# Patient Record
Sex: Male | Born: 1960
Health system: Southern US, Community
[De-identification: ages and names within clinical notes are randomized; demographics above are authoritative.]

## PROBLEM LIST (undated history)

## (undated) DIAGNOSIS — R911 Solitary pulmonary nodule: Secondary | ICD-10-CM

## (undated) DIAGNOSIS — I10 Essential (primary) hypertension: Secondary | ICD-10-CM

## (undated) DIAGNOSIS — I4891 Unspecified atrial fibrillation: Secondary | ICD-10-CM

## (undated) DIAGNOSIS — Z9989 Dependence on other enabling machines and devices: Secondary | ICD-10-CM

## (undated) DIAGNOSIS — I272 Pulmonary hypertension, unspecified: Secondary | ICD-10-CM

## (undated) DIAGNOSIS — R040 Epistaxis: Secondary | ICD-10-CM

## (undated) DIAGNOSIS — U071 COVID-19: Secondary | ICD-10-CM

## (undated) DIAGNOSIS — E119 Type 2 diabetes mellitus without complications: Secondary | ICD-10-CM

## (undated) DIAGNOSIS — J45909 Unspecified asthma, uncomplicated: Secondary | ICD-10-CM

## (undated) DIAGNOSIS — N183 Chronic kidney disease, stage 3 unspecified: Secondary | ICD-10-CM

## (undated) DIAGNOSIS — G4733 Obstructive sleep apnea (adult) (pediatric): Secondary | ICD-10-CM

## (undated) DIAGNOSIS — Z86718 Personal history of other venous thrombosis and embolism: Secondary | ICD-10-CM

## (undated) DIAGNOSIS — J9691 Respiratory failure, unspecified with hypoxia: Secondary | ICD-10-CM

## (undated) DIAGNOSIS — J449 Chronic obstructive pulmonary disease, unspecified: Secondary | ICD-10-CM

## (undated) DIAGNOSIS — I509 Heart failure, unspecified: Secondary | ICD-10-CM

## (undated) HISTORY — DX: Personal history of other venous thrombosis and embolism: Z86.718

## (undated) HISTORY — DX: Obstructive sleep apnea (adult) (pediatric): G47.33

## (undated) HISTORY — DX: Obstructive sleep apnea (adult) (pediatric): Z99.89

## (undated) HISTORY — DX: Respiratory failure, unspecified with hypoxia: J96.91

## (undated) HISTORY — PX: SHOULDER SURGERY: SHX246

## (undated) HISTORY — DX: Chronic kidney disease, stage 3 unspecified: N18.30

## (undated) HISTORY — DX: Type 2 diabetes mellitus without complications: E11.9

## (undated) HISTORY — DX: COVID-19: U07.1

## (undated) HISTORY — DX: Essential (primary) hypertension: I10

## (undated) HISTORY — DX: Epistaxis: R04.0

## (undated) HISTORY — DX: Solitary pulmonary nodule: R91.1

---

## 2016-05-21 LAB — POCT INR: INR: 3 — AB (ref 0.9–1.1)

## 2016-05-21 LAB — PROTIME-INR: PROTIME: 30.1 — AB (ref 10.0–13.8)

## 2017-04-13 ENCOUNTER — Emergency Department (HOSPITAL_COMMUNITY): Payer: Medicare Other

## 2017-04-13 ENCOUNTER — Encounter (HOSPITAL_COMMUNITY): Payer: Self-pay | Admitting: Emergency Medicine

## 2017-04-13 ENCOUNTER — Inpatient Hospital Stay (HOSPITAL_COMMUNITY)
Admission: EM | Admit: 2017-04-13 | Discharge: 2017-04-18 | DRG: 194 | Disposition: A | Discharge status: Home / Self Care | Payer: Medicare Other | Attending: Family Medicine | Admitting: Family Medicine

## 2017-04-13 DIAGNOSIS — E669 Obesity, unspecified: Secondary | ICD-10-CM | POA: Diagnosis present

## 2017-04-13 DIAGNOSIS — I509 Heart failure, unspecified: Secondary | ICD-10-CM | POA: Diagnosis present

## 2017-04-13 DIAGNOSIS — I5040 Unspecified combined systolic (congestive) and diastolic (congestive) heart failure: Secondary | ICD-10-CM

## 2017-04-13 DIAGNOSIS — Z6841 Body Mass Index (BMI) 40.0 and over, adult: Secondary | ICD-10-CM

## 2017-04-13 DIAGNOSIS — I82412 Acute embolism and thrombosis of left femoral vein: Secondary | ICD-10-CM | POA: Diagnosis present

## 2017-04-13 DIAGNOSIS — R0602 Shortness of breath: Secondary | ICD-10-CM | POA: Diagnosis present

## 2017-04-13 DIAGNOSIS — I959 Hypotension, unspecified: Secondary | ICD-10-CM | POA: Diagnosis present

## 2017-04-13 DIAGNOSIS — N179 Acute kidney failure, unspecified: Secondary | ICD-10-CM | POA: Diagnosis present

## 2017-04-13 DIAGNOSIS — I482 Chronic atrial fibrillation: Secondary | ICD-10-CM | POA: Diagnosis present

## 2017-04-13 DIAGNOSIS — J189 Pneumonia, unspecified organism: Principal | ICD-10-CM

## 2017-04-13 DIAGNOSIS — J440 Chronic obstructive pulmonary disease with acute lower respiratory infection: Secondary | ICD-10-CM | POA: Diagnosis present

## 2017-04-13 DIAGNOSIS — I82432 Acute embolism and thrombosis of left popliteal vein: Secondary | ICD-10-CM | POA: Diagnosis present

## 2017-04-13 DIAGNOSIS — I4891 Unspecified atrial fibrillation: Secondary | ICD-10-CM

## 2017-04-13 DIAGNOSIS — Z7901 Long term (current) use of anticoagulants: Secondary | ICD-10-CM

## 2017-04-13 DIAGNOSIS — J9611 Chronic respiratory failure with hypoxia: Secondary | ICD-10-CM

## 2017-04-13 DIAGNOSIS — I272 Pulmonary hypertension, unspecified: Secondary | ICD-10-CM | POA: Diagnosis present

## 2017-04-13 DIAGNOSIS — Z86711 Personal history of pulmonary embolism: Secondary | ICD-10-CM

## 2017-04-13 DIAGNOSIS — N189 Chronic kidney disease, unspecified: Secondary | ICD-10-CM | POA: Diagnosis present

## 2017-04-13 DIAGNOSIS — R911 Solitary pulmonary nodule: Secondary | ICD-10-CM | POA: Diagnosis present

## 2017-04-13 DIAGNOSIS — G4733 Obstructive sleep apnea (adult) (pediatric): Secondary | ICD-10-CM | POA: Diagnosis present

## 2017-04-13 DIAGNOSIS — E1122 Type 2 diabetes mellitus with diabetic chronic kidney disease: Secondary | ICD-10-CM | POA: Diagnosis present

## 2017-04-13 DIAGNOSIS — M79605 Pain in left leg: Secondary | ICD-10-CM

## 2017-04-13 DIAGNOSIS — I130 Hypertensive heart and chronic kidney disease with heart failure and stage 1 through stage 4 chronic kidney disease, or unspecified chronic kidney disease: Secondary | ICD-10-CM | POA: Diagnosis present

## 2017-04-13 DIAGNOSIS — I824Y2 Acute embolism and thrombosis of unspecified deep veins of left proximal lower extremity: Secondary | ICD-10-CM

## 2017-04-13 DIAGNOSIS — Z9981 Dependence on supplemental oxygen: Secondary | ICD-10-CM

## 2017-04-13 DIAGNOSIS — I5032 Chronic diastolic (congestive) heart failure: Secondary | ICD-10-CM | POA: Diagnosis present

## 2017-04-13 HISTORY — DX: Chronic obstructive pulmonary disease, unspecified: J44.9

## 2017-04-13 HISTORY — DX: Type 2 diabetes mellitus without complications: E11.9

## 2017-04-13 HISTORY — DX: Unspecified asthma, uncomplicated: J45.909

## 2017-04-13 HISTORY — DX: Essential (primary) hypertension: I10

## 2017-04-13 HISTORY — DX: Heart failure, unspecified: I50.9

## 2017-04-13 LAB — COMPREHENSIVE METABOLIC PANEL
ALT: 15 U/L — AB (ref 17–63)
AST: 23 U/L (ref 15–41)
Albumin: 4 g/dL (ref 3.5–5.0)
Alkaline Phosphatase: 75 U/L (ref 38–126)
Anion gap: 12 (ref 5–15)
BUN: 21 mg/dL — AB (ref 6–20)
CHLORIDE: 103 mmol/L (ref 101–111)
CO2: 27 mmol/L (ref 22–32)
CREATININE: 1.45 mg/dL — AB (ref 0.61–1.24)
Calcium: 9.1 mg/dL (ref 8.9–10.3)
GFR calc non Af Amer: 52 mL/min — ABNORMAL LOW (ref >60)
Glucose, Bld: 141 mg/dL — ABNORMAL HIGH (ref 65–99)
Potassium: 3.8 mmol/L (ref 3.5–5.1)
SODIUM: 142 mmol/L (ref 135–145)
Total Bilirubin: 1.2 mg/dL (ref 0.3–1.2)
Total Protein: 7.2 g/dL (ref 6.5–8.1)

## 2017-04-13 LAB — CBC WITH DIFFERENTIAL/PLATELET
BASOS ABS: 0.1 10*3/uL (ref 0.0–0.1)
BASOS PCT: 1 %
EOS ABS: 0.2 10*3/uL (ref 0.0–0.7)
EOS PCT: 3 %
HCT: 47.7 % (ref 39.0–52.0)
Hemoglobin: 14.6 g/dL (ref 13.0–17.0)
Lymphocytes Relative: 8 %
Lymphs Abs: 0.5 10*3/uL — ABNORMAL LOW (ref 0.7–4.0)
MCH: 28.2 pg (ref 26.0–34.0)
MCHC: 30.6 g/dL (ref 30.0–36.0)
MCV: 92.1 fL (ref 78.0–100.0)
Monocytes Absolute: 1 10*3/uL (ref 0.1–1.0)
Monocytes Relative: 17 %
Neutro Abs: 4.1 10*3/uL (ref 1.7–7.7)
Neutrophils Relative %: 71 %
PLATELETS: 193 10*3/uL (ref 150–400)
RBC: 5.18 MIL/uL (ref 4.22–5.81)
RDW: 16.2 % — ABNORMAL HIGH (ref 11.5–15.5)
WBC: 5.7 10*3/uL (ref 4.0–10.5)

## 2017-04-13 LAB — TROPONIN I

## 2017-04-13 LAB — BRAIN NATRIURETIC PEPTIDE: B Natriuretic Peptide: 274 pg/mL — ABNORMAL HIGH (ref 0.0–100.0)

## 2017-04-13 MED ORDER — DEXTROSE 5 % IV SOLN
500.0000 mg | Freq: Once | INTRAVENOUS | Status: AC
  Administered 2017-04-13: 500 mg via INTRAVENOUS
  Filled 2017-04-13: qty 500

## 2017-04-13 MED ORDER — ALBUTEROL SULFATE (2.5 MG/3ML) 0.083% IN NEBU: 5.0000 mg | INHALATION_SOLUTION | Freq: Once | RESPIRATORY_TRACT | Status: DC

## 2017-04-13 MED ORDER — DEXTROSE 5 % IV SOLN
1.0000 g | Freq: Once | INTRAVENOUS | Status: AC
  Administered 2017-04-13: 1 g via INTRAVENOUS
  Filled 2017-04-13: qty 10

## 2017-04-13 MED ORDER — IOPAMIDOL (ISOVUE-370) INJECTION 76%
INTRAVENOUS | Status: AC
  Administered 2017-04-13: 100 mL
  Filled 2017-04-13: qty 100

## 2017-04-13 MED ORDER — FUROSEMIDE 10 MG/ML IJ SOLN
40.0000 mg | INTRAMUSCULAR | Status: AC
  Administered 2017-04-13: 40 mg via INTRAVENOUS
  Filled 2017-04-13: qty 4

## 2017-04-13 NOTE — ED Provider Notes (Signed)
[facility] DEPT Provider Note   CSN: [id number] Arrival date & time: 04/13/17  1647     History   Chief Complaint Chief Complaint  Patient presents with  . Shortness of Breath  . Leg Pain    HPI Roberto [name] is a 56 y.o. male.  The history is provided by the patient and medical records.  Shortness of Breath  Associated symptoms include leg pain and leg swelling.  Leg Pain      56 year old male with history of CHF, COPD, diabetes, hypertension, history of DVT, presenting to the ED for shortness of breath. Patient uses home oxygen at baseline, 5 L at all times.  States over the past 3 days he has had increased shortness of breath and some pain in his left leg. States pain in posterior thigh and shooting down into the calf. States leg has been mildly red and warm to touch. He does report history of DVT in the leg. Unfortunately due to financial circumstances he has been off of his overall toe for about 9 months. States his prior insurance would not cover this medication and it was too expensive to pay for out of pocket. States he is now changed insurances, but his prescription has lapsed. He denies any chest pain. No fever but has had some chills/sweats at night.  No cough or other sick contacts. States all [place] were [place] in [place] but he has been living in [place] for about a month now since his daughter just had a baby.  Past Medical History:  Diagnosis Date  . CHF (congestive heart failure) ([place])   . COPD (chronic obstructive pulmonary disease) ([place])   . Diabetes mellitus without complication ([place])   . Hypertension     There are no active problems to display for this patient.   Past Surgical History:  Procedure Laterality Date  . SHOULDER SURGERY         Home Medications    Prior to Admission medications   Not on File    Family History No family history on file.  Social History Social History  Substance Use Topics  . Smoking  status: Never Smoker  . Smokeless tobacco: Never Used  . Alcohol use No     Allergies   Patient has no allergy information on record.   Review of Systems Review of Systems  Respiratory: Positive for shortness of breath.   Cardiovascular: Positive for leg swelling.  All other systems reviewed and are negative.    Physical Exam Updated Vital Signs BP 134/80 (BP Location: Left Arm)   Pulse 83   Temp 98.2 F (36.8 C) (Oral)   Resp 18   Ht 5' 9" (1.753 m)   Wt (!) 176.9 kg (390 lb)   SpO2 (!) 88%   BMI 57.59 kg/m   Physical Exam  Constitutional: He is oriented to person, place, and time. He appears well-developed and well-nourished.  obese  HENT:  Head: Normocephalic and atraumatic.  Mouth/Throat: Oropharynx is clear and moist.  Eyes: Pupils are equal, round, and reactive to light. Conjunctivae and EOM are normal.  Neck: Normal range of motion.  Cardiovascular: Normal rate, regular rhythm and normal heart sounds.   Pulmonary/Chest: Effort normal and breath sounds normal. No respiratory distress. He has no wheezes.  5L via [place] in place, O2 sats 93% during exam, speaking in sentences, no significant distress noted  Abdominal: Soft. Bowel sounds are normal. There is no tenderness. There is no rebound.  Musculoskeletal: Normal  range of motion.  Compression hose in place; edema noted of the lower legs; tenderness along posterior thigh  Neurological: He is alert and oriented to person, place, and time.  Skin: Skin is warm and dry.  Psychiatric: He has a normal mood and affect.  Nursing note and vitals reviewed.    ED Treatments / Results  Labs (all labs ordered are listed, but only abnormal results are displayed) Labs Reviewed  CBC WITH DIFFERENTIAL/PLATELET - Abnormal; Notable for the following:       Result Value   RDW 16.2 (*)    Lymphs Abs 0.5 (*)    All other components within normal limits  COMPREHENSIVE METABOLIC PANEL - Abnormal; Notable for the following:     Glucose, Bld 141 (*)    BUN 21 (*)    Creatinine, Ser 1.45 (*)    ALT 15 (*)    GFR calc non Af Amer 52 (*)    All other components within normal limits  BRAIN NATRIURETIC PEPTIDE - Abnormal; Notable for the following:    B Natriuretic Peptide 274.0 (*)    All other components within normal limits  TROPONIN I    EKG  EKG Interpretation None       Radiology Dg Chest 2 View  Result Date: 04/13/2017 CLINICAL DATA:  Shortness of breath. EXAM: CHEST  2 VIEW COMPARISON:  None. FINDINGS: The cardio pericardial silhouette is enlarged. There is pulmonary vascular congestion without overt pulmonary edema although a component of dependent interstitial edema towards the bases is suspected. Lateral film limited by underpenetration. No pleural effusion. The visualized bony structures of the thorax are intact. IMPRESSION: Enlargement of the cardiopericardial silhouette with vascular congestion and potential interstitial pulmonary edema. Electronically Signed   By: Misty Stanley M.D.   On: 04/13/2017 18:49   Ct Angio Chest Pe W And/or Wo Contrast  Result Date: 04/13/2017 CLINICAL DATA:  Shortness of breath x2 days, history of DVT, off Xarelto EXAM: CT ANGIOGRAPHY CHEST WITH CONTRAST TECHNIQUE: Multidetector CT imaging of the chest was performed using the standard protocol during bolus administration of intravenous contrast. Multiplanar CT image reconstructions and MIPs were obtained to evaluate the vascular anatomy. CONTRAST:  120 mL Isovue 370 IV COMPARISON:  Chest radiographs dated 04/13/2017 FINDINGS: Cardiovascular: Suboptimal contrast opacification of the bilateral pulmonary arteries. Initial scan is not diagnostic. Repeat evaluation is notable for adequate contrast opacification to the level of the lobar pulmonary arteries. No evidence of central or lobar pulmonary embolism. Segmental or subsegmental pulmonary embolism cannot be excluded. Cardiomegaly.  Small pericardial effusion. Mild enlargement of  the main pulmonary artery, suggesting pulmonary arterial hypertension. No evidence of thoracic aortic aneurysm or dissection. Mediastinum/Nodes: Small mediastinal lymph nodes measuring up to 18 mm short axis (series 4/ image 20), likely reactive. Visualized thyroid is unremarkable. Lungs/Pleura: Patchy opacity in the posterior right upper lobe (series 16/ image 41), possibly reflecting mild pneumonia, less likely mosaic attenuation. Mild dependent atelectasis in the posterior left lower lobe. 7 mm right lower lobe pulmonary nodule (series 16/ image 67). No pleural effusion or pneumothorax. Upper Abdomen: Visualized upper abdomen is notable for mild hepatic steatosis. Musculoskeletal: Degenerative changes of the lower thoracic spine. Review of the MIP images confirms the above findings. IMPRESSION: Limited evaluation with suboptimal contrast opacification of the bilateral pulmonary arteries. No evidence of central or lobar pulmonary embolism. Segmental or subsegmental pulmonary embolism cannot be excluded. Mild patchy opacity in the posterior right upper lobe, possibly reflecting mild pneumonia. 7 mm right lower  lobe pulmonary nodule. Non-contrast chest CT at 6-12 months is recommended. If the nodule is stable at time of repeat CT, then future CT at 18-24 months (from today's scan) is considered optional for low-risk patients, but is recommended for high-risk patients. This recommendation follows the consensus statement: Guidelines for Management of Incidental Pulmonary Nodules Detected on CT Images: From the Fleischner Society 2017; Radiology 2017; 284:228-243. Cardiomegaly. Small pericardial effusion. Suspected pulmonary arterial hypertension. Electronically Signed   By: Julian Hy M.D.   On: 04/13/2017 21:38    Procedures Procedures (including critical care time)  Medications Ordered in ED Medications  albuterol (PROVENTIL) (2.5 MG/3ML) 0.083% nebulizer solution 5 mg (5 mg Nebulization Not Given  04/13/17 2314)  furosemide (LASIX) injection 40 mg (not administered)  cefTRIAXone (ROCEPHIN) 1 g in dextrose 5 % 50 mL IVPB (not administered)  azithromycin (ZITHROMAX) 500 mg in dextrose 5 % 250 mL IVPB (not administered)  iopamidol (ISOVUE-370) 76 % injection (100 mLs  Contrast Given 04/13/17 2027)     Initial Impression / Assessment and Plan / ED Course  I have reviewed the triage vital signs and the nursing notes.  Pertinent labs & imaging results that were available during my care of the patient were reviewed by me and considered in my medical decision making (see chart for details).  56 y.o. M here with increased SOB.  Uses 5L O2 at baseline.  Hx of DVT, off xarelto for about 9 months due to finances.  Also reports some left leg pain.  No recent illness, cough, fever, chills.  On exam he is obese and does appear to have some significant peripheral edema.  He does wear compression stockings daily.  No significant respiratory distress.  O2 around 93% on his baseline 5L.  Screening labs overall reassuring.  CXR with vascular congestion and enlargement of cardiac silhouette.  Given he has been off his anticoagulation for several months, obvious concern for PE causing CHF vs primary CHF.  Will add on BNP, troponin.  Plan for CTA chest.  CTA negative for l;arge PE but study somewhat limited.  There is infiltrate seen concerning for pneumonia-- patient has had some chills and sweats at home. On reassessment patient's oxygen saturation still only 93% on his baseline 5 L just sitting in bed. Given his CHF and questionable pneumonia, feel he would benefit from diuresis and hospitalization. He was ordered additional Lasix and antibiotics for possible CAP He continues to have some left leg pain, unable to obtain venous duplex so will arrange for this in the morning.  Discussed with family practice service-- they will admit.  Final Clinical Impressions(s) / ED Diagnoses   Final diagnoses:  Shortness of  breath  Acute on chronic congestive heart failure, unspecified heart failure type ([place])  Left leg pain    New Prescriptions New Prescriptions   No medications on file     Kathryne Hitch 04/13/17 2323    Lajean Saver, MD 04/13/17 [phone]

## 2017-04-13 NOTE — ED Triage Notes (Signed)
Pt reports LLE pain x 3 days, reports area warm to touch and tender.  Pt reports new SOB this morning, denies cough or CP.  Pt reports hx DVT, reports being off Xarelto x 9 months, states, "I just couldn't afford it." Pt reports living on 5L O2.

## 2017-04-13 NOTE — H&P (Signed)
[facility] Admission History and Physical Service Pager: [phone]  Patient name: Roberto [name] Medical record number: [id number] Date of birth: 01-02-1961 Age: 56 y.o. Gender: male  Primary Care Provider: Dhivianathan, Candida Peeling, MD Consultants: none Code Status: Full (obtained in admission)  Chief Complaint: shortness of breath and leg pain  Assessment and Plan: Roberto [name] is a 56 y.o. male presenting with one day history of shortness of breath. Laboratory findings and imaging consistent shortness of breath being caused by chf exacerbation. Given patients leg pain and history of dvts, a small PE undetectably by CTA is also on the differential. Other potential causes include pneumonia, copd exacerbation, and viral uri. These are all unlikely due to low wbc, afebrile, lack of physical exam findings, or time course inconsistent with each diagnosis.  PMH is significant for asthma, chf, copd, t2dm, osa, dvt in left leg, afib, htn  Shortness of breath likely secondary to CHF exacerbation given CXR, elevated BNP (even with obesity) and improvement of symptoms with IV lasix. Admits dietary indiscretion.  Patient is back to baseline on 5L [place]. Patient with diffuse cardiopulmonary congestion on ct and cxr. Fluid in fissures noted bilaterally along with characteristic kerly b lines. Patient with some improvement after receiving one dose 73m IV lasix. Will watch urine output with strict I/os and daily weights while in hospital. Patient formerly took spironolactone but was taken off of this medication in recent past for unknown reasons. Other differential items to consider are pneumonia, PE, copd exacerbation/asthma exacerbation, viral uri, afib. Given patient's wbc of 5 and normal temps while in the ed it is unlikely that pneumonia or URI is causing his symptoms. Given leg pain, history of PE and patient stopping anticoagulant PE is certainly a consideration. Given ct scan  with no evidence of PE this diagnosis is less likely but still a consideration. Patient with afib on ekg, it is possible this could have been causing his sob earlier. Given resolution of symptoms with patient still in afib, this is less likely. Patient with no wheezing on exam which makes asthma and copd unlikely. - admit to telemetry for observation to dr. EGwendlyn Deutscher- vitals per  floor - Monitor respiratory status - continue home o2 - strict I/O, monitor daily weights - continue home torsemide in am - BLE duplex ultrasound in am - echocardiogram in am - pt/ot eval and treat - continue home amlodipine in the morning - heparin gtt for afib, possible blood clot, pharmacy to dose - received one dose rocephin/azithromax.  - cbc, bmp in am - Repeat EKG in am  Abnormal EKG: Afib, RAD, TWI in precordial and inferior leads. No prior EKG to compare to. Patient without chest pain but dyspnea. Initial trop negative. -Cycle trop to rule out ACS -Morning EKG -A. Fib as below  Atrial fibrillation w/o RVR: chad-vasc score 6. Not on rate or rhythm control. Previously on Xarelto but not sure if it was for this or DVT or both. Denies history of GI bleed. Patient with afib on ekg, and possible explanation for sob. On heparin gtt while in hospital. Will plan to start xarelto on discharge,  possibly in the morning. Will continue to monitor vitals. If indicated will start medication to rate control. - Cardiac monitor - holding home meds - heparin gtt - monitor vitals  CHF: Patient's shortness of breath thought to be due to chf exacerbation. Received 1 dose iv lasix. Some crackles over RLL. Will likely restart home diuretic in am. Echo  in am to determine EF and fluid status. Not sure about dry weight - strict I/O - continue home o2 - daily weights - Heart healthy/diabetic diet diet - Echo in am  Left Leg pain: no calf tenderness on exam. CTA without massive PE.  - prn tylenol for pain - BLE venous duplex  in am - echo in am - pt/ot to ambulate - Heparin gtt, pharmacy to dose  T2DM: Unknown A1c. Glucose of 141 on admission. Will monitor q achs blood glucose. Will hold home metformin and glyburide. Start on moderate ssi - moderate ssi - blood glucose q ac/hs - obtain hgb a1c  ?Pnemonia: had chills and diaphoresis but no fever. Exam with some crackles over RLL posteriorly. CTA with mild pneumonia in RLL. Received CTX and Azithromycin in ED. -Switch to Levaquin  Asthma/copd: Patient takes no medication at home. Never smoker. Will continue to monitor respiratory status. Possibly start albuterol of symptomatic.  -PRN dounebs  Hypertension Patient with diagnosis of htn. Normotensive since admission. Last bp 139/88. Will hold home blood pressure meds at current time. Received one dose of iv lasix.  OSA/?OHS Uses cpap at night. Will give cpap while in hospital  H/O DVT Will receive echo and BLE duplex. Will plan to restart xarelto on discharge, possibly in the morning, as patient's insurance can apparently now pay for this medication  Lung nodule: about 7 mm solid appearing nodule noted in right lung on CTA. No further characterization. Follow up CT at 18-24 mos recommended.   FEN/GI: heart healthy/diabetic diet  Prophylaxis: on heparin gtt  Disposition: home  History of Present Illness:  Roberto [name] is a 56 y.o. male presenting with shortness of breath and left leg pain. Per the patient he started having progressive shortness of breath beginning on the morning of 7/20. He said that he would have increased work of breathing doing routine tasks such as walking from the parking lot into a building. He is usually on 5L [place] at home, and states that this amount wasn't allowing him to breath normally. He is only short of breath while performing activity and returns to baseline when resting. He states that he usually requires a couple of pillows to sleep at night, and if he doesn't use these he  sometimes wakes up short of breath. He has chronic non-productive cough and states that he has been coughing out clear phlegm over the last few days.  Patient states that he has been having progressive left leg pain for the last 3-4 days. He describes the pain as "just an annoying pain". The pain was initially relieved with aleve but became progressively resistant to NSAIDS. He states that the pain is present when he is at rest, but that it moves in location when he moves around. It is normally located in his mid-thigh of his left leg. It moves to a radiating type pain from his inguinal area to his left knee when standing. Of note the patient has a history of DVTs and he feels like this pain is similar to that. He was supposed to be on xarelto for life, but stopped taking it around 9 months ago due to his insurance not covering it. He took aspirin 653m 2 days ago because he was concerned about dvt pain.  Other notable history is some profound night sweats. He states over the last 3-4 nights he has been soaking through his sheets, but notes that he is always very hot at night. He had one episode 2  nights ago when he started shaking, and had very heaving sweating at night. He took an aleve which alleviated his symptoms. Workup thus far includes ekg in er which showed afib, ct scan which revealed no large PE in lungs, chest xray consistent with fluid overnight, bnp of 274, trop <.03. He had a bmp which was notable for cr 1.45, glucose 141. A cbc was obtained which was normal with wbc 5.7, hgb 14.6. He believes that his breathing has returned to baseline. Received prevnar vaccine in September. He uses cpap at night for osa. He has had some subjectively decreased urination recently. He does not take any medications for his copd. Patient received 1 dose of rocephin and azithromycin in ed. He received 1 dose 34m IV lasix in ed.  Review Of Systems: Per HPI with the following additions:  Review of Systems   Constitutional: Positive for chills and diaphoresis. Negative for fever and weight loss.  HENT: Negative for sore throat.   Eyes: Negative for blurred vision, photophobia and pain.  Respiratory: Positive for cough and shortness of breath.   Cardiovascular: Negative for chest pain, palpitations and leg swelling.  Gastrointestinal: Negative for abdominal pain, blood in stool and vomiting.  Genitourinary: Negative for dysuria.  Musculoskeletal: Negative for myalgias.  Skin: Negative for rash.  Neurological: Negative for speech change, focal weakness, weakness and headaches.  Endo/Heme/Allergies: Does not bruise/bleed easily.  Psychiatric/Behavioral: Negative for depression and substance abuse. The patient is not nervous/anxious.     There are no active problems to display for this patient.   Past Medical History: Past Medical History:  Diagnosis Date  . Asthma   . CHF (congestive heart failure) ([place]   . COPD (chronic obstructive pulmonary disease) ([facility] [facility]   . Diabetes mellitus without complication ([facility]   . Hypertension   Atrial fibrillation. Had cardioversion few years ago.  OSA DVT  Past Surgical History: Past Surgical History:  Procedure Laterality Date  . SHOULDER SURGERY      Social History: Social History  Substance Use Topics  . Smoking status: Never Smoker  . Smokeless tobacco: Never Used  . Alcohol use No  Denies drug use.  Additional social history[place] with [place] daughter.  Please also refer to relevant sections of EMR.  Family History: Younger brother died of breathing issue Mother from cancer.   Allergies and Medications: No Known Allergies No current facility-administered medications on file prior to encounter.    No current outpatient prescriptions on file prior to encounter.    Objective: BP 114/87   Pulse 78   Temp 98.2 F (36.8 C) (Oral)   Resp (!) 21   Ht _0  (1.753 m)   Wt (!) 390 lb (176.9 kg)    SpO2 92%   BMI 57.59 kg/m  Exam: General: no acute distress, at edge of bed, able to converse with no difficulty Eyes: eomi, perrl, no jaundice ENTM: good dentition, no signs of trauma Neck: supple, no adenopathy Cardiovascular:  low to normal rate, irregular rythm, regular rate, regular rhythm by auscultation. Palpable bilateral radial pulses. Unable to palpate BLE pulses due to body habitus/edema/lipidema Respiratory: on 5L by [place], speaks in full sentences,  no IWOB,  lungs clear to auscultation bilaterally. No wheezes. No chest pain. Crackles over RLL posteriorly Gastrointestinal: soft, non-distended, non-tender. Morbid obesity MSK: able to move all four extremities. 5/5 muscle strength all groups BUE/BLE. No Calf tenderness.  Derm: skin changes consistent with chronic venous  stasis BLE & lipidemia. No increased warmth to touch.  Neuro: aox4, no neuro deficits noted Psych: appropriate  Labs and Imaging: CBC BMET   Recent Labs Lab 04/13/17 1701  WBC 5.7  HGB 14.6  HCT 47.7  PLT 193    Recent Labs Lab 04/13/17 1701  NA 142  K 3.8  CL 103  CO2 27  BUN 21*  CREATININE 1.45*  GLUCOSE 141*  CALCIUM 9.1     Dg Chest 2 View  Result Date: 04/13/2017 CLINICAL DATA:  Shortness of breath. EXAM: CHEST  2 VIEW COMPARISON:  None. FINDINGS: The cardio pericardial silhouette is enlarged. There is pulmonary vascular congestion without overt pulmonary edema although a component of dependent interstitial edema towards the bases is suspected. Lateral film limited by underpenetration. No pleural effusion. The visualized bony structures of the thorax are intact. IMPRESSION: Enlargement of the cardiopericardial silhouette with vascular congestion and potential interstitial pulmonary edema. Electronically Signed   By: Misty Stanley M.D.   On: 04/13/2017 18:49   Ct Angio Chest Pe W And/or Wo Contrast  Result Date: 04/13/2017 CLINICAL DATA:  Shortness of breath x2 days, history of DVT, off  Xarelto EXAM: CT ANGIOGRAPHY CHEST WITH CONTRAST TECHNIQUE: Multidetector CT imaging of the chest was performed using the standard protocol during bolus administration of intravenous contrast. Multiplanar CT image reconstructions and MIPs were obtained to evaluate the vascular anatomy. CONTRAST:  120 mL Isovue 370 IV COMPARISON:  Chest radiographs dated 04/13/2017 FINDINGS: Cardiovascular: Suboptimal contrast opacification of the bilateral pulmonary arteries. Initial scan is not diagnostic. Repeat evaluation is notable for adequate contrast opacification to the level of the lobar pulmonary arteries. No evidence of central or lobar pulmonary embolism. Segmental or subsegmental pulmonary embolism cannot be excluded. Cardiomegaly.  Small pericardial effusion. Mild enlargement of the main pulmonary artery, suggesting pulmonary arterial hypertension. No evidence of thoracic aortic aneurysm or dissection. Mediastinum/Nodes: Small mediastinal lymph nodes measuring up to 18 mm short axis (series 4/ image 20), likely reactive. Visualized thyroid is unremarkable. Lungs/Pleura: Patchy opacity in the posterior right upper lobe (series 16/ image 41), possibly reflecting mild pneumonia, less likely mosaic attenuation. Mild dependent atelectasis in the posterior left lower lobe. 7 mm right lower lobe pulmonary nodule (series 16/ image 67). No pleural effusion or pneumothorax. Upper Abdomen: Visualized upper abdomen is notable for mild hepatic steatosis. Musculoskeletal: Degenerative changes of the lower thoracic spine. Review of the MIP images confirms the above findings. IMPRESSION: Limited evaluation with suboptimal contrast opacification of the bilateral pulmonary arteries. No evidence of central or lobar pulmonary embolism. Segmental or subsegmental pulmonary embolism cannot be excluded. Mild patchy opacity in the posterior right upper lobe, possibly reflecting mild pneumonia. 7 mm right lower lobe pulmonary nodule.  Non-contrast chest CT at 6-12 months is recommended. If the nodule is stable at time of repeat CT, then future CT at 18-24 months (from today's scan) is considered optional for low-risk patients, but is recommended for high-risk patients. This recommendation follows the consensus statement: Guidelines for Management of Incidental Pulmonary Nodules Detected on CT Images: From the Fleischner Society 2017; Radiology 2017; 284:228-243. Cardiomegaly. Small pericardial effusion. Suspected pulmonary arterial hypertension. Electronically Signed   By: Julian Hy M.D.   On: 04/13/2017 21:38    Guadalupe Dawn MD 04/13/2017, 10:37 PM PGY-1, [place] Intern pager: [phone], text pages welcome  I have seen and evaluated the patient with Dr. Kris Mouton. I am in agreement with the note above in its revised form. My  additions are in red.  Wendee Beavers, MD, PGY-2 04/14/2017 1:41 AM

## 2017-04-13 NOTE — ED Notes (Signed)
Nurse currently starting IV and will draw labs.

## 2017-04-14 ENCOUNTER — Other Ambulatory Visit: Payer: Self-pay

## 2017-04-14 ENCOUNTER — Observation Stay (HOSPITAL_COMMUNITY): Payer: Medicare Other

## 2017-04-14 DIAGNOSIS — I509 Heart failure, unspecified: Secondary | ICD-10-CM | POA: Diagnosis present

## 2017-04-14 DIAGNOSIS — M79605 Pain in left leg: Secondary | ICD-10-CM

## 2017-04-14 DIAGNOSIS — I482 Chronic atrial fibrillation: Secondary | ICD-10-CM | POA: Diagnosis present

## 2017-04-14 DIAGNOSIS — Z6841 Body Mass Index (BMI) 40.0 and over, adult: Secondary | ICD-10-CM | POA: Diagnosis not present

## 2017-04-14 DIAGNOSIS — R0602 Shortness of breath: Secondary | ICD-10-CM | POA: Diagnosis present

## 2017-04-14 DIAGNOSIS — I272 Pulmonary hypertension, unspecified: Secondary | ICD-10-CM | POA: Diagnosis present

## 2017-04-14 DIAGNOSIS — J9611 Chronic respiratory failure with hypoxia: Secondary | ICD-10-CM | POA: Diagnosis present

## 2017-04-14 DIAGNOSIS — Z7901 Long term (current) use of anticoagulants: Secondary | ICD-10-CM | POA: Diagnosis not present

## 2017-04-14 DIAGNOSIS — R911 Solitary pulmonary nodule: Secondary | ICD-10-CM | POA: Diagnosis present

## 2017-04-14 DIAGNOSIS — Z86711 Personal history of pulmonary embolism: Secondary | ICD-10-CM | POA: Diagnosis not present

## 2017-04-14 DIAGNOSIS — N179 Acute kidney failure, unspecified: Secondary | ICD-10-CM | POA: Diagnosis present

## 2017-04-14 DIAGNOSIS — I4891 Unspecified atrial fibrillation: Secondary | ICD-10-CM

## 2017-04-14 DIAGNOSIS — N189 Chronic kidney disease, unspecified: Secondary | ICD-10-CM | POA: Diagnosis present

## 2017-04-14 DIAGNOSIS — I5040 Unspecified combined systolic (congestive) and diastolic (congestive) heart failure: Secondary | ICD-10-CM

## 2017-04-14 DIAGNOSIS — Z86718 Personal history of other venous thrombosis and embolism: Secondary | ICD-10-CM | POA: Diagnosis not present

## 2017-04-14 DIAGNOSIS — M79609 Pain in unspecified limb: Secondary | ICD-10-CM

## 2017-04-14 DIAGNOSIS — I824Y2 Acute embolism and thrombosis of unspecified deep veins of left proximal lower extremity: Secondary | ICD-10-CM | POA: Diagnosis not present

## 2017-04-14 DIAGNOSIS — I959 Hypotension, unspecified: Secondary | ICD-10-CM | POA: Diagnosis present

## 2017-04-14 DIAGNOSIS — Z9981 Dependence on supplemental oxygen: Secondary | ICD-10-CM | POA: Diagnosis not present

## 2017-04-14 DIAGNOSIS — G4733 Obstructive sleep apnea (adult) (pediatric): Secondary | ICD-10-CM | POA: Diagnosis present

## 2017-04-14 DIAGNOSIS — I130 Hypertensive heart and chronic kidney disease with heart failure and stage 1 through stage 4 chronic kidney disease, or unspecified chronic kidney disease: Secondary | ICD-10-CM | POA: Diagnosis present

## 2017-04-14 DIAGNOSIS — J440 Chronic obstructive pulmonary disease with acute lower respiratory infection: Secondary | ICD-10-CM | POA: Diagnosis present

## 2017-04-14 DIAGNOSIS — J189 Pneumonia, unspecified organism: Principal | ICD-10-CM

## 2017-04-14 DIAGNOSIS — E669 Obesity, unspecified: Secondary | ICD-10-CM | POA: Diagnosis present

## 2017-04-14 DIAGNOSIS — E1122 Type 2 diabetes mellitus with diabetic chronic kidney disease: Secondary | ICD-10-CM | POA: Diagnosis present

## 2017-04-14 DIAGNOSIS — I5032 Chronic diastolic (congestive) heart failure: Secondary | ICD-10-CM | POA: Diagnosis present

## 2017-04-14 DIAGNOSIS — I361 Nonrheumatic tricuspid (valve) insufficiency: Secondary | ICD-10-CM | POA: Diagnosis not present

## 2017-04-14 DIAGNOSIS — I82412 Acute embolism and thrombosis of left femoral vein: Secondary | ICD-10-CM | POA: Diagnosis present

## 2017-04-14 DIAGNOSIS — I82432 Acute embolism and thrombosis of left popliteal vein: Secondary | ICD-10-CM | POA: Diagnosis present

## 2017-04-14 LAB — GLUCOSE, CAPILLARY
GLUCOSE-CAPILLARY: 100 mg/dL — AB (ref 65–99)
GLUCOSE-CAPILLARY: 125 mg/dL — AB (ref 65–99)
Glucose-Capillary: 122 mg/dL — ABNORMAL HIGH (ref 65–99)
Glucose-Capillary: 115 mg/dL — ABNORMAL HIGH (ref 65–99)
Glucose-Capillary: 106 mg/dL — ABNORMAL HIGH (ref 65–99)

## 2017-04-14 LAB — CBC
HCT: 43.9 % (ref 39.0–52.0)
Hemoglobin: 13.5 g/dL (ref 13.0–17.0)
MCH: 28.4 pg (ref 26.0–34.0)
MCHC: 30.8 g/dL (ref 30.0–36.0)
MCV: 92.2 fL (ref 78.0–100.0)
PLATELETS: 191 10*3/uL (ref 150–400)
RBC: 4.76 MIL/uL (ref 4.22–5.81)
RDW: 16.3 % — ABNORMAL HIGH (ref 11.5–15.5)
WBC: 5.4 10*3/uL (ref 4.0–10.5)

## 2017-04-14 LAB — TROPONIN I
Troponin I: <0.03 ng/mL (ref <0.03)
Troponin I: <0.03 ng/mL (ref <0.03)
Troponin I: <0.03 ng/mL (ref <0.03)

## 2017-04-14 LAB — BASIC METABOLIC PANEL
ANION GAP: 11 (ref 5–15)
BUN: 20 mg/dL (ref 6–20)
CHLORIDE: 99 mmol/L — AB (ref 101–111)
CO2: 30 mmol/L (ref 22–32)
Calcium: 8.7 mg/dL — ABNORMAL LOW (ref 8.9–10.3)
Creatinine, Ser: 1.39 mg/dL — ABNORMAL HIGH (ref 0.61–1.24)
GFR calc Af Amer: >60 mL/min (ref >60)
GFR, EST NON AFRICAN AMERICAN: 55 mL/min — AB (ref >60)
GLUCOSE: 114 mg/dL — AB (ref 65–99)
POTASSIUM: 3.8 mmol/L (ref 3.5–5.1)
Sodium: 140 mmol/L (ref 135–145)

## 2017-04-14 LAB — HEPARIN LEVEL (UNFRACTIONATED): Heparin Unfractionated: 0.25 IU/mL — ABNORMAL LOW (ref 0.30–0.70)

## 2017-04-14 LAB — TSH: TSH: 1.523 u[IU]/mL (ref 0.350–4.500)

## 2017-04-14 MED ORDER — HEPARIN BOLUS VIA INFUSION
6000.0000 [IU] | Freq: Once | INTRAVENOUS | Status: AC
  Administered 2017-04-14: 6000 [IU] via INTRAVENOUS
  Filled 2017-04-14: qty 6000

## 2017-04-14 MED ORDER — TRAMADOL HCL 50 MG PO TABS
50.0000 mg | ORAL_TABLET | Freq: Four times a day (QID) | ORAL | Status: DC | PRN
  Administered 2017-04-14 – 2017-04-18 (×7): 50 mg via ORAL
  Filled 2017-04-14 (×7): qty 1

## 2017-04-14 MED ORDER — INSULIN ASPART 100 UNIT/ML ~~LOC~~ SOLN
0.0000 [IU] | Freq: Three times a day (TID) | SUBCUTANEOUS | Status: DC
  Administered 2017-04-16 – 2017-04-18 (×3): 2 [IU] via SUBCUTANEOUS

## 2017-04-14 MED ORDER — ACETAMINOPHEN 325 MG PO TABS
650.0000 mg | ORAL_TABLET | Freq: Four times a day (QID) | ORAL | Status: DC | PRN
  Administered 2017-04-14 – 2017-04-18 (×4): 650 mg via ORAL
  Filled 2017-04-14 (×4): qty 2

## 2017-04-14 MED ORDER — RIVAROXABAN 20 MG PO TABS
20.0000 mg | ORAL_TABLET | Freq: Every day | ORAL | Status: DC
  Administered 2017-04-14: 20 mg via ORAL
  Filled 2017-04-14: qty 1

## 2017-04-14 MED ORDER — AZITHROMYCIN 250 MG PO TABS
250.0000 mg | ORAL_TABLET | Freq: Every day | ORAL | Status: DC
  Administered 2017-04-14 – 2017-04-17 (×4): 250 mg via ORAL
  Filled 2017-04-14 (×4): qty 1

## 2017-04-14 MED ORDER — TORSEMIDE 20 MG PO TABS
40.0000 mg | ORAL_TABLET | Freq: Every day | ORAL | Status: DC
  Administered 2017-04-14 – 2017-04-17 (×4): 40 mg via ORAL
  Filled 2017-04-14 (×5): qty 2

## 2017-04-14 MED ORDER — IPRATROPIUM-ALBUTEROL 0.5-2.5 (3) MG/3ML IN SOLN
3.0000 mL | RESPIRATORY_TRACT | Status: DC | PRN
  Administered 2017-04-16: 3 mL via RESPIRATORY_TRACT
  Filled 2017-04-14: qty 3

## 2017-04-14 MED ORDER — ONDANSETRON HCL 4 MG/2ML IJ SOLN: 4.0000 mg | Freq: Four times a day (QID) | INTRAMUSCULAR | Status: DC | PRN

## 2017-04-14 MED ORDER — HEPARIN (PORCINE) IN NACL 100-0.45 UNIT/ML-% IJ SOLN
1600.0000 [IU]/h | INTRAMUSCULAR | Status: DC
  Administered 2017-04-14: 1600 [IU]/h via INTRAVENOUS
  Filled 2017-04-14: qty 250

## 2017-04-14 NOTE — Procedures (Signed)
Placed patient on CPAP for the night.  Patient is tolerating well at this time. 

## 2017-04-14 NOTE — Progress Notes (Signed)
Family Medicine Teaching Service Daily Progress Note Intern Pager: [phone]  Patient name: Roberto [name] Medical record number: [id number] Date of birth: [birth year] Age: 56 y.o. Gender: male  Primary Care Provider: Dhivianathan, Candida Peeling, MD Consultants: none Code Status: full  Pt Overview and Major Events to Date:  07/20: admitted with SOB and leg pain  Assessment and Plan: Roberto [name] is a 56 y.o. male  admitted with dyspnea and left leg pain. PMH is significant for asthma/copd, chf, copd, t2dm, osa, dvt in left leg, afib, htn  Dyspnea: Improved. Dyspnea is mainly exertional likely due to CHF exacerbation given CXR, elevated BNP (even with obesity) and improvement of symptoms with IV lasix. Admits dietary indiscretion. About 2 L urine output overnight after a dose of IV Lasix.  - Resume home torsemide - strict I/O, monitor daily weights - echocardiogram  - hold home lisinopril - Follow-up cbc, bmp - Follow-up repeat EKG - Continue cardiac monitor - Continue home oxygen (5 L)  CHF: As above  Abnormal EKG: Afib, RAD, TWI in precordial and inferior leads. No prior EKG to compare to. Patient without chest pain but dyspnea. Troponin negative x2 -Cycle trop to rule out ACS -Follow-up repeat EKG -A. Fib as below  Atrial fibrillation w/o RVR: chad-vasc score 6. Not on rate or rhythm control. Previously on Xarelto  Denies history of GI bleed.  - Switch to Xarelto today - Cardiac monitor - monitor vitals - Not sure if he tolerates beta blocker for rate control  Pneumonia: had chills and diaphoresis but no fever. Exam with some crackles over RLL posteriorly. CTA with mild pneumonia in RLL. Received CTX and azithromycin ED. -Azithromycin for 4 more days.  AKI/?CKD: Serum creatinine 1.45 on admission. Unsure of his baseline. -Daily BMP -Hold home lisinopril  Left Leg pain/Hx of DVT's: he says he has DVT more than 2 times in his life. No calf tenderness on exam. CTA without  massive PE.  - BLE venous duplex in am - PT eval - Switch to Xarelto today  T2DM: CBG 122 this morning.  - SSI-moderate - CBG before meals and at bedtime - A1c  Asthma/COPD: Doubt if he had formal diagnosis for this.  Never smoker. -PRN dounebs  Hypertension: Normotensive  -Hold home lisinopril and amlodipine  OSA/?OHS -Nightly CPAP  Lung nodule: 7 mm solid-appearing nodule noted in the right lung on CTA. Follow up CT at 18-24 months recommended. Discussed this with the patient.  FEN/GI: heart healthy/diabetic diet  Prophylaxis: on full anticoagulation  Disposition: Continue inpatient management pending further workup and clinical improvement. CM consulted for PCP/hospital follow-up.   Subjective:  No acute events overnight. Shortness of breath improved. Continues to endorse leg pain.  Objective: Temp:  [95 F (35 C)-99 F (37.2 C)] 95 F (35 C) (07/21 0207) Pulse Rate:  [49-111] 64 (07/21 0207) Resp:  [14-29] 18 (07/21 0207) BP: (104-139)/(65-91) 104/65 (07/21 0120) SpO2:  [86 %-95 %] 95 % (07/21 0207) Weight:  [390 lb (176.9 kg)-392 lb 3.2 oz (177.9 kg)] 392 lb 3.2 oz (177.9 kg) (07/21 0120) Physical Exam: General: Appears well. Sitting on the edge of the bed eating breakfast. Cardiovascular: Normal rate. Irregularly irregular.  Respiratory: On 5 L by [place], no IWB, mild crackles over RLL posteriorly. Abdomen: Limited exam due to morbid obesity. No tenderness to palpation. Bowel sounds present. Extremities: Significant for venous stasis bilaterally. Lipidemia.   Laboratory:  Recent Labs Lab 04/13/17 1701 04/14/17 0715  WBC 5.7 5.4  HGB 14.6 13.5  HCT 47.7 43.9  PLT 193 191    Recent Labs Lab 04/13/17 1701 04/14/17 0715  NA 142 140  K 3.8 3.8  CL 103 99*  CO2 27 30  BUN 21* 20  CREATININE 1.45* 1.39*  CALCIUM 9.1 8.7*  PROT 7.2  --   BILITOT 1.2  --   ALKPHOS 75  --   ALT 15*  --   AST 23  --   GLUCOSE 141* 114*    Imaging/Diagnostic  Tests: CT negative for massive PE but with mild pneumonia and small lung node  Mercy Riding, MD 04/14/2017, 8:45 AM PGY-3, [place] Intern pager: [phone], text pages welcome

## 2017-04-14 NOTE — Progress Notes (Signed)
Patient states that he will place CPAP on when ready. RT will continue to monitor as needed.

## 2017-04-14 NOTE — Evaluation (Signed)
Physical Therapy Evaluation Patient Details Name: Roberto [name] MRN: [id number] DOB: 07/16/1961 Today's Date: 04/14/2017   History of Present Illness  Roberto [name] is a 56 y.o. male  admitted with dyspnea and left leg pain. Positive for DVT left LE confirmed this pm.  PMH is significant for asthma/copd, chf, copd, t2dm, osa, dvt in left leg, afib, htn  Clinical Impression  Pt admitted with above diagnosis. Pt currently with functional limitations due to the deficits listed below (see PT Problem List). Pt was able to ambulate in room but did not go out of room due to pt's doppler wasn't back and still work up for DVT as well as pt desat to 82% on RA with activity. Pt steady on feet overall.   Will follow acutely for increasing pt endurance.  Pt does state that he wants to join [employer] when he leaves hospital and this PT told himthat would be good just get restrictions from MD.  Pt agrees.   Pt will benefit from skilled PT to increase their independence and safety with mobility to allow discharge to the venue listed below.      Follow Up Recommendations No PT follow up;Supervision - Intermittent    Equipment Recommendations  None recommended by PT    Recommendations for Other Services       Precautions / Restrictions Precautions Precautions: Fall Restrictions Weight Bearing Restrictions: No      Mobility  Bed Mobility Overal bed mobility: Independent             General bed mobility comments: Pt was up in chair on arrival.   Transfers Overall transfer level: Independent                  Ambulation/Gait Ambulation/Gait assistance: Supervision Ambulation Distance (Feet): 65 Feet Assistive device: None Gait Pattern/deviations: Step-through pattern;Decreased stride length;Wide base of support   Gait velocity interpretation: Below normal speed for age/gender General Gait Details: No issues wtih gait stability.  endurance poor with desat to 82% on 5LO2 with  activity.  At rest on 5LO2 88-89%.  Stairs            Wheelchair Mobility    Modified Rankin (Stroke Patients Only)       Balance Overall balance assessment: Needs assistance Sitting-balance support: No upper extremity supported;Feet supported Sitting balance-Leahy Scale: Good     Standing balance support: No upper extremity supported;During functional activity Standing balance-Leahy Scale: Fair                               Pertinent Vitals/Pain Pain Assessment: 0-10 Pain Score: 4  Pain Location: left LE Pain Descriptors / Indicators: Aching;Grimacing Pain Intervention(s): Limited activity within patient's tolerance;Monitored during session;Repositioned    Home Living Family/patient expects to be discharged to:: Private residence Living Arrangements: Children (staying with daughter until he is better) Available Help at Discharge: Family;Available PRN/intermittently Type of Home: House Home Access: Stairs to enter Entrance Stairs-Rails: Left Entrance Stairs-Number of Steps: 7 Home Layout: One level Home Equipment: None      Prior Function Level of Independence: Independent               Hand Dominance        Extremity/Trunk Assessment   Upper Extremity Assessment Upper Extremity Assessment: Defer to OT evaluation    Lower Extremity Assessment Lower Extremity Assessment: Generalized weakness    Cervical / Trunk Assessment Cervical / Trunk Assessment: Normal  Communication   Communication: No difficulties  Cognition Arousal/Alertness: Awake/alert Behavior During Therapy: WFL for tasks assessed/performed Overall Cognitive Status: Within Functional Limits for tasks assessed                                        General Comments      Exercises     Assessment/Plan    PT Assessment Patient needs continued PT services  PT Problem List Decreased activity tolerance;Decreased balance;Decreased mobility;Decreased  knowledge of use of DME;Decreased safety awareness;Decreased knowledge of precautions;Cardiopulmonary status limiting activity;Pain       PT Treatment Interventions DME instruction;Gait training;Functional mobility training;Therapeutic activities;Therapeutic exercise;Balance training;Patient/family education;Stair training    PT Goals (Current goals can be found in the Care Plan section)  Acute Rehab PT Goals Patient Stated Goal: to go home PT Goal Formulation: With patient Time For Goal Achievement: 04/28/17 Potential to Achieve Goals: Good Additional Goals Additional Goal #1: Pt to maintain sats>90% on RA with activity.     Frequency Min 3X/week   Barriers to discharge        Co-evaluation               AM-PAC PT "6 Clicks" Daily Activity  Outcome Measure Difficulty turning over in bed (including adjusting bedclothes, sheets and blankets)?: None Difficulty moving from lying on back to sitting on the side of the bed? : None Difficulty sitting down on and standing up from a chair with arms (e.g., wheelchair, bedside commode, etc,.)?: None Help needed moving to and from a bed to chair (including a wheelchair)?: None Help needed walking in hospital room?: A Little Help needed climbing 3-5 steps with a railing? : A Little 6 Click Score: 22    End of Session Equipment Utilized During Treatment: Gait belt;Oxygen Activity Tolerance: Patient limited by fatigue Patient left: in chair;with call bell/phone within reach Nurse Communication: Mobility status PT Visit Diagnosis: Muscle weakness (generalized) (M62.81);Pain Pain - Right/Left: Left Pain - part of body: Leg    Time: 8719-9412 PT Time Calculation (min) (ACUTE ONLY): 16 min   Charges:   PT Evaluation $PT Eval Low Complexity: 1 Procedure     PT G Codes:   PT G-Codes **NOT FOR INPATIENT CLASS** Functional Assessment Tool Used: AM-PAC 6 Clicks Basic Mobility Functional Limitation: Mobility: Walking and moving  around Mobility: Walking and Moving Around Current Status (R0475): At least 1 percent but less than 20 percent impaired, limited or restricted Mobility: Walking and Moving Around Goal Status [id number]): At least 1 percent but less than 20 percent impaired, limited or restricted    [place] [phone] (pager)   Denice Paradise 04/14/2017, 4:43 PM

## 2017-04-14 NOTE — Progress Notes (Signed)
ANTICOAGULATION CONSULT NOTE - Initial Consult  Pharmacy Consult for Heparin  Indication: atrial fibrillation and history of DVT  No Known Allergies  Patient Measurements: Height: _0  (175.3 cm) Weight: (!) 392 lb 3.2 oz (177.9 kg) IBW/kg (Calculated) : 70.7 Vital Signs: Temp: 99 F (37.2 C) (07/21 0120) Temp Source: Oral (07/21 0120) BP: 104/65 (07/21 0120) Pulse Rate: 69 (07/21 0120)  Labs:  Recent Labs  04/13/17 1701 04/13/17 1941  HGB 14.6  --   HCT 47.7  --   PLT 193  --   CREATININE 1.45*  --   TROPONINI  --  <0.03    Estimated Creatinine Clearance: 91.4 mL/min (A) (by C-G formula based on SCr of 1.45 mg/dL (H)).   Medical History: Past Medical History:  Diagnosis Date  . Asthma   . CHF (congestive heart failure) ([place])   . COPD (chronic obstructive pulmonary disease) ([place])   . Diabetes mellitus without complication ([place])   . Hypertension     Assessment: 56 y/o M here with shortness of breath and leg pain. Pt also noted to be in afib. Pt also has hx DVT but stopped taking Xarelto several months ago due to insurance reasons. CBC good, noted bump in Scr.   Goal of Therapy:  Heparin level 0.3-0.7 units/ml Monitor platelets by anticoagulation protocol: Yes   Plan:  Heparin 6000 units BOLUS Start heparin drip at 1600 units/hr 1000 HL Daily CBC/HL Monitor for bleeding   Narda Bonds 04/14/2017,1:31 AM

## 2017-04-14 NOTE — Progress Notes (Signed)
Pt. Arrived to the unit in alert and stable condition via stretcher. Pt. Able to ambulate from stretcher to bed. CCMD notified of pts. Arrival to the unit. Pt. Placed on telemetry and oriented to the room. Call light placed within reach. RN will continue to monitor pt. For changes in condition. Roberto [name], Roberto [name]

## 2017-04-14 NOTE — Progress Notes (Addendum)
ANTICOAGULATION CONSULT NOTE  Pharmacy Consult for Heparin >>xarelto Indication: atrial fibrillation and history of DVT  No Known Allergies  Patient Measurements: Height: 5' 9" (175.3 cm) Weight: (!) 392 lb 3.2 oz (177.9 kg) IBW/kg (Calculated) : 70.7 Vital Signs: Temp: 95 F (35 C) (07/21 0207) Temp Source: Oral (07/21 0120) BP: 104/65 (07/21 0120) Pulse Rate: 64 (07/21 0207)  Labs:  Recent Labs  04/13/17 1701 04/13/17 1941 04/14/17 0315 04/14/17 0715  HGB 14.6  --   --  13.5  HCT 47.7  --   --  43.9  PLT 193  --   --  191  HEPARINUNFRC  --   --   --  0.25*  CREATININE 1.45*  --   --  1.39*  TROPONINI  --  <0.03 <0.03 <0.03    Estimated Creatinine Clearance: 95.3 mL/min (A) (by C-G formula based on SCr of 1.39 mg/dL (H)).   Medical History: Past Medical History:  Diagnosis Date  . Asthma   . CHF (congestive heart failure) ([place])   . COPD (chronic obstructive pulmonary disease) ([place])   . Diabetes mellitus without complication ([place])   . Hypertension     Assessment: 56 y/o M here with shortness of breath and leg pain. Pt also noted to be in afib. Pt also has hx DVT but stopped taking Xarelto several months ago due to insurance reasons. CBC good, noted bump in Scr.   New orders this morning to transition patient back to xarelto. He is above recommended weight at 177kg, renal function is ok for full dose xarelto. Will consult case management to investigate whether he can afford xarelto now given history of noncompliance.  Goal of Therapy:  Heparin level 0.3-0.7 units/ml Monitor platelets by anticoagulation protocol: Yes   Plan:  Stop heparin and start xarelto 65m this morning, futher doses with dinner  FErin HearingPharmD., BCPS Clinical Pharmacist Pager [phone]/21/2018 8:55 AM

## 2017-04-14 NOTE — Progress Notes (Signed)
Patient stable during 7 a to 7 pshift, sat up in chair for much of shift.  Patient having pain in left leg off and on, Tylenol administered first then due to continued pain notified MD who ordered Tramadol, patient expressed more relief with Tramadol.  Son and grandchild at bedside for part of shift.

## 2017-04-14 NOTE — Progress Notes (Signed)
*  PRELIMINARY RESULTS* Vascular Ultrasound Lower extremity venous duplex has been completed.  Preliminary findings: technically limited due to body habitus. Appears to be DVT in the left mid-distal femoral vein and popliteal vein. No obvious DVT RLE. Right baker's cyst noted.   Called results to [facility], [place], RDMS, RVT  04/14/2017, 11:25 AM

## 2017-04-14 NOTE — Progress Notes (Signed)
Notified by vascular that patient is positive for DVT on the left, already on Xarelto.  Notified family medicine provider via text page.

## 2017-04-15 ENCOUNTER — Inpatient Hospital Stay (HOSPITAL_COMMUNITY): Payer: Medicare Other

## 2017-04-15 DIAGNOSIS — I361 Nonrheumatic tricuspid (valve) insufficiency: Secondary | ICD-10-CM

## 2017-04-15 DIAGNOSIS — I824Y2 Acute embolism and thrombosis of unspecified deep veins of left proximal lower extremity: Secondary | ICD-10-CM

## 2017-04-15 LAB — GLUCOSE, CAPILLARY
GLUCOSE-CAPILLARY: 144 mg/dL — AB (ref 65–99)
GLUCOSE-CAPILLARY: 123 mg/dL — AB (ref 65–99)
Glucose-Capillary: 113 mg/dL — ABNORMAL HIGH (ref 65–99)
Glucose-Capillary: 110 mg/dL — ABNORMAL HIGH (ref 65–99)

## 2017-04-15 LAB — BASIC METABOLIC PANEL
Anion gap: 8 (ref 5–15)
BUN: 25 mg/dL — AB (ref 6–20)
CHLORIDE: 103 mmol/L (ref 101–111)
CO2: 30 mmol/L (ref 22–32)
Calcium: 8.6 mg/dL — ABNORMAL LOW (ref 8.9–10.3)
Creatinine, Ser: 1.37 mg/dL — ABNORMAL HIGH (ref 0.61–1.24)
GFR calc Af Amer: >60 mL/min (ref >60)
GFR, EST NON AFRICAN AMERICAN: 56 mL/min — AB (ref >60)
GLUCOSE: 130 mg/dL — AB (ref 65–99)
POTASSIUM: 4.4 mmol/L (ref 3.5–5.1)
Sodium: 141 mmol/L (ref 135–145)

## 2017-04-15 LAB — ECHOCARDIOGRAM COMPLETE
HEIGHTINCHES: 69 in
WEIGHTICAEL: 6259.2 [oz_av]

## 2017-04-15 MED ORDER — RIVAROXABAN 15 MG PO TABS
15.0000 mg | ORAL_TABLET | Freq: Two times a day (BID) | ORAL | Status: DC
  Administered 2017-04-15 – 2017-04-18 (×7): 15 mg via ORAL
  Filled 2017-04-15 (×7): qty 1

## 2017-04-15 MED ORDER — RIVAROXABAN 20 MG PO TABS
20.0000 mg | ORAL_TABLET | Freq: Every day | ORAL | Status: DC
  Administered 2017-04-18: 20 mg via ORAL

## 2017-04-15 NOTE — Progress Notes (Signed)
ANTICOAGULATION CONSULT NOTE  Pharmacy Consult for Heparin >>xarelto Indication: DVT  No Known Allergies  Patient Measurements: Height: _0  (175.3 cm) Weight: (!) 391 lb 3.2 oz (177.4 kg) IBW/kg (Calculated) : 70.7 Vital Signs: Temp: 97.8 F (36.6 C) (07/22 0622) Temp Source: Oral (07/22 0622) BP: 96/55 (07/22 0848) Pulse Rate: 60 (07/22 0848)  Labs:  Recent Labs  04/13/17 1701  04/14/17 0315 04/14/17 0715 04/14/17 1313 04/15/17 0209  HGB 14.6  --   --  13.5  --   --   HCT 47.7  --   --  43.9  --   --   PLT 193  --   --  191  --   --   HEPARINUNFRC  --   --   --  0.25*  --   --   CREATININE 1.45*  --   --  1.39*  --  1.37*  TROPONINI  --   < > <0.03 <0.03 <0.03  --   < > = values in this interval not displayed.  Estimated Creatinine Clearance: 96.6 mL/min (A) (by C-G formula based on SCr of 1.37 mg/dL (H)).   Medical History: Past Medical History:  Diagnosis Date  . Asthma   . CHF (congestive heart failure) ([place])   . COPD (chronic obstructive pulmonary disease) ([place])   . Diabetes mellitus without complication ([place])   . Hypertension     Assessment: 56 y/o M here with SOB and leg pain. Pt also noted to be in afib. Pt also has hx DVT but stopped taking Xarelto several months ago due to insurance reasons. CBC good, noted bump in Scr.   New orders this morning to transition patient back to xarelto. He is above recommended weight at 177kg, renal function is ok for full dose xarelto. Will consult case management to investigate whether he can afford xarelto now given history of noncompliance.  DVT found on LE doppler.  Goal of Therapy:  Monitor platelets by anticoagulation protocol: Yes   Plan:  DVT tx - Xarelto 15 mg BID with meals x 21 days, then Xarelto 20 mg daily with dinner  Angus Seller, PharmD Pharmacy Resident Pager: [phone] 04/15/2017 11:46 AM

## 2017-04-15 NOTE — Progress Notes (Signed)
CCMD called stating patient was bradycardiac in the 20's and 30's not sustained.  RN notified family medicine.  Patient is asymptomatic, says he has had this happen before on prior admissions.

## 2017-04-15 NOTE — Discharge Summary (Signed)
[facility] Discharge Summary  Patient name: Roberto [name] Medical record number: [id number] Date of birth: 1961-07-30 Age: 56 y.o. Gender: male Date of Admission: 04/13/2017  Date of Discharge: 04/18/2017 Admitting Physician: Kinnie Feil, MD  Primary Care Provider: Mikey Bussing Candida Peeling, MD Consultants: None  Indication for Hospitalization: SOB, LLE Pain  Discharge Diagnoses/Problem List:  Patient Active Problem List   Diagnosis Date Noted  . Chronic respiratory failure with hypoxia ([place])   . Acute deep vein thrombosis (DVT) of proximal vein of left lower extremity ([place])   . SOB (shortness of breath) 04/14/2017  . Acute on chronic congestive heart failure ([place])   . Left leg pain   . Shortness of breath   . Atrial fibrillation ([place])   . Community acquired pneumonia     Disposition: Home  Discharge Condition: Improved  Discharge Exam: General: Resting comfortably in bed, tolerated taking cpap off. no acute distress, aox3 Cardiovascular: normal rate, regular rate and rhythm, no murmurs, rubs, gallops appreciated Respiratory: CTAB though limited due to patient's body habitus. Normal WOB on 5L [place], able to speak in full sentences Abdomen: Obese, non-tender, +BS Extremities: Chronic venous stasis changes to LE bilaterally,decreased pitting edema BLE, TTP of LLE diffusely, no palpable cords Neuro: A&Ox3, no gross deficits Psych: appropriate mood and affect  Brief Hospital Course:  Patient presented with SOB and LLE pain. He reported progressively worsening SOB throughout day of ED presentation unresponsive to his home 5L supplemental O2. Also reported worsening LLE pain for 3-4 days prior to presentation. Finally, patient endorsed night sweats for 3-4 nights with associated chills. In ED, EKG showed a.fib, CT scan revealed no obvious PE but possible infiltrate, CXR was consistent with fluid overload, and BNP was elevated to 274. Also noted to have AKI  with Cr 1.45 (baseline unknown). He was started on CTX and azithro in the ED and given IV Lasix. He was subsequently admitted for further work-up.  Upon further work-up, patient's dyspnea was thought to be secondary to CHF exacerbation. He had good urine output with IV Lasix, and was quickly transitioned to PO torsemide. PNA noted on imaging also possibly contributing to symptoms, so he was continued on azithromycin. For a.fib on EKG, he was started on heparin drip. After first night of admission, he was taken off heparin drip and resumed home Xarelto.  Patient continued to report leg pain, so LE Dopplers were performed, which showed L mid-distal femoral vein and popliteal vein DVT. As patient was already on Xarelto, no further treatment was initiated.    Issues for Follow Up:  1. Follow up with cardiology for xarelto/ac management 2. Follow up with pulmonary for probably sleep study 3. Follow up with pcp in 1-2 weeks for hospital follow up, fluid status  Significant Procedures: echo  Significant Labs and Imaging:   Recent Labs Lab 04/13/17 1701 04/14/17 0715 04/16/17 0409  WBC 5.7 5.4 6.0  HGB 14.6 13.5 13.4  HCT 47.7 43.9 44.4  PLT 193 191 192    Recent Labs Lab 04/13/17 1701 04/14/17 0715 04/15/17 0209 04/16/17 0409 04/17/17 0338 04/18/17 0533  NA 142 140 141 138 142 139  K 3.8 3.8 4.4 4.3 3.7 4.4  CL 103 99* 103 103 103 96*  CO2 _0 33* 34*  GLUCOSE 141* 114* 130* 102* 102* 102*  BUN 21* 20 25* 23* 21* 23*  CREATININE 1.45* 1.39* 1.37* 1.22 1.19 1.32*  CALCIUM 9.1 8.7* 8.6* 8.6* 8.8* 9.0  ALKPHOS  75  --   --   --   --   --   AST 23  --   --   --   --   --   ALT 15*  --   --   --   --   --   ALBUMIN 4.0  --   --   --   --   --    LE Doppler - L mid-distal femoral vein and popliteal vein  No results found. Results/Tests Pending at Time of Discharge: none  Discharge Medications:  Allergies as of 04/18/2017   No Known Allergies     Medication List     TAKE these medications   ALEVE 220 MG tablet Generic drug:  naproxen sodium Take 220 mg by mouth every 12 (twelve) hours as needed (pain).   amLODipine 5 MG tablet Commonly known as:  NORVASC Take 5 mg by mouth daily.   cholecalciferol 1000 units tablet Commonly known as:  VITAMIN D Take 1,000 Units by mouth daily.   ferrous sulfate 325 (65 FE) MG tablet Take 325 mg by mouth daily.   glyBURIDE 5 MG tablet Commonly known as:  DIABETA Take 5 mg by mouth at bedtime.   lisinopril 10 MG tablet Commonly known as:  PRINIVIL,ZESTRIL Take 1 tablet (10 mg total) by mouth daily. What changed:  medication strength  how much to take  when to take this   metFORMIN 1000 MG tablet Commonly known as:  GLUCOPHAGE Take 1,000 mg by mouth 2 (two) times daily.   multivitamin with minerals Tabs tablet Take 1 tablet by mouth daily.   OXYGEN Inhale 5 L into the lungs continuous.   PRESCRIPTION MEDICATION Inhale into the lungs as needed (whenever sleeping - naps or at night). CPAP   Rivaroxaban 15 MG Tabs tablet Commonly known as:  XARELTO Take 1 tablet (15 mg total) by mouth 2 (two) times daily with a meal.   torsemide 20 MG tablet Commonly known as:  DEMADEX Take 40 mg by mouth at bedtime.       Discharge Instructions: Please refer to Patient Instructions section of EMR for full details.  Patient was counseled important signs and symptoms that should prompt return to medical care, changes in medications, dietary instructions, activity restrictions, and follow up appointments.   Follow-Up Appointments: F/U with pcp in 1-2 weeks F/U with cardiologist at appt on 04/20/2017 F/U with pulmonologist when able in 2-3 weeks   Guadalupe Dawn 04/18/2017 PGY-1, [place]

## 2017-04-15 NOTE — Progress Notes (Signed)
Echocardiogram complete

## 2017-04-15 NOTE — Progress Notes (Signed)
VSS during 7 a to  p shift other than bradycardia at times as mentioned in previous note.  Patient states his leg is feeling better, continues on Tramadol as needed for pain.

## 2017-04-15 NOTE — Progress Notes (Signed)
Family Medicine Teaching Service Daily Progress Note Intern Pager: [phone]  Patient name: Roberto [name] Medical record number: [id number] Date of birth: 1961/06/03 Age: 56 y.o. Gender: male  Primary Care Provider: Dhivianathan, Candida Peeling, MD Consultants: None Code Status: FULL  Pt Overview and Major Events to Date:  07/20 admit to FPTS; CTX x1, started azithro  07/21 DVT noted on LE Doppler  Assessment and Plan: Roberto [name] a 56 y.o.male admitted with dyspnea and left leg pain. PMH is significant for asthma/COPD, CHF, Type II DM, OSA,  LLE DVT, a.fib, HTN.  Dyspnea: Improved. Likely due to CHF exacerbation given CXR, elevated BNP (even with obesity) and improvement of symptoms with IV Lasix. PNA noted on CXR and CTA also possibly contributing. Admits dietary indiscretion. -3.125L urinary output since admission after receiving IV Lasix and home torsemide. On home 5L and maintaining appropriate O2 sats.  - Continue home torsemide - Strict I/O, monitor daily weights - Echo pending - Hold home lisinopril - Continue cardiac monitor - Continue home oxygen (5L) - Continue azithro for PNA treatment  Pneumonia: Exam with some crackles over RLL posteriorly. CTA with mild pneumonia in RLL. Received CTX and azithromycin ED. Remains afebrile with O2 sat in 90s on home 5L.  - On azithro day 3/5  DVT: Apparent DVT on LE Doppler in L mid-distal femoral vein and popliteal vein. Likely cause of patient's reported leg pain. CTA without PE.  - Continue home Xarelto  CHF: As above  Abnormal EKG: Afib, RAD, TWI in precordial and inferior leads. No prior EKG to compare to. Patient without chest pain but dyspnea. Troponin negative x3. Repeat EKG also with a.fib, R axis deviation, and ST wave abnormalities.  -A. fib management as below  Atrial fibrillation w/o RVR: chad-vasc score 6. Not on rate or rhythm control. Previously on Xarelto.  Denies history of GI bleed. HR 48-70 overnight.  -  Continue home Xarelto - Cardiac monitor - Monitor vitals  AKI/?CKD: Serum creatinine 1.45 on admission. Unsure of his baseline. Cr improved to 1.37 this AM.  -Daily BMP -Hold home lisinopril  T2DM: CBG 113 this AM.  - Mod SSI - CBG qACHS - A1c pending  Asthma/COPD: No obvious prior formal diagnosis. Never smoker. On home 5L [place].  -PRN dounebs  Hypertension: Normotensive to hypotensive overnight (BP 104-118/58-61).  -Hold home lisinopril and amlodipine  OSA/?OHS -Nightly CPAP  Lung nodule: 7 mm solid-appearing nodule noted in the right lung on CTA. Follow up CT at 18-24 months recommended. Discussed this with the patient.  FEN/GI: heart healthy/diabetic diet  Prophylaxis: on full anticoagulation  Disposition: home pending medical improvement  Subjective:  Patient reporting leg pain and dyspnea with exertion today. Is on home 5L and denies difficulty breathing at rest.   Objective: Temp:  [97.8 F (36.6 C)-98.8 F (37.1 C)] 97.8 F (36.6 C) (07/22 0622) Pulse Rate:  [53-65] 60 (07/22 0848) Resp:  [20] 20 (07/22 0622) BP: (96-118)/(55-73) 96/55 (07/22 0848) SpO2:  [92 %-94 %] 93 % (07/22 0848) Weight:  [391 lb 3.2 oz (177.4 kg)] 391 lb 3.2 oz (177.4 kg) (07/22 5885) Physical Exam: General: Lying in bed in NAD Cardiovascular: Irregularly irregular rhythm, normal rate Respiratory: CTAB though limited due to patient's body habitus. Normal WOB on 5L [place], able to speak in full sentences Abdomen: Obese, non-tender, +BS Extremities: Chronic venous stasis changes to LE bilaterally, no pitting edema, TTP of LLE diffusely, no palpable cords Neuro: A&Ox3, no gross deficits Psych: appropriate mood and affect  Laboratory:  Recent Labs Lab 04/13/17 1701 04/14/17 0715  WBC 5.7 5.4  HGB 14.6 13.5  HCT 47.7 43.9  PLT 193 191    Recent Labs Lab 04/13/17 1701 04/14/17 0715 04/15/17 0209  NA 142 140 141  K 3.8 3.8 4.4  CL 103 99* 103  CO2 _0 BUN 21* 20  25*  CREATININE 1.45* 1.39* 1.37*  CALCIUM 9.1 8.7* 8.6*  PROT 7.2  --   --   BILITOT 1.2  --   --   ALKPHOS 75  --   --   ALT 15*  --   --   AST 23  --   --   GLUCOSE 141* 114* 130*    Imaging/Diagnostic Tests: Dg Chest 2 View  Result Date: 04/13/2017 CLINICAL DATA:  Shortness of breath. EXAM: CHEST  2 VIEW COMPARISON:  None. FINDINGS: The cardio pericardial silhouette is enlarged. There is pulmonary vascular congestion without overt pulmonary edema although a component of dependent interstitial edema towards the bases is suspected. Lateral film limited by underpenetration. No pleural effusion. The visualized bony structures of the thorax are intact. IMPRESSION: Enlargement of the cardiopericardial silhouette with vascular congestion and potential interstitial pulmonary edema. Electronically Signed   By: Misty Stanley M.D.   On: 04/13/2017 18:49   Ct Angio Chest Pe W And/or Wo Contrast  Result Date: 04/13/2017 CLINICAL DATA:  Shortness of breath x2 days, history of DVT, off Xarelto EXAM: CT ANGIOGRAPHY CHEST WITH CONTRAST TECHNIQUE: Multidetector CT imaging of the chest was performed using the standard protocol during bolus administration of intravenous contrast. Multiplanar CT image reconstructions and MIPs were obtained to evaluate the vascular anatomy. CONTRAST:  120 mL Isovue 370 IV COMPARISON:  Chest radiographs dated 04/13/2017 FINDINGS: Cardiovascular: Suboptimal contrast opacification of the bilateral pulmonary arteries. Initial scan is not diagnostic. Repeat evaluation is notable for adequate contrast opacification to the level of the lobar pulmonary arteries. No evidence of central or lobar pulmonary embolism. Segmental or subsegmental pulmonary embolism cannot be excluded. Cardiomegaly.  Small pericardial effusion. Mild enlargement of the main pulmonary artery, suggesting pulmonary arterial hypertension. No evidence of thoracic aortic aneurysm or dissection. Mediastinum/Nodes: Small  mediastinal lymph nodes measuring up to 18 mm short axis (series 4/ image 20), likely reactive. Visualized thyroid is unremarkable. Lungs/Pleura: Patchy opacity in the posterior right upper lobe (series 16/ image 41), possibly reflecting mild pneumonia, less likely mosaic attenuation. Mild dependent atelectasis in the posterior left lower lobe. 7 mm right lower lobe pulmonary nodule (series 16/ image 67). No pleural effusion or pneumothorax. Upper Abdomen: Visualized upper abdomen is notable for mild hepatic steatosis. Musculoskeletal: Degenerative changes of the lower thoracic spine. Review of the MIP images confirms the above findings. IMPRESSION: Limited evaluation with suboptimal contrast opacification of the bilateral pulmonary arteries. No evidence of central or lobar pulmonary embolism. Segmental or subsegmental pulmonary embolism cannot be excluded. Mild patchy opacity in the posterior right upper lobe, possibly reflecting mild pneumonia. 7 mm right lower lobe pulmonary nodule. Non-contrast chest CT at 6-12 months is recommended. If the nodule is stable at time of repeat CT, then future CT at 18-24 months (from today's scan) is considered optional for low-risk patients, but is recommended for high-risk patients. This recommendation follows the consensus statement: Guidelines for Management of Incidental Pulmonary Nodules Detected on CT Images: From the Fleischner Society 2017; Radiology 2017; 284:228-243. Cardiomegaly. Small pericardial effusion. Suspected pulmonary arterial hypertension. Electronically Signed   By: Henderson Newcomer.D.  On: 04/13/2017 21:38     Verner Mould, MD 04/15/2017, 9:36 AM PGY-3, [place] Intern pager: [phone], text pages welcome

## 2017-04-15 NOTE — Plan of Care (Signed)
Problem: Pain Managment: Goal: General experience of comfort will improve Outcome: Progressing Leg pain controlled with Tramadol   Problem: Tissue Perfusion: Goal: Risk factors for ineffective tissue perfusion will decrease Outcome: Progressing Patient on oxygen at 5 liters at baseline   Problem: Fluid Volume: Goal: Ability to maintain a balanced intake and output will improve Outcome: Progressing Continues to diurese with po Torsemide

## 2017-04-15 NOTE — Progress Notes (Signed)
PT Cancellation Note  Patient Details Name: Roberto [name] MRN: [id number] DOB: 1961/05/08   Cancelled Treatment:     Therapy attempted to see the patient in the morning. Patient declined stating he was too tired. Therapy attempted to try again in the afternoon but time did not permit. PT will follow up on 7/23.    Carney Living 04/15/2017, 3:22 PM

## 2017-04-16 DIAGNOSIS — J9611 Chronic respiratory failure with hypoxia: Secondary | ICD-10-CM

## 2017-04-16 LAB — CBC
HCT: 44.4 % (ref 39.0–52.0)
Hemoglobin: 13.4 g/dL (ref 13.0–17.0)
MCH: 27.5 pg (ref 26.0–34.0)
MCHC: 30.2 g/dL (ref 30.0–36.0)
MCV: 91.2 fL (ref 78.0–100.0)
PLATELETS: 192 10*3/uL (ref 150–400)
RBC: 4.87 MIL/uL (ref 4.22–5.81)
RDW: 15.9 % — AB (ref 11.5–15.5)
WBC: 6 10*3/uL (ref 4.0–10.5)

## 2017-04-16 LAB — GLUCOSE, CAPILLARY
GLUCOSE-CAPILLARY: 110 mg/dL — AB (ref 65–99)
Glucose-Capillary: 102 mg/dL — ABNORMAL HIGH (ref 65–99)
Glucose-Capillary: 121 mg/dL — ABNORMAL HIGH (ref 65–99)
Glucose-Capillary: 114 mg/dL — ABNORMAL HIGH (ref 65–99)

## 2017-04-16 LAB — BASIC METABOLIC PANEL
Anion gap: 10 (ref 5–15)
BUN: 23 mg/dL — AB (ref 6–20)
CALCIUM: 8.6 mg/dL — AB (ref 8.9–10.3)
CO2: 25 mmol/L (ref 22–32)
Chloride: 103 mmol/L (ref 101–111)
Creatinine, Ser: 1.22 mg/dL (ref 0.61–1.24)
GFR calc Af Amer: >60 mL/min (ref >60)
GLUCOSE: 102 mg/dL — AB (ref 65–99)
POTASSIUM: 4.3 mmol/L (ref 3.5–5.1)
Sodium: 138 mmol/L (ref 135–145)

## 2017-04-16 MED ORDER — FUROSEMIDE 10 MG/ML IJ SOLN
80.0000 mg | Freq: Two times a day (BID) | INTRAMUSCULAR | Status: AC
  Administered 2017-04-16 – 2017-04-17 (×2): 80 mg via INTRAVENOUS
  Filled 2017-04-16 (×2): qty 8

## 2017-04-16 NOTE — Progress Notes (Addendum)
Family Medicine Teaching Service Daily Progress Note Intern Pager: [phone]  Patient name: Roberto [name] Medical record number: [id number] Date of birth: 1961/04/30 Age: 56 y.o. Gender: male  Primary Care Provider: Dhivianathan, Candida Peeling, MD Consultants: None Code Status: FULL  Pt Overview and Major Events to Date:  07/20 admit to FPTS; CTX x1, started azithro  07/21 DVT noted on LE Doppler  Assessment and Plan: Roberto [name] a 56 y.o.male admitted with dyspnea and left leg pain. PMH is significant for asthma/COPD, CHF, Type II DM, OSA,  LLE DVT, a.fib, HTN.  Dyspnea: Improved. Likely due to CHF exacerbation given CXR, elevated BNP (even with obesity) and improvement of symptoms with IV Lasix. PNA noted on CXR and CTA also possibly contributing. Admits dietary indiscretion. ~-4L net output output since admission after receiving IV Lasix and home torsemide. On home 5L and maintaining appropriate O2 sats. Echo performed 7/22 which revealed 60-65% EF with pulm hypertension. - Continue home torsemide - Strict I/O, monitor daily weights - Hold home lisinopril - Continue cardiac monitor - Continue home oxygen (5L) - Continue azithro for PNA treatment  Pneumonia: Exam with some crackles over RLL posteriorly. CTA with mild pneumonia in RLL. Received CTX and azithromycin ED. Remains afebrile with O2 sat in 90s on home 5L.  - On azithro day 4/5  DVT: Apparent DVT on LE Doppler in L mid-distal femoral vein and popliteal vein. Likely cause of patient's reported leg pain. CTA without PE.  - Continue home Xarelto  CHF: Likely due to CHF exacerbation given CXR, elevated BNP (even with obesity) and improvement of symptoms with IV Lasix. PNA noted on CXR and CTA also possibly contributing. Admits dietary indiscretion. ~-4L net output output since admission after receiving IV Lasix and home torsemide. On home 5L and maintaining appropriate O2 sats. Echo performed 7/22 which revealed 60-65% EF  with pulm hypertension. - Continue home torsemide - Strict I/O, monitor daily weights - Hold home lisinopril - Continue cardiac monitor - Continue home oxygen (5L) - Continue azithro for PNA treatment  Abnormal EKG: Afib, RAD, TWI in precordial and inferior leads. No prior EKG to compare to. Patient without chest pain but dyspnea. Troponin negative x3. Repeat EKG also with a.fib, R axis deviation, and ST wave abnormalities.  -A. fib management as below  Atrial fibrillation w/o RVR: chad-vasc score 6. Not on rate or rhythm control. Previously on Xarelto.  Denies history of GI bleed. HR 48-70 overnight.  - Continue home Xarelto - Cardiac monitor - Monitor vitals  AKI/?CKD: Serum creatinine 1.45 on admission. Unsure of his baseline. Cr improved to 1.22 this AM.  -Daily BMP -Hold home lisinopril  T2DM: CBG 102 this AM.  - Mod SSI - CBG qACHS - A1c pending  Asthma/COPD: No obvious prior formal diagnosis. Never smoker. On home 5L [place].  -PRN dounebs  Hypertension: Normotensive to hypotensive overnight (BP 104-118/58-61).  -Hold home lisinopril and amlodipine  OSA/?OHS -Nightly CPAP  Lung nodule: 7 mm solid-appearing nodule noted in the right lung on CTA. Follow up CT at 18-24 months recommended. Discussed this with the patient.  FEN/GI: heart healthy/diabetic diet  Prophylaxis: on full anticoagulation  Disposition: possibly home today  Subjective:  Patient reporting some leg pain and still endorsing some dyspnea on exertion although he feels like this is improving. Was able to tolerate breakfast with no problems. Having BM, urinating appropriately.  Objective: Temp:  [98.4 F (36.9 C)-98.7 F (37.1 C)] 98.4 F (36.9 C) (07/22 2105) Pulse Rate:  [57-67]  67 (07/23 0881) Resp:  [20] 20 (07/23 0648) BP: (96-115)/(55-71) 115/71 (07/23 0648) SpO2:  [93 %-94 %] 94 % (07/23 0648) Weight:  [391 lb 11.2 oz (177.7 kg)] 391 lb 11.2 oz (177.7 kg) (07/23 0321) Physical  Exam: General: Sitting at edge of bed Cardiovascular: normal rate, regular rate and rhythm, no murmurs, rubs, gallops appreciated Respiratory: CTAB though limited due to patient's body habitus. Normal WOB on 5L [place], able to speak in full sentences Abdomen: Obese, non-tender, +BS Extremities: Chronic venous stasis changes to LE bilaterally, no pitting edema, TTP of LLE diffusely, no palpable cords Neuro: A&Ox3, no gross deficits Psych: appropriate mood and affect  Laboratory:  Recent Labs Lab 04/13/17 1701 04/14/17 0715 04/16/17 0409  WBC 5.7 5.4 6.0  HGB 14.6 13.5 13.4  HCT 47.7 43.9 44.4  PLT 193 191 192    Recent Labs Lab 04/13/17 1701 04/14/17 0715 04/15/17 0209 04/16/17 0409  NA 142 140 141 138  K 3.8 3.8 4.4 4.3  CL 103 99* 103 103  CO2 _0 BUN 21* 20 25* 23*  CREATININE 1.45* 1.39* 1.37* 1.22  CALCIUM 9.1 8.7* 8.6* 8.6*  PROT 7.2  --   --   --   BILITOT 1.2  --   --   --   ALKPHOS 75  --   --   --   ALT 15*  --   --   --   AST 23  --   --   --   GLUCOSE 141* 114* 130* 102*    Imaging/Diagnostic Tests: No results found.   Guadalupe Dawn, MD 04/16/2017, 7:23 AM  PGY-1, [place] Intern pager: [phone], text pages welcome

## 2017-04-16 NOTE — Progress Notes (Addendum)
Physical Therapy Treatment Patient Details Name: Roberto [name] MRN: [id number] DOB: [birth year] Today's Date: 04/16/2017    History of Present Illness Roberto [name] is a 56 y.o. male  admitted with dyspnea and left leg pain. Positive for DVT left LE confirmed this pm.  PMH is significant for asthma/copd, chf, copd, t2dm, osa, dvt in left leg, afib, htn    PT Comments    Patient is making progress toward mobility goals however does desat with mobility on 6L O2 via [place] and with 2/4 DOE. Pt tolerated stair negotiation and increased gait distance with 2 seated rest breaks and 2 standing rest breaks. Pt will continue to benefit from skilled PT in acute setting prior to d/c.   Follow Up Recommendations  No PT follow up;Supervision - Intermittent     Equipment Recommendations  None recommended by PT    Recommendations for Other Services       Precautions / Restrictions Precautions Precautions: Fall Precaution Comments: check O2 Restrictions Weight Bearing Restrictions: No    Mobility  Bed Mobility               General bed mobility comments: OOB in chair upon arrival  Transfers Overall transfer level: Independent                  Ambulation/Gait Ambulation/Gait assistance: Supervision Ambulation Distance (Feet): 140 Feet Assistive device: None (pushed O2 tank) Gait Pattern/deviations: Step-through pattern;Decreased stride length;Wide base of support     General Gait Details: 2 seated rest breaks required; pt on 6L O2 via [place] with SpO2 desat into upper 70s with ambulation and up to 91% with seated rest break; cues for pursed lip breathing technique    Stairs Stairs: Yes   Stair Management: One rail Left;Two rails;Step to pattern;Forwards Number of Stairs:  (flight) General stair comments: cues for sequencing, safety, and breathing technique; standing rest break on landing of stairs   Wheelchair Mobility    Modified Rankin (Stroke Patients Only)        Balance Overall balance assessment: Needs assistance Sitting-balance support: No upper extremity supported;Feet supported Sitting balance-Leahy Scale: Good     Standing balance support: No upper extremity supported;During functional activity Standing balance-Leahy Scale: Fair                              Cognition Arousal/Alertness: Awake/alert Behavior During Therapy: WFL for tasks assessed/performed Overall Cognitive Status: Within Functional Limits for tasks assessed                                        Exercises      General Comments        Pertinent Vitals/Pain Pain Assessment: Faces Faces Pain Scale: Hurts little more Pain Location: left LE Pain Descriptors / Indicators: Guarding;Discomfort Pain Intervention(s): Monitored during session    Home Living                      Prior Function            PT Goals (current goals can now be found in the care plan section) Acute Rehab PT Goals PT Goal Formulation: With patient Time For Goal Achievement: 04/28/17 Potential to Achieve Goals: Good Progress towards PT goals: Progressing toward goals    Frequency    Min 3X/week  PT Plan Current plan remains appropriate    Co-evaluation              AM-PAC PT "6 Clicks" Daily Activity  Outcome Measure  Difficulty turning over in bed (including adjusting bedclothes, sheets and blankets)?: None Difficulty moving from lying on back to sitting on the side of the bed? : None Difficulty sitting down on and standing up from a chair with arms (e.g., wheelchair, bedside commode, etc,.)?: None Help needed moving to and from a bed to chair (including a wheelchair)?: None Help needed walking in hospital room?: A Little Help needed climbing 3-5 steps with a railing? : A Little 6 Click Score: 22    End of Session Equipment Utilized During Treatment: Gait belt;Oxygen Activity Tolerance: Patient limited by fatigue Patient  left: in chair;with call bell/phone within reach Nurse Communication: Mobility status PT Visit Diagnosis: Muscle weakness (generalized) (M62.81);Pain Pain - Right/Left: Left Pain - part of body: Leg     Time: 3710-6269 PT Time Calculation (min) (ACUTE ONLY): 30 min  Charges:  $Gait Training: 8-22 mins $Therapeutic Activity: 8-22 mins                    G Codes:       Earney Navy, PTA Pager: [phone]     Darliss Cheney 04/16/2017, 10:36 AM

## 2017-04-16 NOTE — Progress Notes (Signed)
Notified on call resident with note of 6 beat run of widen  QRS report by [employer], pt is asymptomatic, no new orders at this time Neta Mends RN 10:54 AM 04-16-2017

## 2017-04-16 NOTE — Progress Notes (Addendum)
Noted that patient has Medicare A&B but does not have prescription drug coverage - part D plan  CM attempted to talk to patient but he was on the telephone and was unable to talk. Noted patient was on Eliquis, he can apply for their medication assistance program for ongoing assistance; since patient has Medical Insurance, CM is unable to assist him with medication.  Primary Care Provider: Dhivianathan, Candida Peeling, MD Aneta Mins [phone]

## 2017-04-16 NOTE — Discharge Instructions (Addendum)
You are being discharged with the diagnosis of pulmonary edema secondary to pulmonary hypertension We are going to be halving your dose of lisinopril, secondary to low blood pressures You are ok to resume activity as tolerated prior to admission You are ok to resume your diet prior to admission Please follow up with your cardiologist as soon as possible Please follow up with your pcp in 1-2 weeks Please follow up with your pulmonologist regarding possible sleep apnea  Information on my medicine - XARELTO (rivaroxaban)  This medication education was reviewed with me or my healthcare representative as part of my discharge preparation.  The pharmacist that spoke with me during my hospital stay was:  Tad Moore, [place]? Xarelto was prescribed to treat blood clots that may have been found in the veins of your legs (deep vein thrombosis) or in your lungs (pulmonary embolism) and to reduce the risk of them occurring again.  What do you need to know about Xarelto? The starting dose is one 15 mg tablet taken TWICE daily with food for the FIRST 21 DAYS then on the dose is changed to one 20 mg tablet taken ONCE A DAY with your evening meal.  DO NOT stop taking Xarelto without talking to the health care provider who prescribed the medication.  Refill your prescription for 20 mg tablets before you run out.  After discharge, you should have regular check-up appointments with your healthcare provider that is prescribing your Xarelto.  In the future your dose may need to be changed if your kidney function changes by a significant amount.  What do you do if you miss a dose? If you are taking Xarelto TWICE DAILY and you miss a dose, take it as soon as you remember. You may take two 15 mg tablets (total 30 mg) at the same time then resume your regularly scheduled 15 mg twice daily the next day.  If you are taking Xarelto ONCE DAILY and you miss a dose, take it as  soon as you remember on the same day then continue your regularly scheduled once daily regimen the next day. Do not take two doses of Xarelto at the same time.   Important Safety Information Xarelto is a blood thinner medicine that can cause bleeding. You should call your healthcare provider right away if you experience any of the following: ? Bleeding from an injury or your nose that does not stop. ? Unusual colored urine (red or dark brown) or unusual colored stools (red or black). ? Unusual bruising for unknown reasons. ? A serious fall or if you hit your head (even if there is no bleeding).  Some medicines may interact with Xarelto and might increase your risk of bleeding while on Xarelto. To help avoid this, consult your healthcare provider or pharmacist prior to using any new prescription or non-prescription medications, including herbals, vitamins, non-steroidal anti-inflammatory drugs (NSAIDs) and supplements.  This website has more information on Xarelto: [email or web address]  Pulmonary Edema  Pulmonary edema (PE) is a condition in which fluid collects in the lungs. This makes it hard to breathe. PE may be a result of the heart not pumping very well or a result of injury. What are the causes?  Coronary artery disease causes blockages in the arteries of the heart. This deprives the heart muscle of oxygen and weakens the muscle. A heart attack is a form of coronary artery disease.  High blood pressure causes the heart muscle to work  harder than usual. Over time, the heart muscle may get stiff, and it starts to work less efficiently. It may also fatigue and weaken.  Viral infection of the heart (myocarditis) may weaken the heart muscle.  Metabolic conditions such as thyroid disease, excessive alcohol use, certain vitamin deficiencies, or diabetes may also weaken the heart muscle.  Leaky or stiff heart valves may impair normal heart function.  Lung disease may strain the heart  muscle.  Excessive demands on the heart such as too much salt or fluid intake.  Failure to take prescribed medicines.  Lung injury from heat or toxins, such as poisonous gas.  Infection in the lungs or other parts of the body.  Fluid overload caused by kidney failure or medicines. What are the signs or symptoms?  Shortness of breath at rest or with exertion.  Grunting, wheezing, or gurgling while breathing.  Feeling like you cannot get enough air.  Breaths are shallow and fast.  A lot of coughing with frothy or bloody mucus.  Skin may become cool, damp, and turn a pale or bluish color. How is this diagnosed? Initial diagnosis may be based on your history, symptoms, and a physical examination. Additional tests for PE may include:  Electrocardiography.  Chest X-ray.  Blood tests.  Stress test.  Ultrasound evaluation of the heart (echocardiography).  Evaluation by a heart doctor (cardiologist).  Test of the heart arteries to look for blockages (angiography).  Check of blood oxygen.  How is this treated? Treatment of PE will depend on the underlying cause and will focus first on relieving the symptoms.  Extra oxygen to make breathing easier and assist with removing mucus. This may include breathing treatments or a tube into the lungs and a breathing machine.  Medicine to help the body get rid of extra water, usually through an IV tube.  Medicine to help the heart pump better.  If poor heart function is the cause, treatment may include: ? Procedures to open blocked arteries, repair damaged heart valves, or remove some of the damaged heart muscle. ? A pacemaker to help the heart pump with less effort.  Follow these instructions at home:  Your health care provider will help you determine what type of exercise program may be helpful. It is important to maintain strength and increase it if possible. Pace your activities to avoid shortness of breath or chest pain.  Rest for at least 1 hour before and after meals. Cardiac rehabilitation programs are available in some locations.  Eat a heart-healthy diet low in salt, saturated fat, and cholesterol. Ask for help with choices.  Make a list of every medicine, vitamin, or herbal supplement you are taking. Keep the list with you at all times. Show it to your health care provider at every visit and before starting a new medicine. Keep the list up to date.  Ask your health care provider or pharmacist to help you write a plan or schedule so that you know things about each medicine such as: ? Why you are taking it. ? The possible side effects. ? The best time of day to take it. ? Foods to take with it or avoid. ? When to stop taking it.  Record your hospital or clinic weight. When you get home, compare it to your scale and record your weight. Then, weigh yourself first thing in the morning daily, and record the weights. You should weigh yourself every morning after you urinate and before you eat breakfast. Wear the same amount  of clothing each time you weigh yourself. Provide your health care provider with your weight record. Daily weights are important in the early recognition of excess fluid. Tell your health care provider right away if you have gained 3 lb (1.4 kg) in 1 day, 5 lb (2.3 kg) in a week, or as directed by your health care provider. Your medicines may need to be adjusted.  Blood pressure monitoring should be done as often as directed. You can get a home blood pressure cuff at your drugstore. Record these values and bring them with you for your clinic visits. Notify your health care provider if you become dizzy or light-headed when standing up.  If you are currently a smoker, it is time to quit. Nicotine makes your heart work harder and is one of the leading causes of cardiac deaths. Do not use nicotine gum or patches before talking to your doctor.  Make a follow-up appointment with your health care  provider as directed.  Ask your health care provider for a copy of your latest heart tracing (ECG) and keep a copy with you at all times. Get help right away if:  You have severe chest pain, especially if the pain is crushing or pressure-like and spreads to the arms, back, neck, or jaw. THIS IS AN EMERGENCY. Do not wait to see if the pain will go away. Call for local emergency medical help. Do not drive yourself to the hospital.  You have sweating, feel sick to your stomach (nauseous), or are experiencing shortness of breath.  Your weight increases by 3 lb (1.4 kg) in 1 day or 5 lb (2.3 kg) in a week.  You notice increasing shortness of breath that is unusual for you. This may happen during rest, sleep, or with activity.  You develop chest pain (angina) or pain that is unusual for you.  You notice more swelling in your hands, feet, ankles, or abdomen.  You notice lasting (persistent) dizziness, blurred vision, headache, or unsteadiness.  You begin to cough up bloody mucus (sputum).  You are unable to sleep because it is hard to breathe.  You begin to feel a jumping or fluttering sensation (palpitations) in the chest that is unusual for you. This information is not intended to replace advice given to you by your health care provider. Make sure you discuss any questions you have with your health care provider.

## 2017-04-17 LAB — BASIC METABOLIC PANEL
Anion gap: 6 (ref 5–15)
BUN: 21 mg/dL — AB (ref 6–20)
CHLORIDE: 103 mmol/L (ref 101–111)
CO2: 33 mmol/L — ABNORMAL HIGH (ref 22–32)
Calcium: 8.8 mg/dL — ABNORMAL LOW (ref 8.9–10.3)
Creatinine, Ser: 1.19 mg/dL (ref 0.61–1.24)
GFR calc Af Amer: >60 mL/min (ref >60)
GFR calc non Af Amer: >60 mL/min (ref >60)
GLUCOSE: 102 mg/dL — AB (ref 65–99)
POTASSIUM: 3.7 mmol/L (ref 3.5–5.1)
Sodium: 142 mmol/L (ref 135–145)

## 2017-04-17 LAB — GLUCOSE, CAPILLARY
GLUCOSE-CAPILLARY: 140 mg/dL — AB (ref 65–99)
Glucose-Capillary: 103 mg/dL — ABNORMAL HIGH (ref 65–99)
Glucose-Capillary: 125 mg/dL — ABNORMAL HIGH (ref 65–99)
Glucose-Capillary: 109 mg/dL — ABNORMAL HIGH (ref 65–99)

## 2017-04-17 LAB — HIV ANTIBODY (ROUTINE TESTING W REFLEX): HIV SCREEN 4TH GENERATION: NONREACTIVE

## 2017-04-17 MED ORDER — FUROSEMIDE 10 MG/ML IJ SOLN
80.0000 mg | Freq: Two times a day (BID) | INTRAMUSCULAR | Status: AC
  Administered 2017-04-17 – 2017-04-18 (×2): 80 mg via INTRAVENOUS
  Filled 2017-04-17 (×2): qty 8

## 2017-04-17 NOTE — Progress Notes (Addendum)
Family Medicine Teaching Service Daily Progress Note Intern Pager: [phone]  Patient name: Roberto [name] Medical record number: [id number] Date of birth: 1961/05/09 Age: 56 y.o. Gender: male  Primary Care Provider: Dhivianathan, Candida Peeling, MD Consultants: None Code Status: FULL  Pt Overview and Major Events to Date:  07/20 admit to FPTS; CTX x1, started azithro  07/21 DVT noted on LE Doppler  Assessment and Plan: Roberto [name] a 56 y.o.male admitted with dyspnea and left leg pain. PMH is significant for asthma/COPD, CHF, Type II DM, OSA,  LLE DVT, a.fib, HTN.  Dyspnea: Improved and back to baseline. Likely due to CHF exacerbation given CXR, elevated BNP (even with obesity) and improvement of symptoms with IV Lasix. PNA noted on CXR and CTA also possibly contributing. Admits dietary indiscretion. On home 5L and maintaining appropriate O2 sats. Echo performed 7/22 which revealed 60-65% EF with pulm hypertension. Received 2 doses of lasix 18m IV 7/24. Has diuresed over 3.5L/24hrs per charting. - Will continue iv lasix 868mBID today - Continue home torsemide - Strict I/O, monitor daily weights - Hold home lisinopril - Continue cardiac monitor - Continue home oxygen (5L) - Continue azithro for PNA treatment  Pneumonia: Exam with some crackles over RLL posteriorly. CTA with mild pneumonia in RLL. Received CTX and azithromycin ED. Remains afebrile with O2 sat in 90s on home 5L.  - On azithro day 5/5  DVT: Apparent DVT on LE Doppler in L mid-distal femoral vein and popliteal vein. Likely cause of patient's reported leg pain. CTA without PE.  - Continue home Xarelto, 1531mid for 21 days  - Xarelto for 75m59mce a day afterwards  CHF: Likely due to CHF exacerbation given CXR, elevated BNP (even with obesity) and improvement of symptoms with IV Lasix. PNA noted on CXR and CTA also possibly contributing. Admits dietary indiscretion. ~-4L net output output since admission after receiving  IV Lasix and home torsemide. On home 5L and maintaining appropriate O2 sats. Echo performed 7/22 which revealed 60-65% EF with pulm hypertension. - continue IV lasix BID 80mg38mContinue home torsemide - Strict I/O, monitor daily weights - Hold home lisinopril - Continue cardiac monitor - Continue home oxygen (5L) - Continue azithro for PNA treatment  Abnormal EKG: Afib, RAD, TWI in precordial and inferior leads. No prior EKG to compare to. Patient without chest pain but dyspnea. Troponin negative x3. Repeat EKG also with a.fib, R axis deviation, and ST wave abnormalities.  -A. fib management as below  Atrial fibrillation w/o RVR: chad-vasc score 6. Not on rate or rhythm control. Previously on Xarelto.  Denies history of GI bleed. HR 63-67 overnight.  - Continue home Xarelto - Cardiac monitor - Monitor vitals  AKI/?CKD: Serum creatinine 1.45 on admission. Unsure of his baseline. Cr improved to 1.19 this AM.  -Daily BMP -Hold home lisinopril  T2DM: CBG 103 this AM.  - Mod SSI - CBG qACHS - A1c pending  Asthma/COPD: No obvious prior formal diagnosis. Never smoker. On home 5L [place].  -PRN dounebs  Hypertension: Normotensive to hypotensive overnight (BP 104-118/58-61).  -Hold home lisinopril and amlodipine  OSA/?OHS -Nightly CPAP  Lung nodule: 7 mm solid-appearing nodule noted in the right lung on CTA. Follow up CT at 18-24 months recommended. Discussed this with the patient.  FEN/GI: heart healthy/diabetic diet  Prophylaxis: on full anticoagulation  Disposition: possibly home tomorrow  Subjective:  Doing well this afternoon. Educated patient on incidental finding of lung nodule. Patient feels like swelling has decreased  significantly in BLE. Feels like he is breathing better.  Objective: Temp:  [98.2 F (36.8 C)-98.4 F (36.9 C)] 98.2 F (36.8 C) (07/24 0423) Pulse Rate:  [63-67] 64 (07/24 0423) Resp:  [18] 18 (07/24 0423) BP: (108-148)/(60-77) 110/60 (07/24  0423) SpO2:  [92 %-97 %] 97 % (07/24 0423) Weight:  [380 lb 4.8 oz (172.5 kg)] 380 lb 4.8 oz (172.5 kg) (07/24 0423) Physical Exam: General: Resting comfortably in chair, no acute distress, aox3 Cardiovascular: normal rate, regular rate and rhythm, no murmurs, rubs, gallops appreciated Respiratory: CTAB though limited due to patient's body habitus. Normal WOB on 5L [place], able to speak in full sentences Abdomen: Obese, non-tender, +BS Extremities: Chronic venous stasis changes to LE bilaterally,decreased pitting edema BLE, TTP of LLE diffusely, no palpable cords Neuro: A&Ox3, no gross deficits Psych: appropriate mood and affect  Laboratory:  Recent Labs Lab 04/13/17 1701 04/14/17 0715 04/16/17 0409  WBC 5.7 5.4 6.0  HGB 14.6 13.5 13.4  HCT 47.7 43.9 44.4  PLT 193 191 192    Recent Labs Lab 04/13/17 1701  04/15/17 0209 04/16/17 0409 04/17/17 0338  NA 142  < > 141 138 142  K 3.8  < > 4.4 4.3 3.7  CL 103  < > 103 103 103  CO2 27  < > 30 25 33*  BUN 21*  < > 25* 23* 21*  CREATININE 1.45*  < > 1.37* 1.22 1.19  CALCIUM 9.1  < > 8.6* 8.6* 8.8*  PROT 7.2  --   --   --   --   BILITOT 1.2  --   --   --   --   ALKPHOS 75  --   --   --   --   ALT 15*  --   --   --   --   AST 23  --   --   --   --   GLUCOSE 141*  < > 130* 102* 102*  < > = values in this interval not displayed.  Imaging/Diagnostic Tests: No results found.   Guadalupe Dawn, MD 04/17/2017, 9:52 AM  PGY-1, [place] Intern pager: [phone], text pages welcome

## 2017-04-17 NOTE — Progress Notes (Signed)
Pt is alert and oriented with complaints of lower back pain gave tramadol and tylenol, currently sleep with on cap in Bed no distress.

## 2017-04-17 NOTE — Progress Notes (Signed)
Per CCMD, patient brady's in the 49s for brief periods. Happened twice.

## 2017-04-18 LAB — BASIC METABOLIC PANEL
Anion gap: 9 (ref 5–15)
BUN: 23 mg/dL — AB (ref 6–20)
CO2: 34 mmol/L — ABNORMAL HIGH (ref 22–32)
CREATININE: 1.32 mg/dL — AB (ref 0.61–1.24)
Calcium: 9 mg/dL (ref 8.9–10.3)
Chloride: 96 mmol/L — ABNORMAL LOW (ref 101–111)
GFR calc Af Amer: >60 mL/min (ref >60)
GFR, EST NON AFRICAN AMERICAN: 59 mL/min — AB (ref >60)
GLUCOSE: 102 mg/dL — AB (ref 65–99)
POTASSIUM: 4.4 mmol/L (ref 3.5–5.1)
SODIUM: 139 mmol/L (ref 135–145)

## 2017-04-18 LAB — HEMOGLOBIN A1C
HEMOGLOBIN A1C: 7.3 % — AB (ref 4.8–5.6)
Mean Plasma Glucose: 163 mg/dL

## 2017-04-18 LAB — GLUCOSE, CAPILLARY
GLUCOSE-CAPILLARY: 97 mg/dL (ref 65–99)
GLUCOSE-CAPILLARY: 133 mg/dL — AB (ref 65–99)
Glucose-Capillary: 127 mg/dL — ABNORMAL HIGH (ref 65–99)
Glucose-Capillary: 141 mg/dL — ABNORMAL HIGH (ref 65–99)

## 2017-04-18 MED ORDER — LISINOPRIL 10 MG PO TABS: 10.0000 mg | ORAL_TABLET | Freq: Every day | ORAL | 11 refills | Status: DC

## 2017-04-18 MED ORDER — RIVAROXABAN 15 MG PO TABS: 15.0000 mg | ORAL_TABLET | Freq: Two times a day (BID) | ORAL | 0 refills | Status: DC

## 2017-04-18 NOTE — Progress Notes (Signed)
Family Medicine Teaching Service Daily Progress Note Intern Pager: [phone]  Patient name: Exander Shaul Medical record number: [id number] Date of birth: [birth year] Age: 56 y.o. Gender: male  Primary Care Provider: Dhivianathan, Candida Peeling, MD Consultants: None Code Status: FULL  Pt Overview and Major Events to Date:  07/20 admit to FPTS; CTX x1, started azithro  07/21 DVT noted on LE Doppler  Assessment and Plan: Ranjit Ashurst a 56 y.o.male admitted with dyspnea and left leg pain. PMH is significant for asthma/COPD, CHF, Type II DM, OSA,  LLE DVT, a.fib, HTN.  Dyspnea: Improved and back to baseline. Likely due to CHF exacerbation given CXR, elevated BNP (even with obesity) and improvement of symptoms with IV Lasix. PNA noted on CXR and CTA also possibly contributing. Admits dietary indiscretion. On home 5L and maintaining appropriate O2 sats. Echo performed 7/22 which revealed 60-65% EF with pulm hypertension. Received 2 doses of lasix 31m IV 7/25. Has diuresed over 5.3L/24hrs per charting. Patient weight 371 today which is approaching lowest known weight of 363. Creatinine 1.19->1.32 today. Given slight bump in cr the patient is likely approaching dry weight. Will stop iv lasix. Likely ok for dc today. - Continue home torsemide - Strict I/O, monitor daily weights - Hold home lisinopril - Continue cardiac monitor - Continue home oxygen (5L) - Continue azithro for PNA treatment  Pneumonia: Exam with some crackles over RLL posteriorly. CTA with mild pneumonia in RLL. Received CTX and azithromycin ED. Remains afebrile with O2 sat in 90s on home 5L.  - On azithro day 5/5  DVT: Apparent DVT on LE Doppler in L mid-distal femoral vein and popliteal vein. Likely cause of patient's reported leg pain. CTA without PE.  - Continue home Xarelto, 138mbid for 21 days (7/22) - Xarelto for 2062mnce a day afterwards  CHF: Likely due to CHF exacerbation given CXR, elevated BNP (even with  obesity) and improvement of symptoms with IV Lasix. PNA noted on CXR and CTA also possibly contributing. Admits dietary indiscretion. ~-4L net output output since admission after receiving IV Lasix and home torsemide. On home 5L and maintaining appropriate O2 sats. Echo performed 7/22 which revealed 60-65% EF with pulm hypertension. - continue IV lasix BID 42m38m Continue home torsemide - Strict I/O, monitor daily weights - Hold home lisinopril - Continue cardiac monitor - Continue home oxygen (5L) - Continue azithro for PNA treatment  Abnormal EKG: Afib, RAD, TWI in precordial and inferior leads. No prior EKG to compare to. Patient without chest pain but dyspnea. Troponin negative x3. Repeat EKG also with a.fib, R axis deviation, and ST wave abnormalities.  -A. fib management as below  Atrial fibrillation w/o RVR: chad-vasc score 6. Not on rate or rhythm control. Previously on Xarelto.  Denies history of GI bleed. HR 63-67 overnight.  - Continue home Xarelto - Cardiac monitor - Monitor vitals  AKI/?CKD: Serum creatinine 1.45 on admission. Unsure of his baseline. Cr slightly increased to 1.32 -Daily BMP -Hold home lisinopril  T2DM: CBG 97 this AM.  - Mod SSI - CBG qACHS - A1c pending  Asthma/COPD: No obvious prior formal diagnosis. Never smoker. On home 5L [place].  -PRN dounebs  Hypertension: Normotensive to hypotensive overnight (BP 108/60).  -Hold home lisinopril and amlodipine  OSA/?OHS -Nightly CPAP  Lung nodule: 7 mm solid-appearing nodule noted in the right lung on CTA. Follow up CT at 18-24 months recommended. Discussed this with the patient.  FEN/GI: heart healthy/diabetic diet  Prophylaxis: on full anticoagulation  Disposition: possibly home tomorrow  Subjective:  Doing well this morning. Asleep on CPAP when entered the room. Breathing is much improved. Feels like his BLE edema has improved.  Objective: Temp:  [97.2 F (36.2 C)-98.7 F (37.1 C)] 98.2  F (36.8 C) (07/25 0445) Pulse Rate:  [64-80] 80 (07/25 0445) Resp:  [17-18] 17 (07/25 0445) BP: (108-128)/(60-83) 108/60 (07/25 0445) SpO2:  [93 %-94 %] 93 % (07/25 0445) Weight:  [371 lb 9.6 oz (168.6 kg)] 371 lb 9.6 oz (168.6 kg) (07/25 0445) Physical Exam: General: Resting comfortably in bed, on cpap, tolerated taking cpap off. no acute distress, aox3 Cardiovascular: normal rate, regular rate and rhythm, no murmurs, rubs, gallops appreciated Respiratory: CTAB though limited due to patient's body habitus. Normal WOB on 5L [place], able to speak in full sentences Abdomen: Obese, non-tender, +BS Extremities: Chronic venous stasis changes to LE bilaterally,decreased pitting edema BLE, TTP of LLE diffusely, no palpable cords Neuro: A&Ox3, no gross deficits Psych: appropriate mood and affect  Laboratory:  Recent Labs Lab 04/13/17 1701 04/14/17 0715 04/16/17 0409  WBC 5.7 5.4 6.0  HGB 14.6 13.5 13.4  HCT 47.7 43.9 44.4  PLT 193 191 192    Recent Labs Lab 04/13/17 1701  04/16/17 0409 04/17/17 0338 04/18/17 0533  NA 142  < > 138 142 139  K 3.8  < > 4.3 3.7 4.4  CL 103  < > 103 103 96*  CO2 27  < > 25 33* 34*  BUN 21*  < > 23* 21* 23*  CREATININE 1.45*  < > 1.22 1.19 1.32*  CALCIUM 9.1  < > 8.6* 8.8* 9.0  PROT 7.2  --   --   --   --   BILITOT 1.2  --   --   --   --   ALKPHOS 75  --   --   --   --   ALT 15*  --   --   --   --   AST 23  --   --   --   --   GLUCOSE 141*  < > 102* 102* 102*  < > = values in this interval not displayed.  Imaging/Diagnostic Tests: No results found.   Guadalupe Dawn, MD 04/18/2017, 8:57 AM  PGY-1, [place] Intern pager: [phone], text pages welcome

## 2017-04-18 NOTE — Progress Notes (Signed)
CM talked to patient concerning inability to afford Xarelto; patient stated that he has private insurance with [employer] with prescription drug coverage; his co pay for Xarelto is $47; CM talked to patient about the medication assistance program with Xarelto and for him to talk to his Cardiologist about his inability to afford the medication; Since patient has private insurance, CM is unable to assist the patient with his medication; patient did ask CM for a few doses of the medication - CM explained to the patient that the hospital is not allowed to do that since he has private insurance and the programs that the hospital have is for patient that do not have any insurance; patient agreed to talk to his Cardiologist to see if he has samples at his office; Aneta Mins [phone]

## 2017-04-18 NOTE — Progress Notes (Signed)
Telephone order from [facility] Residency Team to hold afternoon dose of Torsemide since patient has received morning dose of Lasix 80 mg Neta Mends RN 11:43 AM 04-18-2017

## 2017-04-18 NOTE — Progress Notes (Signed)
Pt is alert and oriented chronic A-fib brady throughout the night PRN given for back pain. Was effective last night. Vital otherwise stable.

## 2017-04-18 NOTE — Progress Notes (Signed)
Physical Therapy Treatment Patient Details Name: Roberto [name] MRN: [id number] DOB: 1961/07/19 Today's Date: 04/18/2017    History of Present Illness Roberto [name] is a 56 y.o. male  admitted with dyspnea and left leg pain. Positive for DVT left LE confirmed this pm.  PMH is significant for asthma/copd, chf, copd, t2dm, osa, dvt in left leg, afib, htn    PT Comments    Pt presented sitting OOB in chair when therapist arrived. Pt limited this session secondary to back pain, requesting pain meds prior to doing anymore than ambulating in room. Pt's RN was notified. Pt on 5L of supplemental O2 with SPO2 decreasing to as low as 74% with standing and moving about in his room, with very slow recovery to 90% with standing rest break. PT will continue to follow pt acutely to ensure a safe d/c home.    Follow Up Recommendations  No PT follow up;Supervision - Intermittent     Equipment Recommendations  None recommended by PT    Recommendations for Other Services       Precautions / Restrictions Precautions Precautions: Fall Precaution Comments: watch SPO2 Restrictions Weight Bearing Restrictions: No    Mobility  Bed Mobility               General bed mobility comments: OOB in chair upon arrival  Transfers Overall transfer level: Independent Equipment used: None                Ambulation/Gait Ambulation/Gait assistance: Supervision Ambulation Distance (Feet): 10 Feet Assistive device: None Gait Pattern/deviations: Step-through pattern;Decreased stride length;Wide base of support Gait velocity: decreased Gait velocity interpretation: Below normal speed for age/gender General Gait Details: pt limited and refusing to ambulated outside of his room at this time secondary to back pain.   Stairs            Wheelchair Mobility    Modified Rankin (Stroke Patients Only)       Balance Overall balance assessment: Needs assistance Sitting-balance support: No  upper extremity supported;Feet supported Sitting balance-Leahy Scale: Good     Standing balance support: No upper extremity supported;During functional activity Standing balance-Leahy Scale: Fair                              Cognition Arousal/Alertness: Awake/alert Behavior During Therapy: WFL for tasks assessed/performed Overall Cognitive Status: Within Functional Limits for tasks assessed                                        Exercises      General Comments        Pertinent Vitals/Pain Pain Assessment: Faces Faces Pain Scale: Hurts even more Pain Location: back Pain Descriptors / Indicators: Sore;Guarding;Grimacing Pain Intervention(s): Monitored during session;Repositioned;Patient requesting pain meds-RN notified    Home Living                      Prior Function            PT Goals (current goals can now be found in the care plan section) Acute Rehab PT Goals PT Goal Formulation: With patient Time For Goal Achievement: 04/28/17 Potential to Achieve Goals: Good Progress towards PT goals: Progressing toward goals    Frequency    Min 3X/week      PT Plan Current plan remains appropriate  Co-evaluation              AM-PAC PT "6 Clicks" Daily Activity  Outcome Measure  Difficulty turning over in bed (including adjusting bedclothes, sheets and blankets)?: None Difficulty moving from lying on back to sitting on the side of the bed? : None Difficulty sitting down on and standing up from a chair with arms (e.g., wheelchair, bedside commode, etc,.)?: None Help needed moving to and from a bed to chair (including a wheelchair)?: None Help needed walking in hospital room?: None Help needed climbing 3-5 steps with a railing? : A Little 6 Click Score: 23    End of Session Equipment Utilized During Treatment: Oxygen (5L) Activity Tolerance: Patient limited by fatigue;Patient limited by pain Patient left: in  chair;with call bell/phone within reach Nurse Communication: Mobility status;Patient requests pain meds PT Visit Diagnosis: Muscle weakness (generalized) (M62.81);Pain Pain - part of body:  (back)     Time: 1440-1455 PT Time Calculation (min) (ACUTE ONLY): 15 min  Charges:  $Therapeutic Activity: 8-22 mins                    G Codes:       [place], [place], [place] 04/18/2017, 4:07 PM

## 2017-04-18 NOTE — Progress Notes (Signed)
Day shift nurse had patient ready for discharge, patient is waiting for his ride to arrive. Patient stable while waiting, has nasal cannula on with oxygen going at 5l.

## 2017-04-18 NOTE — Progress Notes (Signed)
Heart Failure Navigator Consult Note  Presentation: per Dr Cyndia Skeeters; Roberto [name] is a 56 y.o. male presenting with one day history of shortness of breath. Laboratory findings and imaging consistent shortness of breath being caused by chf exacerbation. Given patients leg pain and history of dvts, a small PE undetectably by CTA is also on the differential. Other potential causes include pneumonia, copd exacerbation, and viral uri. These are all unlikely due to low wbc, afebrile, lack of physical exam findings, or time course inconsistent with each diagnosis.  Past Medical History:  Diagnosis Date  . Asthma   . CHF (congestive heart failure) ([place])   . COPD (chronic obstructive pulmonary disease) ([place])   . Diabetes mellitus without complication ([place])   . Hypertension     Social History   Social History  . Marital status: Single    Spouse name: N/A  . Number of children: N/A  . Years of education: N/A   Social History Main Topics  . Smoking status: Never Smoker  . Smokeless tobacco: Never Used  . Alcohol use No  . Drug use: No  . Sexual activity: Not Asked   Other Topics Concern  . None   Social History Narrative  . None   Study Conclusions--04/15/17  - Left ventricle: The cavity size was normal. There was severe   concentric hypertrophy. Systolic function was normal. The   estimated ejection fraction was in the range of 60% to 65%. Wall   motion was normal; there were no regional wall motion   abnormalities. - Aortic valve: There was trivial regurgitation. - Aorta: Ascending aorta diameter: 41 mm (ED). - Ascending aorta: The ascending aorta was mildly dilated. - Mitral valve: There was mild regurgitation. - Right ventricle: The cavity size was severely dilated. Wall   thickness was normal. Systolic function was severely reduced. - Atrial septum: There was increased thickness of the septum,   consistent with lipomatous hypertrophy. - Pulmonic valve: There was mild  regurgitation. - Pulmonary arteries: PA peak pressure: 86 mm Hg (S).  Impressions:  - The right ventricular systolic pressure was increased consistent   with severe pulmonary hypertension.  ------------------------------------------------------------------- Study data:  No prior study was available for comparison.  Study status:  Routine.  Procedure:  Transthoracic echocardiography. Image quality was fair. The study was technically difficult, as a result of poor acoustic windows and poor sound wave transmission. Study completion:  There were no complications. Echocardiography.  M-mode, complete 2D, spectral Doppler, and color Doppler.  Birthdate:  Patient birthdate: 02-28-1961.  Age:  Patient is 56 yr old.  Sex:  Gender: male.    BMI: 57.7 kg/m^2.  Blood pressure:     96/55  Patient status:  Inpatient.  Study date: Study date: 04/15/2017. Study time: 01:06 PM.  Location:  Bedside. ECHO:  BNP    Component Value Date/Time   BNP 274.0 (H) 04/13/2017 1941    ProBNP No results found for: PROBNP   Education Assessment and Provision:  Detailed education and instructions provided on heart failure disease management including the following:  Signs and symptoms of Heart Failure When to call the physician Importance of daily weights Low sodium diet Fluid restriction Medication management Anticipated future follow-up appointments  Patient education given on each of the above topics.  Patient acknowledges understanding and acceptance of all instructions.  I spoke with Roberto [name] regarding his HF and current hospitalization.  He has been "staying with his daughter in order to assist her with her 2 young children  and "help her get back on her feet".  He tells me that he has not been weighing due to the fact that he does not have a scale at home.  I have provided him a scale for home use.   I reviewed a low sodium diet and high sodium foods to avoid.  He denies any issues with getting or  taking prescribed medications except Xaralto--which he says was not covered by his insurance and therefore he quit taking.  He has that issue resolved and will begin taking this medication again.  He will follow with his cardiologist in [place].   Education Materials:  "Living Better With Heart Failure" Booklet, Daily Weight Tracker Tool    High Risk Criteria for Readmission and/or Poor Patient Outcomes:   EF <30%- 60-65%  2 or more admissions in 6 months- No  Difficult social situation- Denies  Demonstrates medication noncompliance-Denies   Barriers of Care:  Knowledge and compliance  Discharge Planning:   Plans to return to home with daughter in [place] temporarily.  His home is in [place].  He hopes to return there soon.

## 2017-05-03 ENCOUNTER — Emergency Department (HOSPITAL_COMMUNITY)
Admit: 2017-05-03 | Discharge: 2017-05-03 | Disposition: A | Discharge status: Home / Self Care | Payer: Medicare Other | Attending: Emergency Medicine | Admitting: Emergency Medicine

## 2017-05-03 ENCOUNTER — Telehealth (HOSPITAL_COMMUNITY): Payer: Self-pay | Admitting: Surgery

## 2017-05-03 ENCOUNTER — Emergency Department (HOSPITAL_COMMUNITY): Payer: Medicare Other

## 2017-05-03 ENCOUNTER — Inpatient Hospital Stay (HOSPITAL_COMMUNITY)
Admission: EM | Admit: 2017-05-03 | Discharge: 2017-05-09 | DRG: 300 | Disposition: A | Discharge status: Skilled Nursing Facility | Payer: Medicare Other | Attending: Family Medicine | Admitting: Family Medicine

## 2017-05-03 DIAGNOSIS — R001 Bradycardia, unspecified: Secondary | ICD-10-CM | POA: Diagnosis not present

## 2017-05-03 DIAGNOSIS — I5032 Chronic diastolic (congestive) heart failure: Secondary | ICD-10-CM | POA: Insufficient documentation

## 2017-05-03 DIAGNOSIS — R0600 Dyspnea, unspecified: Secondary | ICD-10-CM | POA: Diagnosis not present

## 2017-05-03 DIAGNOSIS — I82812 Embolism and thrombosis of superficial veins of left lower extremities: Secondary | ICD-10-CM | POA: Diagnosis present

## 2017-05-03 DIAGNOSIS — E119 Type 2 diabetes mellitus without complications: Secondary | ICD-10-CM | POA: Diagnosis present

## 2017-05-03 DIAGNOSIS — E1142 Type 2 diabetes mellitus with diabetic polyneuropathy: Secondary | ICD-10-CM | POA: Diagnosis not present

## 2017-05-03 DIAGNOSIS — I2729 Other secondary pulmonary hypertension: Secondary | ICD-10-CM | POA: Diagnosis present

## 2017-05-03 DIAGNOSIS — J9611 Chronic respiratory failure with hypoxia: Secondary | ICD-10-CM | POA: Diagnosis present

## 2017-05-03 DIAGNOSIS — I10 Essential (primary) hypertension: Secondary | ICD-10-CM

## 2017-05-03 DIAGNOSIS — M79605 Pain in left leg: Secondary | ICD-10-CM | POA: Diagnosis present

## 2017-05-03 DIAGNOSIS — E1122 Type 2 diabetes mellitus with diabetic chronic kidney disease: Secondary | ICD-10-CM | POA: Diagnosis present

## 2017-05-03 DIAGNOSIS — R911 Solitary pulmonary nodule: Secondary | ICD-10-CM | POA: Diagnosis present

## 2017-05-03 DIAGNOSIS — I2781 Cor pulmonale (chronic): Secondary | ICD-10-CM | POA: Diagnosis present

## 2017-05-03 DIAGNOSIS — I82432 Acute embolism and thrombosis of left popliteal vein: Secondary | ICD-10-CM | POA: Diagnosis present

## 2017-05-03 DIAGNOSIS — M25561 Pain in right knee: Secondary | ICD-10-CM | POA: Diagnosis present

## 2017-05-03 DIAGNOSIS — I4891 Unspecified atrial fibrillation: Secondary | ICD-10-CM | POA: Diagnosis present

## 2017-05-03 DIAGNOSIS — Z7901 Long term (current) use of anticoagulants: Secondary | ICD-10-CM

## 2017-05-03 DIAGNOSIS — I130 Hypertensive heart and chronic kidney disease with heart failure and stage 1 through stage 4 chronic kidney disease, or unspecified chronic kidney disease: Secondary | ICD-10-CM | POA: Diagnosis present

## 2017-05-03 DIAGNOSIS — I5042 Chronic combined systolic (congestive) and diastolic (congestive) heart failure: Secondary | ICD-10-CM | POA: Diagnosis present

## 2017-05-03 DIAGNOSIS — J441 Chronic obstructive pulmonary disease with (acute) exacerbation: Secondary | ICD-10-CM

## 2017-05-03 DIAGNOSIS — I878 Other specified disorders of veins: Secondary | ICD-10-CM | POA: Diagnosis present

## 2017-05-03 DIAGNOSIS — Z7984 Long term (current) use of oral hypoglycemic drugs: Secondary | ICD-10-CM

## 2017-05-03 DIAGNOSIS — I480 Paroxysmal atrial fibrillation: Secondary | ICD-10-CM

## 2017-05-03 DIAGNOSIS — N183 Chronic kidney disease, stage 3 (moderate): Secondary | ICD-10-CM | POA: Diagnosis present

## 2017-05-03 DIAGNOSIS — Z9981 Dependence on supplemental oxygen: Secondary | ICD-10-CM

## 2017-05-03 DIAGNOSIS — E662 Morbid (severe) obesity with alveolar hypoventilation: Secondary | ICD-10-CM | POA: Diagnosis present

## 2017-05-03 DIAGNOSIS — I82412 Acute embolism and thrombosis of left femoral vein: Secondary | ICD-10-CM | POA: Diagnosis present

## 2017-05-03 DIAGNOSIS — Z6841 Body Mass Index (BMI) 40.0 and over, adult: Secondary | ICD-10-CM | POA: Diagnosis not present

## 2017-05-03 DIAGNOSIS — I824Y2 Acute embolism and thrombosis of unspecified deep veins of left proximal lower extremity: Secondary | ICD-10-CM | POA: Diagnosis present

## 2017-05-03 DIAGNOSIS — I5022 Chronic systolic (congestive) heart failure: Secondary | ICD-10-CM | POA: Diagnosis not present

## 2017-05-03 DIAGNOSIS — I82819 Embolism and thrombosis of superficial veins of unspecified lower extremities: Secondary | ICD-10-CM | POA: Insufficient documentation

## 2017-05-03 LAB — COMPREHENSIVE METABOLIC PANEL
ALT: 16 U/L — ABNORMAL LOW (ref 17–63)
ANION GAP: 10 (ref 5–15)
AST: 27 U/L (ref 15–41)
Albumin: 3.9 g/dL (ref 3.5–5.0)
Alkaline Phosphatase: 80 U/L (ref 38–126)
BILIRUBIN TOTAL: 1.4 mg/dL — AB (ref 0.3–1.2)
BUN: 25 mg/dL — AB (ref 6–20)
CALCIUM: 9.4 mg/dL (ref 8.9–10.3)
CO2: 30 mmol/L (ref 22–32)
Chloride: 101 mmol/L (ref 101–111)
Creatinine, Ser: 1.33 mg/dL — ABNORMAL HIGH (ref 0.61–1.24)
GFR calc Af Amer: >60 mL/min (ref >60)
GFR, EST NON AFRICAN AMERICAN: 58 mL/min — AB (ref >60)
Glucose, Bld: 111 mg/dL — ABNORMAL HIGH (ref 65–99)
POTASSIUM: 4.1 mmol/L (ref 3.5–5.1)
Sodium: 141 mmol/L (ref 135–145)
TOTAL PROTEIN: 7.7 g/dL (ref 6.5–8.1)

## 2017-05-03 LAB — CBC WITH DIFFERENTIAL/PLATELET
Basophils Absolute: 0.1 10*3/uL (ref 0.0–0.1)
Basophils Relative: 0 %
Eosinophils Absolute: 0.1 10*3/uL (ref 0.0–0.7)
Eosinophils Relative: 1 %
HEMATOCRIT: 48.1 % (ref 39.0–52.0)
Hemoglobin: 15.5 g/dL (ref 13.0–17.0)
LYMPHS ABS: 0.6 10*3/uL — AB (ref 0.7–4.0)
LYMPHS PCT: 5 %
MCH: 28.7 pg (ref 26.0–34.0)
MCHC: 32.2 g/dL (ref 30.0–36.0)
MCV: 89.1 fL (ref 78.0–100.0)
MONO ABS: 1.4 10*3/uL — AB (ref 0.1–1.0)
MONOS PCT: 11 %
NEUTROS ABS: 10.5 10*3/uL — AB (ref 1.7–7.7)
Neutrophils Relative %: 83 %
Platelets: 351 10*3/uL (ref 150–400)
RBC: 5.4 MIL/uL (ref 4.22–5.81)
RDW: 15.3 % (ref 11.5–15.5)
WBC: 12.7 10*3/uL — ABNORMAL HIGH (ref 4.0–10.5)

## 2017-05-03 LAB — APTT: APTT: 44 s — AB (ref 24–36)

## 2017-05-03 LAB — HEPARIN LEVEL (UNFRACTIONATED): Heparin Unfractionated: >2.2 IU/mL — ABNORMAL HIGH (ref 0.30–0.70)

## 2017-05-03 LAB — BRAIN NATRIURETIC PEPTIDE: B NATRIURETIC PEPTIDE 5: 113.2 pg/mL — AB (ref 0.0–100.0)

## 2017-05-03 LAB — PROTIME-INR
INR: 1.76
Prothrombin Time: 20.8 seconds — ABNORMAL HIGH (ref 11.4–15.2)

## 2017-05-03 MED ORDER — HEPARIN (PORCINE) IN NACL 100-0.45 UNIT/ML-% IJ SOLN
2150.0000 [IU]/h | INTRAMUSCULAR | Status: DC
  Administered 2017-05-03: 1800 [IU]/h via INTRAVENOUS
  Filled 2017-05-03 (×2): qty 250

## 2017-05-03 MED ORDER — OXYCODONE-ACETAMINOPHEN 5-325 MG PO TABS
2.0000 | ORAL_TABLET | Freq: Once | ORAL | Status: AC
  Administered 2017-05-03: 2 via ORAL
  Filled 2017-05-03: qty 2

## 2017-05-03 NOTE — Telephone Encounter (Signed)
Patient called to let me know that he was having left leg numbness--recurring since he had been discharged from hospital.  He states that he is unable to stand on that leg and is having trouble moving around. He does say that he has been taking all medications prescribed at the time of discharge.   I encouraged him to come back in to the ED as this increasing leg numbness is not normal and needs to be evaluated.  He has plans to call EMS as he is unsure that he can navigate 2 sets of stairs necessary to get down to get into car for transport to the hospital.   He does have plans to come to be evaluated in the ED today.

## 2017-05-03 NOTE — Progress Notes (Signed)
VASCULAR LAB PRELIMINARY  PRELIMINARY  PRELIMINARY  PRELIMINARY  Left lower extremity venous duplex completed.    Preliminary report:  Positive for subacute DVT of the left femoral and popliteal veins. No evidence of propagation of the previous DVT. There is new evidence of a superficial thrombosis of the left greater saphenous vein coursing from the ankle through the mid thigh. No evidence of a Baker's cyst.  Nyemah Watton, [place], RVS 05/03/2017, 8:28 PM

## 2017-05-03 NOTE — H&P (Signed)
[facility] Admission History and Physical Service Pager: [phone]  Patient name: Roberto [name] Medical record number: [id number] Date of birth: [birth year] Age: 56 y.o. Gender: male  Primary Care Provider: Dhivianathan, Candida Peeling, MD Consultants: Vascular Code Status: full  Chief Complaint: leg pain  Assessment and Plan: Roberto [name] is a 56 y.o. male presenting with 5 days left leg pain. PMH is significant for recurrent DVT, Afib, CHF (EF 60-65% with severe concentric hypertrophy), OSA on CPAP, HTN.  L leg pain with thrombosis of L greater saphenous vein: Darkening of LLE and asymmetric swelling compared to R. Had acute DVT of left femoral vein and left popliteal vein during last hospitalization and repeat LE doppler today showed no propagation but superficial thrombosis of L greater saphenous vein from ankle to mid thigh. Palpable pulses so not suspecting compartment syndrome. Mild leukocytosis and having fevers/chills but more likely attributable to clot burden. ED physician spoke with  Vascular Surgeon Dr. Bridgett Larsson who reported that due to short half life of xarelto they occasionally see clots in those who are not strict on their medication timing. Not considering clot-busting procedures due to risk. Started on heparin in [place] with plan to transition to warfarin, as he is not a candidate for lovenox bridging due to mild CKD. INR 1.76 at [facility].  -admit to telemetry, Attending Dr. McDiarmid -vascular consulted, recommended transition to coumadin -coumadin per pharmacy -heparin per pharmacy -routine pulse/sensation checks  -tramadol 50 mg q6h prn and tylenol 1000 mg q6h prn for pain control -PT/OT eval -Follow INR  Chronic Dyspnea: Improved since last hospitalization per patient. Weight down since discharge (260 lbs vs 271 lbs).  - Continue hometorsemide 40 mg QHS - Strict I/O, monitor daily weights - Continue cardiac monitor - Continue home oxygen (5L)    CHF:  Home torsemide. On home 5L for chronic respiratory failure and maintaining appropriate O2 sats. Echo performed 7/22 which revealed 60-65% EF with pulm hypertension. - Continue hometorsemide - Strict I/O, monitor daily weights - Continue home lisinopril at 10 mg (decreased during last admission) - Continue cardiac monitor - Continue home oxygen (5L)  Atrial fibrillation w/o RVR: chad-vasc score 6. Not on rate or rhythm control. Previously on Xarelto. Denies history of GI bleed. HR mid 60s during intake examination - stop home Xarelto - coumadin per pharmacy - cardiac monitor - Monitor vitals  AKI/?CKD: Unsure of his baseline (~1.2?). Cr slightly increased to 1.33 -Daily BMP -avoid NSAIDs -monitor weights and would not try to further diurese as below last discharge weight -Check CK as has been less mobile due to pain  T2DM: CBG 111 in [facility] last PM. Last A1c 7.3 on 04/14/17. Controlled on metformin and glyburide at home.  - sensitive SSI insulin and qachs CBGs ordered  Asthma/COPD: No prior formal diagnosis. Never smoker. No wheezing on exam.  On home 5L [place].  -PRN dounebs -would refer to Pulmonology outpatient, as also had pulmonary nodule noted during last hospitalization that will require monitoring and patient suspects CPAP machine will need replacing soon  Hypertension: 109-158/62-93 -Continue home lisinopril and amlodipine  OSA/?OHS -Nightly CPAP  Lung nodule:7 mm solid-appearing nodule noted in the right lung on CTA. Follow up CT at 18-24 months recommended.   FEN/GI:heart healthy/carb modified diet  Prophylaxis: on full anticoagulation  Disposition: pending therapeutic INR and PT/OT evals  History of Present Illness:  Roberto [name] is a 56 y.o. male presenting with 5 days of leg pain and swelling since his last hospitalization,  which was for CHF exacerbation, CAP, and DVT.   Per ED, he largely laid in bed with discomfort since then and says he  cannot walk on the leg.  He has been consistent with home O2 at 5L nasal canula and xarelto since last discharge.   He largely laid in bed for the 3 days between admissions due the pain which he says is worse if he plantarflexes his foot or bends his knee.   He says he can still feel and move his toes/foot but that he does not have equal sensation his feet and says L foot feels "like it's wrapped in plastic."  Friday and Saturday he walked around more than he had since being home then didn't do much Sunday. Leg has been numb for about 5 days. At first was the whole lower leg and now is only the front of his leg. Says pain was so bad he couldn't stand on the leg, which led him to get evaluated. Says weight has not increased since last discharge from hospital.   Review Of Systems: Per HPI with the following additions:   Review of Systems  Constitutional: Positive for chills and fever.  HENT: Negative for congestion and ear pain.   Eyes: Negative for blurred vision and pain.  Respiratory: Negative for cough, sputum production and shortness of breath.   Cardiovascular: Positive for leg swelling. Negative for chest pain.  Gastrointestinal: Positive for nausea. Negative for abdominal pain and vomiting.  Genitourinary: Negative for dysuria and frequency.  Musculoskeletal: Negative for back pain, falls and neck pain.  Neurological: Positive for sensory change (numb L leg). Negative for headaches.    Patient Active Problem List   Diagnosis Date Noted  . Saphenous vein clot 05/03/2017  . Chronic respiratory failure with hypoxia ([place])   . Acute deep vein thrombosis (DVT) of proximal vein of left lower extremity ([place])   . SOB (shortness of breath) 04/14/2017  . Acute on chronic congestive heart failure ([place])   . Left leg pain   . Shortness of breath   . Atrial fibrillation ([place])   . Community acquired pneumonia     Past Medical History: Past Medical History:  Diagnosis Date  . Asthma   . CHF  (congestive heart failure) ([place])   . COPD (chronic obstructive pulmonary disease) ([place])   . Diabetes mellitus without complication ([place])   . Hypertension     Past Surgical History: Past Surgical History:  Procedure Laterality Date  . SHOULDER SURGERY      Social History: Social History  Substance Use Topics  . Smoking status: Never Smoker  . Smokeless tobacco: Never Used  . Alcohol use No   Additional social history: Has been living alone in [place] but was with daughter since leaving hospital. Please also refer to relevant sections of EMR.  Family History: No family history on file. Mother, father, and brothers with diabetes.   Allergies and Medications: No Known Allergies No current facility-administered medications on file prior to encounter.    Current Outpatient Prescriptions on File Prior to Encounter  Medication Sig Dispense Refill  . amLODipine (NORVASC) 5 MG tablet Take 5 mg by mouth daily.  1  . cholecalciferol (VITAMIN D) 1000 units tablet Take 1,000 Units by mouth daily.  1  . ferrous sulfate 325 (65 FE) MG tablet Take 325 mg by mouth daily.    Marland Kitchen glyBURIDE (DIABETA) 5 MG tablet Take 5 mg by mouth at bedtime as needed (high blood sugar).  0  . lisinopril (PRINIVIL,ZESTRIL) 10 MG tablet Take 1 tablet (10 mg total) by mouth daily. 30 tablet 11  . metFORMIN (GLUCOPHAGE) 1000 MG tablet Take 1,000 mg by mouth 2 (two) times daily.  1  . Multiple Vitamin (MULTIVITAMIN WITH MINERALS) TABS tablet Take 1 tablet by mouth daily.    . OXYGEN Inhale 5 L into the lungs continuous.    . Rivaroxaban (XARELTO) 15 MG TABS tablet Take 1 tablet (15 mg total) by mouth 2 (two) times daily with a meal. 36 tablet 0  . torsemide (DEMADEX) 20 MG tablet Take 40 mg by mouth at bedtime.  5  . naproxen sodium (ALEVE) 220 MG tablet Take 220 mg by mouth every 12 (twelve) hours as needed (pain).    Marland Kitchen PRESCRIPTION MEDICATION Inhale into the lungs as needed (whenever sleeping - naps or at night).  CPAP      Objective: BP 107/65   Pulse 65   Temp 100.1 F (37.8 C) (Oral)   Resp (!) 21   Wt (!) 371 lb 11.1 oz (168.6 kg)   SpO2 93%   BMI 54.89 kg/m  Exam: General: alert and conversational, appears tired and uncomfortable Eyes: PERRL, EOMI ENTM: no nasal discharge, conjunctivitis, MMM Cardiovascular: irregular heart rate (afib showing on monitor), but ventricular rate was ~60-70, no murmur appreciated though difficult exam due to habitus; palpable DP pulse of LLE and faint PT pulse.  Respiratory: satting mid 90s on O2 nasal canula, no increased WOB, CTAB Gastrointestinal: soft belly with no tendernesss to deep palpation, bowel sounds in 4 quadrants  MSK: significant asymmetrical swelling/discoloration in the left lower leg. No pitting edema of LLE. Chronic venous stasis changes. Derm: skin changes including color to deep purple/black/red, texture to thick on left lower leg.  Patient complained of pain in knee while plantarflexing L foot and extending knee. Neuro: slightly decreased sensation on L lower leg, negative Babinski  Psych: appropriate and pleasant  Labs and Imaging: CBC BMET   Recent Labs Lab 05/03/17 1730  WBC 12.7*  HGB 15.5  HCT 48.1  PLT 351    Recent Labs Lab 05/03/17 1730  NA 141  K 4.1  CL 101  CO2 30  BUN 25*  CREATININE 1.33*  GLUCOSE 111*  CALCIUM 9.4     Dg Chest 2 View  Result Date: 05/03/2017 CLINICAL DATA:  Low-grade fever.  Recent pneumonia. EXAM: CHEST  2 VIEW COMPARISON:  04/13/2017 FINDINGS: Moderate cardiomegaly and chronic pulmonary vascular congestion are stable in appearance. No evidence of acute infiltrate or pleural effusion. IMPRESSION: Stable cardiomegaly and chronic pulmonary vascular congestion. No acute findings . Electronically Signed   By: Earle Gell M.D.   On: 05/03/2017 18:45    Sherene Sires, DO 05/03/2017, 10:01 PM PGY-1, [place] Intern pager: [phone], text pages welcome  Upper Level  Addendum:  I have seen and evaluated this patient along with Dr. Criss Rosales and reviewed the above note, making necessary revisions in green.  Rogue Bussing, MD PGY-3,  [facility] 05/04/2017 7:04 AM

## 2017-05-03 NOTE — Progress Notes (Signed)
ANTICOAGULATION CONSULT NOTE - Initial Consult  Pharmacy Consult for heparin (rivaroxaban prior to admission) Indication: DVT  No Known Allergies  Patient Measurements: Weight: (!) 371 lb 11.1 oz (168.6 kg) Heparin Dosing Weight: 112kg  Vital Signs: Temp: 100.1 F (37.8 C) (08/09 1651) Temp Source: Oral (08/09 1651) BP: 111/63 (08/09 1651) Pulse Rate: 74 (08/09 1651)  Labs:  Recent Labs  05/03/17 1730  HGB 15.5  HCT 48.1  PLT 351  CREATININE 1.33*    Estimated Creatinine Clearance: 96.4 mL/min (A) (by C-G formula based on SCr of 1.33 mg/dL (H)).   Medical History: Past Medical History:  Diagnosis Date  . Asthma   . CHF (congestive heart failure) ([place])   . COPD (chronic obstructive pulmonary disease) ([place])   . Diabetes mellitus without complication ([place])   . Hypertension    Assessment: 75 YOM recently hospitalized at [facility] for leg pain and swelling with SOB.  He was found to have DVT in LLE without evidence of PE. He had been on rivaroxaban for previous DVT and afib but appears had stopped several months prior to his July admission.  Mr. Wainer [facility] to [facility] 8/9 with LLE pain and numbness.  LE dopplers reveal new superficial thrombosis in LLE.    Last dose of rivaroxaban was 8/9 at 11am - he was to be on rivaroxaban 3m BID until 8/12 then 221mdaily  Baseline aPTT, anti-Xa level (ie. Heparin level) and INR pending  Today, 05/03/2017  Renal: mildy inc SCr but consistent with previous results  CBC: Hgb and pltc WNL  Goal of Therapy:  Heparin level 0.3-0.7 units/ml  APTT 66-102 sec Monitor platelets by anticoagulation protocol: Yes   Plan:   Once baseline labs collected, start Heparin (no bolus for recent rivaroxaban) 1800 units/hr  Check 6h aPTT  Dose per aPTT until correlates with anti-Xa level (rivaroxaban increases anti-Xa via mechanism of action)  Daily heparin level, aPTT, and CBC  Monitor for bleeding  Await plan for long-term  anticoagualtion  DuDoreene ElandPharmD, BCPS.   Pager: [phone]/05/2017 9:33 PM

## 2017-05-03 NOTE — ED Provider Notes (Signed)
[facility] DEPT Provider Note   CSN: [id number] Arrival date & time: 05/03/17  1413     History   Chief Complaint Chief Complaint  Patient presents with  . Leg Pain    HPI Roberto [name] is a 56 y.o. male.  HPI   50 old male with past medical history of hypertension, diabetes, CHF, COPD who presents with left leg pain. The patient was recently admitted for DVT of the left lower extremity with CHF exacerbation. He was noted to have a large femoral DVT at that time and is placed on thrill today. He states that over the last week to 2 weeks, he has had progressively worsening swelling and pain of the leg. He has had difficulty walking due to this pain. He has also had associated occasional numbness along the anterior aspect of his leg. He states that upon returning from the hospital, he was unable to get out of bed for the next 3 days. He has had persistent difficulty ambulating since then. He states he has been monitoring his weights and believes that he is actually down from his normal weight and does not feel like he has to much fluid on them, other than in his left leg. He has been taking his Xarelto as prescribed.  Past Medical History:  Diagnosis Date  . Asthma   . CHF (congestive heart failure) ([place])   . COPD (chronic obstructive pulmonary disease) ([place])   . Diabetes mellitus without complication ([place])   . Hypertension     Patient Active Problem List   Diagnosis Date Noted  . Saphenous vein clot 05/03/2017  . Chronic respiratory failure with hypoxia ([place])   . Acute deep vein thrombosis (DVT) of proximal vein of left lower extremity ([place])   . SOB (shortness of breath) 04/14/2017  . Acute on chronic congestive heart failure ([place])   . Left leg pain   . Shortness of breath   . Atrial fibrillation ([place])   . Community acquired pneumonia     Past Surgical History:  Procedure Laterality Date  . SHOULDER SURGERY         Home Medications    Prior to Admission  medications   Medication Sig Start Date End Date Taking? Authorizing Provider  acetaminophen (TYLENOL) 500 MG tablet Take 1,000 mg by mouth every 6 (six) hours as needed for moderate pain.   Yes [provider]  amLODipine (NORVASC) 5 MG tablet Take 5 mg by mouth daily. 03/19/17  Yes [provider]  cholecalciferol (VITAMIN D) 1000 units tablet Take 1,000 Units by mouth daily. 02/15/17  Yes [provider]  ferrous sulfate 325 (65 FE) MG tablet Take 325 mg by mouth daily.   Yes [provider]  glyBURIDE (DIABETA) 5 MG tablet Take 5 mg by mouth at bedtime as needed (high blood sugar).  03/28/17  Yes [provider]  lisinopril (PRINIVIL,ZESTRIL) 10 MG tablet Take 1 tablet (10 mg total) by mouth daily. 04/18/17 04/18/18 Yes Guadalupe Dawn, MD  metFORMIN (GLUCOPHAGE) 1000 MG tablet Take 1,000 mg by mouth 2 (two) times daily. 02/26/17  Yes [provider]  Multiple Vitamin (MULTIVITAMIN WITH MINERALS) TABS tablet Take 1 tablet by mouth daily.   Yes [provider]  OXYGEN Inhale 5 L into the lungs continuous.   Yes [provider]  Rivaroxaban (XARELTO) 15 MG TABS tablet Take 1 tablet (15 mg total) by mouth 2 (two) times daily with a meal. 04/18/17  Yes Guadalupe Dawn, MD  torsemide (  DEMADEX) 20 MG tablet Take 40 mg by mouth at bedtime. 03/25/17  Yes [provider]  naproxen sodium (ALEVE) 220 MG tablet Take 220 mg by mouth every 12 (twelve) hours as needed (pain).    [provider]  PRESCRIPTION MEDICATION Inhale into the lungs as needed (whenever sleeping - naps or at night). CPAP    [provider]    Family History No family history on file.  Social History Social History  Substance Use Topics  . Smoking status: Never Smoker  . Smokeless tobacco: Never Used  . Alcohol use No     Allergies   Patient has no known allergies.   Review of Systems Review of Systems  Constitutional: Positive for  fatigue. Negative for chills and fever.  HENT: Negative for congestion and rhinorrhea.   Eyes: Negative for visual disturbance.  Respiratory: Negative for cough, shortness of breath and wheezing.   Cardiovascular: Positive for leg swelling. Negative for chest pain.  Gastrointestinal: Negative for abdominal pain, diarrhea, nausea and vomiting.  Genitourinary: Negative for dysuria and flank pain.  Musculoskeletal: Positive for gait problem. Negative for neck pain and neck stiffness.  Skin: Negative for rash and wound.  Allergic/Immunologic: Negative for immunocompromised state.  Neurological: Positive for weakness. Negative for syncope and headaches.  All other systems reviewed and are negative.    Physical Exam Updated Vital Signs BP 110/70   Pulse 79   Temp 100.1 F (37.8 C) (Oral)   Resp (!) 26   Wt (!) 168.6 kg (371 lb 11.1 oz)   SpO2 91%   BMI 54.89 kg/m   Physical Exam  Constitutional: He is oriented to person, place, and time. He appears well-developed and well-nourished. No distress.  Obese male  HENT:  Head: Normocephalic and atraumatic.  Eyes: Conjunctivae are normal.  Neck: Neck supple.  Cardiovascular: Normal rate, regular rhythm and normal heart sounds.  Exam reveals no friction rub.   No murmur heard. Pulmonary/Chest: Effort normal and breath sounds normal. No respiratory distress. He has no wheezes. He has no rales.  On oxygen via nasal canula  Abdominal: Soft. He exhibits no distension.  Musculoskeletal: He exhibits no edema.  Neurological: He is alert and oriented to person, place, and time. He exhibits normal muscle tone.  Skin: Skin is warm. Capillary refill takes less than 2 seconds.  Psychiatric: He has a normal mood and affect.  Nursing note and vitals reviewed.   LOWER EXTREMITY EXAM: LEFT  INSPECTION & PALPATION: Marked edema throughout LLE. Palpable cord along saphenous vein from thigh to lower leg. No redness/erythema/open wounds.  SENSORY:  sensation is intact to light touch in:  Superficial peroneal nerve distribution (over dorsum of foot) Deep peroneal nerve distribution (over first dorsal web space) Sural nerve distribution (over lateral aspect 5th metatarsal) Saphenous nerve distribution (over medial instep)  MOTOR:  + Motor EHL (great toe dorsiflexion) + FHL (great toe plantar flexion)  + TA (ankle dorsiflexion)  + GSC (ankle plantar flexion)  VASCULAR: 2+ dorsalis pedis and posterior tibialis pulses Capillary refill < 2 sec, toes warm and well-perfused  COMPARTMENTS: Soft, warm, well-perfused No pain with passive extension No parethesias     ED Treatments / Results  Labs (all labs ordered are listed, but only abnormal results are displayed) Labs Reviewed  CBC WITH DIFFERENTIAL/PLATELET - Abnormal; Notable for the following:       Result Value   WBC 12.7 (*)    Neutro Abs 10.5 (*)    Lymphs Abs  0.6 (*)    Monocytes Absolute 1.4 (*)    All other components within normal limits  COMPREHENSIVE METABOLIC PANEL - Abnormal; Notable for the following:    Glucose, Bld 111 (*)    BUN 25 (*)    Creatinine, Ser 1.33 (*)    ALT 16 (*)    Total Bilirubin 1.4 (*)    GFR calc non Af Amer 58 (*)    All other components within normal limits  BRAIN NATRIURETIC PEPTIDE - Abnormal; Notable for the following:    B Natriuretic Peptide 113.2 (*)    All other components within normal limits  PROTIME-INR - Abnormal; Notable for the following:    Prothrombin Time 20.8 (*)    All other components within normal limits  APTT - Abnormal; Notable for the following:    aPTT 44 (*)    All other components within normal limits  HEPARIN LEVEL (UNFRACTIONATED) - Abnormal; Notable for the following:    Heparin Unfractionated >2.20 (*)    All other components within normal limits  APTT  CBC    EKG  EKG Interpretation None       Radiology Dg Chest 2 View  Result Date: 05/03/2017 CLINICAL DATA:  Low-grade fever.  Recent  pneumonia. EXAM: CHEST  2 VIEW COMPARISON:  04/13/2017 FINDINGS: Moderate cardiomegaly and chronic pulmonary vascular congestion are stable in appearance. No evidence of acute infiltrate or pleural effusion. IMPRESSION: Stable cardiomegaly and chronic pulmonary vascular congestion. No acute findings . Electronically Signed   By: Earle Gell M.D.   On: 05/03/2017 18:45    Procedures Procedures (including critical care time)  Medications Ordered in ED Medications  heparin ADULT infusion 100 units/mL (25000 units/248m sodium chloride 0.45%) (1,800 Units/hr Intravenous New Bag/Given 05/03/17 2301)  oxyCODONE-acetaminophen (PERCOCET/ROXICET) 5-325 MG per tablet 2 tablet (2 tablets Oral Given 05/03/17 1744)     Initial Impression / Assessment and Plan / ED Course  I have reviewed the triage vital signs and the nursing notes.  Pertinent labs & imaging results that were available during my care of the patient were reviewed by me and considered in my medical decision making (see chart for details).     56year old male here with worsening left leg pain and swelling. Patient was just diagnosed with a DVT and placed on several toe. On my exam, he has significant asymmetric edema of the extremity. Distal capillary refill and pulses intact without signs of significant phlegmasia. On repeat ultrasound, the patient has new, large saphenous vein thrombosis extending from the ankle to the proximal thigh. I discussed the case with Dr. CBridgett Larssonof vascular surgery. He recommends transition the patient to warfarin. Given patient's baseline CK D, he is not a candidate for Lovenox bridge. Will start on heparin drip and admit for close monitoring and transition to warfarin.  This note was prepared with assistance of [employer] analyst Occasional wrong-word or sound-a-like substitutions may have occurred due to the inherent limitations of voice recognition software.   Final Clinical Impressions(s) / ED  Diagnoses   Final diagnoses:  Acute deep vein thrombosis (DVT) of femoral vein of left lower extremity (HCC)    New Prescriptions New Prescriptions   No medications on file     IDuffy Bruce MD 05/03/17 2335

## 2017-05-03 NOTE — ED Triage Notes (Signed)
Pt c/o L leg pain and numbness. Hx of same and recent DVT dx. Alert and oriented. Denies SOB/cp.

## 2017-05-03 NOTE — ED Notes (Signed)
[place] in with pt.

## 2017-05-03 NOTE — ED Notes (Signed)
Pt placed on telemetry, continuous pulse ox.  Pt remains on humidified oxygen @ 4 L/[place].

## 2017-05-03 NOTE — ED Notes (Signed)
Bed: MH68 Expected date:  Expected time:  Means of arrival:  Comments: Room 26

## 2017-05-03 NOTE — ED Notes (Signed)
Bed: HO88 Expected date:  Expected time:  Means of arrival:  Comments:

## 2017-05-03 NOTE — ED Notes (Signed)
Pt lying in bed watching TV.

## 2017-05-04 DIAGNOSIS — E1142 Type 2 diabetes mellitus with diabetic polyneuropathy: Secondary | ICD-10-CM

## 2017-05-04 DIAGNOSIS — I824Y2 Acute embolism and thrombosis of unspecified deep veins of left proximal lower extremity: Secondary | ICD-10-CM

## 2017-05-04 DIAGNOSIS — I5032 Chronic diastolic (congestive) heart failure: Secondary | ICD-10-CM | POA: Insufficient documentation

## 2017-05-04 DIAGNOSIS — I2781 Cor pulmonale (chronic): Secondary | ICD-10-CM

## 2017-05-04 DIAGNOSIS — J441 Chronic obstructive pulmonary disease with (acute) exacerbation: Secondary | ICD-10-CM

## 2017-05-04 DIAGNOSIS — I82812 Embolism and thrombosis of superficial veins of left lower extremities: Principal | ICD-10-CM

## 2017-05-04 LAB — CBC
HEMATOCRIT: 46.8 % (ref 39.0–52.0)
Hemoglobin: 14.3 g/dL (ref 13.0–17.0)
MCH: 27.8 pg (ref 26.0–34.0)
MCHC: 30.6 g/dL (ref 30.0–36.0)
MCV: 90.9 fL (ref 78.0–100.0)
Platelets: 298 10*3/uL (ref 150–400)
RBC: 5.15 MIL/uL (ref 4.22–5.81)
RDW: 15.6 % — ABNORMAL HIGH (ref 11.5–15.5)
WBC: 9.2 10*3/uL (ref 4.0–10.5)

## 2017-05-04 LAB — GLUCOSE, CAPILLARY
GLUCOSE-CAPILLARY: 150 mg/dL — AB (ref 65–99)
GLUCOSE-CAPILLARY: 130 mg/dL — AB (ref 65–99)
Glucose-Capillary: 155 mg/dL — ABNORMAL HIGH (ref 65–99)
Glucose-Capillary: 116 mg/dL — ABNORMAL HIGH (ref 65–99)
Glucose-Capillary: 139 mg/dL — ABNORMAL HIGH (ref 65–99)

## 2017-05-04 LAB — CK: Total CK: 35 U/L — ABNORMAL LOW (ref 49–397)

## 2017-05-04 LAB — APTT
APTT: 70 s — AB (ref 24–36)
aPTT: 50 seconds — ABNORMAL HIGH (ref 24–36)
aPTT: 58 seconds — ABNORMAL HIGH (ref 24–36)

## 2017-05-04 MED ORDER — INSULIN ASPART 100 UNIT/ML ~~LOC~~ SOLN
0.0000 [IU] | Freq: Three times a day (TID) | SUBCUTANEOUS | Status: DC
  Administered 2017-05-04: 1 [IU] via SUBCUTANEOUS
  Administered 2017-05-04: 2 [IU] via SUBCUTANEOUS
  Administered 2017-05-05 – 2017-05-06 (×5): 1 [IU] via SUBCUTANEOUS
  Administered 2017-05-07: 2 [IU] via SUBCUTANEOUS
  Administered 2017-05-07 (×2): 1 [IU] via SUBCUTANEOUS
  Administered 2017-05-08 (×2): 2 [IU] via SUBCUTANEOUS
  Administered 2017-05-08 – 2017-05-09 (×2): 1 [IU] via SUBCUTANEOUS
  Administered 2017-05-09: 2 [IU] via SUBCUTANEOUS

## 2017-05-04 MED ORDER — WARFARIN SODIUM 10 MG PO TABS
10.0000 mg | ORAL_TABLET | Freq: Once | ORAL | Status: AC
  Administered 2017-05-04: 10 mg via ORAL
  Filled 2017-05-04: qty 1

## 2017-05-04 MED ORDER — OXYCODONE-ACETAMINOPHEN 5-325 MG PO TABS
2.0000 | ORAL_TABLET | Freq: Once | ORAL | Status: AC
  Administered 2017-05-04: 2 via ORAL
  Filled 2017-05-04: qty 2

## 2017-05-04 MED ORDER — TORSEMIDE 20 MG PO TABS
40.0000 mg | ORAL_TABLET | Freq: Every day | ORAL | Status: DC
  Administered 2017-05-04 – 2017-05-08 (×5): 40 mg via ORAL
  Filled 2017-05-04 (×5): qty 2

## 2017-05-04 MED ORDER — ENOXAPARIN SODIUM 150 MG/ML ~~LOC~~ SOLN
165.0000 mg | Freq: Two times a day (BID) | SUBCUTANEOUS | Status: DC
  Administered 2017-05-05 – 2017-05-09 (×10): 165 mg via SUBCUTANEOUS
  Filled 2017-05-04: qty 2
  Filled 2017-05-04 (×7): qty 1.1
  Filled 2017-05-04 (×2): qty 2
  Filled 2017-05-04: qty 1.1
  Filled 2017-05-04: qty 2
  Filled 2017-05-04 (×3): qty 1.1
  Filled 2017-05-04: qty 2

## 2017-05-04 MED ORDER — TRAMADOL HCL 50 MG PO TABS
50.0000 mg | ORAL_TABLET | Freq: Four times a day (QID) | ORAL | Status: DC | PRN
  Administered 2017-05-04 – 2017-05-07 (×2): 50 mg via ORAL
  Filled 2017-05-04 (×2): qty 1

## 2017-05-04 MED ORDER — ONDANSETRON HCL 4 MG PO TABS: 4.0000 mg | ORAL_TABLET | Freq: Four times a day (QID) | ORAL | Status: DC | PRN

## 2017-05-04 MED ORDER — ACETAMINOPHEN 500 MG PO TABS: 1000.0000 mg | ORAL_TABLET | Freq: Four times a day (QID) | ORAL | Status: DC | PRN

## 2017-05-04 MED ORDER — AMLODIPINE BESYLATE 5 MG PO TABS
5.0000 mg | ORAL_TABLET | Freq: Every day | ORAL | Status: DC
  Administered 2017-05-04 – 2017-05-09 (×6): 5 mg via ORAL
  Filled 2017-05-04 (×6): qty 1

## 2017-05-04 MED ORDER — FERROUS SULFATE 325 (65 FE) MG PO TABS
325.0000 mg | ORAL_TABLET | Freq: Every day | ORAL | Status: DC
  Administered 2017-05-04 – 2017-05-09 (×6): 325 mg via ORAL
  Filled 2017-05-04 (×6): qty 1

## 2017-05-04 MED ORDER — WARFARIN - PHARMACIST DOSING INPATIENT
Freq: Every day | Status: DC
  Administered 2017-05-05 – 2017-05-08 (×2)

## 2017-05-04 MED ORDER — OXYCODONE-ACETAMINOPHEN 5-325 MG PO TABS
1.0000 | ORAL_TABLET | ORAL | Status: AC
  Administered 2017-05-04: 1 via ORAL
  Filled 2017-05-04: qty 1

## 2017-05-04 MED ORDER — PANTOPRAZOLE SODIUM 40 MG PO TBEC
40.0000 mg | DELAYED_RELEASE_TABLET | Freq: Every day | ORAL | Status: DC
  Administered 2017-05-04 – 2017-05-09 (×6): 40 mg via ORAL
  Filled 2017-05-04 (×6): qty 1

## 2017-05-04 MED ORDER — ACETAMINOPHEN 500 MG PO TABS
500.0000 mg | ORAL_TABLET | Freq: Four times a day (QID) | ORAL | Status: DC | PRN
  Administered 2017-05-07: 500 mg via ORAL
  Filled 2017-05-04: qty 1

## 2017-05-04 MED ORDER — POLYETHYLENE GLYCOL 3350 17 G PO PACK: 17.0000 g | PACK | Freq: Every day | ORAL | Status: DC | PRN

## 2017-05-04 MED ORDER — ONDANSETRON HCL 4 MG/2ML IJ SOLN: 4.0000 mg | Freq: Four times a day (QID) | INTRAMUSCULAR | Status: DC | PRN

## 2017-05-04 MED ORDER — WARFARIN VIDEO: Freq: Once | Status: DC

## 2017-05-04 MED ORDER — LISINOPRIL 10 MG PO TABS
10.0000 mg | ORAL_TABLET | Freq: Every day | ORAL | Status: DC
  Administered 2017-05-04 – 2017-05-09 (×6): 10 mg via ORAL
  Filled 2017-05-04 (×6): qty 1

## 2017-05-04 MED ORDER — OXYCODONE-ACETAMINOPHEN 5-325 MG PO TABS
1.0000 | ORAL_TABLET | Freq: Four times a day (QID) | ORAL | Status: DC | PRN
  Administered 2017-05-04 – 2017-05-08 (×9): 1 via ORAL
  Filled 2017-05-04 (×9): qty 1

## 2017-05-04 MED ORDER — COUMADIN BOOK
Freq: Once | Status: DC
  Filled 2017-05-04: qty 1

## 2017-05-04 NOTE — Progress Notes (Signed)
ANTICOAGULATION CONSULT NOTE - Initial Consult  Pharmacy Consult for Lovenox Indication: DVT  No Known Allergies  Patient Measurements: Height: 6' (182.9 cm) Weight: (!) 360 lb (163.3 kg) IBW/kg (Calculated) : 77.6  Vital Signs: Temp: 98.4 F (36.9 C) (08/10 1949) Temp Source: Oral (08/10 1949) BP: 134/64 (08/10 2302) Pulse Rate: 74 (08/10 2302)  Labs:  Recent Labs  05/03/17 1730  05/03/17 2145 05/04/17 0524 05/04/17 0656 05/04/17 1256 05/04/17 2153  HGB 15.5  --   --  14.3  --   --   --   HCT 48.1  --   --  46.8  --   --   --   PLT 351  --   --  298  --   --   --   APTT  --   < > 44* 50*  --  70* 58*  LABPROT  --   --  20.8*  --   --   --   --   INR  --   --  1.76  --   --   --   --   HEPARINUNFRC  --   --  >2.20*  --   --   --   --   CREATININE 1.33*  --   --   --   --   --   --   CKTOTAL  --   --   --   --  35*  --   --   < > = values in this interval not displayed.  Estimated Creatinine Clearance: 98.2 mL/min (A) (by C-G formula based on SCr of 1.33 mg/dL (H)).   Medical History: Past Medical History:  Diagnosis Date  . Asthma   . CHF (congestive heart failure) ([place])   . COPD (chronic obstructive pulmonary disease) ([place])   . Diabetes mellitus without complication ([place])   . Hypertension     Medications:  Prescriptions Prior to Admission  Medication Sig Dispense Refill Last Dose  . acetaminophen (TYLENOL) 500 MG tablet Take 1,000 mg by mouth every 6 (six) hours as needed for moderate pain.   05/03/2017 at Unknown time  . amLODipine (NORVASC) 5 MG tablet Take 5 mg by mouth daily.  1 05/03/2017 at Unknown time  . cholecalciferol (VITAMIN D) 1000 units tablet Take 1,000 Units by mouth daily.  1 05/03/2017 at Unknown time  . ferrous sulfate 325 (65 FE) MG tablet Take 325 mg by mouth daily.   05/03/2017 at Unknown time  . glyBURIDE (DIABETA) 5 MG tablet Take 5 mg by mouth at bedtime as needed (high blood sugar).   0 Past Month at Unknown time  . lisinopril  (PRINIVIL,ZESTRIL) 10 MG tablet Take 1 tablet (10 mg total) by mouth daily. 30 tablet 11 05/03/2017 at Unknown time  . metFORMIN (GLUCOPHAGE) 1000 MG tablet Take 1,000 mg by mouth 2 (two) times daily.  1 05/03/2017 at Unknown time  . Multiple Vitamin (MULTIVITAMIN WITH MINERALS) TABS tablet Take 1 tablet by mouth daily.   05/03/2017 at Unknown time  . OXYGEN Inhale 5 L into the lungs continuous.   05/03/2017 at ongoing  . Rivaroxaban (XARELTO) 15 MG TABS tablet Take 1 tablet (15 mg total) by mouth 2 (two) times daily with a meal. 36 tablet 0 05/03/2017 at 1100  . torsemide (DEMADEX) 20 MG tablet Take 40 mg by mouth at bedtime.  5 05/02/2017 at Unknown time  . naproxen sodium (ALEVE) 220 MG tablet Take 220 mg by mouth every 12 (  twelve) hours as needed (pain).   unknown  . PRESCRIPTION MEDICATION Inhale into the lungs as needed (whenever sleeping - naps or at night). CPAP   unknown   Scheduled:  . amLODipine  5 mg Oral Daily  . coumadin book   Does not apply Once  . [START ON 05/05/2017] enoxaparin (LOVENOX) injection  165 mg Subcutaneous Q12H  . ferrous sulfate  325 mg Oral Daily  . insulin aspart  0-9 Units Subcutaneous TID WC  . lisinopril  10 mg Oral Daily  . pantoprazole  40 mg Oral Daily  . torsemide  40 mg Oral QHS  . warfarin   Does not apply Once  . Warfarin - Pharmacist Dosing Inpatient   Does not apply q1800    Assessment: 56yo male to transition from UFH to LMWH for DVT sustained while on treatment-dose Xarelto, awaiting therapeutic INR on transition to Coumadin.  Goal of Therapy:  Anti-Xa level 0.6-1 units/ml 4hrs after LMWH dose given Monitor platelets by anticoagulation protocol: Yes   Plan:  Asked RN to d/c heparin gtt at 2300; will start Lovenox 123m SQ Q12H beginning 1h after heparin stoppeed; monitor CBC and anti-Xa levels when appropriate.  VWynona Neat PharmD, BCPS  05/04/2017,11:40 PM

## 2017-05-04 NOTE — Progress Notes (Signed)
CCMD advised pt had a 3.09 pause and HR dropped to the 20's. Pt was asymptomatic. MD notified.

## 2017-05-04 NOTE — Progress Notes (Signed)
ANTICOAGULATION CONSULT NOTE - Initial Consult  Pharmacy Consult for Coumadin Indication: DVT  No Known Allergies  Patient Measurements: Height: 6' (182.9 cm) Weight: (!) 360 lb (163.3 kg) IBW/kg (Calculated) : 77.6  Vital Signs: Temp: 98.4 F (36.9 C) (08/10 0427) Temp Source: Oral (08/10 0427) BP: 115/73 (08/10 0427) Pulse Rate: 67 (08/10 0427)  Labs:  Recent Labs  05/03/17 1730 05/03/17 2145  HGB 15.5  --   HCT 48.1  --   PLT 351  --   APTT  --  44*  LABPROT  --  20.8*  INR  --  1.76  HEPARINUNFRC  --  >2.20*  CREATININE 1.33*  --     Estimated Creatinine Clearance: 98.2 mL/min (A) (by C-G formula based on SCr of 1.33 mg/dL (H)).   Medical History: Past Medical History:  Diagnosis Date  . Asthma   . CHF (congestive heart failure) ([place])   . COPD (chronic obstructive pulmonary disease) ([place])   . Diabetes mellitus without complication ([place])   . Hypertension     Medications:  Prescriptions Prior to Admission  Medication Sig Dispense Refill Last Dose  . acetaminophen (TYLENOL) 500 MG tablet Take 1,000 mg by mouth every 6 (six) hours as needed for moderate pain.   05/03/2017 at Unknown time  . amLODipine (NORVASC) 5 MG tablet Take 5 mg by mouth daily.  1 05/03/2017 at Unknown time  . cholecalciferol (VITAMIN D) 1000 units tablet Take 1,000 Units by mouth daily.  1 05/03/2017 at Unknown time  . ferrous sulfate 325 (65 FE) MG tablet Take 325 mg by mouth daily.   05/03/2017 at Unknown time  . glyBURIDE (DIABETA) 5 MG tablet Take 5 mg by mouth at bedtime as needed (high blood sugar).   0 Past Month at Unknown time  . lisinopril (PRINIVIL,ZESTRIL) 10 MG tablet Take 1 tablet (10 mg total) by mouth daily. 30 tablet 11 05/03/2017 at Unknown time  . metFORMIN (GLUCOPHAGE) 1000 MG tablet Take 1,000 mg by mouth 2 (two) times daily.  1 05/03/2017 at Unknown time  . Multiple Vitamin (MULTIVITAMIN WITH MINERALS) TABS tablet Take 1 tablet by mouth daily.   05/03/2017 at Unknown time  .  OXYGEN Inhale 5 L into the lungs continuous.   05/03/2017 at ongoing  . Rivaroxaban (XARELTO) 15 MG TABS tablet Take 1 tablet (15 mg total) by mouth 2 (two) times daily with a meal. 36 tablet 0 05/03/2017 at 1100  . torsemide (DEMADEX) 20 MG tablet Take 40 mg by mouth at bedtime.  5 05/02/2017 at Unknown time  . naproxen sodium (ALEVE) 220 MG tablet Take 220 mg by mouth every 12 (twelve) hours as needed (pain).   unknown  . PRESCRIPTION MEDICATION Inhale into the lungs as needed (whenever sleeping - naps or at night). CPAP   unknown   Scheduled:  . amLODipine  5 mg Oral Daily  . ferrous sulfate  325 mg Oral Daily  . insulin aspart  0-9 Units Subcutaneous TID WC  . lisinopril  10 mg Oral Daily  . torsemide  40 mg Oral QHS   Infusions:  . heparin 1,800 Units/hr (05/03/17 2301)    Assessment: 56yo male w/ h/o DVT was started on Xarelto 32m BID for new DVT w/ transition to 237mdaily to have been 8/12 but now presents w/ new thrombosis despite Xarelto at treatment dose, started on heparin and now to transition to Coumadin.  Of note, INR is currently 1.76 but is likely 2/2 Xarelto.  Goal  of Therapy:  INR 2-3 (after Xarelto effect clears)   Plan:  Will give Coumadin 54m po x1 today and monitor INR for dose adjustments; start Coumadin education.  VWynona Neat PharmD, BCPS  05/04/2017,5:23 AM

## 2017-05-04 NOTE — Progress Notes (Signed)
Family Medicine Teaching Service Daily Progress Note Intern Pager: [phone]  Patient name: Roberto [name] Medical record number: [id number] Date of birth: 1961-06-14 Age: 56 y.o. Gender: male  Primary Care Provider: Dhivianathan, Candida Peeling, MD Consultants:  Code Status: Full  Pt Overview and Major Events to Date:  Roberto [name] is a 56 y.o. male presenting with 5 days left leg pain shown by [place] to be saphenous vein clot. PMH is significant for recurrent DVT, Afib, CHF (EF 60-65% with severe concentric hypertrophy), OSA on CPAP, HTN.   Overnight on 8/9 he had bradycardia intermittently into the 20s-30s when trying to sleep without his CPAP machine.  Assessment and Plan: Roberto [name] is a 56 y.o. male presenting with 5 days left leg pain. PMH is significant for recurrent DVT, Afib, CHF (EF 60-65% with severe concentric hypertrophy), OSA on CPAP, HTN.  L leg pain with thrombosis of L greater saphenous vein: Darkening of LLE and asymmetric swelling compared to R. Had acute DVT of left femoral vein and left popliteal vein during last hospitalization and repeat LE doppler today showed no propagation but superficial thrombosis of L greater saphenous vein from ankle to mid thigh. Palpable pulses so not suspecting compartment syndrome. Mild leukocytosis and having fevers/chills but more likely attributable to clot burden. ED physician spoke with  Vascular Surgeon Dr. Bridgett Larsson who reported that due to short half life of xarelto they occasionally see clots in those who are not strict on their medication timing. Not considering clot-busting procedures due to risk. Started on heparin in [place] with plan to transition to warfarin, as he is not a candidate for lovenox bridging due to mild CKD. INR 1.76 at [facility].  -admit to telemetry, Attending Dr. McDiarmid -vascular consulted, recommended transition to coumadin -coumadin per pharmacy -heparin per pharmacy -routine pulse/sensation checks  -tramadol 50 mg q6h prn and  tylenol 1000 mg q6h prn for pain control -PT/OT eval -Follow INR  Chronic Dyspnea: Improved since last hospitalization per patient. Weight down since discharge (260 lbs vs 271 lbs).  - Continue hometorsemide 40 mg QHS - Strict I/O, monitor daily weights - Continue cardiac monitor - Continue home oxygen (5L)   CHF:  Home torsemide. On home 5L for chronic respiratory failure and maintaining appropriate O2 sats. Echo performed 7/22 which revealed 60-65% EF with pulm hypertension. - Continue hometorsemide - Strict I/O, monitor daily weights - Continue home lisinopril at 10 mg (decreased during last admission) - Continue cardiac monitor - Continue home oxygen (5L)  Atrial fibrillation w/o RVR: chad-vasc score 6. Not on rate or rhythm control. Previously on Xarelto.Denies history of GI bleed. HR mid 60s during intake examination - stop home Xarelto - coumadin per pharmacy - cardiac monitor - Monitor vitals  AKI/?CKD: Unsure of his baseline (~1.2?). Cr slightly increased to 1.33 -Daily BMP -avoid NSAIDs -monitor weights and would not try to further diurese as below last discharge weight -Check CK as has been less mobile due to pain  T2DM: CBG 111 in [facility] PM. Last A1c 7.3 on 04/14/17. Controlled on metformin and glyburide at home.  - sensitive SSI insulin and qachs CBGs ordered  Asthma/COPD: No prior formal diagnosis. Never smoker. No wheezing on exam.  On home 5L [place].  -PRN dounebs -would refer to Pulmonology outpatient, as also had pulmonary nodule noted during last hospitalization that will require monitoring and patient suspects CPAP machine will need replacing soon  Hypertension: 109-158/62-93 -Continue home lisinopril and amlodipine  OSA/?OHS -Nightly CPAP  Lung nodule:7 mm solid-appearing  nodule noted in the right lung on CTA. Follow up CT at 18-24 months recommended.   FEN/GI:heart healthy/carb modified diet  Prophylaxis: on full  anticoagulation  Disposition: pending therapeutic INR and PT/OT evals  Subjective:  Pain in his leg is still not managed and he says he needs more meds.   He claims the bradycardic episodes happen when he doesn't have his cpap.   No pain anywhere but his leg.    Objective: Temp:  [98.4 F (36.9 C)-100.1 F (37.8 C)] 98.4 F (36.9 C) (08/10 0427) Pulse Rate:  [62-81] 75 (08/10 0600) Resp:  [14-26] 24 (08/10 0600) BP: (107-158)/(62-103) 158/93 (08/10 0600) SpO2:  [83 %-97 %] 83 % (08/10 0600) Weight:  [360 lb (163.3 kg)-371 lb 11.1 oz (168.6 kg)] 360 lb (163.3 kg) (08/10 0427) Physical Exam: General: alert and conversational, appears tired and uncomfortable Eyes: PERRL, EOMI ENTM: no nasal discharge, conjunctivitis, MMM Cardiovascular: irregular heart rate (afib showing on monitor), but ventricular rate was ~60-70, no murmur appreciated though difficult exam due to habitus; palpable DP pulse of LLE and faint PT pulse.  Respiratory: satting mid 90s on 5L (home dose) O2 nasal canula, no increased WOB, CTAB Gastrointestinal: soft belly with no tendernesss to deep palpation, bowel sounds in 4 quadrants        MSK: significant asymmetrical swelling/discoloration in the left lower leg. No pitting edema of LLE. Chronic venous stasis changes. Derm: skin changes including color to deep purple/black/red, texture to thick on left lower leg.  Patient complained of pain in knee while plantarflexing L foot and extending knee. Neuro: slightly decreased sensation on L lower leg, negative Babinski  Psych: appropriate and pleasant  Laboratory:  Recent Labs Lab 05/03/17 1730 05/04/17 0524  WBC 12.7* 9.2  HGB 15.5 14.3  HCT 48.1 46.8  PLT 351 298    Recent Labs Lab 05/03/17 1730  NA 141  K 4.1  CL 101  CO2 30  BUN 25*  CREATININE 1.33*  CALCIUM 9.4  PROT 7.7  BILITOT 1.4*  ALKPHOS 80  ALT 16*  AST 27  GLUCOSE 111*      Imaging/Diagnostic Tests: Dg Chest 2 View  Result Date:  05/03/2017 CLINICAL DATA:  Low-grade fever.  Recent pneumonia. EXAM: CHEST  2 VIEW COMPARISON:  04/13/2017 FINDINGS: Moderate cardiomegaly and chronic pulmonary vascular congestion are stable in appearance. No evidence of acute infiltrate or pleural effusion. IMPRESSION: Stable cardiomegaly and chronic pulmonary vascular congestion. No acute findings . Electronically Signed   By: Earle Gell M.D.   On: 05/03/2017 18:45     Sherene Sires, DO 05/04/2017, 7:29 AM PGY-1, [place] Intern pager: [phone], text pages welcome

## 2017-05-04 NOTE — Progress Notes (Signed)
Placed patient on CPAP for the night with pressure set at 7cm

## 2017-05-04 NOTE — Progress Notes (Signed)
ANTICOAGULATION CONSULT NOTE - Follow Up Consult  Pharmacy Consult for heparin Indication: DVT  Labs:  Recent Labs  05/03/17 1730 05/03/17 2145 05/04/17 0524  HGB 15.5  --  14.3  HCT 48.1  --  46.8  PLT 351  --  298  APTT  --  44* 50*  LABPROT  --  20.8*  --   INR  --  1.76  --   HEPARINUNFRC  --  >2.20*  --   CREATININE 1.33*  --   --     Assessment: 56yo male subtherapeutic on heparin with initial dosing for new DVT while on Xarelto.  Goal of Therapy:  aPTT 66-102 seconds   Plan:  Will increase heparin gtt by ~3 units/kgABW/hr to 2200 units/hr and check PTT in 6hr.  Wynona Neat, PharmD, BCPS  05/04/2017,6:51 AM

## 2017-05-04 NOTE — Progress Notes (Signed)
ANTICOAGULATION CONSULT NOTE - Follow Up Consult  Pharmacy Consult for Heparin and Coumadin Indication: DVT  No Known Allergies  Patient Measurements: Height: 6' (182.9 cm) Weight: (!) 360 lb (163.3 kg) IBW/kg (Calculated) : 77.6   Vital Signs: Temp: 97.8 F (36.6 C) (08/10 1224) Temp Source: Oral (08/10 1224) BP: 136/73 (08/10 1224) Pulse Rate: 64 (08/10 1224)  Labs:  Recent Labs  05/03/17 1730 05/03/17 2145 05/04/17 0524 05/04/17 0656 05/04/17 1256  HGB 15.5  --  14.3  --   --   HCT 48.1  --  46.8  --   --   PLT 351  --  298  --   --   APTT  --  44* 50*  --  70*  LABPROT  --  20.8*  --   --   --   INR  --  1.76  --   --   --   HEPARINUNFRC  --  >2.20*  --   --   --   CREATININE 1.33*  --   --   --   --   CKTOTAL  --   --   --  35*  --     Estimated Creatinine Clearance: 98.2 mL/min (A) (by C-G formula based on SCr of 1.33 mg/dL (H)).   Medications:  Scheduled:  . amLODipine  5 mg Oral Daily  . coumadin book   Does not apply Once  . ferrous sulfate  325 mg Oral Daily  . insulin aspart  0-9 Units Subcutaneous TID WC  . lisinopril  10 mg Oral Daily  . pantoprazole  40 mg Oral Daily  . torsemide  40 mg Oral QHS  . warfarin  10 mg Oral ONCE-1800  . warfarin   Does not apply Once  . Warfarin - Pharmacist Dosing Inpatient   Does not apply q1800    Assessment: 56yo male with new DVT while on Xarelto for AFib, now on Heparin bridge to Coumadin.  Using PTT to monitor heparin level until Xarelto effect wears off; PTT is within goal but significantly increased with previous rate change so will back off rate.  Hg and pltc wnl.  No bleeding noted.  Goal of Therapy:  aPTT 66-102 seconds Monitor platelets by anticoagulation protocol: Yes   Plan:  Decrease heparin to 2150 units/hr Coumadin 80m po today (already ordered) Daily Heparin level, CBC, aPTT Watch for s/s of bleeding  KGracy Bruins B.S., PharmD [facility]

## 2017-05-04 NOTE — Progress Notes (Signed)
PT Cancellation Note  Patient Details Name: Roberto [name] MRN: [id number] DOB: [birth year]   Cancelled Treatment:    Reason Eval/Treat Not Completed: Medical issues which prohibited therapy Pt with new DVT. Will await til pt is therapeutic prior to PT evaluation per protocol. Will follow.   Marguarite Arbour A Oumar Marcott 05/04/2017, 9:06 AM  Wray Kearns, PT, DPT [phone]

## 2017-05-05 ENCOUNTER — Encounter (HOSPITAL_COMMUNITY): Payer: Self-pay | Admitting: *Deleted

## 2017-05-05 LAB — GLUCOSE, CAPILLARY
GLUCOSE-CAPILLARY: 109 mg/dL — AB (ref 65–99)
GLUCOSE-CAPILLARY: 131 mg/dL — AB (ref 65–99)
GLUCOSE-CAPILLARY: 125 mg/dL — AB (ref 65–99)
Glucose-Capillary: 144 mg/dL — ABNORMAL HIGH (ref 65–99)

## 2017-05-05 LAB — CBC
HCT: 44.2 % (ref 39.0–52.0)
Hemoglobin: 14.2 g/dL (ref 13.0–17.0)
MCH: 28.4 pg (ref 26.0–34.0)
MCHC: 32.1 g/dL (ref 30.0–36.0)
MCV: 88.4 fL (ref 78.0–100.0)
PLATELETS: 295 10*3/uL (ref 150–400)
RBC: 5 MIL/uL (ref 4.22–5.81)
RDW: 14.7 % (ref 11.5–15.5)
WBC: 9.9 10*3/uL (ref 4.0–10.5)

## 2017-05-05 LAB — PROTIME-INR
INR: 1.19
Prothrombin Time: 15.2 seconds (ref 11.4–15.2)

## 2017-05-05 LAB — BASIC METABOLIC PANEL
ANION GAP: 9 (ref 5–15)
BUN: 21 mg/dL — ABNORMAL HIGH (ref 6–20)
CALCIUM: 9.3 mg/dL (ref 8.9–10.3)
CO2: 30 mmol/L (ref 22–32)
Chloride: 99 mmol/L — ABNORMAL LOW (ref 101–111)
Creatinine, Ser: 1.17 mg/dL (ref 0.61–1.24)
Glucose, Bld: 126 mg/dL — ABNORMAL HIGH (ref 65–99)
Potassium: 4.2 mmol/L (ref 3.5–5.1)
Sodium: 138 mmol/L (ref 135–145)

## 2017-05-05 MED ORDER — IPRATROPIUM-ALBUTEROL 0.5-2.5 (3) MG/3ML IN SOLN
3.0000 mL | Freq: Two times a day (BID) | RESPIRATORY_TRACT | Status: DC
  Administered 2017-05-05 – 2017-05-07 (×4): 3 mL via RESPIRATORY_TRACT
  Filled 2017-05-05 (×4): qty 3

## 2017-05-05 MED ORDER — ALBUTEROL SULFATE (2.5 MG/3ML) 0.083% IN NEBU
2.5000 mg | INHALATION_SOLUTION | RESPIRATORY_TRACT | Status: DC | PRN
  Administered 2017-05-05 – 2017-05-08 (×2): 2.5 mg via RESPIRATORY_TRACT
  Filled 2017-05-05: qty 3

## 2017-05-05 MED ORDER — IPRATROPIUM-ALBUTEROL 0.5-2.5 (3) MG/3ML IN SOLN
3.0000 mL | Freq: Four times a day (QID) | RESPIRATORY_TRACT | Status: DC
  Filled 2017-05-05: qty 3

## 2017-05-05 MED ORDER — WARFARIN SODIUM 10 MG PO TABS
10.0000 mg | ORAL_TABLET | Freq: Once | ORAL | Status: AC
  Administered 2017-05-05: 10 mg via ORAL
  Filled 2017-05-05: qty 1

## 2017-05-05 MED ORDER — FLUTICASONE PROPIONATE 50 MCG/ACT NA SUSP
1.0000 | Freq: Every day | NASAL | Status: DC
  Administered 2017-05-05 – 2017-05-09 (×4): 1 via NASAL
  Filled 2017-05-05: qty 16

## 2017-05-05 NOTE — Discharge Instructions (Signed)
. Information on my medicine - Coumadin   (Warfarin)  This medication education was reviewed with me or my healthcare representative as part of my discharge preparation.  The pharmacist that spoke with me during my hospital stay was:  Leroy Libman, [facility]  Why was Coumadin prescribed for you? Coumadin was prescribed for you because you have a blood clot or a medical condition that can cause an increased risk of forming blood clots. Blood clots can cause serious health problems by blocking the flow of blood to the heart, lung, or brain. Coumadin can prevent harmful blood clots from forming. As a reminder your indication for Coumadin is:   Deep Vein Thrombosis Treatment  What test will check on my response to Coumadin? While on Coumadin (warfarin) you will need to have an INR test regularly to ensure that your dose is keeping you in the desired range. The INR (international normalized ratio) number is calculated from the result of the laboratory test called prothrombin time (PT).  If an INR APPOINTMENT HAS NOT ALREADY BEEN MADE FOR YOU please schedule an appointment to have this lab work done by your health care provider within 7 days. Your INR goal is usually a number between:  2 to 3 or your provider may give you a more narrow range like 2-2.5.  Ask your health care provider during an office visit what your goal INR is.  What  do you need to  know  About  COUMADIN? Take Coumadin (warfarin) exactly as prescribed by your healthcare provider about the same time each day.  DO NOT stop taking without talking to the doctor who prescribed the medication.  Stopping without other blood clot prevention medication to take the place of Coumadin may increase your risk of developing a new clot or stroke.  Get refills before you run out.  What do you do if you miss a dose? If you miss a dose, take it as soon as you remember on the same day then continue your regularly scheduled regimen the next day.  Do not take  two doses of Coumadin at the same time.  Important Safety Information A possible side effect of Coumadin (Warfarin) is an increased risk of bleeding. You should call your healthcare provider right away if you experience any of the following: ? Bleeding from an injury or your nose that does not stop. ? Unusual colored urine (red or dark brown) or unusual colored stools (red or black). ? Unusual bruising for unknown reasons. ? A serious fall or if you hit your head (even if there is no bleeding).  Some foods or medicines interact with Coumadin (warfarin) and might alter your response to warfarin. To help avoid this: ? Eat a balanced diet, maintaining a consistent amount of Vitamin K. ? Notify your provider about major diet changes you plan to make. ? Avoid alcohol or limit your intake to 1 drink for women and 2 drinks for men per day. (1 drink is 5 oz. wine, 12 oz. beer, or 1.5 oz. liquor.)  Make sure that ANY health care provider who prescribes medication for you knows that you are taking Coumadin (warfarin).  Also make sure the healthcare provider who is monitoring your Coumadin knows when you have started a new medication including herbals and non-prescription products.  Coumadin (Warfarin)  Major Drug Interactions  Increased Warfarin Effect Decreased Warfarin Effect  Alcohol (large quantities) Antibiotics (esp. Septra/Bactrim, Flagyl, Cipro) Amiodarone (Cordarone) Aspirin (ASA) Cimetidine (Tagamet) Megestrol (Megace) NSAIDs (ibuprofen, naproxen, etc.)  Piroxicam (Feldene) Propafenone (Rythmol SR) Propranolol (Inderal) Isoniazid (INH) Posaconazole (Noxafil) Barbiturates (Phenobarbital) Carbamazepine (Tegretol) Chlordiazepoxide (Librium) Cholestyramine (Questran) Griseofulvin Oral Contraceptives Rifampin Sucralfate (Carafate) Vitamin K   Coumadin (Warfarin) Major Herbal Interactions  Increased Warfarin Effect Decreased Warfarin Effect  Garlic Ginseng Ginkgo biloba  Coenzyme Q10 Green tea St. Johns wort    Coumadin (Warfarin) FOOD Interactions  Eat a consistent number of servings per week of foods HIGH in Vitamin K (1 serving =  cup)  Collards (cooked, or boiled & drained) Kale (cooked, or boiled & drained) Mustard greens (cooked, or boiled & drained) Parsley *serving size only =  cup Spinach (cooked, or boiled & drained) Swiss chard (cooked, or boiled & drained) Turnip greens (cooked, or boiled & drained)  Eat a consistent number of servings per week of foods MEDIUM-HIGH in Vitamin K (1 serving = 1 cup)  Asparagus (cooked, or boiled & drained) Broccoli (cooked, boiled & drained, or raw & chopped) Brussel sprouts (cooked, or boiled & drained) *serving size only =  cup Lettuce, raw (green leaf, endive, romaine) Spinach, raw Turnip greens, raw & chopped   These websites have more information on Coumadin (warfarin):  FailFactory.se; VeganReport.com.au;

## 2017-05-05 NOTE — Progress Notes (Signed)
[place] for Lovenox/Warfarin Indication: DVT/Afib  No Known Allergies  Patient Measurements: Height: 6' (182.9 cm) Weight: (!) 358 lb (162.4 kg) IBW/kg (Calculated) : 77.6  Vital Signs: Temp: 98.6 F (37 C) (08/11 0700) Temp Source: Oral (08/11 0700) BP: 103/59 (08/11 0627) Pulse Rate: 73 (08/11 0627)  Labs:  Recent Labs  05/03/17 1730  05/03/17 2145 05/04/17 0524 05/04/17 0656 05/04/17 1256 05/04/17 2153 05/05/17 0533  HGB 15.5  --   --  14.3  --   --   --  14.2  HCT 48.1  --   --  46.8  --   --   --  44.2  PLT 351  --   --  298  --   --   --  295  APTT  --   < > 44* 50*  --  70* 58*  --   LABPROT  --   --  20.8*  --   --   --   --  15.2  INR  --   --  1.76  --   --   --   --  1.19  HEPARINUNFRC  --   --  >2.20*  --   --   --   --   --   CREATININE 1.33*  --   --   --   --   --   --  1.17  CKTOTAL  --   --   --   --  35*  --   --   --   < > = values in this interval not displayed.  Estimated Creatinine Clearance: 111.2 mL/min (by C-G formula based on SCr of 1.17 mg/dL).   Medical History: Past Medical History:  Diagnosis Date  . Asthma   . CHF (congestive heart failure) ([place])   . COPD (chronic obstructive pulmonary disease) ([place])   . Diabetes mellitus without complication ([place])   . Hypertension     Medications:  Prescriptions Prior to Admission  Medication Sig Dispense Refill Last Dose  . acetaminophen (TYLENOL) 500 MG tablet Take 1,000 mg by mouth every 6 (six) hours as needed for moderate pain.   05/03/2017 at Unknown time  . amLODipine (NORVASC) 5 MG tablet Take 5 mg by mouth daily.  1 05/03/2017 at Unknown time  . cholecalciferol (VITAMIN D) 1000 units tablet Take 1,000 Units by mouth daily.  1 05/03/2017 at Unknown time  . ferrous sulfate 325 (65 FE) MG tablet Take 325 mg by mouth daily.   05/03/2017 at Unknown time  . glyBURIDE (DIABETA) 5 MG tablet Take 5 mg by mouth at bedtime as needed (high blood sugar).   0 Past  Month at Unknown time  . lisinopril (PRINIVIL,ZESTRIL) 10 MG tablet Take 1 tablet (10 mg total) by mouth daily. 30 tablet 11 05/03/2017 at Unknown time  . metFORMIN (GLUCOPHAGE) 1000 MG tablet Take 1,000 mg by mouth 2 (two) times daily.  1 05/03/2017 at Unknown time  . Multiple Vitamin (MULTIVITAMIN WITH MINERALS) TABS tablet Take 1 tablet by mouth daily.   05/03/2017 at Unknown time  . OXYGEN Inhale 5 L into the lungs continuous.   05/03/2017 at ongoing  . Rivaroxaban (XARELTO) 15 MG TABS tablet Take 1 tablet (15 mg total) by mouth 2 (two) times daily with a meal. 36 tablet 0 05/03/2017 at 1100  . torsemide (DEMADEX) 20 MG tablet Take 40 mg by mouth at bedtime.  5 05/02/2017 at Unknown time  . naproxen  sodium (ALEVE) 220 MG tablet Take 220 mg by mouth every 12 (twelve) hours as needed (pain).   unknown  . PRESCRIPTION MEDICATION Inhale into the lungs as needed (whenever sleeping - naps or at night). CPAP   unknown   Scheduled:  . amLODipine  5 mg Oral Daily  . coumadin book   Does not apply Once  . enoxaparin (LOVENOX) injection  165 mg Subcutaneous Q12H  . ferrous sulfate  325 mg Oral Daily  . fluticasone  1 spray Each Nare Daily  . insulin aspart  0-9 Units Subcutaneous TID WC  . ipratropium-albuterol  3 mL Nebulization BID  . lisinopril  10 mg Oral Daily  . pantoprazole  40 mg Oral Daily  . torsemide  40 mg Oral QHS  . warfarin   Does not apply Once  . Warfarin - Pharmacist Dosing Inpatient   Does not apply q1800    Assessment: 56yo male with new DVT while on Xarelto for Afib, was on heparin bridge now switched to Lovenox bridge to Coumadin. INR 1.19 post warfarin 10 mg x 1. Hgb and plts wnl. No bleeding reported.   Goal of Therapy:  INR 2-3  Plan:  - Warfarin 10 mg po x 1  - Continue Lovenox 165 mg SQ q12 - Daily INR - CBC q72h  Leroy Libman, PharmD Pharmacy Resident Pager: [phone]

## 2017-05-05 NOTE — Progress Notes (Signed)
FPTS Interim Progress Note  S:Paged by nursing, said patient was intermittently bradycardic to 30s-40s  O: BP 112/66 (BP Location: Right Arm)   Pulse 68   Temp 98.2 F (36.8 C) (Oral)   Resp 17   Ht 6' (1.829 m)   Wt (!) 360 lb (163.3 kg)   SpO2 93%   BMI 48.82 kg/m     A/P: On entering room, patient was sleeping.   When I woke him up he was alert and conversational at baseline with entire visit.   pulseox waveform was irregular and was not a reliable reading in the 60s and patient was alert and conversational.   O2 sats in the 60s was not an accurate reading.   Ordering duoneb at patient's request after we had discussed the that I came in because the machine was saying he wasn't getting oxygen (he said he was breathing fine prior but then said he felt "tight").  No wheezes noted on auscultation  Bradycardia was likely due to irregular heart rate causing errant readings, patient was assymptomatic.  We are ordering an ecg to evaluate for heart block   Sherene Sires, DO 05/05/2017, 5:26 AM PGY-1, [facility] pager [phone]

## 2017-05-05 NOTE — Progress Notes (Signed)
Pt was desating in the 70's and 80's with BIPAP/5L02 but was asymptomatic. His HR dropped in the 30's with 2.63 secs pulse without any symptoms. MD on call has come to the pt's room twice this night and ordered neb tx, and EKG. Pt is alert and oriented X4. Skin warm and dry but C/O pain to LLE d/t DVT. Currently, he is feeling good as 02 sat is 94%/ and pain controlled with pain med and repositioning. We will continue to monitor

## 2017-05-05 NOTE — Evaluation (Signed)
Occupational Therapy Evaluation Patient Details Name: Roberto [name] MRN: [id number] DOB: September 03, 1961 Today's Date: 05/05/2017    History of Present Illness  Pattrick Bady is a 56 y.o. male presenting with 5 days left leg pain found to have superficial thrombosis of the left greater saphenous vein coursing from the ankle through the mid thigh.  Positive for DVT left LE confirmed last admission as well.  PMH is significant for asthma/copd, chf, copd, t2dm, osa, dvt in left leg, afib, htn, obesity   Clinical Impression   This 56 yo male admitted with above presents to acute OT with deficits below ( see OT problem list) thus affecting his PLOF of up to until recently admitted for DVT (7/20-7/25) of being totally independent. He will benefit from acute OT with follow up OT on CIR to get back to PLOF.    Follow Up Recommendations  CIR;Supervision/Assistance - 24 hour    Equipment Recommendations  Other (comment) (TBD at next venue of care)       Precautions / Restrictions Precautions Precautions: Fall Precaution Comments: monitor vitals as well as pt (they are not always conciding)--RN not sure what is going on with readings from leads Restrictions Weight Bearing Restrictions: No      Mobility Bed Mobility Overal bed mobility: Needs Assistance Bed Mobility: Supine to Sit;Sit to Supine     Supine to sit: Supervision;HOB elevated Sit to supine: Supervision;HOB elevated   General bed mobility comments: increased time and effort  Transfers                 General transfer comment: unable with +1 A and bed raised    Balance Overall balance assessment: Needs assistance Sitting-balance support: No upper extremity supported;Feet supported Sitting balance-Leahy Scale: Good         Standing balance comment: unable to stand with bed raised                           ADL either performed or assessed with clinical judgement   ADL Overall ADL's : Needs  assistance/impaired Eating/Feeding: Independent;Sitting   Grooming: Set up;Sitting   Upper Body Bathing: Set up;Sitting   Lower Body Bathing: Maximal assistance;Bed level   Upper Body Dressing : Set up;Sitting   Lower Body Dressing: Total assistance;Bed level     Toilet Transfer Details (indicate cue type and reason): unable at this time with +1 A and raised bed                 Vision Patient Visual Report: No change from baseline              Pertinent Vitals/Pain Pain Assessment: 0-10 Pain Score: 7  Pain Location: LLE with movement and WB'ing Pain Descriptors / Indicators: Grimacing;Guarding;Sore Pain Intervention(s): Limited activity within patient's tolerance;Monitored during session;Repositioned     Hand Dominance Right   Extremity/Trunk Assessment Upper Extremity Assessment Upper Extremity Assessment: Overall WFL for tasks assessed   Lower Extremity Assessment Lower Extremity Assessment: Defer to PT evaluation       ommunication Communication Communication: No difficulties   Cognition Arousal/Alertness: Awake/alert Behavior During Therapy: WFL for tasks assessed/performed Overall Cognitive Status: Within Functional Limits for tasks assessed  Home Living Family/patient expects to be discharged to:: Skilled nursing facility Living Arrangements: Children Available Help at Discharge: Family;Available PRN/intermittently Type of Home: House Home Access: Stairs to enter [employer] of Steps: 7--landing-7 Entrance Stairs-Rails: Left Home Layout: One level               Home Equipment: None          Prior Functioning/Environment Level of Independence: Independent                 OT Problem List: Decreased strength;Decreased range of motion;Impaired balance (sitting and/or standing);Obesity;Pain      OT Treatment/Interventions: Self-care/ADL  training;Patient/family education;DME and/or AE instruction;Balance training    OT Goals(Current goals can be found in the care plan section) Acute Rehab OT Goals Patient Stated Goal: rehab then home OT Goal Formulation: With patient Time For Goal Achievement: 05/19/17 Potential to Achieve Goals: Good  OT Frequency: Min 2X/week   Barriers to D/C: Decreased caregiver support             AM-PAC PT "6 Clicks" Daily Activity     Outcome Measure Help from another person eating meals?: None Help from another person taking care of personal grooming?: A Little Help from another person toileting, which includes using toliet, bedpan, or urinal?: Total Help from another person bathing (including washing, rinsing, drying)?: A Lot Help from another person to put on and taking off regular upper body clothing?: A Little Help from another person to put on and taking off regular lower body clothing?: Total 6 Click Score: 14   End of Session Equipment Utilized During Treatment: Rolling walker Nurse Communication: Mobility status (nurse tech as well. Pt also safe to sit EOB by himself, just need to let air out of matterss)  Activity Tolerance: Patient tolerated treatment well Patient left: in bed;with call bell/phone within reach  OT Visit Diagnosis: Other abnormalities of gait and mobility (R26.89);Muscle weakness (generalized) (M62.81);Pain Pain - Right/Left: Left Pain - part of body: Leg                Time: 5093-2671 OT Time Calculation (min): 44 min Charges:  OT General Charges $OT Visit: 1 Procedure OT Evaluation $OT Eval Moderate Complexity: 1 Procedure OT Treatments $Self Care/Home Management : 23-37 mins [place], OTR/L [phone] 05/05/2017

## 2017-05-05 NOTE — Discharge Summary (Signed)
[facility] Discharge Summary  Patient name: Roberto [name] Medical record number: [id number] Date of birth: [birth year] Age: 56 y.o. Gender: male Date of Admission: 05/03/2017  Date of Discharge: 05/09/17 Admitting Physician: Blane Ohara McDiarmid, MD  Primary Care Provider: Sherene Sires, DO Consultants: Vascular  Indication for Hospitalization: Saphenous Vein clot  Discharge Diagnoses/Problem List:  Saphenous vein clot (L), CHF, DMT2, Afib, Chronic respiratory failure with hypoxia.  Disposition: To [facility] as his morbid obesity, chronic respiratory failure and mobility issues make d/c to home not a safe condition until he is able to rehab and improve his condition  Discharge Condition: stable  Discharge Exam: General: uncomfortable but cheerful, alert and conversational, obese, on 5L O2 nasal canula at morning exam.  Looking forward to discharge to SNF Eyes: PERRL, EOMI ENTM: no nasal discharge, conjunctivitis, MMM Cardiovascular: irregular heart rate (afib showing on monitor), but ventricular rate was ~60-70,no murmur appreciated though difficult exam due to habitus; distal pulses of LE are symmetrical Respiratory: satting mid 90s on 5L (home dose) by end of exam,no increased WOB, CTAB Gastrointestinal: soft belly with no tendernesss to deep palpation, bowel sounds in 4 quadrants MSK: improved asymmetrical discoloration in the left lower leg. No pitting edema of LLE. Chronic venous stasis changes. Derm: improving skin changes including color to deep purple/black/red, texture to thick on left lower leg.  Neuro: slightly decreasedsensation on L lower leg Psych: appropriate and pleasant  Brief Hospital Course:  He presented to [facility] ED with complaints of leg pain and swelling that he says had kept him in bed for 3 days.   Venous ultrasound showed saphenous vein clot and vascular surgery suggested transitioning to warfarin instead of xarelto  so he was placed on heparin in the ED for transfer to [facility] for continuity of care.  After renal evaluation he was determined to be appropriate for lovenox bridge.   Details for ongoing management of warfarin bridge are below, please ensure timely adjustments/management of the warfarin bridge.   Overnight on 8/9-8/10 he had bradycardia intermittently into the 20s-30s when trying to sleep without his CPAP machine but that appears to be machine error due to afib and patient was consistently asymptomatic and ECG 8/11 was consistent with afib.  Issues for Follow Up:  1. Patient only prescribed 5 days of lovenox (122m BID via (2) 89msyringes) to get them through until their next INR check at the SNF.   It it is important that his anticoagulation bridge be managed and supervised. 2. Initial outpatient warfarin dose was 17.9m24mn 8/15 per pharmacy and he is being discharged on 19m16mily,  Only 10 days were prescribed in order to accomodate schedule delays, he will need to be prescribed again when his ongoing warfarin dose is set 3. Patient has nodule on CT requiring followup and expressed need for newer cpap, please refer to pulmonology  Significant Procedures: N/A  Significant Labs and Imaging:   Recent Labs Lab 05/06/17 0301 05/07/17 0211 05/08/17 0235  WBC 8.9 10.2 9.6  HGB 13.7 13.7 13.8  HCT 42.8 41.7 43.6  PLT 307 301 302    Recent Labs Lab 05/03/17 1730 05/05/17 0533 05/06/17 0301 05/07/17 0211 05/08/17 0235  NA 141 138 139 136 140  K 4.1 4.2 3.9 3.6 3.8  CL 101 99* 100* 100* 100*  CO2 _0 GLUCOSE 111* 126* 137* 145* 145*  BUN 25* 21* 24* 27* 27*  CREATININE 1.33* 1.17 1.27*  1.19 1.25*  CALCIUM 9.4 9.3 8.9 8.8* 9.1  ALKPHOS 80  --   --   --   --   AST 27  --   --   --   --   ALT 16*  --   --   --   --   ALBUMIN 3.9  --   --   --   --     Dg Chest 2 View  Result Date: 05/03/2017 CLINICAL DATA:  Low-grade fever.  Recent pneumonia. EXAM: CHEST  2 VIEW  COMPARISON:  04/13/2017 FINDINGS: Moderate cardiomegaly and chronic pulmonary vascular congestion are stable in appearance. No evidence of acute infiltrate or pleural effusion. IMPRESSION: Stable cardiomegaly and chronic pulmonary vascular congestion. No acute findings . Electronically Signed   By: Earle Gell M.D.   On: 05/03/2017 18:45    Results/Tests Pending at Time of Discharge: N/A  Discharge Medications:  Allergies as of 05/09/2017   No Known Allergies     Medication List    STOP taking these medications   Rivaroxaban 15 MG Tabs tablet Commonly known as:  XARELTO     TAKE these medications   acetaminophen 500 MG tablet Commonly known as:  TYLENOL Take 1 tablet (500 mg total) by mouth every 6 (six) hours as needed for moderate pain. What changed:  how much to take   ALEVE 220 MG tablet Generic drug:  naproxen sodium Take 220 mg by mouth every 12 (twelve) hours as needed (pain).   amLODipine 5 MG tablet Commonly known as:  NORVASC Take 5 mg by mouth daily.   cholecalciferol 1000 units tablet Commonly known as:  VITAMIN D Take 1,000 Units by mouth daily.   enoxaparin 150 MG/ML injection Commonly known as:  LOVENOX Inject 1.07 mLs (160 mg total) into the skin every 12 (twelve) hours. (dispense 16m syringes) Inject two (2) 871msyringes into the skin every 12 hours.   ferrous sulfate 325 (65 FE) MG tablet Take 325 mg by mouth daily.   glyBURIDE 5 MG tablet Commonly known as:  DIABETA Take 5 mg by mouth at bedtime as needed (high blood sugar).   ipratropium-albuterol 0.5-2.5 (3) MG/3ML Soln Commonly known as:  DUONEB Take 3 mLs by nebulization every 4 (four) hours as needed.   lisinopril 10 MG tablet Commonly known as:  PRINIVIL,ZESTRIL Take 1 tablet (10 mg total) by mouth daily.   metFORMIN 1000 MG tablet Commonly known as:  GLUCOPHAGE Take 1,000 mg by mouth 2 (two) times daily.   multivitamin with minerals Tabs tablet Take 1 tablet by mouth daily.    OXYGEN Inhale 5 L into the lungs continuous.   pantoprazole 40 MG tablet Commonly known as:  PROTONIX Take 1 tablet (40 mg total) by mouth daily.   PRESCRIPTION MEDICATION Inhale into the lungs as needed (whenever sleeping - naps or at night). CPAP   torsemide 20 MG tablet Commonly known as:  DEMADEX Take 40 mg by mouth at bedtime.   warfarin 5 MG tablet Commonly known as:  COUMADIN Take 2 tablets (10 mg total) by mouth daily. MUST attend followup appts for monitoring and dose adjustments   warfarin 2.5 MG tablet Commonly known as:  COUMADIN Take 6 tablets (15 mg total) by mouth daily. MUST have INR check 8/17 and 8/20 to manage lovenox/warfarin bridge with pharmacy management       Discharge Instructions: Please refer to Patient Instructions section of EMR for full details.  Patient was counseled important signs and symptoms  that should prompt return to medical care, changes in medications, dietary instructions, activity restrictions, and follow up appointments.   Follow-Up Appointments: Contact information for after-discharge care    [facility] .   Specialty:  [place] information: [address] [place], [place], DO 05/09/2017, 12:41 PM PGY-1, [place]

## 2017-05-05 NOTE — Progress Notes (Signed)
Called by nurse a look at patient due to decrease in Sp02 levels while on CPAP. Patient was wearing CPAP set at 7cm and oxygen set at 5lpm as per home. Patient has no c/o being short of breath at this time. Albuterol treatment was given and CPAP settings were changed to Bipap in an attempt to help with SP02 levels.

## 2017-05-05 NOTE — Progress Notes (Signed)
PT Cancellation Note  Patient Details Name: Roberto [name] MRN: [id number] DOB: [birth year]   Cancelled Treatment:    Reason Eval/Treat Not Completed: Fatigue/lethargy limiting ability to participate.  Patient declined PT x2 today due to fatigue.  Will return in am for PT evaluation.   Despina Pole 05/05/2017, 7:28 PM Carita Pian. Sanjuana Kava, [place] Pager [phone]

## 2017-05-05 NOTE — Progress Notes (Signed)
Inpatient Rehabilitation  Per OT request patient was screened by Gunnar Fusi for appropriateness for an Inpatient Acute Rehab consult.  At this time we await PT evaluation and recommendations.  Plan to follow up after PT.  Carmelia Roller., CCC/SLP Admission Coordinator  [place]  Cell [phone]

## 2017-05-05 NOTE — Progress Notes (Signed)
Family Medicine Teaching Service Daily Progress Note Intern Pager: [phone]  Patient name: Roberto [name] Medical record number: [id number] Date of birth: 10/03/1960 Age: 56 y.o. Gender: male  Primary Care Provider: Sherene Sires, DO Consultants:  Code Status: Full  Pt Overview and Major Events to Date:  Roberto [name] a 56 y.o.malepresenting with 5 days left leg pain shown by [place] to be saphenous vein clot. PMH is significant for recurrent DVT, Afib, CHF (EF 60-65% with severe concentric hypertrophy), OSA on CPAP, HTN.   Overnight on 8/9-8/10 he had bradycardia intermittently into the 20s-30s when trying to sleep without his CPAP machine but that appears to be machine error due to afib and patient was consistently asymptomatic and ECG 8/11 was consistent with afib.  Assessment and Plan: Roberto [name] a 56 y.o.malepresenting with 5 days left leg pain. PMH is significant for recurrent DVT, Afib, CHF (EF 60-65% with severe concentric hypertrophy), OSA on CPAP, HTN.  L leg pain with thrombosis of L greater saphenous vein: Darkening of LLE and asymmetric swelling compared to R. Had acute DVT of left femoral vein and left popliteal vein during last hospitalization and repeat LE doppler in ED showed no propagation but superficial thrombosis of L greater saphenous vein from ankle to mid thigh.Palpable pulses so not suspecting compartment syndrome. Mild leukocytosis and had fevers/chills but more likely attributable to clot burden.  Not considering clot-busting procedures due to risk per vascular.Currenlty bridging lovenox to warfarin -on telemetry, Attending Dr. Ardelia Mems -vascular consulted, recommended transition tocoumadin -coumadin per pharmacy -lovenox bridge (5days (2 of which must be therapuetic range) starting evening 8/10) -routine pulse/sensation checks  -tramadol 50 mg q6h prn and tylenol 597m q6h prn for pain control -PT/OT eval -Follow INR -outpatient followup arranged  with family medicine clinic 4pm Monday, 9am wed, [facility] Chronic Dyspnea: Improved since last hospitalizationper patient. Weight down since discharge (260 lbs vs 271 lbs).  - Continue hometorsemide 40 mg QHS - Strict I/O, monitor daily weights - Continue cardiac monitor - Continue home oxygen (5L)  -flonase for nasal decongestion -ordered 1x duoneb  CHF: Hometorsemide. On home 5L for chronic respiratory failure and maintaining appropriate O2 sats. Echo performed 7/22 which revealed 60-65% EF with pulm hypertension. - Continue hometorsemide - Strict I/O, monitor daily weights - Continue home lisinopril at 10 mg (decreased during last admission) - Continue cardiac monitor - Continue home oxygen (5L)  Atrial fibrillation w/o RVR: chad-vasc score 6. Not on rate or rhythm control. Previously on Xarelto.Denies history of GI bleed. HR mid 60s during intake examination - stophome Xarelto - coumadin per pharmacy/lovenox bridge started 8/10 - cardiac monitor - Monitor vitals  AKI/?CKD: Unsure of his baseline (~1.2?). Cr slightly increased to 1.33 -Daily BMP -avoid NSAIDs -monitor weights and would not try to further diurese as below last discharge weight -Check CK as has been less mobile due to pain  [facility] Last A1c 7.3 on 04/14/17. Controlled on metformin and glyburide at home.  - sensitive SSI insulin and qachs CBGs ordered  Asthma/COPD: No prior formal diagnosis. Never smoker. No wheezing on exam. On home 5L [place].  -PRN dounebs -would refer to Pulmonology outpatient, as also had pulmonary nodule noted during last hospitalization that will require monitoring and patient suspects CPAP machine will need replacing soon  Hypertension: 109-158/62-93 -Continuehome lisinopril and amlodipine  OSA/?OHS -Nightly CPAP  Lung nodule:7 mm solid-appearing nodule noted in the right lung on CTA. Follow up CT at  18-24 months  recommended.   FEN/GI:heart healthy/carb modified diet  Prophylaxis: on full anticoagulation  Disposition: pending therapeutic INR and PT/OT evals, can follow with [facility], currently trying to coordinate meds with pharmacy  Subjective:  Felt well, even when monitor was reading his afib as a rate of 30s and O2 sat was falsely indicating mid 60s.   Would like to go home as he says he can't sleep in the hospital.  He is confident he can handle lovenox outpatient and will follow with [facility] clinc  Objective: Temp:  [97.8 F (36.6 C)-98.4 F (36.9 C)] 98 F (36.7 C) (08/11 0627) Pulse Rate:  [63-74] 73 (08/11 0627) Resp:  [13-21] 14 (08/11 0627) BP: (103-136)/(59-76) 103/59 (08/11 0627) SpO2:  [93 %-97 %] 94 % (08/11 0627) Weight:  [358 lb (162.4 kg)] 358 lb (162.4 kg) (08/11 4859) Physical Exam: General: uncomfortable but cheerful, alert and conversational, obese, on cpap at morning exam.   Needed assistance to lift his leg from floor to bed. Eyes: PERRL, EOMI ENTM: no nasal discharge, conjunctivitis, MMM Cardiovascular: irregular heart rate (afib showing on monitor), but ventricular rate was ~60-70,no murmur appreciated though difficult exam due to habitus; palpable DP pulse of LLE and faint PT pulse.  Respiratory: satting mid 90s on 5L (home dose)/CPAP by end of exam,no increased WOB, CTAB Gastrointestinal: soft belly with no tendernesss to deep palpation, bowel sounds in 4 quadrants MSK: improved asymmetrical swelling/discoloration in the left lower leg. No pitting edema of LLE. Chronic venous stasis changes. Derm: skin changes including color to deep purple/black/red, texture to thick on left lower leg. Patient complained of pain in knee while plantarflexing L foot and extending knee. Neuro: slightly decreasedsensation on L lower leg, negative Babinski  Psych: appropriate and pleasant  Laboratory:  Recent Labs Lab 05/03/17 1730 05/04/17 0524 05/05/17 0533  WBC 12.7*  9.2 9.9  HGB 15.5 14.3 14.2  HCT 48.1 46.8 44.2  PLT 351 298 295    Recent Labs Lab 05/03/17 1730 05/05/17 0533  NA 141 138  K 4.1 4.2  CL 101 99*  CO2 30 30  BUN 25* 21*  CREATININE 1.33* 1.17  CALCIUM 9.4 9.3  PROT 7.7  --   BILITOT 1.4*  --   ALKPHOS 80  --   ALT 16*  --   AST 27  --   GLUCOSE 111* 126*     Imaging/Diagnostic Tests: Dg Chest 2 View  Result Date: 05/03/2017 CLINICAL DATA:  Low-grade fever.  Recent pneumonia. EXAM: CHEST  2 VIEW COMPARISON:  04/13/2017 FINDINGS: Moderate cardiomegaly and chronic pulmonary vascular congestion are stable in appearance. No evidence of acute infiltrate or pleural effusion. IMPRESSION: Stable cardiomegaly and chronic pulmonary vascular congestion. No acute findings . Electronically Signed   By: Earle Gell M.D.   On: 05/03/2017 18:45     Sherene Sires, DO 05/05/2017, 10:47 AM PGY-1, [place] Intern pager: [phone], text pages welcome

## 2017-05-05 NOTE — Progress Notes (Signed)
RT set up patient CPAP HS. Patient is able to place himself on when he is ready.

## 2017-05-06 LAB — CBC
HCT: 42.8 % (ref 39.0–52.0)
HEMOGLOBIN: 13.7 g/dL (ref 13.0–17.0)
MCH: 28.1 pg (ref 26.0–34.0)
MCHC: 32 g/dL (ref 30.0–36.0)
MCV: 87.9 fL (ref 78.0–100.0)
Platelets: 307 10*3/uL (ref 150–400)
RBC: 4.87 MIL/uL (ref 4.22–5.81)
RDW: 14.8 % (ref 11.5–15.5)
WBC: 8.9 10*3/uL (ref 4.0–10.5)

## 2017-05-06 LAB — BASIC METABOLIC PANEL
Anion gap: 8 (ref 5–15)
BUN: 24 mg/dL — AB (ref 6–20)
CHLORIDE: 100 mmol/L — AB (ref 101–111)
CO2: 31 mmol/L (ref 22–32)
Calcium: 8.9 mg/dL (ref 8.9–10.3)
Creatinine, Ser: 1.27 mg/dL — ABNORMAL HIGH (ref 0.61–1.24)
GFR calc Af Amer: >60 mL/min (ref >60)
GFR calc non Af Amer: >60 mL/min (ref >60)
GLUCOSE: 137 mg/dL — AB (ref 65–99)
POTASSIUM: 3.9 mmol/L (ref 3.5–5.1)
SODIUM: 139 mmol/L (ref 135–145)

## 2017-05-06 LAB — GLUCOSE, CAPILLARY
GLUCOSE-CAPILLARY: 126 mg/dL — AB (ref 65–99)
GLUCOSE-CAPILLARY: 145 mg/dL — AB (ref 65–99)
GLUCOSE-CAPILLARY: 157 mg/dL — AB (ref 65–99)
Glucose-Capillary: 143 mg/dL — ABNORMAL HIGH (ref 65–99)

## 2017-05-06 LAB — PROTIME-INR
INR: 1.23
Prothrombin Time: 15.6 seconds — ABNORMAL HIGH (ref 11.4–15.2)

## 2017-05-06 MED ORDER — WARFARIN SODIUM 7.5 MG PO TABS
15.0000 mg | ORAL_TABLET | Freq: Once | ORAL | Status: AC
  Administered 2017-05-06: 15 mg via ORAL
  Filled 2017-05-06: qty 2

## 2017-05-06 NOTE — Plan of Care (Signed)
Problem: Pain Managment: Goal: General experience of comfort will improve Outcome: Progressing Patient has not complained of pain thus far into the shift.

## 2017-05-06 NOTE — Evaluation (Signed)
Physical Therapy Evaluation Patient Details Name: Roberto [name] MRN: [id number] DOB: [birth year] Today's Date: 05/06/2017   History of Present Illness   Roberto [name] is a 55 y.o. male presenting with 5 days left leg pain found to have superficial thrombosis of the left greater saphenous vein coursing from the ankle through the mid thigh.  Positive for DVT left LE confirmed last admission as well.  PMH is significant for asthma/copd, chf, copd, t2dm, osa, dvt in left leg, afib, htn, obesity  Clinical Impression  Patient presents with problems listed below.  Will benefit from acute PT to maximize functional mobility prior to discharge.  Patient was independent with mobility, gait, ADL.  Today, patient unable to transfer or ambulate.  Able to stand with max effort and assist.  Recommend Inpatient Rehab consult with goal to reach optimal functional mobility prior to return home.    Follow Up Recommendations CIR;Supervision/Assistance - 24 hour    Equipment Recommendations  Rolling walker with 5" wheels;Wheelchair (measurements PT);Wheelchair cushion (measurements PT) (Bariatric equipment)    Recommendations for Other Services Rehab consult     Precautions / Restrictions Precautions Precautions: Fall Precaution Comments: Trouble with monitor in room Restrictions Weight Bearing Restrictions: No      Mobility  Bed Mobility Overal bed mobility: Needs Assistance Bed Mobility: Supine to Sit;Sit to Supine     Supine to sit: Supervision;HOB elevated     General bed mobility comments: Supervision for safety only  Transfers Overall transfer level: Needs assistance Equipment used: Rolling walker (2 wheeled) Transfers: Sit to/from Stand Sit to Stand: Max assist;From elevated surface         General transfer comment: Verbal cues for hand placement.  Patient able to reach upright standing position with great effort, and majority of weight on RLE.  Stood approx 10 seconds and had to  return to sitting.  Patient unable to take any steps in stance.  Ambulation/Gait             General Gait Details: Unable  Financial trader Rankin (Stroke Patients Only)       Balance   Sitting-balance support: No upper extremity supported;Feet supported Sitting balance-Leahy Scale: Good     Standing balance support: Bilateral upper extremity supported Standing balance-Leahy Scale: Poor                               Pertinent Vitals/Pain Pain Assessment: 0-10 Pain Score: 4  Pain Location: LLE with movement and WB'ing, esp knee Pain Descriptors / Indicators: Grimacing;Guarding;Sore Pain Intervention(s): Monitored during session;Repositioned    Home Living Family/patient expects to be discharged to:: Skilled nursing facility Living Arrangements: Children (Staying with daughter until he is better) Available Help at Discharge: Family;Available PRN/intermittently Type of Home: House Home Access: Stairs to enter Entrance Stairs-Rails: Left Entrance Stairs-Number of Steps: 7--landing-7 Home Layout: One level Home Equipment: None      Prior Function Level of Independence: Independent               Hand Dominance   Dominant Hand: Right    Extremity/Trunk Assessment   Upper Extremity Assessment Upper Extremity Assessment: Defer to OT evaluation    Lower Extremity Assessment Lower Extremity Assessment: RLE deficits/detail;LLE deficits/detail RLE Deficits / Details: Strength grossly 3-/5 RLE Coordination: decreased gross motor LLE Deficits / Details: Strength grossly 2/5 - 2+/5; Edema; Painful knee  with movement/weightbearing LLE: Unable to fully assess due to pain LLE Coordination: decreased gross motor    Cervical / Trunk Assessment Cervical / Trunk Assessment: Normal  Communication   Communication: No difficulties  Cognition Arousal/Alertness: Awake/alert Behavior During Therapy: WFL for tasks  assessed/performed Overall Cognitive Status: Within Functional Limits for tasks assessed                                        General Comments      Exercises General Exercises - Lower Extremity Quad Sets: AROM;Both;5 reps;Supine Gluteal Sets: AROM;Both;5 reps;Supine   Assessment/Plan    PT Assessment Patient needs continued PT services  PT Problem List Decreased strength;Decreased activity tolerance;Decreased balance;Decreased mobility;Decreased coordination;Decreased knowledge of use of DME;Cardiopulmonary status limiting activity;Obesity;Pain       PT Treatment Interventions DME instruction;Gait training;Functional mobility training;Therapeutic activities;Therapeutic exercise;Patient/family education    PT Goals (Current goals can be found in the Care Plan section)  Acute Rehab PT Goals Patient Stated Goal: To get stronger PT Goal Formulation: With patient Time For Goal Achievement: 05/13/17 Potential to Achieve Goals: Good    Frequency Min 3X/week   Barriers to discharge Inaccessible home environment 7 stairs to enter    Co-evaluation               AM-PAC PT "6 Clicks" Daily Activity  Outcome Measure Difficulty turning over in bed (including adjusting bedclothes, sheets and blankets)?: None Difficulty moving from lying on back to sitting on the side of the bed? : None Difficulty sitting down on and standing up from a chair with arms (e.g., wheelchair, bedside commode, etc,.)?: Total Help needed moving to and from a bed to chair (including a wheelchair)?: Total Help needed walking in hospital room?: Total Help needed climbing 3-5 steps with a railing? : Total 6 Click Score: 12    End of Session Equipment Utilized During Treatment: Oxygen Activity Tolerance: Patient limited by pain;Patient limited by fatigue Patient left: in bed;with call bell/phone within reach Nurse Communication: Mobility status;Need for lift equipment PT Visit  Diagnosis: Unsteadiness on feet (R26.81);Muscle weakness (generalized) (M62.81);Difficulty in walking, not elsewhere classified (R26.2);Pain Pain - Right/Left: Left Pain - part of body: Leg    Time: 9509-3267 PT Time Calculation (min) (ACUTE ONLY): 27 min   Charges:   PT Evaluation $PT Eval Moderate Complexity: 1 Mod PT Treatments $Therapeutic Activity: 8-22 mins   PT G Codes:        Carita Pian. Sanjuana Kava, [facility] Acute Rehab Services Pager [place] 05/06/2017, 4:30 PM

## 2017-05-06 NOTE — Progress Notes (Signed)
Family Medicine Teaching Service Daily Progress Note Intern Pager: [phone]  Patient name: Roberto [name] Medical record number: [id number] Date of birth: 05-06-1961 Age: 56 y.o. Gender: male  Primary Care Provider: Sherene Sires, DO Consultants:  Code Status: Full  Pt Overview and Major Events to Date:  Roberto [name] a 56 y.o.malepresenting with 5 days left leg pain shown by [place] to be saphenous vein clot. PMH is significant for recurrent DVT, Afib, CHF (EF 60-65% with severe concentric hypertrophy), OSA on CPAP, HTN.   Overnight on 8/9-8/10 he had bradycardia intermittently into the 20s-30s when trying to sleep without his CPAP machine but that appears to be machine error due to afib and patient was consistently asymptomatic and ECG 8/11 was consistent with afib.  Assessment and Plan: Roberto [name] a 56 y.o.malepresenting with 5 days left leg pain. PMH is significant for recurrent DVT, Afib, CHF (EF 60-65% with severe concentric hypertrophy), OSA on CPAP, HTN.  L leg pain with thrombosis of L greater saphenous vein: Darkening of LLE and asymmetric swelling compared to R. Had acute DVT of left femoral vein and left popliteal vein during last hospitalization and repeat LE doppler in ED showed no propagation but superficial thrombosis of L greater saphenous vein from ankle to mid thigh.Currenlty bridging lovenox to warfarin.  -on telemetry -vascular consulted, recommended transition tocoumadin -coumadin per pharmacy -lovenox bridge (5days (2 of which must be therapuetic range) starting evening 8/10) -routine pulse/sensation checks  -tramadol 50 mg q6h prn and tylenol 556m q6h prn for pain control -PT/OT eval -Follow INR   Chronic Dyspnea: Improved since last hospitalizationper patient. Weight down since discharge (260 lbs vs 271 lbs).  - Continue hometorsemide 40 mg QHS - Strict I/O, monitor daily weights - Continue cardiac monitor - Continue home oxygen (5L)  -flonase  for nasal decongestion   CHF: Hometorsemide. On home 5L for chronic respiratory failure and maintaining appropriate O2 sats. Echo performed 7/22 which revealed 60-65% EF with pulm hypertension. - Continue hometorsemide - Strict I/O, monitor daily weights - Continue home lisinopril at 10 mg (decreased during last admission) - Continue cardiac monitor - Continue home oxygen (5L)  Atrial fibrillation w/o RVR: chad-vasc score 6. Not on rate or rhythm control. Previously on Xarelto.Denies history of GI bleed. - coumadin per pharmacy/lovenox bridge started 8/10 - cardiac monitor - Monitor vitals  AKI/?CKD: Unsure of his baseline (~1.2?). Cr 1.27 today. CK normal at 35.  -Daily BMP -avoid NSAIDs -monitor weights   T2DM: CBGs stable.  Last A1c 7.3 on 04/14/17. Controlled on metformin and glyburide at home.  - sensitive SSI insulin and qachs CBGs   Asthma/COPD: No prior formal diagnosis. Never smoker.On home 5L [place].  -PRN dounebs -would refer to Pulmonology outpatient, as also had pulmonary nodule noted during last hospitalization that will require monitoring and patient suspects CPAP machine will need replacing soon  Hypertension: Stable.  -Continuehome lisinopril and amlodipine  OSA/?OHS -Nightly CPAP  Lung nodule:7 mm solid-appearing nodule noted in the right lung on CTA. Follow up CT at 18-24 months recommended.   FEN/GI:heart healthy/carb modified diet  Prophylaxis: on full anticoagulation  Disposition: pending therapeutic INR and PT/OT evals, PT/OT recommends CIR   Subjective:  Feeling well this morning. He is strong enough to sit on side of bed. Willing to work with PT today.   Objective: Temp:  [98.9 F (37.2 C)-99.9 F (37.7 C)] 99.2 F (37.3 C) (08/12 0804) Pulse Rate:  [66-81] 72 (08/12 0804) Resp:  [16-21] 18 (08/12 0804) BP: (  119-144)/(65-86) 129/86 (08/12 0804) SpO2:  [91 %-94 %] 91 % (08/12 0804) Physical Exam: General: lying in bed,  alert and conversational, obese, NAD Cardiovascular: irregularly irregular,no murmur appreciated; palpable DP pulse of LLE and faint PT pulse Respiratory: satting mid 90s on 5L (home dose),no increased WOB, CTAB Gastrointestinal: soft belly with no tendernesss to deep palpation, bowel sounds in 4 quadrants MSK: improved asymmetrical swelling/discoloration in the left lower leg. No pitting edema of LLE. Chronic venous stasis changes. Derm: skin changes including color to deep purple/black/red, texture to thick on left lower leg. Patient complained of pain in knee while plantarflexing L foot and extending knee. Neuro: slightly decreasedsensation on L lower leg Psych: appropriate and pleasant  Laboratory:  Recent Labs Lab 05/04/17 0524 05/05/17 0533 05/06/17 0301  WBC 9.2 9.9 8.9  HGB 14.3 14.2 13.7  HCT 46.8 44.2 42.8  PLT 298 295 307    Recent Labs Lab 05/03/17 1730 05/05/17 0533 05/06/17 0301  NA 141 138 139  K 4.1 4.2 3.9  CL 101 99* 100*  CO2 _0 BUN 25* 21* 24*  CREATININE 1.33* 1.17 1.27*  CALCIUM 9.4 9.3 8.9  PROT 7.7  --   --   BILITOT 1.4*  --   --   ALKPHOS 80  --   --   ALT 16*  --   --   AST 27  --   --   GLUCOSE 111* 126* 137*     Imaging/Diagnostic Tests: Dg Chest 2 View  Result Date: 05/03/2017 CLINICAL DATA:  Low-grade fever.  Recent pneumonia. EXAM: CHEST  2 VIEW COMPARISON:  04/13/2017 FINDINGS: Moderate cardiomegaly and chronic pulmonary vascular congestion are stable in appearance. No evidence of acute infiltrate or pleural effusion. IMPRESSION: Stable cardiomegaly and chronic pulmonary vascular congestion. No acute findings . Electronically Signed   By: Earle Gell M.D.   On: 05/03/2017 18:45     Nicolette Bang, DO 05/06/2017, 8:28 AM PGY-3, [place] Intern pager: [phone], text pages welcome

## 2017-05-06 NOTE — Progress Notes (Signed)
RT set up patient CPAP HS on auto. 6L bleed in needed. Patient is able to place himself on CPAP when ready. No assistance is needed.

## 2017-05-06 NOTE — Progress Notes (Signed)
Inpatient Rehabilitation  Note that PT is also recommending CIR for post acute therapies. Text paged MD to request IP Rehab consult order.    Carmelia Roller., CCC/SLP Admission Coordinator  [facility]  Cell [phone]

## 2017-05-06 NOTE — Progress Notes (Signed)
[place] for Lovenox/Warfarin Indication: DVT/Afib  No Known Allergies  Patient Measurements: Height: 6' (182.9 cm) Weight: (!) 358 lb (162.4 kg) IBW/kg (Calculated) : 77.6  Vital Signs: Temp: 99.2 F (37.3 C) (08/12 0804) Temp Source: Oral (08/12 0804) BP: 129/86 (08/12 0804) Pulse Rate: 72 (08/12 0804)  Labs:  Recent Labs  05/03/17 1730  05/03/17 2145 05/04/17 0524 05/04/17 0656 05/04/17 1256 05/04/17 2153 05/05/17 0533 05/06/17 0301  HGB 15.5  --   --  14.3  --   --   --  14.2 13.7  HCT 48.1  --   --  46.8  --   --   --  44.2 42.8  PLT 351  --   --  298  --   --   --  295 307  APTT  --   < > 44* 50*  --  70* 58*  --   --   LABPROT  --   --  20.8*  --   --   --   --  15.2 15.6*  INR  --   --  1.76  --   --   --   --  1.19 1.23  HEPARINUNFRC  --   --  >2.20*  --   --   --   --   --   --   CREATININE 1.33*  --   --   --   --   --   --  1.17 1.27*  CKTOTAL  --   --   --   --  35*  --   --   --   --   < > = values in this interval not displayed.  Estimated Creatinine Clearance: 102.4 mL/min (A) (by C-G formula based on SCr of 1.27 mg/dL (H)).   Medical History: Past Medical History:  Diagnosis Date  . Asthma   . CHF (congestive heart failure) ([place])   . COPD (chronic obstructive pulmonary disease) ([place])   . Diabetes mellitus without complication ([place])   . Hypertension     Medications:  Prescriptions Prior to Admission  Medication Sig Dispense Refill Last Dose  . acetaminophen (TYLENOL) 500 MG tablet Take 1,000 mg by mouth every 6 (six) hours as needed for moderate pain.   05/03/2017 at Unknown time  . amLODipine (NORVASC) 5 MG tablet Take 5 mg by mouth daily.  1 05/03/2017 at Unknown time  . cholecalciferol (VITAMIN D) 1000 units tablet Take 1,000 Units by mouth daily.  1 05/03/2017 at Unknown time  . ferrous sulfate 325 (65 FE) MG tablet Take 325 mg by mouth daily.   05/03/2017 at Unknown time  . glyBURIDE (DIABETA) 5 MG tablet  Take 5 mg by mouth at bedtime as needed (high blood sugar).   0 Past Month at Unknown time  . lisinopril (PRINIVIL,ZESTRIL) 10 MG tablet Take 1 tablet (10 mg total) by mouth daily. 30 tablet 11 05/03/2017 at Unknown time  . metFORMIN (GLUCOPHAGE) 1000 MG tablet Take 1,000 mg by mouth 2 (two) times daily.  1 05/03/2017 at Unknown time  . Multiple Vitamin (MULTIVITAMIN WITH MINERALS) TABS tablet Take 1 tablet by mouth daily.   05/03/2017 at Unknown time  . OXYGEN Inhale 5 L into the lungs continuous.   05/03/2017 at ongoing  . Rivaroxaban (XARELTO) 15 MG TABS tablet Take 1 tablet (15 mg total) by mouth 2 (two) times daily with a meal. 36 tablet 0 05/03/2017 at 1100  . torsemide (DEMADEX)  20 MG tablet Take 40 mg by mouth at bedtime.  5 05/02/2017 at Unknown time  . naproxen sodium (ALEVE) 220 MG tablet Take 220 mg by mouth every 12 (twelve) hours as needed (pain).   unknown  . PRESCRIPTION MEDICATION Inhale into the lungs as needed (whenever sleeping - naps or at night). CPAP   unknown   Scheduled:  . amLODipine  5 mg Oral Daily  . coumadin book   Does not apply Once  . enoxaparin (LOVENOX) injection  165 mg Subcutaneous Q12H  . ferrous sulfate  325 mg Oral Daily  . fluticasone  1 spray Each Nare Daily  . insulin aspart  0-9 Units Subcutaneous TID WC  . ipratropium-albuterol  3 mL Nebulization BID  . lisinopril  10 mg Oral Daily  . pantoprazole  40 mg Oral Daily  . torsemide  40 mg Oral QHS  . warfarin   Does not apply Once  . Warfarin - Pharmacist Dosing Inpatient   Does not apply q1800    Assessment: 56yo male with new DVT while on Xarelto for Afib, was on heparin bridge now switched to Lovenox bridge to Coumadin. INR slowly trending up, today 1.23 from 1.19 post warfarin 10 mg po. Hgb and plts wnl. No bleeding reported.   Goal of Therapy:  INR 2-3  Plan:  - Warfarin 15 mg po x 1  - Continue Lovenox 165 mg SQ q12 - Daily INR - CBC q72h  Leroy Libman, PharmD Pharmacy Resident Pager:  [phone]

## 2017-05-07 ENCOUNTER — Inpatient Hospital Stay: Payer: Medicare Other | Admitting: Family Medicine

## 2017-05-07 ENCOUNTER — Encounter (HOSPITAL_COMMUNITY): Payer: Self-pay | Admitting: Physical Medicine and Rehabilitation

## 2017-05-07 DIAGNOSIS — Z6841 Body Mass Index (BMI) 40.0 and over, adult: Secondary | ICD-10-CM

## 2017-05-07 DIAGNOSIS — R001 Bradycardia, unspecified: Secondary | ICD-10-CM

## 2017-05-07 DIAGNOSIS — I5022 Chronic systolic (congestive) heart failure: Secondary | ICD-10-CM

## 2017-05-07 DIAGNOSIS — I5032 Chronic diastolic (congestive) heart failure: Secondary | ICD-10-CM

## 2017-05-07 DIAGNOSIS — I10 Essential (primary) hypertension: Secondary | ICD-10-CM

## 2017-05-07 DIAGNOSIS — R0600 Dyspnea, unspecified: Secondary | ICD-10-CM

## 2017-05-07 DIAGNOSIS — I82412 Acute embolism and thrombosis of left femoral vein: Secondary | ICD-10-CM

## 2017-05-07 DIAGNOSIS — I480 Paroxysmal atrial fibrillation: Secondary | ICD-10-CM

## 2017-05-07 LAB — PROTIME-INR
INR: 1.22
PROTHROMBIN TIME: 15.5 s — AB (ref 11.4–15.2)

## 2017-05-07 LAB — BASIC METABOLIC PANEL
ANION GAP: 9 (ref 5–15)
BUN: 27 mg/dL — ABNORMAL HIGH (ref 6–20)
CALCIUM: 8.8 mg/dL — AB (ref 8.9–10.3)
CO2: 27 mmol/L (ref 22–32)
Chloride: 100 mmol/L — ABNORMAL LOW (ref 101–111)
Creatinine, Ser: 1.19 mg/dL (ref 0.61–1.24)
GFR calc Af Amer: >60 mL/min (ref >60)
GLUCOSE: 145 mg/dL — AB (ref 65–99)
POTASSIUM: 3.6 mmol/L (ref 3.5–5.1)
SODIUM: 136 mmol/L (ref 135–145)

## 2017-05-07 LAB — CBC
HCT: 41.7 % (ref 39.0–52.0)
Hemoglobin: 13.7 g/dL (ref 13.0–17.0)
MCH: 28.6 pg (ref 26.0–34.0)
MCHC: 32.9 g/dL (ref 30.0–36.0)
MCV: 87.1 fL (ref 78.0–100.0)
PLATELETS: 301 10*3/uL (ref 150–400)
RBC: 4.79 MIL/uL (ref 4.22–5.81)
RDW: 14.6 % (ref 11.5–15.5)
WBC: 10.2 10*3/uL (ref 4.0–10.5)

## 2017-05-07 LAB — GLUCOSE, CAPILLARY
GLUCOSE-CAPILLARY: 136 mg/dL — AB (ref 65–99)
GLUCOSE-CAPILLARY: 153 mg/dL — AB (ref 65–99)
Glucose-Capillary: 122 mg/dL — ABNORMAL HIGH (ref 65–99)
Glucose-Capillary: 172 mg/dL — ABNORMAL HIGH (ref 65–99)

## 2017-05-07 MED ORDER — ACETAMINOPHEN 500 MG PO TABS
500.0000 mg | ORAL_TABLET | Freq: Four times a day (QID) | ORAL | Status: DC
  Administered 2017-05-07 – 2017-05-09 (×5): 500 mg via ORAL
  Filled 2017-05-07 (×5): qty 1

## 2017-05-07 MED ORDER — WARFARIN SODIUM 7.5 MG PO TABS
15.0000 mg | ORAL_TABLET | Freq: Once | ORAL | Status: AC
  Administered 2017-05-07: 15 mg via ORAL
  Filled 2017-05-07: qty 2

## 2017-05-07 MED ORDER — IPRATROPIUM-ALBUTEROL 0.5-2.5 (3) MG/3ML IN SOLN: 3.0000 mL | RESPIRATORY_TRACT | Status: DC | PRN

## 2017-05-07 NOTE — Clinical Social Work Note (Signed)
Clinical Social Work Assessment  Patient Details  Name: Roberto [name] MRN: [id number] Date of Birth: 08/15/1961  Date of referral:  05/07/17               Reason for consult:  Facility Placement (backup to CIR)                Permission sought to share information with:  Family Supports, Facility Contact Representative Permission granted to share information::  Yes, Verbal Permission Granted  Name::        Agency::  SNFs  Relationship::     Contact Information:     Housing/Transportation Living arrangements for the past 2 months:  Single Family Home Source of Information:  Patient Patient Interpreter Needed:  None Criminal Activity/Legal Involvement Pertinent to Current Situation/Hospitalization:  No - Comment as needed Significant Relationships:  Adult Children, Other Family Members Lives with:  Adult Children Do you feel safe going back to the place where you live?  Yes Need for family participation in patient care:  No (Coment)  Care giving concerns: Patient from home with daughter. Patient recently moved in with [place] [facility]'s current apartment is on the second floor.   Social Worker assessment / plan: CSW met with patient at bedside. Patient alert and oriented, with pleasant demeanor. CSW explained recommendation to workup for SNF as backup to CIR. CIR physician evaluation pending. Patient agreeable to workup for SNF. Patient prefers CIR and is hopeful they will be able to take him. Patient agreeable to SNF if CIR does not work out. Patient prefers facilities in , as he currently lives with his daughter here. Currently their apartment is on the second floor, but they are looking for another apartment on the first floor, given patient mobility challenges. CSW will fax out initial SNF referrals and continue to follow for CIR recommendations, and will support with discharge planning if needed.  Employment status:  Disabled (Comment on  whether or not currently receiving Disability) Insurance information:  Medicare PT Recommendations:  Inpatient Rehab Consult Information / Referral to community resources:  Skilled Nursing Facility  Patient/Family's Response to care: Patient appreciative of care.  Patient/Family's Understanding of and Emotional Response to Diagnosis, Current Treatment, and Prognosis: Patient understanding of conditions and hopeful for mobility recovery. Patient with reasonable expectations, though hopeful for CIR placement.   Emotional Assessment Appearance:  Appears stated age Attitude/Demeanor/Rapport:  Other (appropriate) Affect (typically observed):  Calm, Pleasant, Appropriate Orientation:  Oriented to Self, Oriented to Place, Oriented to  Time, Oriented to Situation Alcohol / Substance use:  Not Applicable Psych involvement (Current and /or in the community):  No (Comment)  Discharge Needs  Concerns to be addressed:  Discharge Planning Concerns Readmission within the last 30 days:  No Current discharge risk:  Physical Impairment Barriers to Discharge:  Continued Medical Work up    , LCSW 05/07/2017, 1:29 PM  

## 2017-05-07 NOTE — Progress Notes (Addendum)
Family Medicine Teaching Service Daily Progress Note Intern Pager: [phone]  Patient name: Roberto [name] Medical record number: [id number] Date of birth: 1961-07-16 Age: 56 y.o. Gender: male  Primary Care Provider: Sherene Sires, DO Consultants: PT Code Status: Full  Pt Overview and Major Events to Date:  Roberto [name] a 56 y.o.malepresenting with 5 days left leg pain shown by [place] to be saphenous vein clot. PMH is significant for recurrent DVT, Afib, CHF (EF 60-65% with severe concentric hypertrophy), OSA on CPAP, HTN. Overnight on 8/9-8/10 he had bradycardia intermittently into the 20s-30s when trying to sleep without his CPAP machine but that appears to be machine error due to afib and patient was consistently asymptomatic and ECG 8/11 was consistent with afib.  Assessment and Plan: Roberto [name] a 56 y.o.malepresenting with 5 days left leg pain. PMH is significant for recurrent DVT, Afib, CHF (EF 60-65% with severe concentric hypertrophy), OSA on CPAP, HTN.  L leg pain with thrombosis of L greater saphenous vein: Had Darkening of LLE and asymmetric swelling compared to R. Had acute DVT of left femoral vein and left popliteal vein during last hospitalization and repeat LE doppler in ED showed no propagation but superficial thrombosis of L greater saphenous vein from ankle to mid thigh.Currenlty bridging lovenox to warfarin.  -on telemetry -vascular consulted, recommended transition tocoumadin -coumadin per pharmacy -lovenox bridge (5days (2 of which must be therapuetic range) starting evening 8/10) -routine pulse/sensation checks  -tramadol 50 mg q6h prn and tylenol 553m q6h prn for pain control -PT/OT eval- recommended CIR -CIR to evaluate -Follow INR (1.22)  Chronic Dyspnea: Improved since last hospitalizationper patient. Weight down since discharge (260 lbs vs 271 lbs).  - Continue hometorsemide 40 mg QHS - Strict I/O, monitor daily weights - Continue cardiac  monitor - Continue home oxygen (5L)  -flonase for nasal decongestion   CHF: Hometorsemide. On home 5L for chronic respiratory failure and maintaining appropriate O2 sats. Echo performed 7/22 which revealed 60-65% EF with pulm hypertension. - Continue hometorsemide - Strict I/O, monitor daily weights - Continue home lisinopril at 10 mg (decreased during last admission) - Continue cardiac monitor - Continue home oxygen (5L)  Atrial fibrillation w/o RVR: chad-vasc score 6. Not on rate or rhythm control. Previously on Xarelto.Denies history of GI bleed. - coumadin per pharmacy/lovenox bridge started 8/10 - cardiac monitor - Monitor vitals  AKI/?CKD: Unsure of his baseline (~1.2?). Cr 1.27 today. CK normal at 35.  -Daily BMP -avoid NSAIDs -monitor weights   T2DM: CBGs stable.  Last A1c 7.3 on 04/14/17. Controlled on metformin and glyburide at home.  - sensitive SSI insulin and qachs CBGs   Asthma/COPD: No prior formal diagnosis. Never smoker.On home 5L [place].  -PRN dounebs -would refer to Pulmonology outpatient, as also had pulmonary nodule noted during last hospitalization that will require monitoring and patient suspects CPAP machine will need replacing soon  Hypertension: Stable.  -Continuehome lisinopril and amlodipine  OSA/?OHS -Nightly CPAP  Lung nodule:7 mm solid-appearing nodule noted in the right lung on CTA. Follow up CT at 18-24 months recommended.   FEN/GI:heart healthy/carb modified diet  Prophylaxis: on full anticoagulation  Disposition: pending therapeutic CIR evaluation, PT/OT recommends CIR   Subjective:  Patient says his left leg feels better and less swollen.   He thinks the sensation is closer to baseline now than a few days ago.   He is looking forward to PT but thinks he might have overdone it yesterday because he has some joint pain in his  R knee  Objective: Temp:  [98.6 F (37 C)-100.2 F (37.9 C)] 98.6 F (37 C) (08/13  0513) Pulse Rate:  [70-85] 70 (08/13 0513) Resp:  [15-21] 21 (08/13 0513) BP: (129-153)/(66-86) 145/82 (08/13 0513) SpO2:  [89 %-94 %] 94 % (08/13 0513) Weight:  [360 lb (163.3 kg)] 360 lb (163.3 kg) (08/13 0513) Physical Exam: General: lying in bed, alert and conversational, obese, NAD but breathing is slightly labored Cardiovascular: irregularly irregular,no murmur appreciated; palpable DP pulse of LLE and faint PT pulse although improved from prior days Respiratory: satting mid 90s on 5L (home dose),appears to have slightly increased WOB, no wheezes noted Gastrointestinal: soft belly with no tendernesss to deep palpation, bowel sounds in 4 quadrants MSK: improving asymmetrical swelling/discoloration in the left lower leg. No pitting edema of LLE. Chronic venous stasis changes.  New onset R knee pain after PT yesterday, patient thinks he worked too hard Derm: skin changes including color to deep purple/black/red, texture to thick on left lower leg.  Neuro: slightly decreasedsensation on L lower leg Psych: appropriate and pleasant  Laboratory:  Recent Labs Lab 05/05/17 0533 05/06/17 0301 05/07/17 0211  WBC 9.9 8.9 10.2  HGB 14.2 13.7 13.7  HCT 44.2 42.8 41.7  PLT 295 307 301    Recent Labs Lab 05/03/17 1730 05/05/17 0533 05/06/17 0301 05/07/17 0211  NA 141 138 139 136  K 4.1 4.2 3.9 3.6  CL 101 99* 100* 100*  CO2 _0 BUN 25* 21* 24* 27*  CREATININE 1.33* 1.17 1.27* 1.19  CALCIUM 9.4 9.3 8.9 8.8*  PROT 7.7  --   --   --   BILITOT 1.4*  --   --   --   ALKPHOS 80  --   --   --   ALT 16*  --   --   --   AST 27  --   --   --   GLUCOSE 111* 126* 137* 145*      Imaging/Diagnostic Tests: Dg Chest 2 View  Result Date: 05/03/2017 CLINICAL DATA:  Low-grade fever.  Recent pneumonia. EXAM: CHEST  2 VIEW COMPARISON:  04/13/2017 FINDINGS: Moderate cardiomegaly and chronic pulmonary vascular congestion are stable in appearance. No evidence of acute infiltrate  or pleural effusion. IMPRESSION: Stable cardiomegaly and chronic pulmonary vascular congestion. No acute findings . Electronically Signed   By: Earle Gell M.D.   On: 05/03/2017 18:45     Sherene Sires, DO 05/07/2017, 6:30 AM PGY-1, [place] Intern pager: [phone], text pages welcome  ------------------------------------------------------ FMTS Attending Daily Note:  Annabell Sabal MD  [phone] pager  Family Practice pager:  [phone] I have seen and examined this patient and have reviewed their chart. I have discussed this patient with the resident. I agree with the resident's findings, assessment and care plan.  Additionally:  - Patient still somewhat sore from PT session.  Overall feels well. - I'm not sure he'll qualify for inpt rehab as he has such difficulty standing/bearing weight.  Appreciate CIR seeing patient.  - Once dispo is cleared, he should be ready for discharge.    Alveda Reasons, MD 05/07/2017 3:30 PM

## 2017-05-07 NOTE — Progress Notes (Signed)
Patient CPAP setup and ready to go. Patient places himself on and off CPAP for sleep. Will call if any assistance needed

## 2017-05-07 NOTE — Progress Notes (Signed)
Physical Therapy Treatment Patient Details Name: Roberto [name] MRN: [id number] DOB: [birth year] Today's Date: 05/07/2017    History of Present Illness  Roberto [name] is a 56 y.o. male presenting with 5 days left leg pain found to have superficial thrombosis of the left greater saphenous vein coursing from the ankle through the mid thigh.  Positive for DVT left LE confirmed last admission as well.  PMH is significant for asthma/copd, chf, copd, t2dm, osa, dvt in left leg, afib, htn, obesity    PT Comments    Pt now c/o pain in his rt knee. Reports he slept with it in an awkward position last night. Unable to stand. If pain decreases expect he will make good progress.   Follow Up Recommendations  CIR;Supervision/Assistance - 24 hour     Equipment Recommendations  Rolling walker with 5" wheels;Wheelchair (measurements PT);Wheelchair cushion (measurements PT) (Bariatric equipment)    Recommendations for Other Services Rehab consult     Precautions / Restrictions Precautions Precautions: Fall Restrictions Weight Bearing Restrictions: No    Mobility  Bed Mobility Overal bed mobility: Needs Assistance Bed Mobility: Supine to Sit;Sit to Supine     Supine to sit: HOB elevated;+2 for physical assistance;Mod assist     General bed mobility comments: Assist to bring legs off of bed and to elevate trunk into sitting. Pt limited by rt knee pain  Transfers                 General transfer comment: Set up for standing readied but pt unable to bear weight on RLE due to pain in knee and unable to attempt standing  Ambulation/Gait             General Gait Details: Unable   Stairs            Wheelchair Mobility    Modified Rankin (Stroke Patients Only)       Balance Overall balance assessment: Needs assistance Sitting-balance support: No upper extremity supported;Feet supported Sitting balance-Leahy Scale: Good                                       Cognition Arousal/Alertness: Awake/alert Behavior During Therapy: WFL for tasks assessed/performed Overall Cognitive Status: Within Functional Limits for tasks assessed                                        Exercises      General Comments        Pertinent Vitals/Pain Pain Assessment: Faces Faces Pain Scale: Hurts even more Pain Location: rt knee Pain Descriptors / Indicators: Grimacing;Guarding;Sore Pain Intervention(s): Monitored during session;Limited activity within patient's tolerance;Repositioned    Home Living                      Prior Function            PT Goals (current goals can now be found in the care plan section) Progress towards PT goals: Not progressing toward goals - comment (now with painful rt knee)    Frequency    Min 3X/week      PT Plan Current plan remains appropriate    Co-evaluation              AM-PAC PT "6 Clicks" Daily Activity  Outcome Measure  Difficulty turning  over in bed (including adjusting bedclothes, sheets and blankets)?: Total Difficulty moving from lying on back to sitting on the side of the bed? : Total Difficulty sitting down on and standing up from a chair with arms (e.g., wheelchair, bedside commode, etc,.)?: Total Help needed moving to and from a bed to chair (including a wheelchair)?: Total Help needed walking in hospital room?: Total Help needed climbing 3-5 steps with a railing? : Total 6 Click Score: 6    End of Session Equipment Utilized During Treatment: Oxygen Activity Tolerance: Patient limited by pain Patient left: in bed;with call bell/phone within reach (sitting on EOB) Nurse Communication: Mobility status PT Visit Diagnosis: Unsteadiness on feet (R26.81);Muscle weakness (generalized) (M62.81);Difficulty in walking, not elsewhere classified (R26.2);Pain Pain - Right/Left: Right Pain - part of body: Knee     Time: 1136-1150 PT Time Calculation (min) (ACUTE  ONLY): 14 min  Charges:  $Therapeutic Activity: 8-22 mins                    G Codes:       [facility] PT [place] 05/07/2017, 4:26 PM

## 2017-05-07 NOTE — Care Management Important Message (Signed)
Important Message  Patient Details  Name: Roberto [name] MRN: [id number] Date of Birth: Jul 23, 1961   Medicare Important Message Given:  Yes    Swain Acree Abena 05/07/2017, 11:16 AM

## 2017-05-07 NOTE — Plan of Care (Signed)
Problem: Safety: Goal: Ability to remain free from injury will improve Outcome: Completed/Met Date Met: 05/07/17 Bed alarm utilized during sleeping hours, compliant with calling for assistance

## 2017-05-07 NOTE — Consult Note (Signed)
Physical Medicine and Rehabilitation Consult  Reason for Consult: Debility in setting of recent LLE DVT. Marland Kitchen  Referring Physician: Dr. Ardelia Mems    HPI: Roberto [name] is a 56 y.o. male with history of T2DM, HTN chronic systolic CHF, OHS with chronic dyspnea--on 5 L oxygen, A fib, morbid obesity,  recurrent DVTs  who was admitted on 05/04/17 with progressive LLE edema and pain with difficulty walking.  Patient with recent admission 7/21-7/25  for acute DVT left femoral vein and popliteal vein and discharged on Xarelto.  He was found to have new superficial thrombosis of left saphenous vein coursing from ankle through mid thigh. Case discussed with Dr. Bridgett Larsson who recommended coumadin for treatment. He was started on IV heparin and transitioned to coumadin. He has had issues with bradycardia during sleep, low grade fevers as well as hypoxia with increased oxygen needs. PT evaluation done revealing inability to WB on LLE and inability to walk. CIR recommended due to significant decline in mobility and ability to carry out ADL tasks.    Review of Systems  Constitutional: Negative for chills and fever.  HENT: Negative for hearing loss and tinnitus.   Eyes: Negative for blurred vision and double vision.  Respiratory: Negative for cough and shortness of breath.   Cardiovascular: Negative for chest pain and palpitations.  Gastrointestinal: Negative for constipation and heartburn.  Genitourinary: Negative for dysuria and urgency.  Musculoskeletal: Positive for joint pain (right knee). Negative for back pain and myalgias.  Skin: Negative for itching and rash.  Psychiatric/Behavioral: The patient does not have insomnia.   All other systems reviewed and are negative.     Past Medical History:  Diagnosis Date  . Asthma   . CHF (congestive heart failure) ([place])   . COPD (chronic obstructive pulmonary disease) ([place])   . Diabetes mellitus without complication ([place])   . Hypertension     Past  Surgical History:  Procedure Laterality Date  . SHOULDER SURGERY      Family History  Problem Relation Age of Onset  . Diabetes Mother   . Cancer Mother   . Diabetes Father   . High blood pressure Father   . Diabetes Brother       Social History:  Lives alone but has been staying with daughter since June to baby sit grandchildren while she attends works part time and attends school. Her fiancee has been helping out during the day. He reports that he has never smoked. He has never used smokeless tobacco. He reports that he does not drink alcohol or use drugs.    Allergies: No Known Allergies    Medications Prior to Admission  Medication Sig Dispense Refill  . acetaminophen (TYLENOL) 500 MG tablet Take 1,000 mg by mouth every 6 (six) hours as needed for moderate pain.    Marland Kitchen amLODipine (NORVASC) 5 MG tablet Take 5 mg by mouth daily.  1  . cholecalciferol (VITAMIN D) 1000 units tablet Take 1,000 Units by mouth daily.  1  . ferrous sulfate 325 (65 FE) MG tablet Take 325 mg by mouth daily.    Marland Kitchen glyBURIDE (DIABETA) 5 MG tablet Take 5 mg by mouth at bedtime as needed (high blood sugar).   0  . lisinopril (PRINIVIL,ZESTRIL) 10 MG tablet Take 1 tablet (10 mg total) by mouth daily. 30 tablet 11  . metFORMIN (GLUCOPHAGE) 1000 MG tablet Take 1,000 mg by mouth 2 (two) times daily.  1  . Multiple Vitamin (MULTIVITAMIN WITH MINERALS) TABS  tablet Take 1 tablet by mouth daily.    . OXYGEN Inhale 5 L into the lungs continuous.    . Rivaroxaban (XARELTO) 15 MG TABS tablet Take 1 tablet (15 mg total) by mouth 2 (two) times daily with a meal. 36 tablet 0  . torsemide (DEMADEX) 20 MG tablet Take 40 mg by mouth at bedtime.  5  . naproxen sodium (ALEVE) 220 MG tablet Take 220 mg by mouth every 12 (twelve) hours as needed (pain).    Marland Kitchen PRESCRIPTION MEDICATION Inhale into the lungs as needed (whenever sleeping - naps or at night). CPAP      Home: Home Living Family/patient expects to be discharged to::  Skilled nursing facility Living Arrangements: Children (Staying with daughter until he is better) Available Help at Discharge: Family, Available PRN/intermittently Type of Home: House Home Access: Stairs to enter Technical brewer of Steps: 7--landing-7 Entrance Stairs-Rails: Left Home Layout: One level Home Equipment: None  Functional History: Prior Function Level of Independence: Independent Functional Status:  Mobility: Bed Mobility Overal bed mobility: Needs Assistance Bed Mobility: Supine to Sit, Sit to Supine Supine to sit: Supervision, HOB elevated Sit to supine: Supervision, HOB elevated General bed mobility comments: Supervision for safety only Transfers Overall transfer level: Needs assistance Equipment used: Rolling walker (2 wheeled) Transfers: Sit to/from Stand Sit to Stand: Max assist, From elevated surface General transfer comment: Verbal cues for hand placement.  Patient able to reach upright standing position with great effort, and majority of weight on RLE.  Stood approx 10 seconds and had to return to sitting.  Patient unable to take any steps in stance. Ambulation/Gait General Gait Details: Unable    ADL: ADL Overall ADL's : Needs assistance/impaired Eating/Feeding: Independent, Sitting Grooming: Set up, Sitting Upper Body Bathing: Set up, Sitting Lower Body Bathing: Maximal assistance, Bed level Upper Body Dressing : Set up, Sitting Lower Body Dressing: Total assistance, Bed level Toilet Transfer Details (indicate cue type and reason): unable at this time with +1 A and raised bed  Cognition: Cognition Overall Cognitive Status: Within Functional Limits for tasks assessed Cognition Arousal/Alertness: Awake/alert Behavior During Therapy: WFL for tasks assessed/performed Overall Cognitive Status: Within Functional Limits for tasks assessed   Blood pressure (!) 145/82, pulse 87, temperature 98.6 F (37 C), temperature source Oral, resp. rate 18,  height 6' (1.829 m), weight (!) 163.3 kg (360 lb), SpO2 97 %. Physical Exam  Nursing note and vitals reviewed. Constitutional: He is oriented to person, place, and time. He appears well-developed. Nasal cannula in place.  Morbidly obese male  HENT:  Head: Normocephalic and atraumatic.  Mouth/Throat: Oropharynx is clear and moist.  Eyes: Pupils are equal, round, and reactive to light. Conjunctivae and EOM are normal.  Neck: Normal range of motion. Neck supple.  Cardiovascular: Normal rate and regular rhythm.   Respiratory: Effort normal. No stridor. No respiratory distress. He has decreased breath sounds.  +[place]  GI: Soft. Bowel sounds are normal. He exhibits no distension. There is no tenderness.  Musculoskeletal: He exhibits edema and tenderness (Right knee).  Moderate edema right knee with tenderness. 2+ edema right calf --support stocking in place. 1+ edema with stasis changes LLE.   Neurological: He is alert and oriented to person, place, and time.  Speech clear.  Able to follow commands without difficulty.  Motor: B/l UE 5/5 proximal to distal RLE: HF, KE 2/5, ADF/PF 4+/5 (pain inhibition) LLE: HF, KE 3/5, ADF/PF 4+/5 (pain inhibition) Sensation intact to light touch  Skin: Skin is  warm and dry.  Vasc changes to LE  Psychiatric: He has a normal mood and affect. His speech is normal and behavior is normal. Thought content normal.    Results for orders placed or performed during the hospital encounter of 05/03/17 (from the past 24 hour(s))  Glucose, capillary     Status: Abnormal   Collection Time: 05/06/17  5:04 PM  Result Value Ref Range   Glucose-Capillary 143 (H) 65 - 99 mg/dL  Glucose, capillary     Status: Abnormal   Collection Time: 05/06/17  8:27 PM  Result Value Ref Range   Glucose-Capillary 157 (H) 65 - 99 mg/dL  Protime-INR     Status: Abnormal   Collection Time: 05/07/17  2:11 AM  Result Value Ref Range   Prothrombin Time 15.5 (H) 11.4 - 15.2 seconds   INR 1.22     CBC     Status: None   Collection Time: 05/07/17  2:11 AM  Result Value Ref Range   WBC 10.2 4.0 - 10.5 K/uL   RBC 4.79 4.22 - 5.81 MIL/uL   Hemoglobin 13.7 13.0 - 17.0 g/dL   HCT 41.7 39.0 - 52.0 %   MCV 87.1 78.0 - 100.0 fL   MCH 28.6 26.0 - 34.0 pg   MCHC 32.9 30.0 - 36.0 g/dL   RDW 14.6 11.5 - 15.5 %   Platelets 301 150 - 400 K/uL  Basic metabolic panel (BMP)     Status: Abnormal   Collection Time: 05/07/17  2:11 AM  Result Value Ref Range   Sodium 136 135 - 145 mmol/L   Potassium 3.6 3.5 - 5.1 mmol/L   Chloride 100 (L) 101 - 111 mmol/L   CO2 27 22 - 32 mmol/L   Glucose, Bld 145 (H) 65 - 99 mg/dL   BUN 27 (H) 6 - 20 mg/dL   Creatinine, Ser 1.19 0.61 - 1.24 mg/dL   Calcium 8.8 (L) 8.9 - 10.3 mg/dL   GFR calc non Af Amer >60 >60 mL/min   GFR calc Af Amer >60 >60 mL/min   Anion gap 9 5 - 15  Glucose, capillary     Status: Abnormal   Collection Time: 05/07/17  7:43 AM  Result Value Ref Range   Glucose-Capillary 136 (H) 65 - 99 mg/dL   No results found.  Assessment/Plan: Diagnosis: Debility  Labs and images independently reviewed.  Records reviewed and summated above.  1. Does the need for close, 24 hr/day medical supervision in concert with the patient's rehab needs make it unreasonable for this patient to be served in a less intensive setting? Yes Co-Morbidities requiring supervision/potential complications: T2 DM (Monitor in accordance with exercise and adjust meds as necessary), HTN (monitor and provide prns in accordance with increased physical exertion and pain), chronic systolic CHF (Monitor in accordance with increased physical activity and avoid UE resistance excercises), OHS with chronic dyspnea (monitor O2 sats and RR with increased physical activity), A fib (monitor HR with increased exertion), morbid obesity (Body mass index is 48.82 kg/m., diet and exercise education, encourage weight loss to increase endurance and promote overall health), recurrent DVTs (cont  anticoagulation), bradycardia during sleep, low grade fevers, hypoxia  2. Due to bladder management, safety, skin/wound care, disease management, pain management and patient education, does the patient require 24 hr/day rehab nursing? Yes 3. Does the patient require coordinated care of a physician, rehab nurse, PT (1-2 hrs/day, 5 days/week) and OT (1-2 hrs/day, 5 days/week) to address physical and functional deficits in  the context of the above medical diagnosis(es)? Yes Addressing deficits in the following areas: balance, endurance, locomotion, strength, transferring, bowel/bladder control, bathing, dressing, toileting and psychosocial support 4. Can the patient actively participate in an intensive therapy program of at least 3 hrs of therapy per day at least 5 days per week? Potentially 5. The potential for patient to make measurable gains while on inpatient rehab is excellent 6. Anticipated functional outcomes upon discharge from inpatient rehab are min assist  with PT, modified independent with OT, n/a with SLP. 7. Estimated rehab length of stay to reach the above functional goals is: 15-19 days. 8. Anticipated D/C setting: Other 9. Anticipated post D/C treatments: HH therapy and Home excercise program 10. Overall Rehab/Functional Prognosis: good  RECOMMENDATIONS: This patient's condition is appropriate for continued rehabilitative care in the following setting: CIR when pain better controlled and patient ablt toerlate 3 hous of therapy/day Patient has agreed to participate in recommended program. Potentially Note that insurance prior authorization may be required for reimbursement for recommended care.  Comment: Rehab Admissions Coordinator to follow up.  Delice Lesch, MD, [address] [place] 05/07/2017

## 2017-05-07 NOTE — Progress Notes (Signed)
[facility] for Lovenox/Warfarin Indication: DVT/Afib  No Known Allergies  Patient Measurements: Height: 6' (182.9 cm) Weight: (!) 360 lb (163.3 kg) IBW/kg (Calculated) : 77.6  Vital Signs: Temp: 98.6 F (37 C) (08/13 0513) Temp Source: Oral (08/13 0513) BP: 145/82 (08/13 0513) Pulse Rate: 70 (08/13 0513)  Labs:  Recent Labs  05/04/17 1256 05/04/17 2153  05/05/17 0533 05/06/17 0301 05/07/17 0211  HGB  --   --   < > 14.2 13.7 13.7  HCT  --   --   --  44.2 42.8 41.7  PLT  --   --   --  295 307 301  APTT 70* 58*  --   --   --   --   LABPROT  --   --   --  15.2 15.6* 15.5*  INR  --   --   --  1.19 1.23 1.22  CREATININE  --   --   --  1.17 1.27* 1.19  < > = values in this interval not displayed.  Estimated Creatinine Clearance: 109.7 mL/min (by C-G formula based on SCr of 1.19 mg/dL).   Medical History: Past Medical History:  Diagnosis Date  . Asthma   . CHF (congestive heart failure) ([place])   . COPD (chronic obstructive pulmonary disease) ([place])   . Diabetes mellitus without complication ([place])   . Hypertension     Medications:  Prescriptions Prior to Admission  Medication Sig Dispense Refill Last Dose  . acetaminophen (TYLENOL) 500 MG tablet Take 1,000 mg by mouth every 6 (six) hours as needed for moderate pain.   05/03/2017 at Unknown time  . amLODipine (NORVASC) 5 MG tablet Take 5 mg by mouth daily.  1 05/03/2017 at Unknown time  . cholecalciferol (VITAMIN D) 1000 units tablet Take 1,000 Units by mouth daily.  1 05/03/2017 at Unknown time  . ferrous sulfate 325 (65 FE) MG tablet Take 325 mg by mouth daily.   05/03/2017 at Unknown time  . glyBURIDE (DIABETA) 5 MG tablet Take 5 mg by mouth at bedtime as needed (high blood sugar).   0 Past Month at Unknown time  . lisinopril (PRINIVIL,ZESTRIL) 10 MG tablet Take 1 tablet (10 mg total) by mouth daily. 30 tablet 11 05/03/2017 at Unknown time  . metFORMIN (GLUCOPHAGE) 1000 MG tablet Take 1,000  mg by mouth 2 (two) times daily.  1 05/03/2017 at Unknown time  . Multiple Vitamin (MULTIVITAMIN WITH MINERALS) TABS tablet Take 1 tablet by mouth daily.   05/03/2017 at Unknown time  . OXYGEN Inhale 5 L into the lungs continuous.   05/03/2017 at ongoing  . Rivaroxaban (XARELTO) 15 MG TABS tablet Take 1 tablet (15 mg total) by mouth 2 (two) times daily with a meal. 36 tablet 0 05/03/2017 at 1100  . torsemide (DEMADEX) 20 MG tablet Take 40 mg by mouth at bedtime.  5 05/02/2017 at Unknown time  . naproxen sodium (ALEVE) 220 MG tablet Take 220 mg by mouth every 12 (twelve) hours as needed (pain).   unknown  . PRESCRIPTION MEDICATION Inhale into the lungs as needed (whenever sleeping - naps or at night). CPAP   unknown   Scheduled:  . amLODipine  5 mg Oral Daily  . coumadin book   Does not apply Once  . enoxaparin (LOVENOX) injection  165 mg Subcutaneous Q12H  . ferrous sulfate  325 mg Oral Daily  . fluticasone  1 spray Each Nare Daily  . insulin aspart  0-9  Units Subcutaneous TID WC  . ipratropium-albuterol  3 mL Nebulization BID  . lisinopril  10 mg Oral Daily  . pantoprazole  40 mg Oral Daily  . torsemide  40 mg Oral QHS  . warfarin   Does not apply Once  . Warfarin - Pharmacist Dosing Inpatient   Does not apply q1800    Assessment: 56yo male with new DVT while on Xarelto for Afib, was on heparin bridge now on Lovenox bridge to Coumadin (day 4 overlap -INR= 1.22 (coumadin increased on 8/12)  Goal of Therapy:  INR 2-3  Plan:  - Warfarin 15 mg po x 1  - Continue Lovenox 165 mg SQ q12 - Daily INR - CBC q72h  Hildred Laser, Pharm D 05/07/2017 8:48 AM

## 2017-05-07 NOTE — NC FL2 (Signed)
[place] MEDICAID FL2 LEVEL OF CARE SCREENING TOOL     IDENTIFICATION  Patient Name: Roberto [name] Birthdate: 1961-06-20 Sex: male Admission Date (Current Location): 05/03/2017  [facility] and [place] Number:  Herbalist and Address:  [facility], [place] [address] [place], [place] [address]      Provider Number: [id number]  Attending Physician Name and Address:  McDiarmid, Blane Ohara, MD  Relative Name and Phone Number:  Darolyn Rua, Mother, [phone]     Current Level of Care: Hospital Recommended Level of Care: [place] Prior Approval Number:    Date Approved/Denied:   PASRR Number: [id number]  Discharge Plan: SNF    Current Diagnoses: Patient Active Problem List   Diagnosis Date Noted  . Acute deep vein thrombosis (DVT) of femoral vein of left lower extremity ([place])   . Essential hypertension   . Chronic systolic congestive heart failure ([place])   . Morbid obesity ([place])   . Dyspnea   . PAF (paroxysmal atrial fibrillation) ([place])   . Bradycardia   . Saphenous vein clot, left 05/04/2017  . Cor pulmonale, chronic ([place]) 05/04/2017  . Type 2 diabetes mellitus with diabetic polyneuropathy, without long-term current use of insulin ([place])   . Chronic diastolic heart failure ([place])   . Saphenous vein clot 05/03/2017  . Chronic respiratory failure with hypoxia ([place])   . Acute deep vein thrombosis (DVT) of proximal vein of left lower extremity ([place])   . SOB (shortness of breath) 04/14/2017  . Acute on chronic congestive heart failure ([place])   . Left leg pain   . Shortness of breath   . Atrial fibrillation ([place])   . Community acquired pneumonia     Orientation RESPIRATION BLADDER Height & Weight     Self, Time, Situation, Place  O2 (nasal cannula 6L) Continent Weight: (!) 360 lb (163.3 kg) Height:  6' (182.9 cm)  BEHAVIORAL SYMPTOMS/MOOD NEUROLOGICAL BOWEL NUTRITION STATUS      Continent Diet (heart healthy, carb modified;  please see DC summary)  AMBULATORY STATUS COMMUNICATION OF NEEDS Skin   Extensive Assist Verbally Normal, Other (Comment) (discoloration lower extremities)                       Personal Care Assistance Level of Assistance  Bathing, Feeding, Dressing Bathing Assistance: Maximum assistance Feeding assistance: Independent Dressing Assistance: Maximum assistance     Functional Limitations Info  Sight, Hearing, Speech Sight Info: Adequate Hearing Info: Adequate Speech Info: Adequate    SPECIAL CARE FACTORS FREQUENCY  PT (By licensed PT), OT (By licensed OT)     PT Frequency: 5x/week OT Frequency: 5x/week            Contractures Contractures Info: Not present    Additional Factors Info  Code Status, Allergies, Insulin Sliding Scale Code Status Info: Full Allergies Info: NKA   Insulin Sliding Scale Info: insulin 3x/day with meals       Current Medications (05/07/2017):  This is the current hospital active medication list Current Facility-Administered Medications  Medication Dose Route Frequency Provider Last Rate Last Dose  . acetaminophen (TYLENOL) tablet 500 mg  500 mg Oral Q6H Everrett Coombe, MD      . albuterol (PROVENTIL) (2.5 MG/3ML) 0.083% nebulizer solution 2.5 mg  2.5 mg Nebulization Q2H PRN McDaniels, Linda K   2.5 mg at 05/05/17 0528  . amLODipine (NORVASC) tablet 5 mg  5 mg Oral Daily Rogue Bussing, MD   5  mg at 05/07/17 0838  . coumadin book   Does not apply Once Laren Everts, RPH      . enoxaparin (LOVENOX) injection 165 mg  165 mg Subcutaneous Q12H Laren Everts, RPH   165 mg at 05/07/17 1239  . ferrous sulfate tablet 325 mg  325 mg Oral Daily Rogue Bussing, MD   325 mg at 05/07/17 6825  . fluticasone (FLONASE) 50 MCG/ACT nasal spray 1 spray  1 spray Each Nare Daily Bland, Scott, DO   1 spray at 05/07/17 7493  . insulin aspart (novoLOG) injection 0-9 Units  0-9 Units Subcutaneous TID WC Rogue Bussing, MD   1 Units at  05/07/17 1239  . ipratropium-albuterol (DUONEB) 0.5-2.5 (3) MG/3ML nebulizer solution 3 mL  3 mL Nebulization BID McDiarmid, Blane Ohara, MD   3 mL at 05/07/17 0901  . lisinopril (PRINIVIL,ZESTRIL) tablet 10 mg  10 mg Oral Daily Rogue Bussing, MD   10 mg at 05/07/17 [phone]  . ondansetron (ZOFRAN) tablet 4 mg  4 mg Oral Q6H PRN Rogue Bussing, MD       Or  . ondansetron [facility]) injection 4 mg  4 mg Intravenous Q6H PRN Rogue Bussing, MD      . oxyCODONE-acetaminophen (PERCOCET/ROXICET) 5-325 MG per tablet 1 tablet  1 tablet Oral Q6H PRN McDiarmid, Blane Ohara, MD   1 tablet at 05/07/17 [phone]  . pantoprazole (PROTONIX) EC tablet 40 mg  40 mg Oral Daily Bland, Scott, DO   40 mg at 05/07/17 5953  . polyethylene glycol (MIRALAX / GLYCOLAX) packet 17 g  17 g Oral Daily PRN Rogue Bussing, MD      . torsemide [facility]) tablet 40 mg  40 mg Oral QHS Rogue Bussing, MD   40 mg at 05/06/17 2300  . traMADol (ULTRAM) tablet 50 mg  50 mg Oral Q6H PRN Rogue Bussing, MD   50 mg at 05/07/17 0309  . warfarin (COUMADIN) tablet 15 mg  15 mg Oral ONCE-1800 Kris Mouton, RPH      . warfarin (COUMADIN) video   Does not apply Once Laren Everts, RPH      . Warfarin - Pharmacist Dosing Inpatient   Does not apply q1800 Laren Everts, [facility]         Discharge Medications: Please see discharge summary for a list of discharge medications.  Relevant Imaging Results:  Relevant Lab Results:   Additional Information SSN: [id number]  Estanislado Emms, LCSW

## 2017-05-08 ENCOUNTER — Inpatient Hospital Stay (HOSPITAL_COMMUNITY): Payer: Medicare Other

## 2017-05-08 LAB — CBC
HEMATOCRIT: 43.6 % (ref 39.0–52.0)
HEMOGLOBIN: 13.8 g/dL (ref 13.0–17.0)
MCH: 27.8 pg (ref 26.0–34.0)
MCHC: 31.7 g/dL (ref 30.0–36.0)
MCV: 87.7 fL (ref 78.0–100.0)
Platelets: 302 10*3/uL (ref 150–400)
RBC: 4.97 MIL/uL (ref 4.22–5.81)
RDW: 14.7 % (ref 11.5–15.5)
WBC: 9.6 10*3/uL (ref 4.0–10.5)

## 2017-05-08 LAB — BASIC METABOLIC PANEL
ANION GAP: 10 (ref 5–15)
BUN: 27 mg/dL — ABNORMAL HIGH (ref 6–20)
CALCIUM: 9.1 mg/dL (ref 8.9–10.3)
CO2: 30 mmol/L (ref 22–32)
Chloride: 100 mmol/L — ABNORMAL LOW (ref 101–111)
Creatinine, Ser: 1.25 mg/dL — ABNORMAL HIGH (ref 0.61–1.24)
Glucose, Bld: 145 mg/dL — ABNORMAL HIGH (ref 65–99)
POTASSIUM: 3.8 mmol/L (ref 3.5–5.1)
Sodium: 140 mmol/L (ref 135–145)

## 2017-05-08 LAB — GLUCOSE, CAPILLARY
GLUCOSE-CAPILLARY: 122 mg/dL — AB (ref 65–99)
GLUCOSE-CAPILLARY: 156 mg/dL — AB (ref 65–99)
GLUCOSE-CAPILLARY: 167 mg/dL — AB (ref 65–99)
Glucose-Capillary: 143 mg/dL — ABNORMAL HIGH (ref 65–99)

## 2017-05-08 LAB — PROTIME-INR
INR: 1.41
PROTHROMBIN TIME: 17.4 s — AB (ref 11.4–15.2)

## 2017-05-08 MED ORDER — WARFARIN SODIUM 7.5 MG PO TABS
17.5000 mg | ORAL_TABLET | Freq: Once | ORAL | Status: AC
  Administered 2017-05-08: 17.5 mg via ORAL
  Filled 2017-05-08: qty 1

## 2017-05-08 NOTE — Progress Notes (Signed)
CIR informed CSW patient not appropriate for CIR at this time. CSW met with patient at bedside an obtained patient choice for SNF. Patient indicated he and his daughter reviewed the list of SNF choices and patient chooses [facility]. CSW called Monica Becton at [facility] to order air mattress for patient. SNF will have air mattress for patient tomorrow, 8/15. CSW will support with discharge.  Estanislado Emms, [place]

## 2017-05-08 NOTE — Plan of Care (Signed)
Problem: Activity: Goal: Risk for activity intolerance will decrease Outcome: Progressing PT/OT consult and treat order

## 2017-05-08 NOTE — Progress Notes (Signed)
RT set up CPAP for patient. Patient is able to place himself on CPAP when he is ready. NO assistance needed.

## 2017-05-08 NOTE — Progress Notes (Signed)
[facility] for Lovenox/Warfarin Indication: DVT/Afib  No Known Allergies  Patient Measurements: Height: 6' (182.9 cm) Weight: (!) 360 lb (163.3 kg) IBW/kg (Calculated) : 77.6  Vital Signs: Temp: 100.9 F (38.3 C) (08/14 0459) Temp Source: Oral (08/14 0459) BP: 117/68 (08/14 0459) Pulse Rate: 78 (08/14 0459)  Labs:  Recent Labs  05/06/17 0301 05/07/17 0211 05/08/17 0235  HGB 13.7 13.7 13.8  HCT 42.8 41.7 43.6  PLT 307 301 302  LABPROT 15.6* 15.5* 17.4*  INR 1.23 1.22 1.41  CREATININE 1.27* 1.19 1.25*    Estimated Creatinine Clearance: 104.4 mL/min (A) (by C-G formula based on SCr of 1.25 mg/dL (H)).   Medical History: Past Medical History:  Diagnosis Date  . Asthma   . CHF (congestive heart failure) ([place])   . COPD (chronic obstructive pulmonary disease) ([place])   . Diabetes mellitus without complication ([place])   . Hypertension     Medications:  Prescriptions Prior to Admission  Medication Sig Dispense Refill Last Dose  . acetaminophen (TYLENOL) 500 MG tablet Take 1,000 mg by mouth every 6 (six) hours as needed for moderate pain.   05/03/2017 at Unknown time  . amLODipine (NORVASC) 5 MG tablet Take 5 mg by mouth daily.  1 05/03/2017 at Unknown time  . cholecalciferol (VITAMIN D) 1000 units tablet Take 1,000 Units by mouth daily.  1 05/03/2017 at Unknown time  . ferrous sulfate 325 (65 FE) MG tablet Take 325 mg by mouth daily.   05/03/2017 at Unknown time  . glyBURIDE (DIABETA) 5 MG tablet Take 5 mg by mouth at bedtime as needed (high blood sugar).   0 Past Month at Unknown time  . lisinopril (PRINIVIL,ZESTRIL) 10 MG tablet Take 1 tablet (10 mg total) by mouth daily. 30 tablet 11 05/03/2017 at Unknown time  . metFORMIN (GLUCOPHAGE) 1000 MG tablet Take 1,000 mg by mouth 2 (two) times daily.  1 05/03/2017 at Unknown time  . Multiple Vitamin (MULTIVITAMIN WITH MINERALS) TABS tablet Take 1 tablet by mouth daily.   05/03/2017 at Unknown time  .  OXYGEN Inhale 5 L into the lungs continuous.   05/03/2017 at ongoing  . Rivaroxaban (XARELTO) 15 MG TABS tablet Take 1 tablet (15 mg total) by mouth 2 (two) times daily with a meal. 36 tablet 0 05/03/2017 at 1100  . torsemide (DEMADEX) 20 MG tablet Take 40 mg by mouth at bedtime.  5 05/02/2017 at Unknown time  . naproxen sodium (ALEVE) 220 MG tablet Take 220 mg by mouth every 12 (twelve) hours as needed (pain).   unknown  . PRESCRIPTION MEDICATION Inhale into the lungs as needed (whenever sleeping - naps or at night). CPAP   unknown   Scheduled:  . acetaminophen  500 mg Oral Q6H  . amLODipine  5 mg Oral Daily  . coumadin book   Does not apply Once  . enoxaparin (LOVENOX) injection  165 mg Subcutaneous Q12H  . ferrous sulfate  325 mg Oral Daily  . fluticasone  1 spray Each Nare Daily  . insulin aspart  0-9 Units Subcutaneous TID WC  . lisinopril  10 mg Oral Daily  . pantoprazole  40 mg Oral Daily  . torsemide  40 mg Oral QHS  . warfarin   Does not apply Once  . Warfarin - Pharmacist Dosing Inpatient   Does not apply q1800    Assessment: 56yo male with new DVT while on Xarelto for Afib, was on heparin bridge now on Lovenox bridge to  Coumadin (day 5 overlap -INR= 1.41 (slow trend up; coumadin increased on 8/12)  Goal of Therapy:  INR 2-3  Plan:  - Increase coumadin to 17.43m po x1 - Continue Lovenox 165 mg SQ q12 - Daily INR - CBC q72h  AHildred Laser Pharm D 05/08/2017 11:50 AM

## 2017-05-08 NOTE — Plan of Care (Signed)
Problem: Tissue Perfusion: Goal: Risk factors for ineffective tissue perfusion will decrease Patient desating while sleeping, 02 in the 80's. Respiratory notified several times to adjust CPAP pressure and oxygen settings, patient refused. Will continue to educate and monitor.  Problem: Bowel/Gastric: Goal: Will not experience complications related to bowel motility Patient has not had bowel movement since 05/03/17, and refuses laxatives or stool softners. Will continue to encourage patient

## 2017-05-08 NOTE — Progress Notes (Signed)
Family Medicine Teaching Service Daily Progress Note Intern Pager: [phone]  Patient name: Ellis Mehaffey Medical record number: [id number] Date of birth: January 08, 1961 Age: 56 y.o. Gender: male  Primary Care Provider: Sherene Sires, DO Consultants: SW Code Status: FULL  Pt Overview and Major Events to Date:  Koury Roddy a 56 y.o.malepresenting with 5 days left leg pain shown by [place] to be saphenous vein clot. PMH is significant for recurrent DVT, Afib, CHF (EF 60-65% with severe concentric hypertrophy), OSA on CPAP, HTN. Overnight on 8/9-8/10 he had bradycardia intermittently into the 20s-30s when trying to sleep without his CPAP machine but that appears to be machine error due to afib and patient was consistently asymptomatic and ECG 8/11 was consistent with afib.  Assessment and Plan: Delando Satter a 56 y.o.malepresenting with 5 days left leg pain. PMH is significant for recurrent DVT, Afib, CHF (EF 60-65% with severe concentric hypertrophy), OSA on CPAP, HTN.  L leg pain with thrombosis of L greater saphenous vein: Had Darkening of LLE and asymmetric swelling compared to R. Had acute DVT of left femoral vein and left popliteal vein during last hospitalization and repeat LE doppler in ED showed no propagation but superficial thrombosis of L greater saphenous vein from ankle to mid thigh.Currenlty bridging lovenox to warfarin.  -on telemetry -vascular consulted, recommended transition tocoumadin -coumadin per pharmacy -lovenox bridge (5days (2 of which must be therapuetic range) starting evening 8/10) -routine pulse/sensation checks  -tramadol 50 mg q6h prn and tylenol 527m q6h prn for pain control -PT/OT eval- recommended CIR -Patient does not think he can tolerate 3hr/day PT, will have SW pursue snf -Follow INR (1.41)  Chronic Dyspnea: Improved since last hospitalizationper patient.  - Continue hometorsemide 40 mg QHS - Strict I/O, monitor daily weights - Continue  cardiac monitor - Continue home oxygen (5L)  -flonase for nasal decongestion  CHF: Hometorsemide. On home 5L for chronic respiratory failure and maintaining appropriate O2 sats. Echo performed 7/22 which revealed 60-65% EF with pulm hypertension. - Continue hometorsemide - Strict I/O, monitor daily weights - Continue home lisinopril at 10 mg (decreased during last admission) - Continue cardiac monitor - Continue home oxygen (5L)  Atrial fibrillation w/o RVR: chad-vasc score 6. Not on rate or rhythm control. Previously on Xarelto.Denies history of GI bleed. - coumadin per pharmacy/lovenox bridge started 8/10 - cardiac monitor - Monitor vitals  AKI/?CKD: Unsure of his baseline (~1.2?). Cr 1.25 today.  -Daily BMP -avoid NSAIDs -monitor weights  T2DM: CBGs stable. Last A1c 7.3 on 04/14/17. Controlled on metformin and glyburide at home.  - sensitive SSI insulin and qachs CBGs   Asthma/COPD: No prior formal diagnosis. Never smoker.On home 5L [place].  -PRN dounebs -would refer to Pulmonology outpatient, as also had pulmonary nodule noted during last hospitalization that will require monitoring and patient suspects CPAP machine will need replacing soon  Hypertension: Stable.  -Continuehome lisinopril and amlodipine  OSA/?OHS -Nightly CPAP  Lung nodule:7 mm solid-appearing nodule noted in the right lung on CTA. Follow up CT at 18-24 months recommended.   FEN/GI:heart healthy/carb modified diet  Prophylaxis: on full anticoagulation  Disposition: CIR wants pain free,  place consult for SNF  Subjective:  Pain in R knee is consistent and patient is adamant it is muscular from PT session.   He does not think he can do 3hr/day in CIR and agrees to SNF placement.   Left leg where clot was is improving and he says it feels much better  Objective: Temp:  [98.1  F (36.7 C)-100.9 F (38.3 C)] 100.9 F (38.3 C) (08/14 0459) Pulse Rate:  [74-87] 78 (08/14 0459) Resp:   [10-27] 16 (08/14 0459) BP: (117-148)/(63-82) 117/68 (08/14 0459) SpO2:  [90 %-97 %] 96 % (08/14 0459) Physical Exam: General: lying in bed,alert and conversational, obese, NAD but breathing is slightly labored at baseline Cardiovascular: irregularly irregular,no murmur appreciated; DP pulses are now symmetrical  Respiratory: satting mid 90s on 5L nasal canula(home dose),appears to have slightly increased WOB, no wheezes noted Gastrointestinal: soft belly with no tendernesss to deep palpation, bowel sounds in 4 quadrants MSK: improving asymmetrical swelling/discoloration in the left lower leg. No pitting edema of LLE. Chronic venous stasis changes.  New onset R knee pain after PT yesterday, patient thinks he worked too hard Derm: skin changes including color to deep purple/black/red, texture to thick on left lower leg.  Neuro: slightly decreasedsensation on L lower leg Psych: appropriate and pleasant  Laboratory:  Recent Labs Lab 05/06/17 0301 05/07/17 0211 05/08/17 0235  WBC 8.9 10.2 9.6  HGB 13.7 13.7 13.8  HCT 42.8 41.7 43.6  PLT 307 301 302    Recent Labs Lab 05/03/17 1730  05/06/17 0301 05/07/17 0211 05/08/17 0235  NA 141  < > 139 136 140  K 4.1  < > 3.9 3.6 3.8  CL 101  < > 100* 100* 100*  CO2 30  < > _0 BUN 25*  < > 24* 27* 27*  CREATININE 1.33*  < > 1.27* 1.19 1.25*  CALCIUM 9.4  < > 8.9 8.8* 9.1  PROT 7.7  --   --   --   --   BILITOT 1.4*  --   --   --   --   ALKPHOS 80  --   --   --   --   ALT 16*  --   --   --   --   AST 27  --   --   --   --   GLUCOSE 111*  < > 137* 145* 145*  < > = values in this interval not displayed.    Imaging/Diagnostic Tests: Dg Chest 2 View  Result Date: 05/03/2017 CLINICAL DATA:  Low-grade fever.  Recent pneumonia. EXAM: CHEST  2 VIEW COMPARISON:  04/13/2017 FINDINGS: Moderate cardiomegaly and chronic pulmonary vascular congestion are stable in appearance. No evidence of acute infiltrate or pleural effusion.  IMPRESSION: Stable cardiomegaly and chronic pulmonary vascular congestion. No acute findings . Electronically Signed   By: Earle Gell M.D.   On: 05/03/2017 18:45     Sherene Sires, DO 05/08/2017, 6:29 AM PGY-1, [place] Intern pager: [phone], text pages welcome

## 2017-05-08 NOTE — Progress Notes (Signed)
Pt not at a level to tolerate intense inpt rehab at this time. I contacted SW to pursue SNF at this time. We will sign off. [phone]

## 2017-05-08 NOTE — Progress Notes (Signed)
CSW met with patient at bedside and gave patient SNF bed offers. CSW coordinating with CIR, as CIR also following. CSW will continue to follow and support with disposition planning.  Estanislado Emms, [place]

## 2017-05-08 NOTE — Progress Notes (Signed)
Physical Therapy Treatment Patient Details Name: Roberto [name] MRN: [id number] DOB: 19-Mar-1961 Today's Date: 05/08/2017    History of Present Illness  Roberto [name] is a 56 y.o. male presenting with 5 days left leg pain found to have superficial thrombosis of the left greater saphenous vein coursing from the ankle through the mid thigh.  Positive for DVT left LE confirmed last admission as well.  PMH is significant for asthma/copd, chf, copd, t2dm, osa, dvt in left leg, afib, htn, obesity    PT Comments    Patient continues to be limited by right knee pain. Not able to get full knee extension passively due to pain and right knee extensors ~2-/5 for MMT. Pt seems fearful and anxious to place weight through RLE. Attempted to stand x4 but only able to clear bottom minimally. Encouraged exercises in bed. Will attempt using shoes and back of chair to assist next session. Pt not interested in intensity of CIR so disposition changed to SNF. Will follow and progress as tolerated.    Follow Up Recommendations  SNF;Supervision for mobility/OOB     Equipment Recommendations  Rolling walker with 5" wheels;Wheelchair (measurements PT);Wheelchair cushion (measurements PT)    Recommendations for Other Services       Precautions / Restrictions Precautions Precautions: Fall Restrictions Weight Bearing Restrictions: No    Mobility  Bed Mobility Overal bed mobility: Needs Assistance Bed Mobility: Supine to Sit     Supine to sit: Min guard;HOB elevated     General bed mobility comments: Able to bring LEs off bed and elevate trunk without assist. Increased time. Limited by right knee pain.  Transfers Overall transfer level: Needs assistance Equipment used: Rolling walker (2 wheeled) Transfers: Sit to/from Stand           General transfer comment: Set up for standing attempt but pt unable/unwilling to bear weight through RLE due to pain in knee. Attempted x4 but only able to minimally  clear bottom. Seems fearful as well.  Ambulation/Gait                 Stairs            Wheelchair Mobility    Modified Rankin (Stroke Patients Only)       Balance Overall balance assessment: Needs assistance Sitting-balance support: Bilateral upper extremity supported;Feet supported Sitting balance-Leahy Scale: Fair Sitting balance - Comments: Requires UE support sitting EOB.       Standing balance comment: unable to stand with bed raised                            Cognition Arousal/Alertness: Awake/alert Behavior During Therapy: [facility] for tasks assessed/performed;Anxious Overall Cognitive Status: Within Functional Limits for tasks assessed                                 General Comments: Seems anxious and fearful of placing weight through RLE.      Exercises General Exercises - Lower Extremity Ankle Circles/Pumps: Both;15 reps;Seated Long Arc Quad: Left;10 reps;Seated;AAROM;5 reps Hip Flexion/Marching: Both;10 reps;Seated Heel Raises: Both;10 reps;Seated Other Exercises Other Exercises: Stretching of right hamstrings 30 sec x2, not able to get full knee extension due to pain. Other Exercises: Palpable tightness present posterior right knee.    General Comments General comments (skin integrity, edema, etc.): Sp02 ranging in high 80s on supplemental 02.      Pertinent Vitals/Pain  Pain Assessment: 0-10 Pain Score: 3  Pain Location: rt knee Pain Descriptors / Indicators: Grimacing;Guarding;Sore Pain Intervention(s): Monitored during session;Repositioned;Limited activity within patient's tolerance;Ice applied    Home Living                      Prior Function            PT Goals (current goals can now be found in the care plan section) Progress towards PT goals: Not progressing toward goals - comment (painful right knee)    Frequency    Min 3X/week      PT Plan Discharge plan needs to be updated     Co-evaluation              AM-PAC PT "6 Clicks" Daily Activity  Outcome Measure  Difficulty turning over in bed (including adjusting bedclothes, sheets and blankets)?: None Difficulty moving from lying on back to sitting on the side of the bed? : None Difficulty sitting down on and standing up from a chair with arms (e.g., wheelchair, bedside commode, etc,.)?: Total Help needed moving to and from a bed to chair (including a wheelchair)?: Total Help needed walking in hospital room?: Total Help needed climbing 3-5 steps with a railing? : Total 6 Click Score: 12    End of Session Equipment Utilized During Treatment: Oxygen Activity Tolerance: Patient limited by pain Patient left: in bed;with call bell/phone within reach (sitting EOB- deflated) Nurse Communication: Mobility status PT Visit Diagnosis: Unsteadiness on feet (R26.81);Muscle weakness (generalized) (M62.81);Difficulty in walking, not elsewhere classified (R26.2);Pain Pain - Right/Left: Right Pain - part of body: Knee     Time: 1355-1426 PT Time Calculation (min) (ACUTE ONLY): 31 min  Charges:  $Therapeutic Exercise: 8-22 mins $Therapeutic Activity: 8-22 mins                    G Codes:       Roberto [name], PT, DPT [phone]     Marguarite Arbour A Fusaye Wachtel 05/08/2017, 2:39 PM

## 2017-05-09 ENCOUNTER — Inpatient Hospital Stay: Payer: Medicare Other | Admitting: Family Medicine

## 2017-05-09 LAB — GLUCOSE, CAPILLARY
GLUCOSE-CAPILLARY: 158 mg/dL — AB (ref 65–99)
Glucose-Capillary: 129 mg/dL — ABNORMAL HIGH (ref 65–99)

## 2017-05-09 LAB — PROTIME-INR
INR: 1.62
PROTHROMBIN TIME: 19.4 s — AB (ref 11.4–15.2)

## 2017-05-09 MED ORDER — PANTOPRAZOLE SODIUM 40 MG PO TBEC
40.0000 mg | DELAYED_RELEASE_TABLET | Freq: Every day | ORAL | 2 refills | Status: DC
Start: 2017-05-09 — End: 2018-01-29

## 2017-05-09 MED ORDER — IPRATROPIUM-ALBUTEROL 0.5-2.5 (3) MG/3ML IN SOLN: 3.0000 mL | RESPIRATORY_TRACT | 3 refills | Status: DC | PRN

## 2017-05-09 MED ORDER — ACETAMINOPHEN 500 MG PO TABS: 500.0000 mg | ORAL_TABLET | Freq: Four times a day (QID) | ORAL | 0 refills | Status: DC | PRN

## 2017-05-09 MED ORDER — WARFARIN SODIUM 5 MG PO TABS: 10.0000 mg | ORAL_TABLET | Freq: Every day | ORAL | 0 refills | Status: DC

## 2017-05-09 MED ORDER — ENOXAPARIN SODIUM 150 MG/ML ~~LOC~~ SOLN: 160.0000 mg | Freq: Two times a day (BID) | SUBCUTANEOUS | 0 refills | Status: DC

## 2017-05-09 MED ORDER — WARFARIN SODIUM 7.5 MG PO TABS: 17.5000 mg | ORAL_TABLET | Freq: Once | ORAL | Status: DC

## 2017-05-09 MED ORDER — WARFARIN SODIUM 2.5 MG PO TABS: 15.0000 mg | ORAL_TABLET | Freq: Every day | ORAL | 0 refills | Status: DC

## 2017-05-09 NOTE — Progress Notes (Signed)
Patient will discharge to [facility]. Anticipated discharge date: 05/09/17 Family notified: patient preferred to call daughter himself Transportation by: PTAR  Nurse to call report to [phone].  CSW signing off.  Estanislado Emms, [place]  Clinical Social Worker

## 2017-05-09 NOTE — Progress Notes (Signed)
ANTICOAGULATION CONSULT NOTE - Follow Up Consult  Pharmacy Consult:  Lovenox / Coumadin Indication:  New DVT + History of Afib  No Known Allergies  Patient Measurements: Height: 6' (182.9 cm) Weight: (!) 360 lb (163.3 kg) IBW/kg (Calculated) : 77.6  Vital Signs: Temp: 98 F (36.7 C) (08/15 0504) Temp Source: Oral (08/15 0504) BP: 118/74 (08/15 0504) Pulse Rate: 74 (08/15 0504)  Labs:  Recent Labs  05/07/17 0211 05/08/17 0235 05/09/17 0519  HGB 13.7 13.8  --   HCT 41.7 43.6  --   PLT 301 302  --   LABPROT 15.5* 17.4* 19.4*  INR 1.22 1.41 1.62  CREATININE 1.19 1.25*  --     Estimated Creatinine Clearance: 104.4 mL/min (A) (by C-G formula based on SCr of 1.25 mg/dL (H)).     Assessment: 77 YOM admitted with new DVT while on Xarelto for Afib.  Currently on Lovenox bridge to Coumadin.    INR is sub-therapeutic but trending up.  Renal function is stable with CrCL appropriate for BID dosing of Lovenox.  No bleeding reported.   Goal of Therapy:  INR 2-3 Anti-Xa level 0.6-1 units/ml 4hrs after LMWH dose given Monitor platelets by anticoagulation protocol: Yes    Plan:  Repeat Coumadin 17.9m PO today Continue Lovenox 1667mSQ Q12H Daily PT / INR CBC Q72H while on Lovenox    Manpreet Kemmer D. DaMina MarblePharmD, BCPS Pager:  [phone]/15/2018, 11:31 AM

## 2017-05-09 NOTE — Progress Notes (Signed)
Family Medicine Teaching Service Daily Progress Note Intern Pager: [phone]  Patient name: Arien Benincasa Medical record number: [id number] Date of birth: 11/07/1960 Age: 56 y.o. Gender: male  Primary Care Provider: Sherene Sires, DO Consultants: SW Code Status: FULL  Pt Overview and Major Events to Date:  Maria Gallicchio a 56 y.o.malepresenting with 5 days left leg pain shown by [place] to be saphenous vein clot. PMH is significant for recurrent DVT, Afib, CHF (EF 60-65% with severe concentric hypertrophy), OSA on CPAP, HTN. Overnight on 8/9-8/10 he had bradycardia intermittently into the 20s-30s when trying to sleep without his CPAP machine but that appears to be machine error due to afib and patient was consistently asymptomatic and ECG 8/11 was consistent with afib.  Assessment and Plan: Corion Sherrod a 56 y.o.malepresenting with 5 days left leg pain. PMH is significant for recurrent DVT, Afib, CHF (EF 60-65% with severe concentric hypertrophy), OSA on CPAP, HTN.  L leg pain with thrombosis of L greater saphenous vein: Had Darkening of LLE and asymmetric swelling compared to R. Had acute DVT of left femoral vein and left popliteal vein during last hospitalization and repeat LE doppler in ED showed no propagation but superficial thrombosis of L greater saphenous vein from ankle to mid thigh.Currenlty bridging lovenox to warfarin.  -on telemetry -vascular consulted, recommended transition tocoumadin -coumadin per pharmacy -lovenox bridge (5days (2 of which must be therapuetic range) starting evening 8/10) -routine pulse/sensation checks  -tramadol 50 mg q6h prn and tylenol 514m q6h prn for pain control -snf placement at [facility] approved -Follow INR(1.62)  Chronic Dyspnea: Improved since last hospitalizationper patient.  - Continue hometorsemide 40 mg QHS - Strict I/O, monitor daily weights - Continue cardiac monitor - Continue home oxygen (5L)  -flonase for nasal  decongestion  CHF: Hometorsemide. On home 5L for chronic respiratory failure and maintaining appropriate O2 sats. Echo performed 7/22 which revealed 60-65% EF with pulm hypertension. - Continue hometorsemide - Strict I/O, monitor daily weights - Continue home lisinopril at 10 mg (decreased during last admission) - Continue cardiac monitor - Continue home oxygen (5L)  Atrial fibrillation w/o RVR: chad-vasc score 6. Not on rate or rhythm control. Previously on Xarelto.Denies history of GI bleed. - coumadin per pharmacy/lovenox bridge started 8/10 - cardiac monitor - Monitor vitals  T2DM: CBGs stable in mid 100s. Last A1c 7.3 on 04/14/17. Controlled on metformin and glyburide at home.  - sensitive SSI insulin and qachs CBGs   Asthma/COPD: No prior formal diagnosis. Never smoker.On home 5L [place].  -PRN dounebs -would refer to Pulmonology outpatient, as also had pulmonary nodule noted during last hospitalization that will require monitoring and patient suspects CPAP machine will need replacing soon  Hypertension: Stable.  -Continuehome lisinopril and amlodipine  OSA/?OHS -Nightly CPAP  Lung nodule:7 mm solid-appearing nodule noted in the right lung on CTA. Follow up CT at 18-24 months recommended.   FEN/GI:heart healthy/carb modified diet  Prophylaxis: on full anticoagulation  Disposition: [facility]  Subjective:  Patient says only mild pain in R knee with no febrile symptoms.   Is looking forward to discharge.   Feels clot on left leg has been getting better.   He understands the importance of managing his anticoagulation medicine  Objective: Temp:  [98 F (36.7 C)-99 F (37.2 C)] 98 F (36.7 C) (08/15 0504) Pulse Rate:  [72-79] 74 (08/15 0504) Resp:  [18-26] 18 (08/15 0504) BP: (114-118)/(70-74) 118/74 (08/15 0504) SpO2:  [92 %-97 %] 97 % (08/15 0504)  Physical Exam: General: lying in bed,alert and conversational, obese, NAD but breathing is  slightly labored at baseline Cardiovascular: irregularly irregular,no murmur appreciated; DP pulses are now symmetrical  Respiratory: satting mid 90s on 5L nasal canula(home dose),appears to have slightly increasedWOB, no wheezes noted Gastrointestinal: soft belly with no tendernesss to deep palpation, bowel sounds in 4 quadrants MSK: improvingasymmetrical swelling/discoloration in the left lower leg. No pitting edema of LLE and it no longer firm in relation to right leg. Chronic venous stasis changes. New onset R knee pain after PT yesterday, patient thinks he worked too hard Derm: skin changes including color to deep purple/black/red, texture to thick on left lower leg.  Neuro: sensation to distal LE is now symmetrical  Psych: appropriate and pleasant  Laboratory:  Recent Labs Lab 05/06/17 0301 05/07/17 0211 05/08/17 0235  WBC 8.9 10.2 9.6  HGB 13.7 13.7 13.8  HCT 42.8 41.7 43.6  PLT 307 301 302    Recent Labs Lab 05/03/17 1730  05/06/17 0301 05/07/17 0211 05/08/17 0235  NA 141  < > 139 136 140  K 4.1  < > 3.9 3.6 3.8  CL 101  < > 100* 100* 100*  CO2 30  < > _0 BUN 25*  < > 24* 27* 27*  CREATININE 1.33*  < > 1.27* 1.19 1.25*  CALCIUM 9.4  < > 8.9 8.8* 9.1  PROT 7.7  --   --   --   --   BILITOT 1.4*  --   --   --   --   ALKPHOS 80  --   --   --   --   ALT 16*  --   --   --   --   AST 27  --   --   --   --   GLUCOSE 111*  < > 137* 145* 145*  < > = values in this interval not displayed.    Imaging/Diagnostic Tests: Dg Chest 2 View  Result Date: 05/03/2017 CLINICAL DATA:  Low-grade fever.  Recent pneumonia. EXAM: CHEST  2 VIEW COMPARISON:  04/13/2017 FINDINGS: Moderate cardiomegaly and chronic pulmonary vascular congestion are stable in appearance. No evidence of acute infiltrate or pleural effusion. IMPRESSION: Stable cardiomegaly and chronic pulmonary vascular congestion. No acute findings . Electronically Signed   By: Earle Gell M.D.   On:  05/03/2017 18:45     Sherene Sires, DO 05/09/2017, 9:25 AM PGY-1, [place] Intern pager: [phone], text pages welcome

## 2017-05-09 NOTE — Care Management Note (Signed)
Case Management Note  Patient Details  Name: Roberto [name] MRN: [id number] Date of Birth: Jun 29, 1961  Subjective/Objective: Pt presented for  Chronic Systolic Heart Failure- Recurrent DVT's. Pt with progressive LLE edema and pain with ambulation. Lovenox/ Coumadin Bridge- plan will be for pt to d/c to [facility] SNF.                    Action/Plan: CSW assisting with disposition needs. CM did receive a consult for RW at d/c. CM will not be providing pt with DME at d/c. SNF will make recommendations on DME once stable for d/c. No further needs from CM at this time.   Expected Discharge Date:  05/09/17               Expected Discharge Plan:  Skilled Nursing Facility  In-House Referral:  Clinical Social Work  Discharge planning Services  CM Consult  Post Acute Care Choice:  NA Choice offered to:  NA  DME Arranged:  N/A DME Agency:  NA  HH Arranged:  NA HH Agency:  NA  Status of Service:  Completed, signed off  If discussed at [employer] of Stay Meetings, dates discussed:    Additional Comments:  Bethena Roys, RN 05/09/2017, 2:52 PM

## 2017-05-09 NOTE — Clinical Social Work Placement (Signed)
CLINICAL SOCIAL WORK PLACEMENT  NOTE  Date:  05/09/2017  Patient Details  Name: Roberto [name] MRN: [id number] Date of Birth: [birth year]  Clinical Social Work is seeking post-discharge placement for this patient at the [place] level of care (*CSW will initial, date and re-position this form in  chart as items are completed):  Yes   Patient/family provided with [facility]'s list of facilities offering this level of care within the geographic area requested by the patient (or if unable, by the patient's family).  Yes   Patient/family informed of their freedom to choose among providers that offer the needed level of care, that participate in Medicare, Medicaid or managed care program needed by the patient, have an available bed and are willing to accept the patient.  Yes   Patient/family informed of 's ownership interest in [facility] and [facility], as well as of the fact that they are under no obligation to receive care at these facilities.  PASRR submitted to EDS on 05/07/17     PASRR number received on 05/07/17     Existing PASRR number confirmed on       FL2 transmitted to all facilities in geographic area requested by pt/family on 05/07/17     FL2 transmitted to all facilities within larger geographic area on       Patient informed that his/her managed care company has contracts with or will negotiate with certain facilities, including the following:  [facility]     Yes   Patient/family informed of bed offers received.  Patient chooses bed at [facility]     Physician recommends and patient chooses bed at      Patient to be transferred to [facility] on 05/09/17.  Patient to be transferred to facility by [facility]     Patient family notified on 05/09/17 of transfer.  Name of family member notified:        PHYSICIAN Please prepare priority  discharge summary, including medications, Please prepare prescriptions     Additional Comment:    _______________________________________________ Estanislado Emms, LCSW 05/09/2017, 1:32 PM

## 2017-05-10 ENCOUNTER — Non-Acute Institutional Stay (SKILLED_NURSING_FACILITY): Payer: Medicare Other | Admitting: Adult Health

## 2017-05-10 ENCOUNTER — Encounter: Payer: Self-pay | Admitting: Adult Health

## 2017-05-10 DIAGNOSIS — I5042 Chronic combined systolic (congestive) and diastolic (congestive) heart failure: Secondary | ICD-10-CM

## 2017-05-10 DIAGNOSIS — I824Y2 Acute embolism and thrombosis of unspecified deep veins of left proximal lower extremity: Secondary | ICD-10-CM | POA: Diagnosis not present

## 2017-05-10 DIAGNOSIS — K219 Gastro-esophageal reflux disease without esophagitis: Secondary | ICD-10-CM | POA: Diagnosis not present

## 2017-05-10 DIAGNOSIS — I482 Chronic atrial fibrillation, unspecified: Secondary | ICD-10-CM

## 2017-05-10 DIAGNOSIS — J9611 Chronic respiratory failure with hypoxia: Secondary | ICD-10-CM | POA: Diagnosis not present

## 2017-05-10 DIAGNOSIS — E1142 Type 2 diabetes mellitus with diabetic polyneuropathy: Secondary | ICD-10-CM | POA: Diagnosis not present

## 2017-05-10 DIAGNOSIS — I82412 Acute embolism and thrombosis of left femoral vein: Secondary | ICD-10-CM | POA: Diagnosis not present

## 2017-05-10 NOTE — Progress Notes (Signed)
Location:   [place] Room Number: 118 A Place of Service:  SNF (31)   CODE STATUS: Full Code  No Known Allergies  Chief Complaint  Patient presents with  . Hospitalization Follow-up    Hospital follow up    HPI:   He has had a recent large left femoral dvt found on 04-14-17.  He had been discharged at that time on xarelto. He was unable to get this medication for 3 days and used his sister's lovenox. He did get his prescription filled. He did develop left leg pain once again and was found to have a greater saphenous dvt. He was placed on heparin drip and was bridged with lovenox to coumadin therapy. He has chronic respiratory failure with hypoxia on chronic 02 on 5L. He tells me that he is feeling better; still has some numbness in his left leg but is less. His goal is return back home when he is able. There are no nursing concerns at this time.   Past Medical History:  Diagnosis Date  . Asthma   . CHF (congestive heart failure) ([place])   . COPD (chronic obstructive pulmonary disease) ([place])   . Diabetes mellitus without complication ([place])   . Hypertension     Past Surgical History:  Procedure Laterality Date  . SHOULDER SURGERY      Social History   Social History  . Marital status: Single    Spouse name: N/A  . Number of children: N/A  . Years of education: N/A   Occupational History  . Not on file.   Social History Main Topics  . Smoking status: Never Smoker  . Smokeless tobacco: Never Used  . Alcohol use No  . Drug use: No  . Sexual activity: Not on file   Other Topics Concern  . Not on file   Social History Narrative  . No narrative on file   Family History  Problem Relation Age of Onset  . Diabetes Mother   . Cancer Mother   . Diabetes Father   . High blood pressure Father   . Diabetes Brother       VITAL SIGNS BP 115/71   Pulse 90   Temp 98.2 F (36.8 C)   Resp 20   Ht 6' (1.829 m)   Wt (!) 350 lb (158.8 kg)   SpO2 94%    BMI 47.47 kg/m    Patient's Medications  New Prescriptions   No medications on file  Previous Medications   ACETAMINOPHEN (TYLENOL) 500 MG TABLET    Take 1 tablet (500 mg total) by mouth every 6 (six) hours as needed for moderate pain.   AMLODIPINE (NORVASC) 5 MG TABLET    Take 5 mg by mouth daily.   CHOLECALCIFEROL (VITAMIN D) 1000 UNITS TABLET    Take 1,000 Units by mouth daily.   ENOXAPARIN (LOVENOX) 150 MG/ML INJECTION    Inject 1.07 mLs (160 mg total) into the skin every 12 (twelve) hours. (dispense 43m syringes) Inject two (2) 859msyringes into the skin every 12 hours.   FERROUS SULFATE 325 (65 FE) MG TABLET    Take 325 mg by mouth daily.   IPRATROPIUM-ALBUTEROL (DUONEB) 0.5-2.5 (3) MG/3ML SOLN    Take 3 mLs by nebulization every 4 (four) hours as needed.   LISINOPRIL (PRINIVIL,ZESTRIL) 10 MG TABLET    Take 1 tablet (10 mg total) by mouth daily.   METFORMIN (GLUCOPHAGE) 1000 MG TABLET    Take 1,000 mg by mouth 2 (  two) times daily.   MULTIPLE VITAMIN (MULTIVITAMIN WITH MINERALS) TABS TABLET    Take 1 tablet by mouth daily.   NAPROXEN SODIUM (ALEVE) 220 MG TABLET    Take 220 mg by mouth every 12 (twelve) hours as needed (pain).   OXYGEN    Inhale 5 L into the lungs continuous.   PANTOPRAZOLE (PROTONIX) 40 MG TABLET    Take 1 tablet (40 mg total) by mouth daily.   PRESCRIPTION MEDICATION    Inhale into the lungs as needed (whenever sleeping - naps or at night). CPAP   TORSEMIDE (DEMADEX) 20 MG TABLET    Take 40 mg by mouth at bedtime.   WARFARIN (COUMADIN) 2.5 MG TABLET    Take 6 tablets (15 mg total) by mouth daily. MUST have INR check 8/17 and 8/20 to manage lovenox/warfarin bridge with pharmacy management  Modified Medications   No medications on file  Discontinued Medications   GLYBURIDE (DIABETA) 5 MG TABLET    Take 5 mg by mouth at bedtime as needed (high blood sugar).    WARFARIN (COUMADIN) 5 MG TABLET    Take 2 tablets (10 mg total) by mouth daily. MUST attend followup appts  for monitoring and dose adjustments     SIGNIFICANT DIAGNOSTIC EXAMS  TODAY:   04-13-17: ct angio of chest: Limited evaluation with suboptimal contrast opacification of the bilateral pulmonary arteries. No evidence of central or lobar pulmonary embolism. Segmental or subsegmental pulmonary embolism cannot be excluded. Mild patchy opacity in the posterior right upper lobe, possibly reflecting mild pneumonia. 7 mm right lower lobe pulmonary nodule. Non-contrast chest CT at 6-12 months is recommended  04-14-17: venous doppler: - Technically limited due to body habitus. - No evidence of deep vein thrombosis involving the visualized veins of the right lower extremity. - Findings consistent with acute deep vein thrombosis involving the left femoral vein and left popliteal vein. - . Incidental findings are consistent with: Large Baker&'s Cyst on  the right. - No evidence of Baker&'s cyst on the left.   04-15-17: 2-d echo: - The right ventricular systolic pressure was increased consistent with severe pulmonary hypertension. EF 60-65%- The right ventricular systolic pressure was increased consistent with severe pulmonary hypertension.  05-03-17: venous doppler: - Findings consistent with subacute deep vein thrombosis involving the femoral and popliteal veins left lower extremity. There appears to be no propagation of the previous DVT. - Unable to visualize the peroneal vein due to body habitus. - Also noted is a thrombosis of the greater saphenous vein from the ankle to the mid thigh which is new from previous study. - No evidence of Baker&'s cyst on the left.  05-08-17: chest x-ray: Cardiomegaly with mild pulmonary vascular congestion.   LABS REVIEWED:   04-14-17: tsh 1.523; hgb a1c 7.3 05-03-17: wbc 12.7; hgb 15.5; hct 48.1; plt 351; glucose 111; bun 25; creat 1.33; k+ 4.1; na++ 141; ca 9.4; total bili 1.4; albumin 3.9; BNP 113.2 05-08-17: wbc 9.6; ghb 13.8; hct 43.6; mcv 87.7; plt 302; glucose 145;  bun 27; creat 1.25; k+ 3.8; na++ 140; ca 9.1    Review of Systems  Constitutional: Negative for malaise/fatigue.  Respiratory: Negative for cough and shortness of breath.   Cardiovascular: Positive for leg swelling. Negative for chest pain and palpitations.       Left leg   Gastrointestinal: Negative for abdominal pain, constipation and heartburn.  Musculoskeletal: Positive for joint pain. Negative for back pain and myalgias.       Has  bilateral knee pain   Skin: Negative.   Neurological: Negative for dizziness.       Has numbness left lower leg   Psychiatric/Behavioral: The patient is not nervous/anxious.     Physical Exam  Constitutional: He is oriented to person, place, and time. He appears well-developed and well-nourished. No distress.  Morbidly obese   Eyes: Conjunctivae are normal.  Neck: Neck supple. No JVD present. No thyromegaly present.  Cardiovascular: Normal rate, regular rhythm and intact distal pulses.   Respiratory: Effort normal. No respiratory distress. He has no wheezes.  Lung sounds diminished bilateral bases Is 02 dependent at 5L Has CPAP    GI: Soft. Bowel sounds are normal. He exhibits no distension. There is no tenderness.  Musculoskeletal: He exhibits edema.  Able to move all extremities  Has 2-3+ lower extremity edema   Lymphadenopathy:    He has no cervical adenopathy.  Neurological: He is alert and oriented to person, place, and time.  Skin: Skin is warm and dry. He is not diaphoretic.  Bilateral lower extremity discoloration   Psychiatric: He has a normal mood and affect.    ASSESSMENT/ PLAN:  TODAY:   1. Chronic congestive heart failure: EF 60-65% (04-15-17) stable: will continue demadex 40 mg daily will monitor   2. Hypertension: stable b/p: 115/71: will continue norvasc 5 mg daily lisinopril 10 mg daily   3. Diabetes: hgb a1c 7.3; is stable will continue metformin 1 gm twice daily   4. Anemia: stable hgb 1.38; will continue iron daily    5. Chronic respiratory failure with hypoxia: is stable; is 02 dependent at 5L. Does use CPAP. Will continue duoneb every 4 hours as needed  6. GERD: is stable will continue protonix 40 mg daily     7. Morbid obesity: weight is 350 pounds. His max weight was 520 he is losing weight.   8. DVT left saphenous vein and left femoral vein: is stable will continue lovenox 160 mg every 12 hours until INR >2. Is on coumadin 15 mg daily  9. Afib: heart rate is stable; will continue coumadin therapy.     MD is aware of resident's narcotic use and is in agreement with current plan of care. We will attempt to wean resident as apropriate   Ok Edwards NP [facility]  Contact [phone] Monday through Friday 8am- 5pm  After hours call [phone]

## 2017-05-14 ENCOUNTER — Non-Acute Institutional Stay (SKILLED_NURSING_FACILITY): Payer: Medicare Other | Admitting: Internal Medicine

## 2017-05-14 ENCOUNTER — Encounter: Payer: Self-pay | Admitting: Internal Medicine

## 2017-05-14 DIAGNOSIS — I5042 Chronic combined systolic (congestive) and diastolic (congestive) heart failure: Secondary | ICD-10-CM | POA: Diagnosis not present

## 2017-05-14 DIAGNOSIS — J9611 Chronic respiratory failure with hypoxia: Secondary | ICD-10-CM

## 2017-05-14 DIAGNOSIS — G4733 Obstructive sleep apnea (adult) (pediatric): Secondary | ICD-10-CM

## 2017-05-14 DIAGNOSIS — I82492 Acute embolism and thrombosis of other specified deep vein of left lower extremity: Secondary | ICD-10-CM | POA: Diagnosis not present

## 2017-05-14 DIAGNOSIS — Z9989 Dependence on other enabling machines and devices: Secondary | ICD-10-CM

## 2017-05-14 DIAGNOSIS — D638 Anemia in other chronic diseases classified elsewhere: Secondary | ICD-10-CM | POA: Diagnosis not present

## 2017-05-14 DIAGNOSIS — Z7901 Long term (current) use of anticoagulants: Secondary | ICD-10-CM | POA: Diagnosis not present

## 2017-05-14 DIAGNOSIS — I480 Paroxysmal atrial fibrillation: Secondary | ICD-10-CM

## 2017-05-14 DIAGNOSIS — E1142 Type 2 diabetes mellitus with diabetic polyneuropathy: Secondary | ICD-10-CM | POA: Diagnosis not present

## 2017-05-14 NOTE — Progress Notes (Signed)
Patient ID: Roberto [name], male   DOB: [birth year], 56 y.o.   MRN: [id number]     HISTORY AND PHYSICAL   DATE:  May 14, 2017  Location:   [facility] Room Number: 118 A Place of Service: SNF (31)   Extended Emergency Contact Information Primary Emergency Contact: Lynann [place] Mobile Phone: [phone] Relation: Mother  Advanced Directive information Does Patient Have a Medical Advance Directive?: No, Would patient like information on creating a medical advance directive?: No - Patient declined  Chief Complaint  Patient presents with  . New Admit To SNF    Admission    HPI:  56 yo male seen today as a new admission into SNF following hospital stay for LLE DVT, afib, chronic respiratory failure with hypoxia, CHF, DM. He presented to the ED with 3 day hx left leg pain. Venous doppler US revealed (+) saphenous vein clot. Vascular sx consulted and recommended warfarin instead of xeralto-->heparin-->lovenox bridge until INR >2. On 05/09/17, INR 1.62; WBC 12.7K-->9.6K; serum glucose 120-140s; abs neutrophils 10.5K; A1c 7.3% in July 2018. Cr 1.33-->1.25 at d/c. He presents to SNF for long term care.  Today he reports occasional pain in LLE but much improved. He is on lovenox injections. No bleeding or easy bruising. No bloody stools. He states he has tried coumadin in past but was never able to keep INR therapeutic which is why he was on xeralto. He had to stop med a few mos ago due to cost. He was always pretty active up until a month ago. He plans to relocate to [place] area and live with daughter so that health needs can be better met. CBGs 100-170s and occasionally <100, >200.  Chronic combined CHF - stable. EF 60-65% (04-15-17). takes demadex 40 mg daily   Hypertension - stable on norvasc 5 mg daily; lisinopril 10 mg daily   DM - controlled. A1c 7.3%; He takes metformin 1 gm twice daily; ACEI   Anemia of chronic disease - stable on iron  daily. Hgb 13.8  Chronic respiratory failure with hypoxia/OSA - stable. He is on [place] O2 at 5L/min. He uses CPAP qHS. He has duoneb every 4 hours as needed  GERD - stable on protonix 40 mg daily     Morbid obesity - current wt 350 lbs. His has been as heavy as 520 lbs.   PAF - rate controlled wo medication. Takes coumadin for anticoagulation. GOAL INR 2-3  Past Medical History:  Diagnosis Date  . Asthma   . CHF (congestive heart failure) ([facility])   . COPD (chronic obstructive pulmonary disease) ([facility])   . Diabetes mellitus without complication ([facility])   . Hypertension     Past Surgical History:  Procedure Laterality Date  . SHOULDER SURGERY      Patient Care Team: Sherene Sires, DO as PCP - General (Family Medicine)  Social History   Social History  . Marital status: Single    Spouse name: N/A  . Number of children: N/A  . Years of education: N/A   Occupational History  . Not on file.   Social History Main Topics  . Smoking status: Never Smoker  . Smokeless tobacco: Never Used  . Alcohol use No  . Drug use: No  . Sexual activity: Not on file   Other Topics Concern  . Not on file   Social History Narrative  . No narrative on file     reports that he has never smoked. He has  never used smokeless tobacco. He reports that he does not drink alcohol or use drugs.  Family History  Problem Relation Age of Onset  . Diabetes Mother   . Cancer Mother   . Diabetes Father   . High blood pressure Father   . Diabetes Brother    Family Status  Relation Status  . Mother (Not Specified)  . Father (Not Specified)  . Brother (Not Specified)    Immunization History  Administered Date(s) Administered  . PPD Test 05/09/2017    No Known Allergies  Medications: Patient's Medications  New Prescriptions   No medications on file  Previous Medications   ACETAMINOPHEN (TYLENOL) 500 MG TABLET    Take 1 tablet (500 mg total) by mouth every 6 (six) hours as needed for moderate  pain.   AMLODIPINE (NORVASC) 5 MG TABLET    Take 5 mg by mouth daily.   CHOLECALCIFEROL (VITAMIN D) 1000 UNITS TABLET    Take 1,000 Units by mouth daily.   ENOXAPARIN (LOVENOX) 150 MG/ML INJECTION    Inject 1.07 mLs (160 mg total) into the skin every 12 (twelve) hours. (dispense 28m syringes) Inject two (2) 832msyringes into the skin every 12 hours.   FERROUS SULFATE 325 (65 FE) MG TABLET    Take 325 mg by mouth daily.   IPRATROPIUM-ALBUTEROL (DUONEB) 0.5-2.5 (3) MG/3ML SOLN    Take 3 mLs by nebulization every 4 (four) hours as needed.   LISINOPRIL (PRINIVIL,ZESTRIL) 10 MG TABLET    Take 1 tablet (10 mg total) by mouth daily.   METFORMIN (GLUCOPHAGE) 1000 MG TABLET    Take 1,000 mg by mouth 2 (two) times daily.   MULTIPLE VITAMIN (MULTIVITAMIN WITH MINERALS) TABS TABLET    Take 1 tablet by mouth daily.   NAPROXEN SODIUM (ALEVE) 220 MG TABLET    Take 220 mg by mouth every 12 (twelve) hours as needed (pain).   OXYGEN    Inhale 5 L into the lungs continuous.   PANTOPRAZOLE (PROTONIX) 40 MG TABLET    Take 1 tablet (40 mg total) by mouth daily.   PRESCRIPTION MEDICATION    Inhale into the lungs as needed (whenever sleeping - naps or at night). CPAP   TORSEMIDE (DEMADEX) 20 MG TABLET    Take 40 mg by mouth at bedtime.   WARFARIN (COUMADIN) 1 MG TABLET    Take 1 mg by mouth every evening. Give along with 1556mo equal 72m49mWARFARIN (COUMADIN) 2.5 MG TABLET    Take 6 tablets (15 mg total) by mouth daily. MUST have INR check 8/17 and 8/20 to manage lovenox/warfarin bridge with pharmacy management  Modified Medications   No medications on file  Discontinued Medications   No medications on file    Review of Systems  Respiratory: Positive for shortness of breath.   Musculoskeletal: Positive for arthralgias and gait problem.  All other systems reviewed and are negative.   Vitals:   05/14/17 1130  BP: 130/80  Pulse: 80  Resp: 18  Temp: 97.8 F (36.6 C)  SpO2: 93%  Weight: (!) 351 lb 3.2 oz  (159.3 kg)  Height: 6' (1.829 m)   Body mass index is 47.63 kg/m.  Physical Exam  Constitutional: He is oriented to person, place, and time. He appears well-developed and well-nourished.  Sitting in w/c in NAD; [place] O2 intact; morbidly obese  HENT:  Mouth/Throat: Oropharynx is clear and moist.  MMM; no oral thrush  Eyes: Pupils are equal, round, and reactive  to light. No scleral icterus.  Neck: Neck supple. Carotid bruit is not present. No thyromegaly present.  Cardiovascular: Normal rate, regular rhythm, normal heart sounds and intact distal pulses.  Exam reveals no gallop and no friction rub.   No murmur heard. Varicose veins, soft NT in L>RLE; no distal edema; no calf TTP or palpable cord  Pulmonary/Chest: Effort normal and breath sounds normal. He has no wheezes. He has no rales. He exhibits no tenderness.  Abdominal: Soft. Normal appearance and bowel sounds are normal. He exhibits no distension, no abdominal bruit, no pulsatile midline mass and no mass. There is no hepatomegaly. There is no tenderness. There is no rigidity, no rebound and no guarding. No hernia.  Musculoskeletal: He exhibits edema and tenderness.  Lymphadenopathy:    He has no cervical adenopathy.  Neurological: He is alert and oriented to person, place, and time.  Skin: Skin is warm and dry. No rash noted.  Psychiatric: He has a normal mood and affect. His behavior is normal. Judgment and thought content normal.     Labs reviewed: Admission on 05/03/2017, Discharged on 05/09/2017  Component Date Value Ref Range Status  . WBC 05/03/2017 12.7* 4.0 - 10.5 K/uL Final  . RBC 05/03/2017 5.40  4.22 - 5.81 MIL/uL Final  . Hemoglobin 05/03/2017 15.5  13.0 - 17.0 g/dL Final  . HCT 05/03/2017 48.1  39.0 - 52.0 % Final  . MCV 05/03/2017 89.1  78.0 - 100.0 fL Final  . MCH 05/03/2017 28.7  26.0 - 34.0 pg Final  . MCHC 05/03/2017 32.2  30.0 - 36.0 g/dL Final  . RDW 05/03/2017 15.3  11.5 - 15.5 % Final  . Platelets  05/03/2017 351  150 - 400 K/uL Final  . Neutrophils Relative % 05/03/2017 83  % Final  . Neutro Abs 05/03/2017 10.5* 1.7 - 7.7 K/uL Final  . Lymphocytes Relative 05/03/2017 5  % Final  . Lymphs Abs 05/03/2017 0.6* 0.7 - 4.0 K/uL Final  . Monocytes Relative 05/03/2017 11  % Final  . Monocytes Absolute 05/03/2017 1.4* 0.1 - 1.0 K/uL Final  . Eosinophils Relative 05/03/2017 1  % Final  . Eosinophils Absolute 05/03/2017 0.1  0.0 - 0.7 K/uL Final  . Basophils Relative 05/03/2017 0  % Final  . Basophils Absolute 05/03/2017 0.1  0.0 - 0.1 K/uL Final  . Sodium 05/03/2017 141  135 - 145 mmol/L Final  . Potassium 05/03/2017 4.1  3.5 - 5.1 mmol/L Final  . Chloride 05/03/2017 101  101 - 111 mmol/L Final  . CO2 05/03/2017 30  22 - 32 mmol/L Final  . Glucose, Bld 05/03/2017 111* 65 - 99 mg/dL Final  . BUN 05/03/2017 25* 6 - 20 mg/dL Final  . Creatinine, Ser 05/03/2017 1.33* 0.61 - 1.24 mg/dL Final  . Calcium 05/03/2017 9.4  8.9 - 10.3 mg/dL Final  . Total Protein 05/03/2017 7.7  6.5 - 8.1 g/dL Final  . Albumin 05/03/2017 3.9  3.5 - 5.0 g/dL Final  . AST 05/03/2017 27  15 - 41 U/L Final  . ALT 05/03/2017 16* 17 - 63 U/L Final  . Alkaline Phosphatase 05/03/2017 80  38 - 126 U/L Final  . Total Bilirubin 05/03/2017 1.4* 0.3 - 1.2 mg/dL Final  . GFR calc non Af Amer 05/03/2017 58* >60 mL/min Final  . GFR calc Af Amer 05/03/2017 >60  >60 mL/min Final   Comment: (NOTE) The eGFR has been calculated using the CKD EPI equation. This calculation has not been validated in all  clinical situations. eGFR's persistently <60 mL/min signify possible Chronic Kidney Disease.   . Anion gap 05/03/2017 10  5 - 15 Final  . B Natriuretic Peptide 05/03/2017 113.2* 0.0 - 100.0 pg/mL Final  . Prothrombin Time 05/03/2017 20.8* 11.4 - 15.2 seconds Final  . INR 05/03/2017 1.76   Final  . aPTT 05/03/2017 44* 24 - 36 seconds Final   Comment:        IF BASELINE aPTT IS ELEVATED, SUGGEST PATIENT RISK ASSESSMENT BE USED TO  DETERMINE APPROPRIATE ANTICOAGULANT THERAPY.   . Heparin Unfractionated 05/03/2017 >2.20* 0.30 - 0.70 IU/mL Final   Comment: RESULTS CONFIRMED BY MANUAL DILUTION        IF HEPARIN RESULTS ARE BELOW EXPECTED VALUES, AND PATIENT DOSAGE HAS BEEN CONFIRMED, SUGGEST FOLLOW UP TESTING OF ANTITHROMBIN III LEVELS.   . WBC 05/04/2017 9.2  4.0 - 10.5 K/uL Final  . RBC 05/04/2017 5.15  4.22 - 5.81 MIL/uL Final  . Hemoglobin 05/04/2017 14.3  13.0 - 17.0 g/dL Final  . HCT 05/04/2017 46.8  39.0 - 52.0 % Final  . MCV 05/04/2017 90.9  78.0 - 100.0 fL Final  . MCH 05/04/2017 27.8  26.0 - 34.0 pg Final  . MCHC 05/04/2017 30.6  30.0 - 36.0 g/dL Final  . RDW 05/04/2017 15.6* 11.5 - 15.5 % Final  . Platelets 05/04/2017 298  150 - 400 K/uL Final  . aPTT 05/04/2017 50* 24 - 36 seconds Final   Comment:        IF BASELINE aPTT IS ELEVATED, SUGGEST PATIENT RISK ASSESSMENT BE USED TO DETERMINE APPROPRIATE ANTICOAGULANT THERAPY.   . Glucose-Capillary 05/04/2017 150* 65 - 99 mg/dL Final  . Comment 1 05/04/2017 Notify RN   Final  . Comment 2 05/04/2017 Document in Chart   Final  . Total CK 05/04/2017 35* 49 - 397 U/L Final  . aPTT 05/04/2017 70* 24 - 36 seconds Final   Comment:        IF BASELINE aPTT IS ELEVATED, SUGGEST PATIENT RISK ASSESSMENT BE USED TO DETERMINE APPROPRIATE ANTICOAGULANT THERAPY.   . Glucose-Capillary 05/04/2017 130* 65 - 99 mg/dL Final  . Glucose-Capillary 05/04/2017 155* 65 - 99 mg/dL Final  . aPTT 05/04/2017 58* 24 - 36 seconds Final   Comment:        IF BASELINE aPTT IS ELEVATED, SUGGEST PATIENT RISK ASSESSMENT BE USED TO DETERMINE APPROPRIATE ANTICOAGULANT THERAPY.   . Sodium 05/05/2017 138  135 - 145 mmol/L Final  . Potassium 05/05/2017 4.2  3.5 - 5.1 mmol/L Final  . Chloride 05/05/2017 99* 101 - 111 mmol/L Final  . CO2 05/05/2017 30  22 - 32 mmol/L Final  . Glucose, Bld 05/05/2017 126* 65 - 99 mg/dL Final  . BUN 05/05/2017 21* 6 - 20 mg/dL Final  . Creatinine, Ser  05/05/2017 1.17  0.61 - 1.24 mg/dL Final  . Calcium 05/05/2017 9.3  8.9 - 10.3 mg/dL Final  . GFR calc non Af Amer 05/05/2017 >60  >60 mL/min Final  . GFR calc Af Amer 05/05/2017 >60  >60 mL/min Final   Comment: (NOTE) The eGFR has been calculated using the CKD EPI equation. This calculation has not been validated in all clinical situations. eGFR's persistently <60 mL/min signify possible Chronic Kidney Disease.   . Anion gap 05/05/2017 9  5 - 15 Final  . Prothrombin Time 05/05/2017 15.2  11.4 - 15.2 seconds Final  . INR 05/05/2017 1.19   Final  . Glucose-Capillary 05/04/2017 116* 65 - 99 mg/dL Final  .  Comment 1 05/04/2017 Notify RN   Final  . Comment 2 05/04/2017 Document in Chart   Final  . Glucose-Capillary 05/04/2017 139* 65 - 99 mg/dL Final  . WBC 05/05/2017 9.9  4.0 - 10.5 K/uL Final  . RBC 05/05/2017 5.00  4.22 - 5.81 MIL/uL Final  . Hemoglobin 05/05/2017 14.2  13.0 - 17.0 g/dL Final  . HCT 05/05/2017 44.2  39.0 - 52.0 % Final  . MCV 05/05/2017 88.4  78.0 - 100.0 fL Final  . MCH 05/05/2017 28.4  26.0 - 34.0 pg Final  . MCHC 05/05/2017 32.1  30.0 - 36.0 g/dL Final  . RDW 05/05/2017 14.7  11.5 - 15.5 % Final  . Platelets 05/05/2017 295  150 - 400 K/uL Final  . Glucose-Capillary 05/05/2017 109* 65 - 99 mg/dL Final  . Glucose-Capillary 05/05/2017 131* 65 - 99 mg/dL Final  . Glucose-Capillary 05/05/2017 125* 65 - 99 mg/dL Final  . Prothrombin Time 05/06/2017 15.6* 11.4 - 15.2 seconds Final  . INR 05/06/2017 1.23   Final  . WBC 05/06/2017 8.9  4.0 - 10.5 K/uL Final  . RBC 05/06/2017 4.87  4.22 - 5.81 MIL/uL Final  . Hemoglobin 05/06/2017 13.7  13.0 - 17.0 g/dL Final  . HCT 05/06/2017 42.8  39.0 - 52.0 % Final  . MCV 05/06/2017 87.9  78.0 - 100.0 fL Final  . MCH 05/06/2017 28.1  26.0 - 34.0 pg Final  . MCHC 05/06/2017 32.0  30.0 - 36.0 g/dL Final  . RDW 05/06/2017 14.8  11.5 - 15.5 % Final  . Platelets 05/06/2017 307  150 - 400 K/uL Final  . Sodium 05/06/2017 139  135 - 145  mmol/L Final  . Potassium 05/06/2017 3.9  3.5 - 5.1 mmol/L Final  . Chloride 05/06/2017 100* 101 - 111 mmol/L Final  . CO2 05/06/2017 31  22 - 32 mmol/L Final  . Glucose, Bld 05/06/2017 137* 65 - 99 mg/dL Final  . BUN 05/06/2017 24* 6 - 20 mg/dL Final  . Creatinine, Ser 05/06/2017 1.27* 0.61 - 1.24 mg/dL Final  . Calcium 05/06/2017 8.9  8.9 - 10.3 mg/dL Final  . GFR calc non Af Amer 05/06/2017 >60  >60 mL/min Final  . GFR calc Af Amer 05/06/2017 >60  >60 mL/min Final   Comment: (NOTE) The eGFR has been calculated using the CKD EPI equation. This calculation has not been validated in all clinical situations. eGFR's persistently <60 mL/min signify possible Chronic Kidney Disease.   . Anion gap 05/06/2017 8  5 - 15 Final  . Glucose-Capillary 05/05/2017 144* 65 - 99 mg/dL Final  . Glucose-Capillary 05/06/2017 126* 65 - 99 mg/dL Final  . Glucose-Capillary 05/06/2017 145* 65 - 99 mg/dL Final  . Prothrombin Time 05/07/2017 15.5* 11.4 - 15.2 seconds Final  . INR 05/07/2017 1.22   Final  . Glucose-Capillary 05/06/2017 143* 65 - 99 mg/dL Final  . Glucose-Capillary 05/06/2017 157* 65 - 99 mg/dL Final  . WBC 05/07/2017 10.2  4.0 - 10.5 K/uL Final  . RBC 05/07/2017 4.79  4.22 - 5.81 MIL/uL Final  . Hemoglobin 05/07/2017 13.7  13.0 - 17.0 g/dL Final  . HCT 05/07/2017 41.7  39.0 - 52.0 % Final  . MCV 05/07/2017 87.1  78.0 - 100.0 fL Final  . MCH 05/07/2017 28.6  26.0 - 34.0 pg Final  . MCHC 05/07/2017 32.9  30.0 - 36.0 g/dL Final  . RDW 05/07/2017 14.6  11.5 - 15.5 % Final  . Platelets 05/07/2017 301  150 - 400 K/uL  Final  . Sodium 05/07/2017 136  135 - 145 mmol/L Final  . Potassium 05/07/2017 3.6  3.5 - 5.1 mmol/L Final  . Chloride 05/07/2017 100* 101 - 111 mmol/L Final  . CO2 05/07/2017 27  22 - 32 mmol/L Final  . Glucose, Bld 05/07/2017 145* 65 - 99 mg/dL Final  . BUN 05/07/2017 27* 6 - 20 mg/dL Final  . Creatinine, Ser 05/07/2017 1.19  0.61 - 1.24 mg/dL Final  . Calcium 05/07/2017 8.8*  8.9 - 10.3 mg/dL Final  . GFR calc non Af Amer 05/07/2017 >60  >60 mL/min Final  . GFR calc Af Amer 05/07/2017 >60  >60 mL/min Final   Comment: (NOTE) The eGFR has been calculated using the CKD EPI equation. This calculation has not been validated in all clinical situations. eGFR's persistently <60 mL/min signify possible Chronic Kidney Disease.   . Anion gap 05/07/2017 9  5 - 15 Final  . Glucose-Capillary 05/07/2017 136* 65 - 99 mg/dL Final  . Glucose-Capillary 05/07/2017 122* 65 - 99 mg/dL Final  . Glucose-Capillary 05/07/2017 153* 65 - 99 mg/dL Final  . Prothrombin Time 05/08/2017 17.4* 11.4 - 15.2 seconds Final  . INR 05/08/2017 1.41   Final  . Sodium 05/08/2017 140  135 - 145 mmol/L Final  . Potassium 05/08/2017 3.8  3.5 - 5.1 mmol/L Final  . Chloride 05/08/2017 100* 101 - 111 mmol/L Final  . CO2 05/08/2017 30  22 - 32 mmol/L Final  . Glucose, Bld 05/08/2017 145* 65 - 99 mg/dL Final  . BUN 05/08/2017 27* 6 - 20 mg/dL Final  . Creatinine, Ser 05/08/2017 1.25* 0.61 - 1.24 mg/dL Final  . Calcium 05/08/2017 9.1  8.9 - 10.3 mg/dL Final  . GFR calc non Af Amer 05/08/2017 >60  >60 mL/min Final  . GFR calc Af Amer 05/08/2017 >60  >60 mL/min Final   Comment: (NOTE) The eGFR has been calculated using the CKD EPI equation. This calculation has not been validated in all clinical situations. eGFR's persistently <60 mL/min signify possible Chronic Kidney Disease.   . Anion gap 05/08/2017 10  5 - 15 Final  . Glucose-Capillary 05/07/2017 172* 65 - 99 mg/dL Final  . WBC 05/08/2017 9.6  4.0 - 10.5 K/uL Final  . RBC 05/08/2017 4.97  4.22 - 5.81 MIL/uL Final  . Hemoglobin 05/08/2017 13.8  13.0 - 17.0 g/dL Final  . HCT 05/08/2017 43.6  39.0 - 52.0 % Final  . MCV 05/08/2017 87.7  78.0 - 100.0 fL Final  . MCH 05/08/2017 27.8  26.0 - 34.0 pg Final  . MCHC 05/08/2017 31.7  30.0 - 36.0 g/dL Final  . RDW 05/08/2017 14.7  11.5 - 15.5 % Final  . Platelets 05/08/2017 302  150 - 400 K/uL Final  .  Glucose-Capillary 05/08/2017 122* 65 - 99 mg/dL Final  . Glucose-Capillary 05/08/2017 156* 65 - 99 mg/dL Final  . Glucose-Capillary 05/08/2017 167* 65 - 99 mg/dL Final  . Prothrombin Time 05/09/2017 19.4* 11.4 - 15.2 seconds Final  . INR 05/09/2017 1.62   Final  . Glucose-Capillary 05/08/2017 143* 65 - 99 mg/dL Final  . Glucose-Capillary 05/09/2017 158* 65 - 99 mg/dL Final  . Glucose-Capillary 05/09/2017 129* 65 - 99 mg/dL Final  Admission on 04/13/2017, Discharged on 04/18/2017  Component Date Value Ref Range Status  . WBC 04/13/2017 5.7  4.0 - 10.5 K/uL Final  . RBC 04/13/2017 5.18  4.22 - 5.81 MIL/uL Final  . Hemoglobin 04/13/2017 14.6  13.0 - 17.0 g/dL Final  .  HCT 04/13/2017 47.7  39.0 - 52.0 % Final  . MCV 04/13/2017 92.1  78.0 - 100.0 fL Final  . MCH 04/13/2017 28.2  26.0 - 34.0 pg Final  . MCHC 04/13/2017 30.6  30.0 - 36.0 g/dL Final  . RDW 04/13/2017 16.2* 11.5 - 15.5 % Final  . Platelets 04/13/2017 193  150 - 400 K/uL Final  . Neutrophils Relative % 04/13/2017 71  % Final  . Neutro Abs 04/13/2017 4.1  1.7 - 7.7 K/uL Final  . Lymphocytes Relative 04/13/2017 8  % Final  . Lymphs Abs 04/13/2017 0.5* 0.7 - 4.0 K/uL Final  . Monocytes Relative 04/13/2017 17  % Final  . Monocytes Absolute 04/13/2017 1.0  0.1 - 1.0 K/uL Final  . Eosinophils Relative 04/13/2017 3  % Final  . Eosinophils Absolute 04/13/2017 0.2  0.0 - 0.7 K/uL Final  . Basophils Relative 04/13/2017 1  % Final  . Basophils Absolute 04/13/2017 0.1  0.0 - 0.1 K/uL Final  . Sodium 04/13/2017 142  135 - 145 mmol/L Final  . Potassium 04/13/2017 3.8  3.5 - 5.1 mmol/L Final  . Chloride 04/13/2017 103  101 - 111 mmol/L Final  . CO2 04/13/2017 27  22 - 32 mmol/L Final  . Glucose, Bld 04/13/2017 141* 65 - 99 mg/dL Final  . BUN 04/13/2017 21* 6 - 20 mg/dL Final  . Creatinine, Ser 04/13/2017 1.45* 0.61 - 1.24 mg/dL Final  . Calcium 04/13/2017 9.1  8.9 - 10.3 mg/dL Final  . Total Protein 04/13/2017 7.2  6.5 - 8.1 g/dL Final    . Albumin 04/13/2017 4.0  3.5 - 5.0 g/dL Final  . AST 04/13/2017 23  15 - 41 U/L Final  . ALT 04/13/2017 15* 17 - 63 U/L Final  . Alkaline Phosphatase 04/13/2017 75  38 - 126 U/L Final  . Total Bilirubin 04/13/2017 1.2  0.3 - 1.2 mg/dL Final  . GFR calc non Af Amer 04/13/2017 52* >60 mL/min Final  . GFR calc Af Amer 04/13/2017 >60  >60 mL/min Final   Comment: (NOTE) The eGFR has been calculated using the CKD EPI equation. This calculation has not been validated in all clinical situations. eGFR's persistently <60 mL/min signify possible Chronic Kidney Disease.   . Anion gap 04/13/2017 12  5 - 15 Final  . B Natriuretic Peptide 04/13/2017 274.0* 0.0 - 100.0 pg/mL Final  . Troponin I 04/13/2017 <0.03  <0.03 ng/mL Final  . Glucose-Capillary 04/14/2017 122* 65 - 99 mg/dL Final  . HIV Screen 4th Generation wRfx 04/14/2017 Non Reactive  Non Reactive Final   Comment: (NOTE) A duplicate report has been generated due to demographic update of the patient's Date of Birth, Age, Gender, and/or Specimen Date. Please review patient results, reference intervals, and calculated results that may have been affected by this change. Performed At: [facility] [place], [place] [id number] Lindon Romp MD GX:[id number]   . TSH 04/14/2017 1.523  0.350 - 4.500 uIU/mL Final   Performed by a 3rd Generation assay with a functional sensitivity of <=0.01 uIU/mL.  . Troponin I 04/14/2017 <0.03  <0.03 ng/mL Final  . Troponin I 04/14/2017 <0.03  <0.03 ng/mL Final  . Troponin I 04/14/2017 <0.03  <0.03 ng/mL Final  . Hgb A1c MFr Bld 04/14/2017 7.3* 4.8 - 5.6 % Final   Comment: (NOTE)         Pre-diabetes: 5.7 - 6.4         Diabetes: >6.4  Glycemic control for adults with diabetes: <7.0   . Mean Plasma Glucose 04/14/2017 163  mg/dL Final   Comment: (NOTE) A duplicate report has been generated due to demographic updates. Performed At: [facility] [place], [place] [id number] Lindon Romp MD ZD:[id number]   . WBC 04/14/2017 5.4  4.0 - 10.5 K/uL Final  . RBC 04/14/2017 4.76  4.22 - 5.81 MIL/uL Final  . Hemoglobin 04/14/2017 13.5  13.0 - 17.0 g/dL Final  . HCT 04/14/2017 43.9  39.0 - 52.0 % Final  . MCV 04/14/2017 92.2  78.0 - 100.0 fL Final  . MCH 04/14/2017 28.4  26.0 - 34.0 pg Final  . MCHC 04/14/2017 30.8  30.0 - 36.0 g/dL Final  . RDW 04/14/2017 16.3* 11.5 - 15.5 % Final  . Platelets 04/14/2017 191  150 - 400 K/uL Final  . Sodium 04/14/2017 140  135 - 145 mmol/L Final  . Potassium 04/14/2017 3.8  3.5 - 5.1 mmol/L Final  . Chloride 04/14/2017 99* 101 - 111 mmol/L Final  . CO2 04/14/2017 30  22 - 32 mmol/L Final  . Glucose, Bld 04/14/2017 114* 65 - 99 mg/dL Final  . BUN 04/14/2017 20  6 - 20 mg/dL Final  . Creatinine, Ser 04/14/2017 1.39* 0.61 - 1.24 mg/dL Final  . Calcium 04/14/2017 8.7* 8.9 - 10.3 mg/dL Final  . GFR calc non Af Amer 04/14/2017 55* >60 mL/min Final  . GFR calc Af Amer 04/14/2017 >60  >60 mL/min Final   Comment: (NOTE) The eGFR has been calculated using the CKD EPI equation. This calculation has not been validated in all clinical situations. eGFR's persistently <60 mL/min signify possible Chronic Kidney Disease.   . Anion gap 04/14/2017 11  5 - 15 Final  . Weight 04/15/2017 6259.2  oz Final  . Height 04/15/2017 69  in Final  . BP 04/15/2017 96/55  mmHg Final  . Heparin Unfractionated 04/14/2017 0.25* 0.30 - 0.70 IU/mL Final   Comment:        IF HEPARIN RESULTS ARE BELOW EXPECTED VALUES, AND PATIENT DOSAGE HAS BEEN CONFIRMED, SUGGEST FOLLOW UP TESTING OF ANTITHROMBIN III LEVELS.   . Glucose-Capillary 04/14/2017 115* 65 - 99 mg/dL Final  . Comment 1 04/14/2017 Notify RN   Final  . Comment 2 04/14/2017 Document in Chart   Final  . Glucose-Capillary 04/14/2017 106* 65 - 99 mg/dL Final  . Comment 1 04/14/2017 Notify RN   Final  . Comment 2 04/14/2017 Document in Chart   Final  . Glucose-Capillary  04/14/2017 100* 65 - 99 mg/dL Final  . Comment 1 04/14/2017 Notify RN   Final  . Comment 2 04/14/2017 Document in Chart   Final  . Sodium 04/15/2017 141  135 - 145 mmol/L Final  . Potassium 04/15/2017 4.4  3.5 - 5.1 mmol/L Final   SLIGHT HEMOLYSIS  . Chloride 04/15/2017 103  101 - 111 mmol/L Final  . CO2 04/15/2017 30  22 - 32 mmol/L Final  . Glucose, Bld 04/15/2017 130* 65 - 99 mg/dL Final  . BUN 04/15/2017 25* 6 - 20 mg/dL Final  . Creatinine, Ser 04/15/2017 1.37* 0.61 - 1.24 mg/dL Final  . Calcium 04/15/2017 8.6* 8.9 - 10.3 mg/dL Final  . GFR calc non Af Amer 04/15/2017 56* >60 mL/min Final  . GFR calc Af Amer 04/15/2017 >60  >60 mL/min Final   Comment: (NOTE) The eGFR has been calculated using the CKD EPI equation. This calculation has not been validated in all  clinical situations. eGFR's persistently <60 mL/min signify possible Chronic Kidney Disease.   . Anion gap 04/15/2017 8  5 - 15 Final  . Glucose-Capillary 04/14/2017 125* 65 - 99 mg/dL Final  . Glucose-Capillary 04/15/2017 113* 65 - 99 mg/dL Final  . Comment 1 04/15/2017 Notify RN   Final  . Comment 2 04/15/2017 Document in Chart   Final  . Glucose-Capillary 04/15/2017 144* 65 - 99 mg/dL Final  . Comment 1 04/15/2017 Notify RN   Final  . Comment 2 04/15/2017 Document in Chart   Final  . Glucose-Capillary 04/15/2017 123* 65 - 99 mg/dL Final  . Comment 1 04/15/2017 Notify RN   Final  . Glucose-Capillary 04/15/2017 110* 65 - 99 mg/dL Final  . WBC 04/16/2017 6.0  4.0 - 10.5 K/uL Final  . RBC 04/16/2017 4.87  4.22 - 5.81 MIL/uL Final  . Hemoglobin 04/16/2017 13.4  13.0 - 17.0 g/dL Final  . HCT 04/16/2017 44.4  39.0 - 52.0 % Final  . MCV 04/16/2017 91.2  78.0 - 100.0 fL Final  . MCH 04/16/2017 27.5  26.0 - 34.0 pg Final  . MCHC 04/16/2017 30.2  30.0 - 36.0 g/dL Final  . RDW 04/16/2017 15.9* 11.5 - 15.5 % Final  . Platelets 04/16/2017 192  150 - 400 K/uL Final  . Sodium 04/16/2017 138  135 - 145 mmol/L Final  . Potassium  04/16/2017 4.3  3.5 - 5.1 mmol/L Final  . Chloride 04/16/2017 103  101 - 111 mmol/L Final  . CO2 04/16/2017 25  22 - 32 mmol/L Final  . Glucose, Bld 04/16/2017 102* 65 - 99 mg/dL Final  . BUN 04/16/2017 23* 6 - 20 mg/dL Final  . Creatinine, Ser 04/16/2017 1.22  0.61 - 1.24 mg/dL Final  . Calcium 04/16/2017 8.6* 8.9 - 10.3 mg/dL Final  . GFR calc non Af Amer 04/16/2017 >60  >60 mL/min Final  . GFR calc Af Amer 04/16/2017 >60  >60 mL/min Final   Comment: (NOTE) The eGFR has been calculated using the CKD EPI equation. This calculation has not been validated in all clinical situations. eGFR's persistently <60 mL/min signify possible Chronic Kidney Disease.   . Anion gap 04/16/2017 10  5 - 15 Final  . Glucose-Capillary 04/16/2017 102* 65 - 99 mg/dL Final  . Glucose-Capillary 04/16/2017 121* 65 - 99 mg/dL Final  . Glucose-Capillary 04/16/2017 114* 65 - 99 mg/dL Final  . Sodium 04/17/2017 142  135 - 145 mmol/L Final  . Potassium 04/17/2017 3.7  3.5 - 5.1 mmol/L Final  . Chloride 04/17/2017 103  101 - 111 mmol/L Final  . CO2 04/17/2017 33* 22 - 32 mmol/L Final  . Glucose, Bld 04/17/2017 102* 65 - 99 mg/dL Final  . BUN 04/17/2017 21* 6 - 20 mg/dL Final  . Creatinine, Ser 04/17/2017 1.19  0.61 - 1.24 mg/dL Final  . Calcium 04/17/2017 8.8* 8.9 - 10.3 mg/dL Final  . GFR calc non Af Amer 04/17/2017 >60  >60 mL/min Final  . GFR calc Af Amer 04/17/2017 >60  >60 mL/min Final   Comment: (NOTE) The eGFR has been calculated using the CKD EPI equation. This calculation has not been validated in all clinical situations. eGFR's persistently <60 mL/min signify possible Chronic Kidney Disease.   . Anion gap 04/17/2017 6  5 - 15 Final  . Glucose-Capillary 04/16/2017 110* 65 - 99 mg/dL Final  . Comment 1 04/16/2017 Notify RN   Final  . Comment 2 04/16/2017 Document in Chart   Final  .  Glucose-Capillary 04/17/2017 103* 65 - 99 mg/dL Final  . Glucose-Capillary 04/17/2017 125* 65 - 99 mg/dL Final  .  Glucose-Capillary 04/17/2017 109* 65 - 99 mg/dL Final  . Sodium 04/18/2017 139  135 - 145 mmol/L Final  . Potassium 04/18/2017 4.4  3.5 - 5.1 mmol/L Final   SLIGHT HEMOLYSIS  . Chloride 04/18/2017 96* 101 - 111 mmol/L Final  . CO2 04/18/2017 34* 22 - 32 mmol/L Final  . Glucose, Bld 04/18/2017 102* 65 - 99 mg/dL Final  . BUN 04/18/2017 23* 6 - 20 mg/dL Final  . Creatinine, Ser 04/18/2017 1.32* 0.61 - 1.24 mg/dL Final  . Calcium 04/18/2017 9.0  8.9 - 10.3 mg/dL Final  . GFR calc non Af Amer 04/18/2017 59* >60 mL/min Final  . GFR calc Af Amer 04/18/2017 >60  >60 mL/min Final   Comment: (NOTE) The eGFR has been calculated using the CKD EPI equation. This calculation has not been validated in all clinical situations. eGFR's persistently <60 mL/min signify possible Chronic Kidney Disease.   . Anion gap 04/18/2017 9  5 - 15 Final  . Glucose-Capillary 04/17/2017 140* 65 - 99 mg/dL Final  . Comment 1 04/17/2017 Notify RN   Final  . Comment 2 04/17/2017 Document in Chart   Final  . Glucose-Capillary 04/18/2017 97  65 - 99 mg/dL Final  . Comment 1 04/18/2017 Notify RN   Final  . Comment 2 04/18/2017 Document in Chart   Final  . Glucose-Capillary 04/18/2017 133* 65 - 99 mg/dL Final  . Comment 1 04/18/2017 Notify RN   Final  . Comment 2 04/18/2017 Document in Chart   Final  . Glucose-Capillary 04/18/2017 127* 65 - 99 mg/dL Final  . Comment 1 04/18/2017 Notify RN   Final  . Comment 2 04/18/2017 Document in Chart   Final  . Glucose-Capillary 04/18/2017 141* 65 - 99 mg/dL Final    Dg Chest 2 View  Result Date: 05/08/2017 CLINICAL DATA:  Shortness of breath EXAM: CHEST  2 VIEW COMPARISON:  05/03/2017 FINDINGS: Bilateral mild interstitial thickening. No pleural effusion or pneumothorax. No focal consolidation. Stable cardiomegaly. No acute osseous abnormality. IMPRESSION: Cardiomegaly with mild pulmonary vascular congestion. Electronically Signed   By: Kathreen Devoid   On: 05/08/2017 13:01   Dg  Chest 2 View  Result Date: 05/03/2017 CLINICAL DATA:  Low-grade fever.  Recent pneumonia. EXAM: CHEST  2 VIEW COMPARISON:  04/13/2017 FINDINGS: Moderate cardiomegaly and chronic pulmonary vascular congestion are stable in appearance. No evidence of acute infiltrate or pleural effusion. IMPRESSION: Stable cardiomegaly and chronic pulmonary vascular congestion. No acute findings . Electronically Signed   By: Earle Gell M.D.   On: 05/03/2017 18:45     Assessment/Plan   ICD-10-CM   1. Deep vein thrombosis (DVT) of other vein of left lower extremity, unspecified chronicity (HCC) I82.492    saphenous vein  2. Chronic respiratory failure with hypoxia (HCC) J96.11   3. Type 2 diabetes mellitus with diabetic polyneuropathy, without long-term current use of insulin (HCC) E11.42   4. Paroxysmal atrial fibrillation (HCC) I48.0   5. Chronic combined systolic and diastolic congestive heart failure (HCC) I50.42   6. Morbid obesity ([place]) E66.01   7. Anemia, chronic disease D63.8   8. OSA on CPAP G47.33    Z99.89   9. Current use of long term anticoagulation Z79.01      INR 2.9 today. D/c lovenox and cont warfarin at current dose. Repeat INR on 8/22.  F/u with specialists as scheduled  Cont PT/OT as ordered  [place] O2 ATC  Cont CPAP qHS  GOAL: short term rehab and d/c home when medically appropriate. Communicated with pt and nursing.  Will follow  Caydyn Sprung S. Perlie Gold  [facility] [address] [place], [place] [address] [phone] Cell (Monday-Friday 8 AM - 5 PM) [phone] After 5 PM and follow prompts

## 2017-05-16 LAB — POCT INR: INR: 3.3 — AB (ref 0.9–1.1)

## 2017-05-16 LAB — PROTIME-INR: PROTIME: 32.5 — AB (ref 10.0–13.8)

## 2017-05-18 LAB — POCT INR: INR: 2.7 — AB (ref 0.9–1.1)

## 2017-05-18 LAB — PROTIME-INR: Protime: 27.7 — AB (ref 10.0–13.8)

## 2017-05-29 ENCOUNTER — Encounter: Payer: Self-pay | Admitting: Adult Health

## 2017-05-29 ENCOUNTER — Non-Acute Institutional Stay (SKILLED_NURSING_FACILITY): Payer: Medicare Other | Admitting: Adult Health

## 2017-05-29 DIAGNOSIS — I824Y2 Acute embolism and thrombosis of unspecified deep veins of left proximal lower extremity: Secondary | ICD-10-CM | POA: Diagnosis not present

## 2017-05-29 DIAGNOSIS — Z7901 Long term (current) use of anticoagulants: Secondary | ICD-10-CM | POA: Diagnosis not present

## 2017-05-29 DIAGNOSIS — Z5181 Encounter for therapeutic drug level monitoring: Secondary | ICD-10-CM | POA: Diagnosis not present

## 2017-05-29 DIAGNOSIS — I82412 Acute embolism and thrombosis of left femoral vein: Secondary | ICD-10-CM

## 2017-05-29 LAB — POCT INR: INR: 3.3 — AB (ref 0.9–1.1)

## 2017-05-29 LAB — PROTIME-INR: PROTIME: 32.7 — AB (ref 10.0–13.8)

## 2017-05-29 NOTE — Progress Notes (Signed)
Location:   [place] Room Number: 118 A Place of Service:  SNF (31)   CODE STATUS: Full Code  No Known Allergies  Chief Complaint  Patient presents with  . Acute Visit    PT / INR results    HPI:  He is on long term coumadin for left lower extremity is on coumadin long term. There are no reports of missed doses. He tells me that he is feeling good; denies any bleeding; no pain in his lower extremity; no chest pain.     Past Medical History:  Diagnosis Date  . Asthma   . CHF (congestive heart failure) ([place])   . COPD (chronic obstructive pulmonary disease) ([place])   . Diabetes mellitus without complication ([place])   . Hypertension     Past Surgical History:  Procedure Laterality Date  . SHOULDER SURGERY      Social History   Social History  . Marital status: Single    Spouse name: N/A  . Number of children: N/A  . Years of education: N/A   Occupational History  . Not on file.   Social History Main Topics  . Smoking status: Never Smoker  . Smokeless tobacco: Never Used  . Alcohol use No  . Drug use: No  . Sexual activity: Not on file   Other Topics Concern  . Not on file   Social History Narrative  . No narrative on file   Family History  Problem Relation Age of Onset  . Diabetes Mother   . Cancer Mother   . Diabetes Father   . High blood pressure Father   . Diabetes Brother       VITAL SIGNS BP 120/75   Pulse 78   Temp (!) 97.4 F (36.3 C)   Resp 18   Ht 6' (1.829 m)   Wt (!) 351 lb 3 oz (159.3 kg)   SpO2 95%   BMI 47.63 kg/m   Patient's Medications  New Prescriptions   No medications on file  Previous Medications   ACETAMINOPHEN (TYLENOL) 500 MG TABLET    Take 1 tablet (500 mg total) by mouth every 6 (six) hours as needed for moderate pain.   AMLODIPINE (NORVASC) 5 MG TABLET    Take 5 mg by mouth daily.   CHOLECALCIFEROL (VITAMIN D) 1000 UNITS TABLET    Take 1,000 Units by mouth daily.   ENOXAPARIN (LOVENOX) 150 MG/ML  INJECTION    Inject 1.07 mLs (160 mg total) into the skin every 12 (twelve) hours. (dispense 48m syringes) Inject two (2) 831msyringes into the skin every 12 hours.   FERROUS SULFATE 325 (65 FE) MG TABLET    Take 325 mg by mouth daily.   IPRATROPIUM-ALBUTEROL (DUONEB) 0.5-2.5 (3) MG/3ML SOLN    Take 3 mLs by nebulization every 4 (four) hours as needed.   LISINOPRIL (PRINIVIL,ZESTRIL) 10 MG TABLET    Take 1 tablet (10 mg total) by mouth daily.   METFORMIN (GLUCOPHAGE) 1000 MG TABLET    Take 1,000 mg by mouth 2 (two) times daily.   MULTIPLE VITAMIN (MULTIVITAMIN WITH MINERALS) TABS TABLET    Take 1 tablet by mouth daily.   NAPROXEN SODIUM (ALEVE) 220 MG TABLET    Take 220 mg by mouth every 12 (twelve) hours as needed (pain).   OXYGEN    Inhale 5 L into the lungs continuous.   PANTOPRAZOLE (PROTONIX) 40 MG TABLET    Take 1 tablet (40 mg total) by mouth daily.  PRESCRIPTION MEDICATION    Inhale into the lungs as needed (whenever sleeping - naps or at night). CPAP   TORSEMIDE (DEMADEX) 20 MG TABLET    Take 40 mg by mouth at bedtime.   WARFARIN (COUMADIN) 10 MG TABLET    Take 10 mg by mouth daily. Give along with Warfarin 54m to equal 121mdaiy   WARFARIN (COUMADIN) 4 MG TABLET    Take 4 mg by mouth daily. Give 1 tablet by mouth daily along with the 1075mo equal 54m34mtal  Modified Medications   No medications on file  Discontinued Medications   WARFARIN (COUMADIN) 1 MG TABLET    Take 1 mg by mouth every evening. Give along with 15mg63mequal 16mg 66mRFARIN (COUMADIN) 2.5 MG TABLET    Take 6 tablets (15 mg total) by mouth daily. MUST have INR check 8/17 and 8/20 to manage lovenox/warfarin bridge with pharmacy management     SIGNIFICANT DIAGNOSTIC EXAMS   PREVIOUS:   04-13-17: ct angio of chest: Limited evaluation with suboptimal contrast opacification of the bilateral pulmonary arteries. No evidence of central or lobar pulmonary embolism. Segmental or subsegmental pulmonary embolism cannot  be excluded. Mild patchy opacity in the posterior right upper lobe, possibly reflecting mild pneumonia. 7 mm right lower lobe pulmonary nodule. Non-contrast chest CT at 6-12 months is recommended  04-14-17: venous doppler: - Technically limited due to body habitus. - No evidence of deep vein thrombosis involving the visualized veins of the right lower extremity. - Findings consistent with acute deep vein thrombosis involving the left femoral vein and left popliteal vein. - . Incidental findings are consistent with: Large Baker&'s Cyst on  the right. - No evidence of Baker&'s cyst on the left.   04-15-17: 2-d echo: - The right ventricular systolic pressure was increased consistent with severe pulmonary hypertension. EF 60-65%- The right ventricular systolic pressure was increased consistent with severe pulmonary hypertension.  05-03-17: venous doppler: - Findings consistent with subacute deep vein thrombosis involving the femoral and popliteal veins left lower extremity. There appears to be no propagation of the previous DVT. - Unable to visualize the peroneal vein due to body habitus. - Also noted is a thrombosis of the greater saphenous vein from the ankle to the mid thigh which is new from previous study. - No evidence of Baker&'s cyst on the left.  05-08-17: chest x-ray: Cardiomegaly with mild pulmonary vascular congestion.   LABS REVIEWED: PREVIOUS    04-14-17: tsh 1.523; hgb a1c 7.3 05-03-17: wbc 12.7; hgb 15.5; hct 48.1; plt 351; glucose 111; bun 25; creat 1.33; k+ 4.1; na++ 141; ca 9.4; total bili 1.4; albumin 3.9; BNP 113.2 05-08-17: wbc 9.6; ghb 13.8; hct 43.6; mcv 87.7; plt 302; glucose 145; bun 27; creat 1.25; k+ 3.8; na++ 140; ca 9.1   TODAY:  05-16-17: INR 3.3 05-21-17: INR 3.0 05-29-17: INR 3.3    Review of Systems  Constitutional: Negative for malaise/fatigue.  Respiratory: Negative for cough and shortness of breath.   Cardiovascular: Positive for leg swelling. Negative for  chest pain and palpitations.       Left leg   Gastrointestinal: Negative for abdominal pain, constipation and heartburn.  Musculoskeletal: Positive for joint pain. Negative for back pain and myalgias.       Has bilateral knee pain   Skin: Negative.   Neurological: Negative for dizziness.       Has numbness left lower leg   Psychiatric/Behavioral: The patient is not nervous/anxious.  Physical Exam  Constitutional: He is oriented to person, place, and time. He appears well-developed and well-nourished. No distress.  Morbidly obese   Eyes: Conjunctivae are normal.  Neck: Neck supple. No JVD present. No thyromegaly present.  Cardiovascular: Normal rate, regular rhythm and intact distal pulses.   Respiratory: Effort normal. No respiratory distress. He has no wheezes.  Lung sounds diminished bilateral bases Is 02 dependent at 5L Has CPAP    GI: Soft. Bowel sounds are normal. He exhibits no distension. There is no tenderness.  Musculoskeletal: He exhibits edema.  Able to move all extremities  Has 2-3+ lower extremity edema   Lymphadenopathy:    He has no cervical adenopathy.  Neurological: He is alert and oriented to person, place, and time.  Skin: Skin is warm and dry. He is not diaphoretic.  Bilateral lower extremity discoloration   Psychiatric: He has a normal mood and affect.    ASSESSMENT/ PLAN:   1. DVT left saphenous vein and left femoral vein: is stable for INR of 3.3 will lower coumadin to 12 mg daily and will check inr in one week.   MD is aware of resident's narcotic use and is in agreement with current plan of care. We will attempt to wean resident as apropriate   Ok Edwards NP [facility]  Contact [phone] Monday through Friday 8am- 5pm  After hours call [phone]

## 2017-06-05 ENCOUNTER — Encounter: Payer: Self-pay | Admitting: Adult Health

## 2017-06-05 ENCOUNTER — Non-Acute Institutional Stay (SKILLED_NURSING_FACILITY): Payer: Medicare Other | Admitting: Adult Health

## 2017-06-05 DIAGNOSIS — J9611 Chronic respiratory failure with hypoxia: Secondary | ICD-10-CM

## 2017-06-05 DIAGNOSIS — Z5181 Encounter for therapeutic drug level monitoring: Secondary | ICD-10-CM | POA: Insufficient documentation

## 2017-06-05 DIAGNOSIS — I82412 Acute embolism and thrombosis of left femoral vein: Secondary | ICD-10-CM | POA: Diagnosis not present

## 2017-06-05 DIAGNOSIS — I824Y2 Acute embolism and thrombosis of unspecified deep veins of left proximal lower extremity: Secondary | ICD-10-CM

## 2017-06-05 DIAGNOSIS — Z7901 Long term (current) use of anticoagulants: Secondary | ICD-10-CM

## 2017-06-05 DIAGNOSIS — I5042 Chronic combined systolic (congestive) and diastolic (congestive) heart failure: Secondary | ICD-10-CM | POA: Diagnosis not present

## 2017-06-05 NOTE — Progress Notes (Signed)
Location:   [place] Room Number: 118 A Place of Service:  SNF (31)    CODE STATUS: Full Code  No Known Allergies  Chief Complaint  Patient presents with  . Discharge Note    Discharging to Home    HPI:  He is being discharge to home with home health for pt/rn. He will need a bariatric rollator and will continue his home 02 at 5L/[place]. He will need his prescriptions written and will to follow up with his primary provider. He and I have discussed his need for a sleep study upon discharge which he will setup with his PCP. He has a CPAP machine at home and will need supplies.  He had been hospitalized for left leg dvt; chf and chronic respiratory failure. He was admitted to this facility for short term rehab and is now ready for discharge.    Past Medical History:  Diagnosis Date  . Asthma   . CHF (congestive heart failure) ([place])   . COPD (chronic obstructive pulmonary disease) ([place])   . Diabetes mellitus without complication ([place])   . Hypertension     Past Surgical History:  Procedure Laterality Date  . SHOULDER SURGERY      Social History   Social History  . Marital status: Single    Spouse name: N/A  . Number of children: N/A  . Years of education: N/A   Occupational History  . Not on file.   Social History Main Topics  . Smoking status: Never Smoker  . Smokeless tobacco: Never Used  . Alcohol use No  . Drug use: No  . Sexual activity: Not on file   Other Topics Concern  . Not on file   Social History Narrative  . No narrative on file   Family History  Problem Relation Age of Onset  . Diabetes Mother   . Cancer Mother   . Diabetes Father   . High blood pressure Father   . Diabetes Brother     VITAL SIGNS BP 132/68   Pulse 82   Temp 97.8 F (36.6 C)   Resp 18   Ht 6' (1.829 m)   Wt (!) 348 lb 9.6 oz (158.1 kg)   SpO2 94%   BMI 47.28 kg/m   Patient's Medications  New Prescriptions   No medications on file  Previous  Medications   ACETAMINOPHEN (TYLENOL) 500 MG TABLET    Take 1 tablet (500 mg total) by mouth every 6 (six) hours as needed for moderate pain.   AMLODIPINE (NORVASC) 5 MG TABLET    Take 5 mg by mouth daily.   CHOLECALCIFEROL (VITAMIN D) 1000 UNITS TABLET    Take 1,000 Units by mouth daily.   FERROUS SULFATE 325 (65 FE) MG TABLET    Take 325 mg by mouth daily.   IPRATROPIUM-ALBUTEROL (DUONEB) 0.5-2.5 (3) MG/3ML SOLN    Take 3 mLs by nebulization every 4 (four) hours as needed.   LISINOPRIL (PRINIVIL,ZESTRIL) 10 MG TABLET    Take 1 tablet (10 mg total) by mouth daily.   METFORMIN (GLUCOPHAGE) 1000 MG TABLET    Take 1,000 mg by mouth 2 (two) times daily.   MULTIPLE VITAMIN (MULTIVITAMIN WITH MINERALS) TABS TABLET    Take 1 tablet by mouth daily.   NAPROXEN SODIUM (ALEVE) 220 MG TABLET    Take 220 mg by mouth every 12 (twelve) hours as needed (pain).   OXYGEN    Inhale 5 L into the lungs continuous.  PANTOPRAZOLE (PROTONIX) 40 MG TABLET    Take 1 tablet (40 mg total) by mouth daily.   PRESCRIPTION MEDICATION    Inhale into the lungs as needed (whenever sleeping - naps or at night). CPAP   TORSEMIDE (DEMADEX) 20 MG TABLET    Take 40 mg by mouth at bedtime.   WARFARIN (COUMADIN) 1 MG TABLET    Give 2  tablet by mouth daily along with the 137m to equal 119mtotal   WARFARIN (COUMADIN) 10 MG TABLET    Take 10 mg by mouth daily. Give along with Warfarin 37m15mo equal 137m55mily  Modified Medications   No medications on file  Discontinued Medications   No medications on file     SIGNIFICANT DIAGNOSTIC EXAMS   PREVIOUS:   04-13-17: ct angio of chest: Limited evaluation with suboptimal contrast opacification of the bilateral pulmonary arteries. No evidence of central or lobar pulmonary embolism. Segmental or subsegmental pulmonary embolism cannot be excluded. Mild patchy opacity in the posterior right upper lobe, possibly reflecting mild pneumonia. 7 mm right lower lobe pulmonary nodule. Non-contrast  chest CT at 6-12 months is recommended  04-14-17: venous doppler: - Technically limited due to body habitus. - No evidence of deep vein thrombosis involving the visualized veins of the right lower extremity. - Findings consistent with acute deep vein thrombosis involving the left femoral vein and left popliteal vein. - . Incidental findings are consistent with: Large Baker&'s Cyst on  the right. - No evidence of Baker&'s cyst on the left.   04-15-17: 2-d echo: - The right ventricular systolic pressure was increased consistent with severe pulmonary hypertension. EF 60-65%- The right ventricular systolic pressure was increased consistent with severe pulmonary hypertension.  05-03-17: venous doppler: - Findings consistent with subacute deep vein thrombosis involving the femoral and popliteal veins left lower extremity. There appears to be no propagation of the previous DVT. - Unable to visualize the peroneal vein due to body habitus. - Also noted is a thrombosis of the greater saphenous vein from the ankle to the mid thigh which is new from previous study. - No evidence of Baker&'s cyst on the left.  05-08-17: chest x-ray: Cardiomegaly with mild pulmonary vascular congestion.  NO NEW EXAMS   LABS REVIEWED: PREVIOUS    04-14-17: tsh 1.523; hgb a1c 7.3 05-03-17: wbc 12.7; hgb 15.5; hct 48.1; plt 351; glucose 111; bun 25; creat 1.33; k+ 4.1; na++ 141; ca 9.4; total bili 1.4; albumin 3.9; BNP 113.2 05-08-17: wbc 9.6; ghb 13.8; hct 43.6; mcv 87.7; plt 302; glucose 145; bun 27; creat 1.25; k+ 3.8; na++ 140; ca 9.1   05-16-17: INR 3.3 05-21-17: INR 3.0 05-29-17: INR 3.3  NO NEW LABS    Review of Systems  Constitutional: Negative for malaise/fatigue.  Respiratory: Negative for cough and shortness of breath.   Cardiovascular: Positive for leg swelling. Negative for chest pain and palpitations.       Left leg is improved   Gastrointestinal: Negative for abdominal pain, constipation and heartburn.    Musculoskeletal: Negative for back pain, joint pain and myalgias.          Skin: Negative.   Neurological: Negative for dizziness.       Has numbness left lower leg   Psychiatric/Behavioral: The patient is not nervous/anxious.    Physical Exam  Constitutional: He is oriented to person, place, and time. He appears well-developed and well-nourished. No distress.  Morbidly obese   Eyes: Conjunctivae are normal.  Neck: Neck supple.  No JVD present. No thyromegaly present.  Cardiovascular: Normal rate, regular rhythm and intact distal pulses.   Respiratory: Effort normal. No respiratory distress. He has no wheezes.  Lung sounds diminished bilateral bases Is 02 dependent at 5L Has CPAP    GI: Soft. Bowel sounds are normal. He exhibits no distension. There is no tenderness.  Musculoskeletal: He exhibits edema.  Able to move all extremities  Is ambulating with a rollator  Has 2+ lower extremity edema   Lymphadenopathy:    He has no cervical adenopathy.  Neurological: He is alert and oriented to person, place, and time.  Skin: Skin is warm and dry. He is not diaphoretic.  Bilateral lower extremity discoloration   Psychiatric: He has a normal mood and affect.   ASSESSMENT/ PLAN:  Patient is being discharged with the following home health services:  Pt/rn to evaluate and treat as indicated for gait and strength; medication and coumadin management.   Patient is being discharged with the following durable medical equipment:  Bariatric rollator in order to allow him to maintain his current level of independence with his adls  Also needs CPAP supplies  He will require a portable oxygen concentrator at 5L/[place] for chronic respiratory failure with hypoxia  Patient has been advised to f/u with their PCP in 1-2 weeks to bring them up to date on their rehab stay.  Social services at facility was responsible for arranging this appointment.  Pt was provided with a 30 day supply of prescriptions for  medications and refills must be obtained from their PCP.  For controlled substances, a more limited supply may be provided adequate until PCP appointment only.  A thirty day supply of his non-narcotic prescriptions have been written per the medication list above.  No narcotic prescriptions written.    40 minutes spent with discharge process; educating patient regarding what to expect upon discharge; provider follow up and prescriptions; has verbalized understanding.     Ok Edwards NP [facility]  Contact [phone] Monday through Friday 8am- 5pm  After hours call [phone]

## 2017-06-14 ENCOUNTER — Telehealth: Payer: Self-pay | Admitting: *Deleted

## 2017-06-14 NOTE — Telephone Encounter (Signed)
Cam Hai, RN with [facility] left message on nurse line wanting to know if Dr. Mingo Amber will be ordering provider for home health services.  Please give her a call at [phone].  Derl Barrow, RN

## 2017-06-15 NOTE — Telephone Encounter (Signed)
I will not.  It looks like the patient's PCP is Dr. Criss Rosales.  The patient was recently at a SNF.  We need to see him to see if he qualifies for any home health services.

## 2017-06-15 NOTE — Telephone Encounter (Signed)
Roberto [name] called back and wanted to know if we received the message.  Informed that we have, she is sending someone out today and report back to [place].  Pt has appt on 9/26 with Dr. Criss Rosales.  Roberto [name], Salome Spotted, CMA

## 2017-06-19 NOTE — Telephone Encounter (Signed)
Roberto [name] wanted to let Dr. [name] know that she will be going out to see patient on 06/21/17.  He has been going back and forth between [place] and [place], so they were having trouble reaching him. [place], Salome Spotted, CMA

## 2017-06-20 ENCOUNTER — Ambulatory Visit (INDEPENDENT_AMBULATORY_CARE_PROVIDER_SITE_OTHER): Payer: Medicare Other | Admitting: Family Medicine

## 2017-06-20 ENCOUNTER — Encounter: Payer: Self-pay | Admitting: Family Medicine

## 2017-06-20 VITALS — BP 120/68 | HR 47 | Temp 98.7°F | Ht 72.0 in | Wt 373.2 lb

## 2017-06-20 DIAGNOSIS — L0291 Cutaneous abscess, unspecified: Secondary | ICD-10-CM

## 2017-06-20 DIAGNOSIS — J9611 Chronic respiratory failure with hypoxia: Secondary | ICD-10-CM

## 2017-06-20 DIAGNOSIS — Z7901 Long term (current) use of anticoagulants: Secondary | ICD-10-CM | POA: Diagnosis not present

## 2017-06-20 DIAGNOSIS — R911 Solitary pulmonary nodule: Secondary | ICD-10-CM | POA: Insufficient documentation

## 2017-06-20 DIAGNOSIS — I10 Essential (primary) hypertension: Secondary | ICD-10-CM | POA: Diagnosis not present

## 2017-06-20 DIAGNOSIS — Z5181 Encounter for therapeutic drug level monitoring: Secondary | ICD-10-CM | POA: Diagnosis not present

## 2017-06-20 LAB — POCT INR: INR: 2.9

## 2017-06-20 MED ORDER — VITAMIN D 1000 UNITS PO TABS: 1000.0000 [IU] | ORAL_TABLET | Freq: Every day | ORAL | 0 refills | Status: AC

## 2017-06-20 MED ORDER — TORSEMIDE 20 MG PO TABS: 40.0000 mg | ORAL_TABLET | Freq: Every day | ORAL | 0 refills | Status: DC

## 2017-06-20 MED ORDER — MUPIROCIN 2 % EX OINT: 1.0000 "application " | TOPICAL_OINTMENT | Freq: Two times a day (BID) | CUTANEOUS | 2 refills | Status: DC

## 2017-06-20 NOTE — Progress Notes (Signed)
Subjective:  Roberto [name] is a 56 y.o. male who presents to the [facility] today with a chief complaint of "boils" in his skin folds.   He is also here to discuss his DVT/anticoagulation/oxygen/and lung nodule as a hospital followup.  His "boils" are  Without cellulitis and they drain when he pops them.   They have been a recurrent issue in his skin folds since he started losing weight.   He has not been febrile and they are not warm and do not seem to have a large accumulation.   He has had I/D performed in the past which he said helped if they don't pop.  He has not had medical attention for these latest lesions.  He has been taking his coumadin as prescribed per him although we do not have acces to an INR checks since 9/4 (3.3) and today's was 2.9.   He states his leg had an ultrasound and they told him the clot was gone.   He think his leg looks better and he is much more mobile than during his hospitalization.   He has good sensation and motor control.  He has been hindered in his mobility by the size of the oxygen tank he carries and is requesting a portable pump due to his oxygen demand.   Mobility is very important for his weight loss goals and performance of daily activities.   He desaturates without his oxygen.  He understands the plan to have a repeat CT for his lung nodule.   He is concerned given his complicated health history but agrees with the intention to repeat in 52month to check for progression of the nodule.   He will note it in his calendar in case [place]   Objective:  Physical Exam: BP 120/68 (BP Location: Right Arm, Patient Position: Sitting, Cuff Size: Large)   Pulse (!) 47   Temp 98.7 F (37.1 C) (Oral)   Ht 6' (1.829 m)   Wt (!) 373 lb 3.2 oz (169.3 kg)   BMI 50.62 kg/m   Gen: NAD, resting comfortably but needing 5L oxygen by nasal cannula to do so CV: irregular rhythm with rate variable but <100 Pulm: increased WOB, 5L nasal canula,  diffuse chronic wheezing to auscultation MSK: legs are now assymetric with reduced discoloration in lower L leg from DVT.   Patient able to walk with just a cane. Skin: warm, dry.  In terms of the abscess: 2 under belly fold, both open and draining with no notable cellulitis, 1 on lower R scrotum, open and draining with no cellulitis Neuro: grossly normal, moves all extremities Psych: Normal affect and thought content  Results for orders placed or performed in visit on 06/20/17 (from the past 72 hour(s))  POCT INR     Status: None   Collection Time: 06/20/17  2:48 PM  Result Value Ref Range   INR 2.9      Assessment/Plan:  Solitary pulmonary nodule Patient instructed to make a note in his calendar to followup on nodule in case he leaves town again or changes doctors.  Anticoagulation management encounter Patient [facility], current plan is continue warfarin 190mdaily with followup in 1 wk.  Essential hypertension Torsemide refilled  Chronic respiratory failure with hypoxia (HCC) Portable oxygen pumped ordered due to size of current tank inhibiting mobility  Abscess 2 under belly fold, both open and draining with no notable cellulitis 1 on lower  R scrotum, open and draining with no cellulitis  Plan is muprocin ointment with more regular self-care   Sherene Sires, [place] - PGY1 06/20/2017 3:35 PM

## 2017-06-20 NOTE — Assessment & Plan Note (Signed)
Patient connected with [name] coumadin clinic, current plan is continue warfarin 49m daily with followup in 1 wk.

## 2017-06-20 NOTE — Assessment & Plan Note (Signed)
Patient instructed to make a note in his calendar to followup on nodule in case he leaves town again or changes doctors.

## 2017-06-20 NOTE — Assessment & Plan Note (Signed)
2 under belly fold, both open and draining with no notable cellulitis 1 on lower R scrotum, open and draining with no cellulitis  Plan is muprocin ointment with more regular self-care

## 2017-06-20 NOTE — Patient Instructions (Addendum)
It was a pleasure to see you today! Thank you for choosing [facility] for your primary care. Roberto [name] was seen for DVT/abscess/chronic respiratory failure and HTN. Come back to the clinic if your leg symptoms get worse your abscess begin to get redness around them or your breathing deteriorates , and go to the emergency room if you have any life threatening symptoms.   We have given you some muprocin ointment for your abscesses, refilled your torsemide, checked your INR and got you connected with our coumadin clinic and ordered a battery powered oxygen pump for you.  If we did any lab work today, and the results require attention, either me or my nurse will get in touch with you. If everything is normal, you will get a letter in mail and a message via . If you don't hear from [place] in two weeks, please give [place] a call. Otherwise, we look forward to seeing you again at your next visit. If you have any questions or concerns before then, please call the clinic at [phone].  Please bring all your medications to every doctors visit  Sign up for My Chart to have easy access to your labs results, and communication with your Primary care physician.    Please check-out at the front desk before leaving the clinic.    Best,  Dr. Sherene Sires FAMILY MEDICINE RESIDENT - PGY1 06/20/2017 2:31 PM

## 2017-06-20 NOTE — Assessment & Plan Note (Signed)
Torsemide refilled

## 2017-06-20 NOTE — Assessment & Plan Note (Signed)
Portable oxygen pumped ordered due to size of current tank inhibiting mobility

## 2017-06-27 ENCOUNTER — Ambulatory Visit: Payer: Medicare Other

## 2017-07-10 ENCOUNTER — Ambulatory Visit (INDEPENDENT_AMBULATORY_CARE_PROVIDER_SITE_OTHER): Payer: Medicare Other | Admitting: *Deleted

## 2017-07-10 DIAGNOSIS — Z7901 Long term (current) use of anticoagulants: Secondary | ICD-10-CM | POA: Diagnosis not present

## 2017-07-10 LAB — POCT INR: INR: 2.8

## 2017-08-06 ENCOUNTER — Other Ambulatory Visit: Payer: Self-pay | Admitting: Family Medicine

## 2017-08-06 MED ORDER — WARFARIN SODIUM 10 MG PO TABS: 10.0000 mg | ORAL_TABLET | Freq: Every day | ORAL | 0 refills | Status: DC

## 2017-08-06 MED ORDER — WARFARIN SODIUM 1 MG PO TABS: 2.0000 mg | ORAL_TABLET | Freq: Every day | ORAL | 0 refills | Status: DC

## 2017-08-06 NOTE — Telephone Encounter (Signed)
Pt is calling because he has been waiting since last week to get a refill on his Warfarin. He is out and he does not want to be out of this medication. jw

## 2017-08-07 ENCOUNTER — Ambulatory Visit: Payer: Medicare Other

## 2017-08-10 ENCOUNTER — Ambulatory Visit: Payer: Medicare Other

## 2017-08-14 ENCOUNTER — Ambulatory Visit: Payer: Medicare Other

## 2017-08-28 DIAGNOSIS — M170 Bilateral primary osteoarthritis of knee: Secondary | ICD-10-CM | POA: Diagnosis not present

## 2017-08-30 DIAGNOSIS — R0600 Dyspnea, unspecified: Secondary | ICD-10-CM | POA: Diagnosis not present

## 2017-08-30 DIAGNOSIS — I509 Heart failure, unspecified: Secondary | ICD-10-CM | POA: Diagnosis not present

## 2017-08-31 ENCOUNTER — Ambulatory Visit (INDEPENDENT_AMBULATORY_CARE_PROVIDER_SITE_OTHER): Payer: Medicare HMO | Admitting: *Deleted

## 2017-08-31 ENCOUNTER — Other Ambulatory Visit: Payer: Self-pay | Admitting: *Deleted

## 2017-08-31 DIAGNOSIS — Z7901 Long term (current) use of anticoagulants: Secondary | ICD-10-CM

## 2017-08-31 LAB — POCT INR: INR: 2.8

## 2017-08-31 MED ORDER — WARFARIN SODIUM 10 MG PO TABS: 10.0000 mg | ORAL_TABLET | Freq: Every day | ORAL | 0 refills | Status: DC

## 2017-09-11 ENCOUNTER — Telehealth: Payer: Self-pay | Admitting: Family Medicine

## 2017-09-11 NOTE — Telephone Encounter (Signed)
Pt wants to change the company that supplies his oxygen.  He wants to change from [employer] to [employer] in [place].  Please advise

## 2017-09-14 NOTE — Telephone Encounter (Signed)
Following message received from Darlina Guys at [facility]:  This patient has been with [employer] for too long at this point.  We can take a transition O2 patient if not very many payments have been made to the previous DME company but [employer] has already received 20 payments for this one.  Medicare reimburses for 36 months out the 5 year certification period.  Sorry we couldn't help with this one. Let me know if you have questions.

## 2017-09-28 ENCOUNTER — Ambulatory Visit: Payer: Medicare Other

## 2017-09-30 DIAGNOSIS — R0600 Dyspnea, unspecified: Secondary | ICD-10-CM | POA: Diagnosis not present

## 2017-09-30 DIAGNOSIS — I509 Heart failure, unspecified: Secondary | ICD-10-CM | POA: Diagnosis not present

## 2017-10-26 ENCOUNTER — Encounter: Payer: Medicare Other | Admitting: Family Medicine

## 2017-10-29 ENCOUNTER — Encounter: Payer: Medicare Other | Admitting: Family Medicine

## 2017-10-31 DIAGNOSIS — R0600 Dyspnea, unspecified: Secondary | ICD-10-CM | POA: Diagnosis not present

## 2017-10-31 DIAGNOSIS — I509 Heart failure, unspecified: Secondary | ICD-10-CM | POA: Diagnosis not present

## 2017-11-01 ENCOUNTER — Other Ambulatory Visit: Payer: Self-pay | Admitting: Family Medicine

## 2017-11-05 ENCOUNTER — Telehealth (HOSPITAL_COMMUNITY): Payer: Self-pay

## 2017-11-05 ENCOUNTER — Encounter: Payer: Self-pay | Admitting: Family Medicine

## 2017-11-05 ENCOUNTER — Other Ambulatory Visit: Payer: Self-pay

## 2017-11-05 ENCOUNTER — Ambulatory Visit (INDEPENDENT_AMBULATORY_CARE_PROVIDER_SITE_OTHER): Payer: Medicare HMO | Admitting: Family Medicine

## 2017-11-05 VITALS — BP 122/70 | HR 83 | Temp 98.7°F | Ht 72.0 in | Wt 373.0 lb

## 2017-11-05 DIAGNOSIS — I824Y2 Acute embolism and thrombosis of unspecified deep veins of left proximal lower extremity: Secondary | ICD-10-CM | POA: Diagnosis not present

## 2017-11-05 DIAGNOSIS — E1142 Type 2 diabetes mellitus with diabetic polyneuropathy: Secondary | ICD-10-CM

## 2017-11-05 DIAGNOSIS — J9611 Chronic respiratory failure with hypoxia: Secondary | ICD-10-CM

## 2017-11-05 DIAGNOSIS — I5042 Chronic combined systolic (congestive) and diastolic (congestive) heart failure: Secondary | ICD-10-CM | POA: Diagnosis not present

## 2017-11-05 LAB — POCT GLYCOSYLATED HEMOGLOBIN (HGB A1C): Hemoglobin A1C: 6.4

## 2017-11-05 LAB — POCT INR: INR: 2.6

## 2017-11-05 NOTE — Assessment & Plan Note (Signed)
Prescribed cardiac/pulmonary rehab.   Also will attempt to reach advance home care due to patient saying they needed something from [place] for documentation.

## 2017-11-05 NOTE — Assessment & Plan Note (Signed)
Discussed pulmmonary/cardiac rehab with patient as he has multiple comorbidities but has been trying to exercise.   Will send referal

## 2017-11-05 NOTE — Patient Instructions (Addendum)
It was a pleasure to see you today! Thank you for choosing [facility] for your primary care. Roberto [name] was seen for a physical and to track A1C/INR. Come back to the clinic if you have any worsening symptoms, and go to the emergency room if you have any life threatening concerns.   Today we talked about pulmonary rehab and continuing to take your metformin even though your A1C is do so much better.   We'll also do some basic lab work on your next visit to check your kidney function.  If we did any lab work today, and the results require attention, either me or my nurse will get in touch with you. If everything is normal, you will get a letter in mail and a message via . If you don't hear from [place] in two weeks, please give [place] a call. Otherwise, we look forward to seeing you again at your next visit. If you have any questions or concerns before then, please call the clinic at [phone].  Please bring all your medications to every doctors visit  Sign up for My Chart to have easy access to your labs results, and communication with your Primary care physician.    Please check-out at the front desk before leaving the clinic.    Best,  Dr. Sherene Sires FAMILY MEDICINE RESIDENT - PGY1 11/05/2017 11:22 AM

## 2017-11-05 NOTE — Telephone Encounter (Signed)
Patients insurance is active and benefits verified through [facility] - $10.00 co-pay, no deductible, out of pocket amount of $3,400/$35.02 has been met, no co-insurance, and no pre-authorization is required. Passport/reference [id number]  Patient will be contacted and scheduled after review by the RN Navigator.

## 2017-11-05 NOTE — Progress Notes (Signed)
    Subjective:  Roberto [name] is a 57 y.o. male who presents to the [facility] today with a chief complaint of physical and checking A1C/INR.   HPI: Patient is on coumadin after DVT w/ failed xarelto treatment.  His INR has been in therapuetic range for last few checks at a consistent warfarin dose of 12 daily and patient is consistent with this medication.  His A1C has been significantly imporved to 6.4 from 7.3.   He has actually been not taking his metformin for ~52month after he noticed his morning and evening cbgs were low 100s to 80s and he got nervous about low blood sugar.   He has been watching his diet and trying to exercise.  His weight has still been a concern and he's up 6lbs from his last check.   Wediscussed the possibility of cardiac/pulmonary rehab and he is interested.  Objective:  Physical Exam: BP 122/70   Pulse 83   Temp 98.7 F (37.1 C) (Oral)   Ht 6' (1.829 m)   Wt (!) 373 lb (169.2 kg)   SpO2 (!) 88% Comment: on 4 liters of oxygen  BMI 50.59 kg/m   Gen: NAD, slightly increased WOB on 5L O2nc, obese CV: RRR with systolic murmur Pulm: slightly IWOB, CTAB with no crackles, wheezes, or rhonchi GI: Soft, Nontender, Nondistended, obese MSK: no edema, cyanosis, or clubbing noted Skin: warm, dry Legs: with chronic stasis discoloration, no edema, no pain to compression, distal sensation/motor control intact Neuro: grossly normal, moves all extremities Psych: Normal affect and thought content  Results for orders placed or performed in visit on 11/05/17 (from the past 72 hour(s))  POCT glycosylated hemoglobin (Hb A1C)     Status: None   Collection Time: 11/05/17 11:32 AM  Result Value Ref Range   Hemoglobin A1C 6.4   POCT INR     Status: None   Collection Time: 11/05/17 11:37 AM  Result Value Ref Range   INR 2.6      Assessment/Plan:  Combined congestive systolic and diastolic heart failure (HCC) Discussed pulmmonary/cardiac rehab with patient as he has multiple  comorbidities but has been trying to exercise.   Will send referal  [place] with [place] Prescribed cardiac/pulmonary rehab.   Also will attempt to reach advance home care due to patient saying they needed [place].  Acute deep vein thrombosis (DVT) of proximal vein of left lower extremity (HCC) INR therapeutic and stable for last 5 checks per lab.   Will continue 4wk checks.   Might consider spacing checks further after 661monthyear of normal numbers   ScSherene SiresDO [place] PGY1 11/05/2017 4:00 PM

## 2017-11-05 NOTE — Assessment & Plan Note (Signed)
INR therapeutic and stable for last 5 checks per lab.   Will continue 4wk checks.   Might consider spacing checks further after 65month-year of normal numbers

## 2017-11-07 ENCOUNTER — Encounter: Payer: Self-pay | Admitting: Family Medicine

## 2017-11-07 NOTE — Progress Notes (Signed)
Patient has been consistent with their 5L [place] during the day (needs portable tank) and their CPAP via mask at night  He benefits from, and requires, continuing use of these therapies.  Dr. Criss Rosales, DO

## 2017-11-08 ENCOUNTER — Other Ambulatory Visit (HOSPITAL_COMMUNITY): Payer: Self-pay

## 2017-11-08 DIAGNOSIS — J9611 Chronic respiratory failure with hypoxia: Secondary | ICD-10-CM

## 2017-11-08 NOTE — Addendum Note (Signed)
Addended by: Coral Else on: 11/08/2017 08:47 AM   Modules accepted: Orders

## 2017-11-14 ENCOUNTER — Telehealth (HOSPITAL_COMMUNITY): Payer: Self-pay

## 2017-11-14 NOTE — Telephone Encounter (Signed)
Attempted to call patient in regards to Pulmonary Rehab - lm on vm °

## 2017-11-14 NOTE — Telephone Encounter (Signed)
Patients Insurance is active and benefits verified through [facility] - $10.00 co-pay, no deductible, out of pocket amount of $3,400/$35.02 has been met, no co-insurance, and no pre-authorization is required. Spoke with [facility] - Reference [id number]

## 2017-11-15 ENCOUNTER — Telehealth (HOSPITAL_COMMUNITY): Payer: Self-pay

## 2017-11-15 NOTE — Telephone Encounter (Signed)
Patient returned phone call - Patient is interested in the Program. Scheduled orientation on 12/07/2017 at 9:30am. Patient will attend the 1:30pm exc class. Went over insurance with patient and patient stated he understands what he would be responsible for.

## 2017-11-28 DIAGNOSIS — R0600 Dyspnea, unspecified: Secondary | ICD-10-CM | POA: Diagnosis not present

## 2017-11-28 DIAGNOSIS — I509 Heart failure, unspecified: Secondary | ICD-10-CM | POA: Diagnosis not present

## 2017-12-07 ENCOUNTER — Telehealth (HOSPITAL_COMMUNITY): Payer: Self-pay

## 2017-12-07 ENCOUNTER — Inpatient Hospital Stay (HOSPITAL_COMMUNITY): Admission: RE | Admit: 2017-12-07 | Payer: Medicare HMO | Source: Ambulatory Visit

## 2017-12-13 ENCOUNTER — Telehealth (HOSPITAL_COMMUNITY): Payer: Self-pay

## 2017-12-13 NOTE — Telephone Encounter (Signed)
2nd attempt to call patient to reschedule Orientation - Per Truddie Crumble if no call back close referral.

## 2017-12-14 ENCOUNTER — Telehealth (HOSPITAL_COMMUNITY): Payer: Self-pay

## 2017-12-14 NOTE — Telephone Encounter (Signed)
Patient returned phone call in regards to Pulmonary Rehab and rescheduling orientation. Reschedule orientation to 01/25/2018 at 1:30pm. Patient will attend the 10:30am exc class. Patient still has packet that was mailed previously.

## 2017-12-29 DIAGNOSIS — I509 Heart failure, unspecified: Secondary | ICD-10-CM | POA: Diagnosis not present

## 2017-12-29 DIAGNOSIS — R0600 Dyspnea, unspecified: Secondary | ICD-10-CM | POA: Diagnosis not present

## 2018-01-14 ENCOUNTER — Telehealth: Payer: Self-pay | Admitting: Family Medicine

## 2018-01-14 ENCOUNTER — Telehealth (HOSPITAL_COMMUNITY): Payer: Self-pay

## 2018-01-14 ENCOUNTER — Ambulatory Visit: Payer: Medicare HMO | Admitting: Family Medicine

## 2018-01-14 DIAGNOSIS — Z1211 Encounter for screening for malignant neoplasm of colon: Secondary | ICD-10-CM

## 2018-01-14 NOTE — Telephone Encounter (Signed)
Patient needs to cancel Pulmonary Rehab orientation appointment (01/16/18) due to financial reasons--he was living with daughter (is no longer). Currently living out of hotels until he can get into an apartment. Does not have the gas money to drive to [place]. He does want to get started but wont be able to financially until he gets into the apartment.

## 2018-01-14 NOTE — Telephone Encounter (Signed)
Patient was on schedule with only reason for visit to order colonoscopy.  I callled him and since that was his only complaint we are ordering it and cancelling his visit for today.  -Dr. Criss Rosales

## 2018-01-14 NOTE — Telephone Encounter (Signed)
Placed colonoscopy information in the mail for pt so he can call and set up his colonoscopy screening. Katharina Caper, Dannisha Eckmann D, [place]

## 2018-01-16 ENCOUNTER — Ambulatory Visit (HOSPITAL_COMMUNITY): Payer: Medicare HMO

## 2018-01-20 ENCOUNTER — Inpatient Hospital Stay (HOSPITAL_COMMUNITY)
Admission: EM | Admit: 2018-01-20 | Discharge: 2018-01-26 | DRG: 291 | Disposition: A | Discharge status: Home / Self Care | Payer: Medicare HMO | Attending: Family Medicine | Admitting: Family Medicine

## 2018-01-20 ENCOUNTER — Other Ambulatory Visit: Payer: Self-pay

## 2018-01-20 ENCOUNTER — Emergency Department (HOSPITAL_COMMUNITY): Payer: Medicare HMO

## 2018-01-20 ENCOUNTER — Encounter (HOSPITAL_COMMUNITY): Payer: Self-pay | Admitting: Emergency Medicine

## 2018-01-20 DIAGNOSIS — E1142 Type 2 diabetes mellitus with diabetic polyneuropathy: Secondary | ICD-10-CM | POA: Diagnosis present

## 2018-01-20 DIAGNOSIS — M545 Low back pain: Secondary | ICD-10-CM | POA: Diagnosis present

## 2018-01-20 DIAGNOSIS — Z6841 Body Mass Index (BMI) 40.0 and over, adult: Secondary | ICD-10-CM

## 2018-01-20 DIAGNOSIS — I482 Chronic atrial fibrillation: Secondary | ICD-10-CM | POA: Diagnosis present

## 2018-01-20 DIAGNOSIS — I110 Hypertensive heart disease with heart failure: Principal | ICD-10-CM | POA: Diagnosis present

## 2018-01-20 DIAGNOSIS — I4891 Unspecified atrial fibrillation: Secondary | ICD-10-CM | POA: Diagnosis not present

## 2018-01-20 DIAGNOSIS — R911 Solitary pulmonary nodule: Secondary | ICD-10-CM | POA: Diagnosis present

## 2018-01-20 DIAGNOSIS — I5043 Acute on chronic combined systolic (congestive) and diastolic (congestive) heart failure: Secondary | ICD-10-CM | POA: Diagnosis not present

## 2018-01-20 DIAGNOSIS — E119 Type 2 diabetes mellitus without complications: Secondary | ICD-10-CM | POA: Diagnosis present

## 2018-01-20 DIAGNOSIS — J449 Chronic obstructive pulmonary disease, unspecified: Secondary | ICD-10-CM | POA: Diagnosis present

## 2018-01-20 DIAGNOSIS — J9621 Acute and chronic respiratory failure with hypoxia: Secondary | ICD-10-CM | POA: Diagnosis not present

## 2018-01-20 DIAGNOSIS — I509 Heart failure, unspecified: Secondary | ICD-10-CM

## 2018-01-20 DIAGNOSIS — M7989 Other specified soft tissue disorders: Secondary | ICD-10-CM | POA: Diagnosis not present

## 2018-01-20 DIAGNOSIS — I481 Persistent atrial fibrillation: Secondary | ICD-10-CM | POA: Diagnosis not present

## 2018-01-20 DIAGNOSIS — N179 Acute kidney failure, unspecified: Secondary | ICD-10-CM | POA: Diagnosis not present

## 2018-01-20 DIAGNOSIS — Z7901 Long term (current) use of anticoagulants: Secondary | ICD-10-CM

## 2018-01-20 DIAGNOSIS — I872 Venous insufficiency (chronic) (peripheral): Secondary | ICD-10-CM | POA: Diagnosis present

## 2018-01-20 DIAGNOSIS — Z9981 Dependence on supplemental oxygen: Secondary | ICD-10-CM | POA: Diagnosis not present

## 2018-01-20 DIAGNOSIS — I4892 Unspecified atrial flutter: Secondary | ICD-10-CM | POA: Diagnosis present

## 2018-01-20 DIAGNOSIS — I248 Other forms of acute ischemic heart disease: Secondary | ICD-10-CM | POA: Diagnosis present

## 2018-01-20 DIAGNOSIS — Z86718 Personal history of other venous thrombosis and embolism: Secondary | ICD-10-CM

## 2018-01-20 DIAGNOSIS — Z7984 Long term (current) use of oral hypoglycemic drugs: Secondary | ICD-10-CM

## 2018-01-20 DIAGNOSIS — K219 Gastro-esophageal reflux disease without esophagitis: Secondary | ICD-10-CM | POA: Diagnosis present

## 2018-01-20 DIAGNOSIS — I4510 Unspecified right bundle-branch block: Secondary | ICD-10-CM | POA: Diagnosis present

## 2018-01-20 DIAGNOSIS — I10 Essential (primary) hypertension: Secondary | ICD-10-CM

## 2018-01-20 DIAGNOSIS — I2729 Other secondary pulmonary hypertension: Secondary | ICD-10-CM | POA: Diagnosis present

## 2018-01-20 DIAGNOSIS — Z79899 Other long term (current) drug therapy: Secondary | ICD-10-CM

## 2018-01-20 DIAGNOSIS — I361 Nonrheumatic tricuspid (valve) insufficiency: Secondary | ICD-10-CM | POA: Diagnosis not present

## 2018-01-20 DIAGNOSIS — Z8701 Personal history of pneumonia (recurrent): Secondary | ICD-10-CM

## 2018-01-20 DIAGNOSIS — Z833 Family history of diabetes mellitus: Secondary | ICD-10-CM

## 2018-01-20 DIAGNOSIS — R001 Bradycardia, unspecified: Secondary | ICD-10-CM | POA: Diagnosis not present

## 2018-01-20 DIAGNOSIS — I495 Sick sinus syndrome: Secondary | ICD-10-CM | POA: Diagnosis present

## 2018-01-20 DIAGNOSIS — G4733 Obstructive sleep apnea (adult) (pediatric): Secondary | ICD-10-CM | POA: Diagnosis present

## 2018-01-20 DIAGNOSIS — R0602 Shortness of breath: Secondary | ICD-10-CM | POA: Diagnosis not present

## 2018-01-20 DIAGNOSIS — Z86711 Personal history of pulmonary embolism: Secondary | ICD-10-CM

## 2018-01-20 DIAGNOSIS — Z8249 Family history of ischemic heart disease and other diseases of the circulatory system: Secondary | ICD-10-CM

## 2018-01-20 DIAGNOSIS — I5033 Acute on chronic diastolic (congestive) heart failure: Secondary | ICD-10-CM | POA: Diagnosis not present

## 2018-01-20 LAB — I-STAT TROPONIN, ED: TROPONIN I, POC: 0.03 ng/mL (ref 0.00–0.08)

## 2018-01-20 MED ORDER — ALBUTEROL SULFATE (2.5 MG/3ML) 0.083% IN NEBU
5.0000 mg | INHALATION_SOLUTION | Freq: Once | RESPIRATORY_TRACT | Status: AC
  Administered 2018-01-20: 5 mg via RESPIRATORY_TRACT
  Filled 2018-01-20: qty 6

## 2018-01-20 NOTE — ED Triage Notes (Signed)
Pt is a CHF patient who is on 5L O2 at home. States his legs are almost twice their size and he knows his abdomen is distended. Slight increase in shortness of breath. Normal home sats high 80s low 90s on 5L.

## 2018-01-21 ENCOUNTER — Inpatient Hospital Stay (HOSPITAL_COMMUNITY): Payer: Medicare HMO

## 2018-01-21 DIAGNOSIS — I2729 Other secondary pulmonary hypertension: Secondary | ICD-10-CM | POA: Diagnosis present

## 2018-01-21 DIAGNOSIS — I5043 Acute on chronic combined systolic (congestive) and diastolic (congestive) heart failure: Secondary | ICD-10-CM | POA: Diagnosis present

## 2018-01-21 DIAGNOSIS — I481 Persistent atrial fibrillation: Secondary | ICD-10-CM | POA: Diagnosis present

## 2018-01-21 DIAGNOSIS — R001 Bradycardia, unspecified: Secondary | ICD-10-CM | POA: Diagnosis present

## 2018-01-21 DIAGNOSIS — J9621 Acute and chronic respiratory failure with hypoxia: Secondary | ICD-10-CM | POA: Diagnosis present

## 2018-01-21 DIAGNOSIS — I509 Heart failure, unspecified: Secondary | ICD-10-CM

## 2018-01-21 DIAGNOSIS — E1142 Type 2 diabetes mellitus with diabetic polyneuropathy: Secondary | ICD-10-CM | POA: Diagnosis present

## 2018-01-21 DIAGNOSIS — E119 Type 2 diabetes mellitus without complications: Secondary | ICD-10-CM | POA: Diagnosis present

## 2018-01-21 DIAGNOSIS — I872 Venous insufficiency (chronic) (peripheral): Secondary | ICD-10-CM | POA: Diagnosis present

## 2018-01-21 DIAGNOSIS — I110 Hypertensive heart disease with heart failure: Secondary | ICD-10-CM | POA: Diagnosis present

## 2018-01-21 DIAGNOSIS — M545 Low back pain: Secondary | ICD-10-CM | POA: Diagnosis present

## 2018-01-21 DIAGNOSIS — J449 Chronic obstructive pulmonary disease, unspecified: Secondary | ICD-10-CM | POA: Diagnosis present

## 2018-01-21 DIAGNOSIS — I5033 Acute on chronic diastolic (congestive) heart failure: Secondary | ICD-10-CM | POA: Diagnosis not present

## 2018-01-21 DIAGNOSIS — I248 Other forms of acute ischemic heart disease: Secondary | ICD-10-CM | POA: Diagnosis present

## 2018-01-21 DIAGNOSIS — Z6841 Body Mass Index (BMI) 40.0 and over, adult: Secondary | ICD-10-CM | POA: Diagnosis not present

## 2018-01-21 DIAGNOSIS — I4891 Unspecified atrial fibrillation: Secondary | ICD-10-CM

## 2018-01-21 DIAGNOSIS — I4510 Unspecified right bundle-branch block: Secondary | ICD-10-CM | POA: Diagnosis present

## 2018-01-21 DIAGNOSIS — R911 Solitary pulmonary nodule: Secondary | ICD-10-CM | POA: Diagnosis present

## 2018-01-21 DIAGNOSIS — M7989 Other specified soft tissue disorders: Secondary | ICD-10-CM

## 2018-01-21 DIAGNOSIS — I361 Nonrheumatic tricuspid (valve) insufficiency: Secondary | ICD-10-CM | POA: Diagnosis not present

## 2018-01-21 DIAGNOSIS — I4892 Unspecified atrial flutter: Secondary | ICD-10-CM | POA: Diagnosis present

## 2018-01-21 DIAGNOSIS — N179 Acute kidney failure, unspecified: Secondary | ICD-10-CM | POA: Diagnosis present

## 2018-01-21 DIAGNOSIS — I482 Chronic atrial fibrillation: Secondary | ICD-10-CM | POA: Diagnosis present

## 2018-01-21 DIAGNOSIS — K219 Gastro-esophageal reflux disease without esophagitis: Secondary | ICD-10-CM | POA: Diagnosis present

## 2018-01-21 DIAGNOSIS — I495 Sick sinus syndrome: Secondary | ICD-10-CM | POA: Diagnosis present

## 2018-01-21 DIAGNOSIS — Z7901 Long term (current) use of anticoagulants: Secondary | ICD-10-CM | POA: Diagnosis not present

## 2018-01-21 DIAGNOSIS — G4733 Obstructive sleep apnea (adult) (pediatric): Secondary | ICD-10-CM | POA: Diagnosis present

## 2018-01-21 DIAGNOSIS — Z9981 Dependence on supplemental oxygen: Secondary | ICD-10-CM | POA: Diagnosis not present

## 2018-01-21 LAB — TROPONIN I
TROPONIN I: 0.03 ng/mL — AB (ref <0.03)
TROPONIN I: 0.03 ng/mL — AB (ref <0.03)

## 2018-01-21 LAB — BASIC METABOLIC PANEL
Anion gap: 7 (ref 5–15)
BUN: 22 mg/dL — AB (ref 6–20)
CALCIUM: 8.9 mg/dL (ref 8.9–10.3)
CO2: 32 mmol/L (ref 22–32)
Chloride: 101 mmol/L (ref 101–111)
Creatinine, Ser: 1.63 mg/dL — ABNORMAL HIGH (ref 0.61–1.24)
GFR calc Af Amer: 52 mL/min — ABNORMAL LOW (ref >60)
GFR calc non Af Amer: 45 mL/min — ABNORMAL LOW (ref >60)
GLUCOSE: 104 mg/dL — AB (ref 65–99)
POTASSIUM: 4.1 mmol/L (ref 3.5–5.1)
Sodium: 140 mmol/L (ref 135–145)

## 2018-01-21 LAB — CBC WITH DIFFERENTIAL/PLATELET
BASOS ABS: 0.1 10*3/uL (ref 0.0–0.1)
Basophils Relative: 1 %
EOS PCT: 3 %
Eosinophils Absolute: 0.2 10*3/uL (ref 0.0–0.7)
HEMATOCRIT: 48.1 % (ref 39.0–52.0)
Hemoglobin: 14.8 g/dL (ref 13.0–17.0)
LYMPHS PCT: 11 %
Lymphs Abs: 0.9 10*3/uL (ref 0.7–4.0)
MCH: 28.6 pg (ref 26.0–34.0)
MCHC: 30.8 g/dL (ref 30.0–36.0)
MCV: 92.9 fL (ref 78.0–100.0)
MONO ABS: 1 10*3/uL (ref 0.1–1.0)
MONOS PCT: 12 %
NEUTROS ABS: 5.9 10*3/uL (ref 1.7–7.7)
Neutrophils Relative %: 73 %
PLATELETS: 267 10*3/uL (ref 150–400)
RBC: 5.18 MIL/uL (ref 4.22–5.81)
RDW: 15.7 % — ABNORMAL HIGH (ref 11.5–15.5)
WBC: 8.1 10*3/uL (ref 4.0–10.5)

## 2018-01-21 LAB — PROTIME-INR
INR: 2.85
PROTHROMBIN TIME: 29.7 s — AB (ref 11.4–15.2)

## 2018-01-21 LAB — HEPATIC FUNCTION PANEL
ALBUMIN: 3.3 g/dL — AB (ref 3.5–5.0)
ALT: 17 U/L (ref 17–63)
AST: 23 U/L (ref 15–41)
Alkaline Phosphatase: 88 U/L (ref 38–126)
BILIRUBIN TOTAL: 1 mg/dL (ref 0.3–1.2)
Bilirubin, Direct: 0.3 mg/dL (ref 0.1–0.5)
Indirect Bilirubin: 0.7 mg/dL (ref 0.3–0.9)
Total Protein: 6.7 g/dL (ref 6.5–8.1)

## 2018-01-21 LAB — GLUCOSE, CAPILLARY
GLUCOSE-CAPILLARY: 106 mg/dL — AB (ref 65–99)
Glucose-Capillary: 143 mg/dL — ABNORMAL HIGH (ref 65–99)

## 2018-01-21 LAB — CBG MONITORING, ED
Glucose-Capillary: 89 mg/dL (ref 65–99)
Glucose-Capillary: 151 mg/dL — ABNORMAL HIGH (ref 65–99)

## 2018-01-21 LAB — TSH: TSH: 1.564 u[IU]/mL (ref 0.350–4.500)

## 2018-01-21 LAB — PHOSPHORUS: Phosphorus: 4.1 mg/dL (ref 2.5–4.6)

## 2018-01-21 LAB — BRAIN NATRIURETIC PEPTIDE: B Natriuretic Peptide: 207.5 pg/mL — ABNORMAL HIGH (ref 0.0–100.0)

## 2018-01-21 LAB — MAGNESIUM: MAGNESIUM: 2 mg/dL (ref 1.7–2.4)

## 2018-01-21 MED ORDER — SODIUM CHLORIDE 0.9% FLUSH
3.0000 mL | INTRAVENOUS | Status: DC | PRN
  Administered 2018-01-21 – 2018-01-25 (×2): 3 mL via INTRAVENOUS
  Filled 2018-01-21 (×2): qty 3

## 2018-01-21 MED ORDER — SODIUM CHLORIDE 0.9 % IV SOLN: 250.0000 mL | INTRAVENOUS | Status: DC | PRN

## 2018-01-21 MED ORDER — WARFARIN - PHYSICIAN DOSING INPATIENT
Freq: Every day | Status: DC
  Administered 2018-01-22: 1
  Administered 2018-01-24: 17:00:00

## 2018-01-21 MED ORDER — SODIUM CHLORIDE 0.9% FLUSH
3.0000 mL | Freq: Two times a day (BID) | INTRAVENOUS | Status: DC
  Administered 2018-01-21 – 2018-01-26 (×10): 3 mL via INTRAVENOUS

## 2018-01-21 MED ORDER — ACETAMINOPHEN 325 MG PO TABS
650.0000 mg | ORAL_TABLET | ORAL | Status: DC | PRN
  Administered 2018-01-22 – 2018-01-26 (×7): 650 mg via ORAL
  Filled 2018-01-21 (×7): qty 2

## 2018-01-21 MED ORDER — IPRATROPIUM-ALBUTEROL 0.5-2.5 (3) MG/3ML IN SOLN: 3.0000 mL | RESPIRATORY_TRACT | Status: DC | PRN

## 2018-01-21 MED ORDER — WARFARIN SODIUM 2 MG PO TABS
12.0000 mg | ORAL_TABLET | Freq: Every day | ORAL | Status: DC
  Administered 2018-01-21 – 2018-01-26 (×6): 12 mg via ORAL
  Filled 2018-01-21 (×4): qty 1
  Filled 2018-01-21: qty 2
  Filled 2018-01-21 (×2): qty 1

## 2018-01-21 MED ORDER — ONDANSETRON HCL 4 MG/2ML IJ SOLN: 4.0000 mg | Freq: Four times a day (QID) | INTRAMUSCULAR | Status: DC | PRN

## 2018-01-21 MED ORDER — INSULIN ASPART 100 UNIT/ML ~~LOC~~ SOLN
0.0000 [IU] | Freq: Three times a day (TID) | SUBCUTANEOUS | Status: DC
  Administered 2018-01-21: 2 [IU] via SUBCUTANEOUS
  Administered 2018-01-23: 1 [IU] via SUBCUTANEOUS
  Administered 2018-01-25: 2 [IU] via SUBCUTANEOUS
  Administered 2018-01-25 – 2018-01-26 (×2): 1 [IU] via SUBCUTANEOUS
  Filled 2018-01-21: qty 1

## 2018-01-21 MED ORDER — ADULT MULTIVITAMIN W/MINERALS CH
1.0000 | ORAL_TABLET | Freq: Every day | ORAL | Status: DC
  Administered 2018-01-21 – 2018-01-26 (×6): 1 via ORAL
  Filled 2018-01-21 (×6): qty 1

## 2018-01-21 MED ORDER — FERROUS SULFATE 325 (65 FE) MG PO TABS
325.0000 mg | ORAL_TABLET | Freq: Every day | ORAL | Status: DC
  Administered 2018-01-21 – 2018-01-26 (×6): 325 mg via ORAL
  Filled 2018-01-21 (×7): qty 1

## 2018-01-21 MED ORDER — FUROSEMIDE 10 MG/ML IJ SOLN
40.0000 mg | Freq: Once | INTRAMUSCULAR | Status: AC
  Administered 2018-01-21: 40 mg via INTRAVENOUS
  Filled 2018-01-21: qty 4

## 2018-01-21 MED ORDER — PANTOPRAZOLE SODIUM 40 MG PO TBEC
40.0000 mg | DELAYED_RELEASE_TABLET | Freq: Every day | ORAL | Status: DC
  Administered 2018-01-21 – 2018-01-26 (×6): 40 mg via ORAL
  Filled 2018-01-21 (×6): qty 1

## 2018-01-21 MED ORDER — FUROSEMIDE 10 MG/ML IJ SOLN
60.0000 mg | Freq: Once | INTRAMUSCULAR | Status: AC
  Administered 2018-01-21: 60 mg via INTRAVENOUS
  Filled 2018-01-21: qty 6

## 2018-01-21 MED ORDER — AMLODIPINE BESYLATE 5 MG PO TABS
5.0000 mg | ORAL_TABLET | Freq: Every day | ORAL | Status: DC
  Administered 2018-01-22 – 2018-01-26 (×5): 5 mg via ORAL
  Filled 2018-01-21 (×6): qty 1

## 2018-01-21 MED ORDER — LIVING BETTER WITH HEART FAILURE BOOK
Freq: Once | Status: AC
  Administered 2018-01-21: 19:00:00

## 2018-01-21 MED ORDER — FUROSEMIDE 10 MG/ML IJ SOLN
60.0000 mg | Freq: Two times a day (BID) | INTRAMUSCULAR | Status: DC
  Administered 2018-01-21 – 2018-01-24 (×6): 60 mg via INTRAVENOUS
  Filled 2018-01-21 (×6): qty 6

## 2018-01-21 NOTE — Consult Note (Signed)
Cardiology Consultation:   Patient ID: Roberto [name]; [id number]; 05/17/1961   Admit date: 01/20/2018 Date of Consult: 01/21/2018  Primary Care Provider: Sherene Sires, DO Primary Cardiologist: Candee Furbish, MD new (previously followed in [place])  Patient Profile:   Roberto [name] is a 57 y.o. male with a hx of atrial fibrillation, chronic diastolic heart failure, Hx of DVT, morbid obesity, cor pulmonale, COPD, home oxygen 5L, HTN, GERD, venous insufficiency, DM and severe OSA on CPAP who is being seen today for the evaluation of bradycardia and CHF at the request of Dr. Nori Riis.  History of Present Illness:   [name] was previously followed for cardiology in [place], [place]. He moved to [facility] in about July of 2018. He is on disability for CHF and COPD and lives alone. He denies any history of MI or heart catheterizations. He has never been a smoker and does not drink alcohol.  Mr. Trainer has a history of atrial fibrillation with one previous attempted cardioversion. As far as he knows, he has been maintaining afib and has had low heart rates for years. He has been anticoagulated first for afib, then developed DVT. He failed Xarelto with increased DVT and is now on warfarin. He states that he never has lightheadedness or syncope with his low heart rates. He states that he uses his CPAP regularly.  He has been referred to cardiac/pulmonary rehab to start next month. In the mean time he has started exercising at [employer]. He is not able to do any cardio exercise due to his knees but he is doing seated leg exercises to try to strengthen his legs. He denies any exertional chest pain/pressure.   [name] has had increasing lower extremity edema for 3-4 days prior to admission. Yesterday morning he noted a mild changes in his breathing "not bad". He feels that he is carrying more fluid than usual and so presented to the hospital. He has not been weighing at home as he left his scale during one  of his moves. He has been taking his meds including torsemide and notes that he has not been putting out as much urine as he usually does. He denies orthopnea. He says that his lower extremity edema is improving with diuresis and his breathing has improved with diuresis and nebulizer.   Past Medical History:  Diagnosis Date  . Asthma   . CHF (congestive heart failure) ([place])   . COPD (chronic obstructive pulmonary disease) ([place])   . Diabetes mellitus without complication ([place])   . Hypertension     Past Surgical History:  Procedure Laterality Date  . SHOULDER SURGERY     0  Home Medications:  Prior to Admission medications   Medication Sig Start Date End Date Taking? Authorizing Provider  acetaminophen (TYLENOL) 500 MG tablet Take 1 tablet (500 mg total) by mouth every 6 (six) hours as needed for moderate pain. 05/09/17  Yes Bland, Scott, DO  amLODipine (NORVASC) 5 MG tablet Take 5 mg by mouth daily. 03/19/17  Yes [provider]  Blood Glucose Monitoring Suppl (ACCU-CHEK AVIVA PLUS) w/Device KIT TEST TWO TIMES DAILY 11/02/17  Yes Sherene Sires, DO  cholecalciferol (VITAMIN D) 1000 units tablet Take 1,000 Units by mouth daily.   Yes [provider]  ferrous sulfate 325 (65 FE) MG tablet Take 325 mg by mouth daily.   Yes [provider]  ipratropium-albuterol (DUONEB) 0.5-2.5 (3) MG/3ML SOLN Take 3 mLs by nebulization every 4 (four) hours as needed. Patient taking differently: Take  3 mLs by nebulization every 4 (four) hours as needed (SOB).  05/09/17  Yes Bland, Scott, DO  lisinopril (PRINIVIL,ZESTRIL) 10 MG tablet Take 1 tablet (10 mg total) by mouth daily. 04/18/17 04/18/18 Yes Guadalupe Dawn, MD  metFORMIN (GLUCOPHAGE) 1000 MG tablet Take 1,000 mg by mouth daily.  02/26/17  Yes [provider]  Multiple Vitamin (MULTIVITAMIN WITH MINERALS) TABS tablet Take 1 tablet by mouth daily.   Yes [provider]  mupirocin ointment (BACTROBAN) 2 % Place 1  application into the nose 2 (two) times daily. 06/20/17  Yes Bland, Scott, DO  naproxen sodium (ALEVE) 220 MG tablet Take 220 mg by mouth every 12 (twelve) hours as needed (pain).   Yes [provider]  OXYGEN Inhale 5 L into the lungs continuous.   Yes [provider]  pantoprazole (PROTONIX) 40 MG tablet Take 1 tablet (40 mg total) by mouth daily. Patient taking differently: Take 40 mg by mouth daily as needed (acid reflux).  05/09/17 01/21/18 Yes Sherene Sires, DO  PRESCRIPTION MEDICATION Inhale into the lungs as needed (whenever sleeping - naps or at night). CPAP   Yes [provider]  torsemide (DEMADEX) 20 MG tablet Take 2 tablets (40 mg total) by mouth at bedtime. 06/20/17 01/21/18 Yes Sherene Sires, DO  warfarin (COUMADIN) 1 MG tablet Take 2 tablets (2 mg total) daily by mouth. Give 2  tablet by mouth daily along with the 70m to equal 116mtotal 08/06/17 01/21/18 Yes BlSherene SiresDO  warfarin (COUMADIN) 10 MG tablet Take 1 tablet (10 mg total) by mouth daily. Give along with Warfarin 80m71mo equal 180m5mily 08/31/17 01/21/18 Yes BlanSherene Sires    Inpatient Medications: Scheduled Meds: . amLODipine  5 mg Oral Daily  . ferrous sulfate  325 mg Oral Q breakfast  . insulin aspart  0-9 Units Subcutaneous TID WC  . multivitamin with minerals  1 tablet Oral Daily  . pantoprazole  40 mg Oral Daily  . sodium chloride flush  3 mL Intravenous Q12H  . warfarin  12 mg Oral q1800  . Warfarin - Physician Dosing Inpatient   Does not apply q1800   Continuous Infusions: . sodium chloride     PRN Meds: sodium chloride, acetaminophen, ipratropium-albuterol, ondansetron (ZOFRAN) IV, sodium chloride flush  Allergies:   No Known Allergies  Social History:   Social History   Socioeconomic History  . Marital status: Single    Spouse name: Not on file  . Number of children: Not on file  . Years of education: Not on file  . Highest education level: Not on file  Occupational  History  . Not on file  Social Needs  . Financial resource strain: Not on file  . Food insecurity:    Worry: Not on file    Inability: Not on file  . Transportation needs:    Medical: Not on file    Non-medical: Not on file  Tobacco Use  . Smoking status: Never Smoker  . Smokeless tobacco: Never Used  Substance and Sexual Activity  . Alcohol use: No  . Drug use: No  . Sexual activity: Not on file  Lifestyle  . Physical activity:    Days per week: Not on file    Minutes per session: Not on file  . Stress: Not on file  Relationships  . Social connections:    Talks on phone: Not on file    Gets together: Not on file    Attends religious service:  Not on file    Active member of club or organization: Not on file    Attends meetings of clubs or organizations: Not on file    Relationship status: Not on file  . Intimate partner violence:    Fear of current or ex partner: Not on file    Emotionally abused: Not on file    Physically abused: Not on file    Forced sexual activity: Not on file  Other Topics Concern  . Not on file  Social History Narrative  . Not on file    Family History:    Family History  Problem Relation Age of Onset  . Diabetes Mother   . Cancer Mother   . Diabetes Father   . High blood pressure Father   . Diabetes Brother      ROS:  Please see the history of present illness.   All other ROS reviewed and negative.     Physical Exam/Data:   Vitals:   01/21/18 0700 01/21/18 0915 01/21/18 0930 01/21/18 1400  BP: 122/63 112/82 126/76 116/67  Pulse: (!) 44 (!) 45 (!) 47 (!) 51  Resp: _0 (!) 21  Temp:      TempSrc:      SpO2: 94% 91% 94% 92%  Weight:      Height:        Intake/Output Summary (Last 24 hours) at 01/21/2018 1421 Last data filed at 01/21/2018 0927 Gross per 24 hour  Intake -  Output 700 ml  Net -700 ml   Filed Weights   01/21/18 0204  Weight: (!) 367 lb (166.5 kg)   Body mass index is 52.66 kg/m.  General:  Well  nourished, well developed, obese male in no acute distress HEENT: normal Lymph: no adenopathy Neck: no JVD Endocrine:  No thryomegaly Vascular: No carotid bruits; FA pulses 2+ bilaterally without bruits  Cardiac:  normal S1, S2; slow, irregular no murmur  Lungs:  clear to auscultation bilaterally, no wheezing, rhonchi or rales  Abd: soft, nontender, no hepatomegaly  Ext: 1+ lower extremity edema Musculoskeletal:  No deformities, BUE and BLE strength normal and equal Skin: warm and dry  Neuro:  CNs 2-12 intact, no focal abnormalities noted Psych:  Normal affect   EKG:  The EKG was personally reviewed and demonstrates:  Atrial fibrillation, 74 bpm, RAD, Incomplete right bundle branch block, Right ventricular hypertrophy  Telemetry:  Telemetry was personally reviewed and demonstrates:  Atrial fibrillation in the 40's, up to 60's with activity.   Relevant CV Studies:  Echo pending  Echocardiogram 04/15/2017 Study Conclusions - Left ventricle: The cavity size was normal. There was severe   concentric hypertrophy. Systolic function was normal. The   estimated ejection fraction was in the range of 60% to 65%. Wall   motion was normal; there were no regional wall motion   abnormalities. - Aortic valve: There was trivial regurgitation. - Aorta: Ascending aorta diameter: 41 mm (ED). - Ascending aorta: The ascending aorta was mildly dilated. - Mitral valve: There was mild regurgitation. - Right ventricle: The cavity size was severely dilated. Wall   thickness was normal. Systolic function was severely reduced. - Atrial septum: There was increased thickness of the septum,   consistent with lipomatous hypertrophy. - Pulmonic valve: There was mild regurgitation. - Pulmonary arteries: PA peak pressure: 86 mm Hg (S).  Impressions: - The right ventricular systolic pressure was increased consistent   with severe pulmonary hypertension.  Laboratory Data:  Chemistry Recent Labs  Lab  01/21/18 0205  NA 140  K 4.1  CL 101  CO2 32  GLUCOSE 104*  BUN 22*  CREATININE 1.63*  CALCIUM 8.9  GFRNONAA 45*  GFRAA 52*  ANIONGAP 7    Recent Labs  Lab 01/21/18 0544  PROT 6.7  ALBUMIN 3.3*  AST 23  ALT 17  ALKPHOS 88  BILITOT 1.0   Hematology Recent Labs  Lab 01/21/18 0205  WBC 8.1  RBC 5.18  HGB 14.8  HCT 48.1  MCV 92.9  MCH 28.6  MCHC 30.8  RDW 15.7*  PLT 267   Cardiac Enzymes Recent Labs  Lab 01/21/18 0544  TROPONINI 0.03*    Recent Labs  Lab 01/20/18 2116  TROPIPOC 0.03    BNP Recent Labs  Lab 01/21/18 0205  BNP 207.5*    DDimer No results for input(s): DDIMER in the last 168 hours.  Radiology/Studies:  Dg Chest 2 View  Result Date: 01/20/2018 CLINICAL DATA:  CHF patient oxygen dependent. Increased leg swelling and abdominal distention. Increased shortness of breath. EXAM: CHEST - 2 VIEW COMPARISON:  05/08/2017 FINDINGS: Shallow inspiration. Prominent cardiac enlargement with mild pulmonary vascular congestion. Mild hazy basilar infiltrates likely represent early edema. No pleural effusions. No pneumothorax. No focal consolidation. Mediastinal contours appear intact. IMPRESSION: Cardiac enlargement with pulmonary vascular congestion and mild basilar edema. Electronically Signed   By: Lucienne Capers M.D.   On: 01/20/2018 22:12    Assessment and Plan:   Acute on chronic diastolic CHF -Increased dyspnea, edema and O2 requirement -last echo in 03/2017: EF 60-65%, severe concentric hypertrophy, trivial AR, mild MR, PA peak pressure 86 mmHg, severely dilated RV with severely reduced RV EF. Severe pulmonary hypertension. Increased thickness of the septum consistent with lipomatous hypertrophy -Updated echo pending.  -BNP 207.5, may be underestimated in sever obesity -CXR showed pulmonary vascular congestion and mild basilar edema.  -Wt up 6 lbs from office visit in February. -Diuresing with IV lasix, has had 2 doses: 40 mg and 60 mg. Not much  urine output charted.  -Would continue to diurese as likely has 10-20 pounds of extra fluid.  -Will place on lasix 60 mg IV BID -Strict I&O, daily wts.  -Will need to arrange for follow up in our office after discharge.   Bradycardia -Pt had multiple episodes of transient bradycardia in the ED. He has been noted to have afib with bradycardia in the 20's-30's in 04/2017 and pt reports several years of bradycardia without significant symptoms.  -Not on AV nodal blocking agents.  -Tele shows rates in the 40's at rest, up to the 60's with activity. Has adequate chronotropic response. No current indication for pacemaker.   Permanent Atrial fibrillation -Has hx of afib previously followed in [place] with attempted cardioversion several years ago.  -Prior EKGs show afib with EKG in 03/2017 showing afib at 49 bpm.  -K+ and Mag normal. TSH in normal range.  -Normal size left atrium on echo in 03/2017 -CHA2DS2/VAS Stroke Risk Score is 5 (CHF, HTN, DM, stroke (2)). He is already on chronic anticoagulation with warfarin for DVT/PE. INR is therapeutic at 2.85 -With his obesity, lung disease and long duration of atrial fib he has very little to no chance of converting to sinus rhythm. -He is rate controlled without medications.  -No change in therapy at this time.    Chronic respiratory failure with hypoxia -On home oxygen therapy -Has been referred to cardiac/pulmonary rehab -Encouraged compliance with CPAP  AKI -Baseline SCr 1.1-1.2. On  admission SCr 1.6. Possibly related to CHF.  -ACE-I on hold.  -Continue to monitor with diuresis  Hypertension -Home meds include amlodipine 5 mg daily, lisinopril 10 mg daily, torsemide 40 mg daily at hs -Currently ACE-I on hold due to AKI and need for diuresis.  -BP well controlled.   Hx DVT/PE -Failed Xarelto. Now anticoagulated with warfarin -Not felt to be likely to have acute DVT, even he did, therapy would not change as he is already anticoagualted.    Severe OSA with hypoxia -By sleep study in 2014 -States compliance with CPAP  Diabtetes type 2 -On Metformin outpatient. Currently on SSI while in hospital.  -Hgb A1c was 6.4 in 10/2017, adequate control.    For questions or updates, please contact [place] Please consult [email or web address] for contact info under Cardiology/STEMI.   Signed, Daune Perch, NP  01/21/2018 2:21 PM   Personally seen and examined. Agree with above.  57 year old with permanent atrial fibrillation, has been in for several years with failed cardioversion, morbid obesity, secondary pulmonary hypertension, on chronic anticoagulation because of DVT, wears CPAP mask for severe obstructive sleep apnea admitted with increased fluid overload.  He states that he has had bradycardia for quite some time, several years.  This is not a change for him.  He is feeling better after diuresis during this hospitalization.  GEN: Well nourished, well developed, in no acute distress Obese HEENT: normal  Neck: no JVD, carotid bruits, or masses Cardiac: Bradycardia irregular; no murmurs, rubs, or gallops, chronic appearing lower extremity edema  Respiratory:  clear to auscultation bilaterally, normal work of breathing GI: soft, nontender, nondistended, + BS, obese MS: no deformity or atrophy  Skin: warm and dry, no rash Neuro:  Alert and Oriented x 3, Strength and sensation are intact Psych: euthymic mood, full affect  Lab work, EKG, telemetry personally reviewed  Acute on chronic diastolic heart failure associated with morbid obesity/sleep apnea - Continue with IV diuresis, have changed to 60 mill grams IV twice daily.  May need increased dosing.  Goal for 2 L daily.  Likely has several liters of fluid.  Bradycardia -Long-standing with persistent atrial fibrillation.  He does have evidence of chronotropic response increasing his heart rate with activity.  No indication for pacemaker.  Permanent atrial  fibrillation -Failed cardioversion in the past.  His success rate at future cardioversions would be almost 0. No need for rate control agent. - Coumadin.  Morbid obesity/sleep apnea -CPAP mask.  Compliant.  Candee Furbish, MD

## 2018-01-21 NOTE — ED Notes (Addendum)
Patient placed on zoll per MD request.

## 2018-01-21 NOTE — ED Notes (Signed)
Multiple episodes of pt  afib with brady down to low 30's, lasting several seconds and trending back up into the 60's. Pt not symptomatic, denies CP, dizziness, or increased SOB. Pt states "this has been going on for awhile but they never seem to do anything about it." EDP already aware. Roberto [name]

## 2018-01-21 NOTE — ED Notes (Addendum)
Pt heart rate drops into the 30s and 40s  Ranges from 38 to 43  Nurse notified in triage

## 2018-01-21 NOTE — ED Provider Notes (Signed)
[facility] EMERGENCY DEPARTMENT Provider Note   CSN: [id number] Arrival date & time: 01/20/18  2050   History   Chief Complaint Chief Complaint  Patient presents with  . Shortness of Breath    HPI Roberto [name] is a 57 y.o. male.  The history is provided by the patient.  Shortness of Breath  This is a recurrent problem. The problem occurs frequently.The current episode started more than 2 days ago. The problem has been gradually worsening. Associated symptoms include leg swelling. Pertinent negatives include no fever, no hemoptysis, no chest pain, no syncope and no vomiting. Associated medical issues include heart failure.  patient with History of  morbid obesity, CHF, dvt presents with increasing lower extremity edema and shortness of breath.  He reports he is med compliant.  He reports he is on home oxygen.  He denies any chest pain.  He does report orthopnea. He does report he is still ambulatory   Past Medical History:  Diagnosis Date  . Asthma   . CHF (congestive heart failure) ([place])   . COPD (chronic obstructive pulmonary disease) ([place])   . Diabetes mellitus without complication ([place])   . Hypertension     Patient Active Problem List   Diagnosis Date Noted  . Solitary pulmonary nodule 06/20/2017  . Abscess 06/20/2017  . Anticoagulation management encounter 06/05/2017  . GERD without esophagitis 05/10/2017  . Acute deep vein thrombosis (DVT) of femoral vein of left lower extremity ([place])   . Essential hypertension   . Morbid obesity ([place])   . Dyspnea   . Saphenous vein clot, left 05/04/2017  . Cor pulmonale, chronic ([place]) 05/04/2017  . Type 2 diabetes mellitus with diabetic polyneuropathy, without long-term current use of insulin ([place])   . Chronic respiratory failure with hypoxia ([place])   . Acute deep vein thrombosis (DVT) of proximal vein of left lower extremity ([place])   . Combined congestive systolic and diastolic heart failure ([place])   . Left leg  pain   . Atrial fibrillation ([place])   . Community acquired pneumonia     Past Surgical History:  Procedure Laterality Date  . SHOULDER SURGERY          Home Medications    Prior to Admission medications   Medication Sig Start Date End Date Taking? Authorizing Provider  acetaminophen (TYLENOL) 500 MG tablet Take 1 tablet (500 mg total) by mouth every 6 (six) hours as needed for moderate pain. 05/09/17   Sherene Sires, DO  amLODipine (NORVASC) 5 MG tablet Take 5 mg by mouth daily. 03/19/17   [provider]  Blood Glucose Monitoring Suppl (ACCU-CHEK AVIVA PLUS) w/Device KIT TEST TWO TIMES DAILY 11/02/17   Sherene Sires, DO  ferrous sulfate 325 (65 FE) MG tablet Take 325 mg by mouth daily.    [provider]  ipratropium-albuterol (DUONEB) 0.5-2.5 (3) MG/3ML SOLN Take 3 mLs by nebulization every 4 (four) hours as needed. 05/09/17   Sherene Sires, DO  lisinopril (PRINIVIL,ZESTRIL) 10 MG tablet Take 1 tablet (10 mg total) by mouth daily. 04/18/17 04/18/18  Guadalupe Dawn, MD  metFORMIN (GLUCOPHAGE) 1000 MG tablet Take 1,000 mg by mouth 2 (two) times daily. 02/26/17   [provider]  Multiple Vitamin (MULTIVITAMIN WITH MINERALS) TABS tablet Take 1 tablet by mouth daily.    [provider]  mupirocin ointment (BACTROBAN) 2 % Place 1 application into the nose 2 (two) times daily. 06/20/17   Sherene Sires, DO  naproxen sodium (ALEVE) 220 MG tablet  Take 220 mg by mouth every 12 (twelve) hours as needed (pain).    [provider]  OXYGEN Inhale 5 L into the lungs continuous.    [provider]  pantoprazole (PROTONIX) 40 MG tablet Take 1 tablet (40 mg total) by mouth daily. 05/09/17 08/07/17  Sherene Sires, DO  PRESCRIPTION MEDICATION Inhale into the lungs as needed (whenever sleeping - naps or at night). CPAP    [provider]  torsemide (DEMADEX) 20 MG tablet Take 2 tablets (40 mg total) by mouth at bedtime. 06/20/17 09/18/17  Sherene Sires, DO    warfarin (COUMADIN) 1 MG tablet Take 2 tablets (2 mg total) daily by mouth. Give 2  tablet by mouth daily along with the 65m to equal 146mtotal 08/06/17 09/05/17  BlSherene SiresDO  warfarin (COUMADIN) 10 MG tablet Take 1 tablet (10 mg total) by mouth daily. Give along with Warfarin 56m64mo equal 156m24mily 08/31/17 09/30/17  BlanSherene Sires    Family History Family History  Problem Relation Age of Onset  . Diabetes Mother   . Cancer Mother   . Diabetes Father   . High blood pressure Father   . Diabetes Brother     Social History Social History   Tobacco Use  . Smoking status: Never Smoker  . Smokeless tobacco: Never Used  Substance Use Topics  . Alcohol use: No  . Drug use: No     Allergies   Patient has no known allergies.   Review of Systems Review of Systems  Constitutional: Negative for fever.  Respiratory: Positive for shortness of breath. Negative for hemoptysis.   Cardiovascular: Positive for leg swelling. Negative for chest pain and syncope.  Gastrointestinal: Negative for vomiting.  Neurological: Negative for syncope.  All other systems reviewed and are negative.    Physical Exam Updated Vital Signs BP 101/69   Pulse (!) 57   Temp 98.5 F (36.9 C) (Oral)   Resp 17   Ht 1.778 m (_0 )   Wt (!) 166.5 kg (367 lb)   SpO2 92%   BMI 52.66 kg/m   Physical Exam CONSTITUTIONAL: chronically ill appearing HEAD: Normocephalic/atraumatic EYES: EOMI ENMT: Mucous membranes moist NECK: supple no meningeal signs SPINE/BACK:entire spine nontender CV: irregular, no loud murmurs but limited due to body size LUNGS: Lungs are clear to auscultation bilaterally, no apparent distress ABDOMEN: soft, nontender, obese GU:no cva tenderness NEURO: Pt is awake/alert/appropriate, moves all extremitiesx4.  No facial droop.   EXTREMITIES: pulses normal/equal, full ROM, chronic symmetric edema to bilateral lower extremities SKIN: warm, color normal PSYCH: no abnormalities  of mood noted, alert and oriented to situation   ED Treatments / Results  Labs (all labs ordered are listed, but only abnormal results are displayed) Labs Reviewed  PROTIME-INR - Abnormal; Notable for the following components:      Result Value   Prothrombin Time 29.7 (*)    All other components within normal limits  BASIC METABOLIC PANEL - Abnormal; Notable for the following components:   Glucose, Bld 104 (*)    BUN 22 (*)    Creatinine, Ser 1.63 (*)    GFR calc non Af Amer 45 (*)    GFR calc Af Amer 52 (*)    All other components within normal limits  CBC WITH DIFFERENTIAL/PLATELET - Abnormal; Notable for the following components:   RDW 15.7 (*)    All other components within normal limits  BRAIN NATRIURETIC PEPTIDE - Abnormal; Notable for the following components:  B Natriuretic Peptide 207.5 (*)    All other components within normal limits  I-STAT TROPONIN, ED    EKG EKG Interpretation  Date/Time:  Sunday January 20 2018 20:56:32 EDT Ventricular Rate:  74 PR Interval:    QRS Duration: 94 QT Interval:  366 QTC Calculation: 406 R Axis:   119 Text Interpretation:  Atrial fibrillation Right axis deviation Incomplete right bundle branch block Right ventricular hypertrophy Septal infarct , age undetermined ST & T wave abnormality, consider inferior ischemia ST & T wave abnormality, consider anterolateral ischemia Abnormal ECG Confirmed by Ripley Fraise [phone]) on 01/21/2018 1:35:15 AM   Radiology Dg Chest 2 View  Result Date: 01/20/2018 CLINICAL DATA:  CHF patient oxygen dependent. Increased leg swelling and abdominal distention. Increased shortness of breath. EXAM: CHEST - 2 VIEW COMPARISON:  05/08/2017 FINDINGS: Shallow inspiration. Prominent cardiac enlargement with mild pulmonary vascular congestion. Mild hazy basilar infiltrates likely represent early edema. No pleural effusions. No pneumothorax. No focal consolidation. Mediastinal contours appear intact. IMPRESSION:  Cardiac enlargement with pulmonary vascular congestion and mild basilar edema. Electronically Signed   By: Lucienne Capers M.D.   On: 01/20/2018 22:12    Procedures Procedures  CRITICAL CARE Performed by: Sharyon Cable Total critical care time: 33 minutes Critical care time was exclusive of separately billable procedures and treating other patients. Critical care was necessary to treat or prevent imminent or life-threatening deterioration. Critical care was time spent personally by me on the following activities: development of treatment plan with patient and/or surrogate as well as nursing, discussions with consultants, evaluation of patient's response to treatment, examination of patient, obtaining history from patient or surrogate, ordering and performing treatments and interventions, ordering and review of laboratory studies, ordering and review of radiographic studies, pulse oximetry and re-evaluation of patient's condition. Patient presenting with worsening shortness of breath and is to be bradycardic into the 30s.  Patient will require admission for monitoring evaluation of bradycardia.  Medications Ordered in ED Medications  albuterol (PROVENTIL) (2.5 MG/3ML) 0.083% nebulizer solution 5 mg (5 mg Nebulization Given 01/20/18 2126)  furosemide (LASIX) injection 40 mg (40 mg Intravenous Given 01/21/18 0249)     Initial Impression / Assessment and Plan / ED Course  I have reviewed the triage vital signs and the nursing notes.  Pertinent labs & imaging results that were available during my care of the patient were reviewed by me and considered in my medical decision making (see chart for details).     He Is noted to be intermittently bradycardic with A. fib.  He is not on any kind of rate control medications He denies syncope.  he overall appears well We will give Lasix and reassess 3:23 AM Patient found to have worsening heart failure, was also noted to have A. fib with slow  ventricular response.  His heart rate is now into the 30s at times and then will improve to the 50s.  I will place the pacer pads on him just in case he decompensates.  He will require admission.  I discussed that with the family medicine resident for admission. At this point it is unclear the cause of a slow ventricular response.  He is not on rate control medications, his potassium is normal Final Clinical Impressions(s) / ED Diagnoses   Final diagnoses:  Bradycardia  Atrial fibrillation with slow ventricular response (HCC)  Acute on chronic respiratory failure with hypoxia [facility])    ED Discharge Orders    None  Ripley Fraise, MD 01/21/18 [phone]

## 2018-01-21 NOTE — Progress Notes (Signed)
This note also relates to the following rows which could not be included: Pulse Rate - Cannot attach notes to unvalidated device data Resp - Cannot attach notes to unvalidated device data SpO2 - Cannot attach notes to unvalidated device data  Increased pressures and O2 to maintain sats above 90%.  RT will monitor.

## 2018-01-21 NOTE — Progress Notes (Signed)
Patient refuses CPAP for the night. RT will continue to monitor.

## 2018-01-21 NOTE — Care Management Note (Signed)
Case Management Note  Patient Details  Name: Roberto [name] MRN: [id number] Date of Birth: [birth year]  Subjective/Objective:                  57 y.o. male presenting with 2 days of SOB and leg swelling. PMH INCLUDES AFIB, CHF with systolic and diastolic failure, history of DVT, morbid obesity, cor pulmonale, COPD, home oxygen 5 L nasal cannula, HTN, GERD, venous insufficiency, DM.  From home with daughter.  Action/Plan: Admit status INPATIENT (CHF); anticipate discharge [facility].   Expected Discharge Date:  (unknown)               Expected Discharge Plan:  [place]  In-House Referral:  Clinical Social Work   Status of Service:  In process, will continue to follow  If discussed at Long Length of Stay Meetings, dates discussed:    Additional Comments:  Fuller Mandril, RN 01/21/2018, 11:26 AM

## 2018-01-21 NOTE — H&P (Addendum)
[facility] Admission History and Physical Service Pager: [phone]  Patient name: Roberto Roberto [name] Medical record number: [id number] Date of birth: [birth year] Age: 57 y.o. Gender: Roberto [name]  Primary Care Provider: Sherene Sires, DO Consultants: None Code Status: Full  Chief Complaint: Shortness of Breath  Assessment and Plan: Roberto Roberto [name] is a 57 y.o. Roberto [name] presenting with 2 days of SOB and leg swelling. PMH is significant for atrial fibrillation, HFpEF with systolic and diastolic failure, history of DVT, morbid obesity, cor pulmonale, COPD, home oxygen 5 L nasal cannula, hypertension, GERD, venous insufficiency, diabetes mellitus  Dyspnea likely due to mild CHF exacerbation Patient presenting with 2 days of dyspnea and lower extremity edema consistent with fluid overload and CHF exacerbation. Patient requiring 5.5 L [place] to feel more comfortable, this is close to patient's home oxygen of 5 L nasal cannula.  BNP is slightly elevated at 208 (although BNP can be falsely low in obesity), previous BNP 113.  Interestingly, patient's weight is down by 6 pounds from office visit in February. Last ECHO 03/2017 with right sided heart failure 2/2 severe pulmonary HTN.  EKG showed atrial fibrillation with incomplete R BBB.  Negative i-STAT troponin x1 and patient denies chest pain, so ACS less likely. Dyspnea is also possible due to COPD as he did express improvement with nebulizers, however without fever, copious sputum, and active exacerbation less likely.  Without fever, sick contacts, elevated white count, or findings on chest x-ray, pneumonia seems unlikely cause of dyspnea.  Patient is also endorsing bilateral lower extremity, L > R.  Patient has a history of left lower extremity DVT, however patient was therapeutic on warfarin with INR of 2.8, so PE unlikely.  Wells PE score 0.  Patient received IV Lasix 40 mg in ED.  Will admit for continued diuresis and monitoring of work of  breathing. -Admit to telemetry, attending Dr. Nori Riis -Continuous pulse ox, supplementary O2 as necessary -Trend troponins -A.m. EKG -Monitor work of breathing -Vital signs per floor -Consider additional diuresis with Lasix IV in the a.m. -Echocardiogram -Bilateral lower extremity Doppler to rule out DVT -TSH -Elevate legs -TED hose  Asymptomatic bradycardia In the ED, patient had multiple episodes of transient, asymptomatic bradycardia.  He also had atrial fibrillation.  EKG also showed incomplete right bundle branch block.  No new findings on EKG though.  Patient's heart rate would be in the 60s, wander down into the 30s and then gradually return to the 60s.  During this time the patient is asymptomatic from dizziness chest pain, no worsening shortness of breath.  He is unaware that his heart rate is changing at this time. Patient had no electrolyte abnormalities consistent with causing bradycardia.  Patient is not on any rate control agents such as digoxin, amiodarone, beta-blockers that might induce bradycardia.  Prior hospitalization in 04/2017 for CHF exacerbation, patient had similar episodes of bradycardia that resolved after appropriate diuresis.    Patient's bradycardia likely due to chronic sinus node dysfunction in setting of atrial fibrillation, CHF.  -Telemetry -Magnesium, phosphorus -Repeat EKG -TSH -Cardiology consult for possible Holter monitor and further work-up of bradycardia -If becomes profoundly bradycardic or symptomatic, consider atropine with low threshold to consult CCM versus cardiology -External pacing pads in place  AKI, mild Creatinine on admission was 1.6.  Baseline appears to be 1.1-1.2.  Possibly elevated in the setting of decreased perfusion perfusion from CHF. -Monitor creatinine, especially in setting of diuresis -Hold home lisinopril, other nephrotoxic agents  Atrial fibrillation Patient presented  atrial fibrillation with transient bradycardia.   Patient chronically anticoagulated on warfarin 14m a day.  INR therapeutic at 2.8 on admission. -Telemetry -Continue home warfarin  HFpEF with Systolic and Diastolic failure  Cor pulmonale  Pulmonary hypertension As noted above patient presented volume overloaded, with elevated BNP  210.  Last echo cardiogram was in 03/2017.  Showed EF  60-65%, severe pulmonary hypertension with with right-sided systolic dysfunction.  Weight is down 6 pounds from last office visit in 10/2017. Taking Torsemide 453mdaily at home. -Daily weights -Strict I's and O's -Continue IV Lasix in a.m. -Holding lisinopril in setting of AKI  Hypertension Normotensive on admission.  At home takes lisinopril 10 mg, amlodipine 5 mg -Monitor blood pressure in setting of diuresis -Continue home amlodipine -Holding lisinopril in setting of AKI  COPD On 5 L nasal cannula at home.  Shortness of breath improved after albuterol. -Continuous pulse ox -Continue home duo nebs as needed -Supplemental oxygen as necessary  GERD Well-controlled on home PPI -Protonix daily  Diabetes mellitus. Last A1c 6.4 on 10/2017.  Takes metformin 1000 milligrams twice daily -Hold metformin inpatient -Sliding-scale insulin sensitive  History of multiple left lower extremity DVT Patient endorsing left lower extremity swelling worsening than right lower extremely swelling.  History of bilateral lower examine DVTs.  Patient had lower external Dopplers on 04/2017 that showed subacute DVT in femoral and popliteal veins lower extremity.  Patient is therapeutic on warfarin. -Bilateral lower extremity Dopplers to rule out DVT  OSA Patient uses CPAP at night. -CPAP nightly  FEN/GI: HHD/ carb modified Prophylaxis: Warfarin  Disposition: Inpatient for CHF exacerbation  History of Present Illness: ReReginaldo Roberto [name] a [age].o. Roberto [name] presenting with 2 days of SOB and leg swelling.  He came to the ED because of this.  He also endorses some dry  cough starting last night.  He is also had worsening lower extremity leg swelling left greater than right.  The leg swelling is been painless.  He denies any constitutional symptoms including fevers or chills.  He has not had any nausea vomiting diarrhea constipation.  He has had no sick contacts.  He denies hemoptysis.  He does not check his weights at home so he does not know if his weight is gone up.  Does endorsing drinking much more liquid than he usually does over the past month since visiting his sister.  Says that he is only supposed to drink about "4 bottles of water", but has been drinking more soda and juice than normal.  At home, he is on 5 L of oxygen, he has not had to use more than that.  In the ED, he was also found to be transiently bradycardic to the 30s.  He denies any form of dizziness or lightheadedness, palpitations, chest pain.  He states that he has had this type of a heart rate before but that it had resolved at a previous hospitalization.  ED course, patient was 5.5 L to maintain saturations in the mid 90s, only requiring slightly more oxygen than his home.  Labs were obtained and significant for mild AKI with creatinine 1.6, BNP 208, therapeutic INR 2.85, chest x-ray showing pulmonary vascular congestion and bilateral mild edema.  EKG showed atrial fibrillation with heart rate in 70s, right bundle branch block, diffuse anterior septal T wave abnormalities, all consistent with prior EKG.  Patient was placed on telemetry and had multiple episodes of transient, asymptomatic bradycardia with heart rate dipping down into the 30s and then  returning to baseline 60s.  Patient was given albuterol nebulizer and IV Lasix 40 mg.  Patient reports improved breathing status post nebulizer.  Review Of Systems: Per HPI with the following additions:   Review of Systems  Constitutional: Negative for chills and fever.  HENT: Negative for congestion.   Respiratory: Positive for cough and shortness of  breath. Negative for hemoptysis, sputum production and wheezing.   Cardiovascular: Positive for leg swelling. Negative for chest pain, palpitations and orthopnea.  Gastrointestinal: Negative for abdominal pain, blood in stool, constipation, diarrhea, heartburn, nausea and vomiting.  Neurological: Negative for dizziness, focal weakness and headaches.    Patient Active Problem List   Diagnosis Date Noted  . Solitary pulmonary nodule 06/20/2017  . Abscess 06/20/2017  . Anticoagulation management encounter 06/05/2017  . GERD without esophagitis 05/10/2017  . Acute deep vein thrombosis (DVT) of femoral vein of left lower extremity ([place])   . Essential hypertension   . Morbid obesity ([place])   . Dyspnea   . Saphenous vein clot, left 05/04/2017  . Cor pulmonale, chronic ([place]) 05/04/2017  . Type 2 diabetes mellitus with diabetic polyneuropathy, without long-term current use of insulin ([place])   . Chronic respiratory failure with hypoxia ([place])   . Acute deep vein thrombosis (DVT) of proximal vein of left lower extremity ([place])   . Combined congestive systolic and diastolic heart failure ([place])   . Left leg pain   . Atrial fibrillation ([place])   . Community acquired pneumonia     Past Medical History: Past Medical History:  Diagnosis Date  . Asthma   . CHF (congestive heart failure) ([place])   . COPD (chronic obstructive pulmonary disease) ([place])   . Diabetes mellitus without complication ([place])   . Hypertension     Past Surgical History: Past Surgical History:  Procedure Laterality Date  . SHOULDER SURGERY      Social History: Social History   Tobacco Use  . Smoking status: Never Smoker  . Smokeless tobacco: Never Used  Substance Use Topics  . Alcohol use: No  . Drug use: No   Additional social history: Is at home, walks with a cane, denies any smoking, alcohol use, drugs.  Patient is disabled. Please also refer to relevant sections of EMR.  Family History: Family History  Problem  Relation Age of Onset  . Diabetes Mother   . Cancer Mother   . Diabetes Father   . High blood pressure Father   . Diabetes Brother    Allergies and Medications: No Known Allergies No current facility-administered medications on file prior to encounter.    Current Outpatient Medications on File Prior to Encounter  Medication Sig Dispense Refill  . acetaminophen (TYLENOL) 500 MG tablet Take 1 tablet (500 mg total) by mouth every 6 (six) hours as needed for moderate pain. 30 tablet 0  . amLODipine (NORVASC) 5 MG tablet Take 5 mg by mouth daily.  1  . Blood Glucose Monitoring Suppl (ACCU-CHEK AVIVA PLUS) w/Device KIT TEST TWO TIMES DAILY 1 kit 0  . ferrous sulfate 325 (65 FE) MG tablet Take 325 mg by mouth daily.    Marland Kitchen ipratropium-albuterol (DUONEB) 0.5-2.5 (3) MG/3ML SOLN Take 3 mLs by nebulization every 4 (four) hours as needed. 360 mL 3  . lisinopril (PRINIVIL,ZESTRIL) 10 MG tablet Take 1 tablet (10 mg total) by mouth daily. 30 tablet 11  . metFORMIN (GLUCOPHAGE) 1000 MG tablet Take 1,000 mg by mouth 2 (two) times daily.  1  . Multiple Vitamin (MULTIVITAMIN  WITH MINERALS) TABS tablet Take 1 tablet by mouth daily.    . mupirocin ointment (BACTROBAN) 2 % Place 1 application into the nose 2 (two) times daily. 30 g 2  . naproxen sodium (ALEVE) 220 MG tablet Take 220 mg by mouth every 12 (twelve) hours as needed (pain).    . OXYGEN Inhale 5 L into the lungs continuous.    . pantoprazole (PROTONIX) 40 MG tablet Take 1 tablet (40 mg total) by mouth daily. 30 tablet 2  . PRESCRIPTION MEDICATION Inhale into the lungs as needed (whenever sleeping - naps or at night). CPAP    . torsemide (DEMADEX) 20 MG tablet Take 2 tablets (40 mg total) by mouth at bedtime. 180 tablet 0  . warfarin (COUMADIN) 1 MG tablet Take 2 tablets (2 mg total) daily by mouth. Give 2  tablet by mouth daily along with the 43m to equal 170mtotal 60 tablet 0  . warfarin (COUMADIN) 10 MG tablet Take 1 tablet (10 mg total) by mouth  daily. Give along with Warfarin 62m25mo equal 162m49mily 30 tablet 0    Objective: BP 101/69   Pulse (!) 57   Temp 98.5 F (36.9 C) (Oral)   Resp 17   Ht 5' 10" (1.778 m)   Wt (!) 367 lb (166.5 kg)   SpO2 92%   BMI 52.66 kg/m  Exam: Gen: NAD, resting comfortably, morbidly obese HEENT: No JVD, moist mucous membranes CV: Irregular rhythm, bradycardic with no murmurs appreciated Pulm: On 5.5 L nasal cannula, diffuse basilar crackles, distant breath sounds due to body habitus GI: Normal bowel sounds present. Soft, Nontender, Nondistended. MSK: 5/5 strength in upper and lower extremities, 1+ edema to knee in lower extremities, negative Homans sign Skin: Woody appearing lower extremities consistent with venous insufficiency, multiple small grade 1 ulcers without purulence or erythema or tenderness, multiple abrasion in lower abdominal skin folds, without any appearance of infection  Neuro: grossly normal, moves all extremities Psych: Normal affect and thought content  Labs and Imaging: CBC BMET  Recent Labs  Lab 01/21/18 0205  WBC 8.1  HGB 14.8  HCT 48.1  PLT 267   Recent Labs  Lab 01/21/18 0205  NA 140  K 4.1  CL 101  CO2 32  BUN 22*  CREATININE 1.63*  GLUCOSE 104*  CALCIUM 8.9     Dg Chest 2 View  Result Date: 01/20/2018 CLINICAL DATA:  CHF patient oxygen dependent. Increased leg swelling and abdominal distention. Increased shortness of breath. EXAM: CHEST - 2 VIEW COMPARISON:  05/08/2017 FINDINGS: Shallow inspiration. Prominent cardiac enlargement with mild pulmonary vascular congestion. Mild hazy basilar infiltrates likely represent early edema. No pleural effusions. No pneumothorax. No focal consolidation. Mediastinal contours appear intact. IMPRESSION: Cardiac enlargement with pulmonary vascular congestion and mild basilar edema. Electronically Signed   By: WillLucienne Capers.   On: 01/20/2018 22:12    ThomBonnita Hollow 01/21/2018, 3:14 AM PGY-1, [facility] pager: [phone] pages welcome  FPTS Upper-Level Resident Addendum  I have independently interviewed and examined the patient. I have discussed the above with the original author and agree with their documentation. My edits for correction/addition/clarification are in blue. Please see also any attending notes.   KatyHyman Bible PGY-3, [facility] pager: [phone] pages welcome through [facility]

## 2018-01-21 NOTE — Discharge Summary (Signed)
[facility] Discharge Summary  Patient name: Roberto [name] Medical record number: [id number] Date of birth: 1961/09/21 Age: 57 y.o. Gender: male Date of Admission: 01/20/2018  Date of Discharge: 01/26/18 Admitting Physician: Dickie La, MD  Primary Care Provider: Sherene Sires, DO Consultants: cardiology  Indication for Hospitalization: SOB  Discharge Diagnoses/Problem List:  HFpEF COPD requiring 5L home O2 Asymptomatic bradycardia (particularly when sleeping) Pulmonary HTN Morbid obesity Osteoarthritis Afib HTN Hx of DVT OSA DM2  Disposition: to home  Discharge Condition: stable  Discharge Exam:  General: frustrated,  morbidly obese male, in NAD on 5L [place] (home dose) Cardiovascular: not tachy irregular rate, 2/6 murmur Respiratory: chronic course lung sounds at baseline, no wheezes.  Abdomen: obese abdomen, soft, non tender to palpation MSK: knees with chronic pain consistent with arthritis, no erythema/lesions.   Extremities:1 + pitting edema above compression socks, 1+ underneath.    Brief Hospital Course:  Roberto [name] is a 57 y.o. male with PMH significant for atrial fibrillation, HFpEF with systolic and diastolic failure, history of DVT, morbid obesity, cor pulmonale, COPD, home oxygen 5 L nasal cannula, hypertension, GERD, venous insufficiency, diabetes mellitus presenting with 2 days of SOB and leg swelling consistent with CHF exacerbation. BNP elevated on admission with CXR vascular congestion and mild basilar edema. EKG showed atrial fibrillation with incomplete RBBB. Troponins neg x1 after 2 @ 0.03 determined to be likely demand ischemia. Patient was a febrile without leukocytosis so felt not to be infectious etiology. Patient received one dose of IV Lasix 19m in the ED with some urine output. Was subsequently started on IV Lasix 662mBID with improvement in volume status.  Patient also noted to be bradycardic to 20s-40s intermittently  overnight which is a known trend from prior admission. Cardiology consulted who recommended this was asymptomatic with prompt resolution on waking and no intervention was required. Updated ECHO obtained HFpEF 60-65% with pulmonary HTN and moderate LVH.  He was diuresed to 371 at which point he developed right sided MSK pain and refusing lasix.  He rehydrated to 372 by discharge with some resolution treated with baclofen/flexeril.  We are calling dry weight 373 at this time based on his symptoms and discharging with baclofen/flexeril and (6) 87m54morco for breakthrough pain.   Issues for Follow Up:  1. Patient encouraged to restart attempts at weight loss.  2. Patient with new right sided thoracic pain prior to discharge consistent with cramping vs muscle strain.  No trauma, labs were steaadily improving, no CVA tenderness, no belly pain.  Patient sent home with baclofen/flexeril/(6) 87mg33mrco, and a rolling walker to assist ambulation.  Dry weight was adjusted upward 2 lbs to 373 to accomodate for potential overdiuresis contributing.  3.  New dry weight 373.  Patient instructed that we are increasing home torsemide to 60mg10mm 40mg.78m is also instructed to weigh daily on his new scale and if 2lbs over dry weight for 2 days, he should take an extra torsemide.  If he has to take 2 pills for 2 days in a row he needs to call in to the clinic.  Significant Procedures:   Significant Labs and Imaging:  Recent Labs  Lab 01/24/18 0515 01/25/18 0718 01/26/18 0610  WBC 7.8 7.0 7.5  HGB 14.5 14.7 14.3  HCT 46.6 45.5 45.5  PLT 234 234 239   Recent Labs  Lab 01/21/18 0544 01/22/18 0705 01/23/18 0406 01/24/18 0515 01/25/18 0718 01/26/18 0610  NA  --  142  139 138 137 135  K  --  4.2 4.1 4.1 4.1 4.3  CL  --  99* 98* 98* 98* 93*  CO2  --  33* 33* 30 30 33*  GLUCOSE  --  95 112* 95 92 96  BUN  --  20 23* 25* 29* 25*  CREATININE  --  1.55* 1.55* 1.44* 1.51* 1.31*  CALCIUM  --  8.9 8.7* 8.5* 8.6*  8.9  MG 2.0  --   --   --   --   --   PHOS 4.1  --   --   --   --   --   ALKPHOS 88  --   --   --   --   --   AST 23  --   --   --   --   --   ALT 17  --   --   --   --   --   ALBUMIN 3.3*  --   --   --   --   --       Results/Tests Pending at Time of Discharge:   Discharge Medications:  Allergies as of 01/26/2018   No Known Allergies     Medication List    STOP taking these medications   enoxaparin 150 MG/ML injection Commonly known as:  LOVENOX     TAKE these medications   ACCU-CHEK AVIVA PLUS w/Device Kit TEST TWO TIMES DAILY   acetaminophen 500 MG tablet Commonly known as:  TYLENOL Take 1 tablet (500 mg total) by mouth every 6 (six) hours as needed for moderate pain.   ALEVE 220 MG tablet Generic drug:  naproxen sodium Take 220 mg by mouth every 12 (twelve) hours as needed (pain).   amLODipine 5 MG tablet Commonly known as:  NORVASC Take 5 mg by mouth daily.   Baclofen 5 MG Tabs Take 5 mg by mouth 3 (three) times daily.   cholecalciferol 1000 units tablet Commonly known as:  VITAMIN D Take 1,000 Units by mouth daily.   cyclobenzaprine 10 MG tablet Commonly known as:  FLEXERIL Take 1 tablet (10 mg total) by mouth 3 (three) times daily as needed for muscle spasms.   ferrous sulfate 325 (65 FE) MG tablet Take 325 mg by mouth daily.   HYDROcodone-acetaminophen 5-325 MG tablet Commonly known as:  NORCO Take 1 tablet by mouth every 6 (six) hours as needed for up to 6 doses for moderate pain.   ipratropium-albuterol 0.5-2.5 (3) MG/3ML Soln Commonly known as:  DUONEB Take 3 mLs by nebulization every 4 (four) hours as needed. What changed:  reasons to take this   lisinopril 10 MG tablet Commonly known as:  PRINIVIL,ZESTRIL Take 1 tablet (10 mg total) by mouth daily.   metFORMIN 1000 MG tablet Commonly known as:  GLUCOPHAGE Take 1,000 mg by mouth daily.   multivitamin with minerals Tabs tablet Take 1 tablet by mouth daily.   mupirocin ointment 2  % Commonly known as:  BACTROBAN Place 1 application into the nose 2 (two) times daily.   OXYGEN Inhale 5 L into the lungs continuous.   pantoprazole 40 MG tablet Commonly known as:  PROTONIX Take 1 tablet (40 mg total) by mouth daily. What changed:    when to take this  reasons to take this   [place] into the lungs as needed (whenever sleeping - naps or at night). CPAP   torsemide 20 MG tablet Commonly known as:  DEMADEX Take  3 tablets (60 mg total) by mouth at bedtime. What changed:  how much to take   warfarin 1 MG tablet Commonly known as:  COUMADIN Take as directed. If you are unsure how to take this medication, talk to your nurse or doctor. Original instructions:  Take 2 tablets (2 mg total) daily by mouth. Give 2  tablet by mouth daily along with the 60m to equal 187mtotal   warfarin 10 MG tablet Commonly known as:  COUMADIN Take as directed. If you are unsure how to take this medication, talk to your nurse or doctor. Original instructions:  Take 1 tablet (10 mg total) by mouth daily. Give along with Warfarin 32m67mo equal 132m63mily            Durable Medical Equipment  (From admission, onward)        Start     Ordered   01/26/18 0922  For home use only DME 4 wheeled rolling walker with seat  Once    Question:  Patient needs a walker to treat with the following condition  Answer:  Ambulatory dysfunction   01/26/18 0922   01/25/18 0616  Heart failure home health orders  (Heart failure home health orders / Face to face)  Once    Comments:  Heart Failure Follow-up Care:  Verify follow-up appointments per Patient Discharge Instructions. Confirm transportation arranged. Reconcile home medications with discharge medication list. Remove discontinued medications from use. Assist patient/caregiver to manage medications using pill box. Reinforce low sodium food selection Assessments: Vital signs and oxygen saturation at each visit. Assess  home environment for safety concerns, caregiver support and availability of low-sodium foods. Consult SociEducation officer, museum/OT, Dietitian, and CNA based on assessments. Perform comprehensive cardiopulmonary assessment. Notify MD for any change in condition or weight gain of 3 pounds in one day or 5 pounds in one week with symptoms. Daily Weights and Symptom Monitoring: Lab freq/fluid restrictions per heart failure clinic  Ensure patient has access to scales. Teach patient/caregiver to weigh daily before breakfast and after voiding using same scale and record.    Teach patient/caregiver to track weight and symptoms and when to notify Provider. Activity: Develop individualized activity plan with patient/caregiver.  Question Answer Comment  Heart Failure Follow-up Care [facility] ([facility] at [phone] the following labs Basic Metabolic Panel   Lab frequency Other see comments   Fax lab results to A[facility] Clinic at [phone] Low Sodium Heart Healthy   Fluid restrictions: See other comments      01/25/18 [id number]  Discharge Instructions: Please refer to Patient Instructions section of EMR for full details.  Patient was counseled important signs and symptoms that should prompt return to medical care, changes in medications, dietary instructions, activity restrictions, and follow up appointments.   Follow-Up Appointments: Follow-up Information    BlanSherene Sires Follow up.   Specialty:  Family Medicine Contact information: [id number] [id number] 01/26/2018, 11:54 AM PGY-1, [facility]

## 2018-01-21 NOTE — ED Notes (Signed)
Dr. Brett Albino at bedside

## 2018-01-21 NOTE — Progress Notes (Signed)
Patient evaluated in the ED for dyspnea and transient, asymptomatic bradycardia.  Patient seen in the ED and vital signs were stable.  Full H&P to follow

## 2018-01-21 NOTE — Progress Notes (Signed)
SOCIAL NOTE:  I am patient's pcp and went by to see him when I noted he was on the inpatient list.  He was in good spirits and optimistic about his recent attempts to exercise at [employer].  He thinks his leg swelling is fluid related and does not feel like the DVT pain did to him, subjectively.  Inpatient team is handling all care decisions.  -Dr. Criss Rosales

## 2018-01-21 NOTE — Progress Notes (Signed)
VASCULAR LAB PRELIMINARY  PRELIMINARY  PRELIMINARY  PRELIMINARY  Bilateral lower extremity venous duplex completed.    Preliminary report:  There is no obvious evidence of DVT or SVT noted in the visualized veins of the bilateral lower extremities.   Linzey Ramser, RVT 01/21/2018, 3:12 PM

## 2018-01-21 NOTE — ED Notes (Signed)
Blood work collected on 4/28 at 2130; however labs pending as collected but not in process or resulted. Main lab called and state they will find tubes and run them asap, and will call this RN back if needed.

## 2018-01-21 NOTE — ED Notes (Signed)
Lab unable to find blood work, Forensic scientist lab work back and this RN will recollect at this time. EDP aware and at bedside.

## 2018-01-22 ENCOUNTER — Inpatient Hospital Stay (HOSPITAL_COMMUNITY): Payer: Medicare HMO

## 2018-01-22 ENCOUNTER — Other Ambulatory Visit (HOSPITAL_COMMUNITY): Payer: Medicare HMO

## 2018-01-22 ENCOUNTER — Other Ambulatory Visit: Payer: Self-pay

## 2018-01-22 DIAGNOSIS — I509 Heart failure, unspecified: Secondary | ICD-10-CM

## 2018-01-22 DIAGNOSIS — I361 Nonrheumatic tricuspid (valve) insufficiency: Secondary | ICD-10-CM

## 2018-01-22 LAB — ECHOCARDIOGRAM COMPLETE
HEIGHTINCHES: 70 in
Weight: 6019.2 oz

## 2018-01-22 LAB — BASIC METABOLIC PANEL
ANION GAP: 10 (ref 5–15)
BUN: 20 mg/dL (ref 6–20)
CHLORIDE: 99 mmol/L — AB (ref 101–111)
CO2: 33 mmol/L — AB (ref 22–32)
Calcium: 8.9 mg/dL (ref 8.9–10.3)
Creatinine, Ser: 1.55 mg/dL — ABNORMAL HIGH (ref 0.61–1.24)
GFR calc non Af Amer: 48 mL/min — ABNORMAL LOW (ref >60)
GFR, EST AFRICAN AMERICAN: 56 mL/min — AB (ref >60)
GLUCOSE: 95 mg/dL (ref 65–99)
Potassium: 4.2 mmol/L (ref 3.5–5.1)
SODIUM: 142 mmol/L (ref 135–145)

## 2018-01-22 LAB — GLUCOSE, CAPILLARY
GLUCOSE-CAPILLARY: 101 mg/dL — AB (ref 65–99)
Glucose-Capillary: 97 mg/dL (ref 65–99)
Glucose-Capillary: 101 mg/dL — ABNORMAL HIGH (ref 65–99)
Glucose-Capillary: 149 mg/dL — ABNORMAL HIGH (ref 65–99)

## 2018-01-22 LAB — PROTIME-INR
INR: 2.57
Prothrombin Time: 27.4 seconds — ABNORMAL HIGH (ref 11.4–15.2)

## 2018-01-22 LAB — TROPONIN I: Troponin I: <0.03 ng/mL (ref <0.03)

## 2018-01-22 MED ORDER — TRAMADOL HCL 50 MG PO TABS
50.0000 mg | ORAL_TABLET | Freq: Once | ORAL | Status: AC
  Administered 2018-01-22: 50 mg via ORAL
  Filled 2018-01-22: qty 1

## 2018-01-22 NOTE — Progress Notes (Signed)
  Echocardiogram 2D Echocardiogram has been performed.  Filmore Molyneux G Bevely Hackbart 01/22/2018, 3:27 PM

## 2018-01-22 NOTE — Progress Notes (Addendum)
Family Medicine Teaching Service Daily Progress Note Intern Pager: [phone]  Patient name: Roberto [name] Medical record number: [id number] Date of birth: Apr 14, 1961 Age: 57 y.o. Gender: male  Primary Care Provider: Sherene Sires, DO Consultants: None Code Status: Full  Pt Overview and Major Events to Date:  4/29 - admitted for dyspnea  Assessment and Plan: Roberto [name] is a 57 y.o. male presenting with 2 days of SOB and leg swelling. PMH is significant for atrial fibrillation, HFpEF with systolic and diastolic failure, history of DVT, morbid obesity, cor pulmonale, COPD, home oxygen 5 L nasal cannula, hypertension, GERD, venous insufficiency, diabetes mellitus.  Dyspnea likely due to mild CHF exacerbation Maintained sats on 5L overnight, stable on home O2 this am. Weights likely unreliable (367 on admission, 382 later that evening, 376 this am), however with 6lbs decrease overnight. ~4.9L UOP overnight. Repeat EKG this morning with more notable T wave inversion in inferior leads although troponins stable 0.03>0.03> <0.03. LE doppler final read pending, although preliminary read is without DVT, no signs of DVT on exam this am. TSH low at 0.330, will obtain T3, T4. - Cardiology consulted, recommended IV Lasix 66m BID  -Continuous pulse ox, supplementary O2 as necessary -Trend troponins -Monitor work of breathing -Vital signs per floor -Echocardiogram - f/u LE doppler final read -Elevate legs -TED hose  Asymptomatic bradycardia HR 40-50s overnight. Cardiology consulted who stated patient had adequate chronotropic effect with movement and recommended no need for pacing at this time. TSH wnl. EKG with improved HR however with more significant T wave inversions likely due to demand as noted above. Mag and Phos levels wnl. - Cardiology consulted, appreciate recs -Telemetry -External pacing pads in place  AKI, mild Cr 1.6>1.55.  Baseline appears to be 1.1-1.2.  Possibly elevated in  the setting of decreased perfusion perfusion from CHF. Improving with diuresis. -Monitor creatinine, especially in setting of diuresis -Hold home lisinopril, other nephrotoxic agents  Atrial fibrillation Rate controlled, not on meds.  Patient chronically anticoagulated on warfarin 254ma day.  INR therapeutic at 2.8 on admission. -Telemetry -Continue home warfarin  HFpEF with Systolic and Diastolic failure  Cor pulmonale  Pulmonary hypertension As noted above, with good diuresis overnight.  Last echo cardiogram was in 03/2017.  Showed EF  60-65%, severe pulmonary hypertension with with right-sided systolic dysfunction.  Weight is down 6 pounds from last office visit in 10/2017. Taking Torsemide 4052maily at home. -Daily weights -Strict I's and O's -IV Lasix per Cards recs -Holding lisinopril in setting of AKI  Hypertension Normotensive overnight.  At home takes lisinopril 10 mg, amlodipine 5 mg -Monitor blood pressure in setting of diuresis -Continue home amlodipine -Holding lisinopril in setting of AKI  COPD On 5 L nasal cannula at home.  Shortness of breath improved after albuterol. Currently on home O2, sating well. -Continuous pulse ox -Continue home duo nebs as needed -Supplemental oxygen as necessary  GERD Well-controlled on home PPI -Protonix daily  Diabetes mellitus. CBG 97 this am, 2u aspart received last 24 hours. Last A1c 6.4 on 10/2017.  Takes metformin 1000 milligrams twice daily -Hold metformin inpatient -Sliding-scale insulin sensitive  History of multiple left lower extremity DVT Patient endorsing left lower extremity swelling worsening than right lower extremely swelling.  History of bilateral lower examine DVTs.  Patient had lower external Dopplers on 04/2017 that showed subacute DVT in femoral and popliteal veins lower extremity.  Patient is therapeutic on warfarin. Preliminary read of LE Dopplers are negative for DVT. - LE  doppler final read  pending  OSA Patient uses CPAP at night. -CPAP nightly  FEN/GI: HHD/ carb modified Prophylaxis: Warfarin  Disposition: continue inpatient management of volume overload  Subjective:  Patient feeling better this morning, diuresed quite a bit overnight.   Objective: Temp:  [97.8 F (36.6 C)-97.9 F (36.6 C)] 97.8 F (36.6 C) (04/30 0628) Pulse Rate:  [40-60] 50 (04/30 0628) Resp:  [14-24] 18 (04/30 0628) BP: (108-132)/(57-78) 122/78 (04/30 0833) SpO2:  [90 %-97 %] 97 % (04/30 0628) Weight:  [376 lb 3.2 oz (170.6 kg)-382 lb 4.8 oz (173.4 kg)] 376 lb 3.2 oz (170.6 kg) (04/30 7017) Physical Exam: General: pleasant morbidly obese male, in NAD Cardiovascular: slightly bradycardic, irregular rhythm. No murmur Respiratory: CTAB, no wheezes/rales Abdomen: obese abdomen, soft, non tender to palpation Extremities: 2+ pitting edema above compression socks, 1+ underneath.  Laboratory: Recent Labs  Lab 01/21/18 0205  WBC 8.1  HGB 14.8  HCT 48.1  PLT 267   Recent Labs  Lab 01/21/18 0205 01/21/18 0544 01/22/18 0705  NA 140  --  142  K 4.1  --  4.2  CL 101  --  99*  CO2 32  --  33*  BUN 22*  --  20  CREATININE 1.63*  --  1.55*  CALCIUM 8.9  --  8.9  PROT  --  6.7  --   BILITOT  --  1.0  --   ALKPHOS  --  88  --   ALT  --  17  --   AST  --  23  --   GLUCOSE 104*  --  95    Imaging/Diagnostic Tests: No results found.  Rory Percy, DO 01/22/2018, 9:25 AM PGY-1, [place] Intern pager: [phone], text pages welcome

## 2018-01-22 NOTE — Consult Note (Signed)
[facility] Inpatient Consult   01/22/2018  Roberto [name] 1961/08/07 [id number]  Patient screened for potential [facility] [employer] services. Patient is in the [place] of the [place] Management services under patient's [facility] plan.  Chart review reveals Roberto [name] a 57 y.o.male with PMH significant foratrial fibrillation,HFpEFwith systolic and diastolic failure,history of DVT, morbid obesity, cor pulmonale, COPD, home oxygen 5 L nasal cannula, hypertension, GERD, venous insufficiency,diabetes mellituspresenting with 2 days of SOBand leg swelling consistent with CHF exacerbation.  Met with the patient at the bedside. Patient states his primary care provider is Sherene Sires, DO at [facility].  This office is listed to provide the transition of care follow up.  Patient states he does not have a scale to weigh on at home.  Patient encouraged to get a bariatric scale.  He accepted a brochure, 24 hour magnet with contact information.  Will have staff follow up for scale.  Procedural member entered for 2D ECHO. Encouraged patient to contact for needs as well.  For questions, please contact:  Natividad Brood, RN BSN [facility] Liaison  [phone] business mobile phone Toll free office [phone]

## 2018-01-22 NOTE — Progress Notes (Addendum)
Progress Note  Patient Name: Roberto [name] Date of Encounter: 01/22/2018  Primary Cardiologist: Candee Furbish, MD   Subjective   Overall breathing a little bit better, no chest pain.  Discussed the 2 weeks ago he just joined [employer] to try to lose weight.  In his mind his goal is 280 pounds.  We discussed.  Inpatient Medications    Scheduled Meds: . amLODipine  5 mg Oral Daily  . ferrous sulfate  325 mg Oral Q breakfast  . furosemide  60 mg Intravenous Q12H  . insulin aspart  0-9 Units Subcutaneous TID WC  . multivitamin with minerals  1 tablet Oral Daily  . pantoprazole  40 mg Oral Daily  . sodium chloride flush  3 mL Intravenous Q12H  . warfarin  12 mg Oral q1800  . Warfarin - Physician Dosing Inpatient   Does not apply q1800   Continuous Infusions: . sodium chloride     PRN Meds: sodium chloride, acetaminophen, ipratropium-albuterol, ondansetron (ZOFRAN) IV, sodium chloride flush   Vital Signs    Vitals:   01/21/18 1658 01/21/18 2026 01/22/18 0628 01/22/18 0833  BP: (!) 113/57 132/69 108/62 122/78  Pulse: (!) 40 60 (!) 50   Resp: (!) _0 Temp: 97.8 F (36.6 C) 97.9 F (36.6 C) 97.8 F (36.6 C)   TempSrc: Oral Oral Oral   SpO2: 90% 94% 97%   Weight: (!) 382 lb 4.8 oz (173.4 kg)  (!) 376 lb 3.2 oz (170.6 kg)   Height: _1  (1.778 m)       Intake/Output Summary (Last 24 hours) at 01/22/2018 0849 Last data filed at 01/22/2018 0153 Gross per 24 hour  Intake 843 ml  Output 4925 ml  Net -4082 ml   Filed Weights   01/21/18 0204 01/21/18 1658 01/22/18 0628  Weight: (!) 367 lb (166.5 kg) (!) 382 lb 4.8 oz (173.4 kg) (!) 376 lb 3.2 oz (170.6 kg)    Telemetry    AFIB, no pauses greater than 3 seconds, brief NSVT 8 beats 5pm yesterday- Personally Reviewed  ECG    A. fib, bradycardic- Personally Reviewed  Physical Exam   GEN: No acute distress.  Overweight Neck: No JVD Cardiac:  Irregularly irregular, mildly bradycardic, no murmurs, rubs, or  gallops.  Respiratory: Clear to auscultation bilaterally. GI: Soft, nontender, non-distended  MS:  Chronic 3+ edema; No deformity. Neuro:  Nonfocal  Psych: Normal affect   Labs    Chemistry Recent Labs  Lab 01/21/18 0205 01/21/18 0544 01/22/18 0705  NA 140  --  142  K 4.1  --  4.2  CL 101  --  99*  CO2 32  --  33*  GLUCOSE 104*  --  95  BUN 22*  --  20  CREATININE 1.63*  --  1.55*  CALCIUM 8.9  --  8.9  PROT  --  6.7  --   ALBUMIN  --  3.3*  --   AST  --  23  --   ALT  --  17  --   ALKPHOS  --  88  --   BILITOT  --  1.0  --   GFRNONAA 45*  --  48*  GFRAA 52*  --  56*  ANIONGAP 7  --  10     Hematology Recent Labs  Lab 01/21/18 0205  WBC 8.1  RBC 5.18  HGB 14.8  HCT 48.1  MCV 92.9  MCH 28.6  MCHC 30.8  RDW  15.7*  PLT 267    Cardiac Enzymes Recent Labs  Lab 01/21/18 0544 01/21/18 1718 01/22/18 0000  TROPONINI 0.03* 0.03* <0.03    Recent Labs  Lab 01/20/18 2116  TROPIPOC 0.03     BNP Recent Labs  Lab 01/21/18 0205  BNP 207.5*     DDimer No results for input(s): DDIMER in the last 168 hours.   Radiology    Dg Chest 2 View  Result Date: 01/20/2018 CLINICAL DATA:  CHF patient oxygen dependent. Increased leg swelling and abdominal distention. Increased shortness of breath. EXAM: CHEST - 2 VIEW COMPARISON:  05/08/2017 FINDINGS: Shallow inspiration. Prominent cardiac enlargement with mild pulmonary vascular congestion. Mild hazy basilar infiltrates likely represent early edema. No pleural effusions. No pneumothorax. No focal consolidation. Mediastinal contours appear intact. IMPRESSION: Cardiac enlargement with pulmonary vascular congestion and mild basilar edema. Electronically Signed   By: Lucienne Capers M.D.   On: 01/20/2018 22:12    Cardiac Studies   ECHO 2018 - Left ventricle: The cavity size was normal. There was severe   concentric hypertrophy. Systolic function was normal. The   estimated ejection fraction was in the range of 60% to  65%. Wall   motion was normal; there were no regional wall motion   abnormalities. - Aortic valve: There was trivial regurgitation. - Aorta: Ascending aorta diameter: 41 mm (ED). - Ascending aorta: The ascending aorta was mildly dilated. - Mitral valve: There was mild regurgitation. - Right ventricle: The cavity size was severely dilated. Wall   thickness was normal. Systolic function was severely reduced. - Atrial septum: There was increased thickness of the septum,   consistent with lipomatous hypertrophy. - Pulmonic valve: There was mild regurgitation. - Pulmonary arteries: PA peak pressure: 86 mm Hg (S).  Impressions:  - The right ventricular systolic pressure was increased consistent   with severe pulmonary hypertension.  Patient Profile     57 y.o. male with permanent atrial fibrillation, morbid obesity, BMI of 54, obstructive sleep apnea compliant with CPAP, secondary pulmonary hypertension from obesity, chronic intake regulation because of repeat DVTs here with acute on chronic diastolic heart failure  Assessment & Plan    Acute on chronic diastolic heart failure/morbid obesity - This goes hand-in-hand with his BMI of 54.  Ultimately, significant weight loss of approximately 150 pounds will be the most significant benefit for him.  With his continued weight, he will continue to have changes of secondary pulmonary hypertension which will ultimately lead to right heart failure. - Excellent diuresis yesterday of approximately 4 L, weight from 382 down to 376.  Creatinine stable at 1.5.  Potassium 4.2.  Troponin normal.  Permanent atrial fibrillation - Unsuccessful cardioversion in the past.  He has been in atrial fibrillation for quite some time.  Continue with anticoagulation, warfarin.  Well rate controlled without AV nodal blocking agents.  Bradycardia -This is also been a long-standing finding for him.  At times with his obstructive sleep apnea, I would suspect significant  bradycardia to occur due to hyper vagal response during the night.  Ultimately, does not require a pacemaker.  He is able to increase his heart rate adequately for movement.    For questions or updates, please contact [place] Please consult [email or web address] for contact info under Cardiology/STEMI.      Signed, Candee Furbish, MD  01/22/2018, 8:49 AM

## 2018-01-23 DIAGNOSIS — J9621 Acute and chronic respiratory failure with hypoxia: Secondary | ICD-10-CM

## 2018-01-23 LAB — BASIC METABOLIC PANEL
Anion gap: 8 (ref 5–15)
BUN: 23 mg/dL — AB (ref 6–20)
CHLORIDE: 98 mmol/L — AB (ref 101–111)
CO2: 33 mmol/L — ABNORMAL HIGH (ref 22–32)
CREATININE: 1.55 mg/dL — AB (ref 0.61–1.24)
Calcium: 8.7 mg/dL — ABNORMAL LOW (ref 8.9–10.3)
GFR calc Af Amer: 56 mL/min — ABNORMAL LOW (ref >60)
GFR calc non Af Amer: 48 mL/min — ABNORMAL LOW (ref >60)
Glucose, Bld: 112 mg/dL — ABNORMAL HIGH (ref 65–99)
Potassium: 4.1 mmol/L (ref 3.5–5.1)
SODIUM: 139 mmol/L (ref 135–145)

## 2018-01-23 LAB — GLUCOSE, CAPILLARY
GLUCOSE-CAPILLARY: 133 mg/dL — AB (ref 65–99)
GLUCOSE-CAPILLARY: 173 mg/dL — AB (ref 65–99)
Glucose-Capillary: 108 mg/dL — ABNORMAL HIGH (ref 65–99)
Glucose-Capillary: 114 mg/dL — ABNORMAL HIGH (ref 65–99)

## 2018-01-23 LAB — PROTIME-INR
INR: 2.57
PROTHROMBIN TIME: 27.4 s — AB (ref 11.4–15.2)

## 2018-01-23 MED ORDER — TRAMADOL HCL 50 MG PO TABS
50.0000 mg | ORAL_TABLET | Freq: Once | ORAL | Status: AC
  Administered 2018-01-23: 50 mg via ORAL
  Filled 2018-01-23: qty 1

## 2018-01-23 MED ORDER — LIDOCAINE 5 % EX PTCH
2.0000 | MEDICATED_PATCH | CUTANEOUS | Status: DC
  Administered 2018-01-23 – 2018-01-26 (×4): 2 via TRANSDERMAL
  Filled 2018-01-23 (×4): qty 2

## 2018-01-23 NOTE — Clinical Social Work Note (Signed)
CSW acknowledges consult about DME assistance and patient needing a bariatric scale. RNCM notified.  CSW signing off. Consult again if any social work needs arise.  Dayton Scrape, [place]

## 2018-01-23 NOTE — Progress Notes (Signed)
Family Medicine Teaching Service Daily Progress Note Intern Pager: [phone]  Patient name: Roberto [name] Medical record number: [id number] Date of birth: 1961-03-03 Age: 57 y.o. Gender: male  Primary Care Provider: Sherene Sires, DO Consultants: None Code Status: Full  Pt Overview and Major Events to Date:  4/29 - admitted for dyspnea  Assessment and Plan: Roberto [name] is a 57 y.o. male presenting with 2 days of SOB and leg swelling. PMH is significant for atrial fibrillation, HFpEF with systolic and diastolic failure, history of DVT, morbid obesity, cor pulmonale, COPD, home oxygen 5 L nasal cannula, hypertension, GERD, venous insufficiency, diabetes mellitus.  Dyspnea likely due to mild CHF exacerbation Maintained sats on home 5L [place] overnight 4/30. Weights likely unreliable, however last recorded of 376 indicates diuresis. Down 2.8L on 4/30.   Repeat EKG this morning with more notable T wave inversion in inferior leads although troponins stable 0.03>0.03> <0.03. LE doppler unable to visualize DVT.  Echo showed Normal LV systolic function; moderate LVH; D shaped septum, mildly dilated ascending aorta; mild LAE and RAE; mild RVE with severely reduced RV function; moderate TR with severe pulmonary hypertension. - Cardiology consulted, recommended IV Lasix 6m BID  -Continuous pulse ox, supplementary O2 as necessary -Monitor work of breathing -Vital signs per floor -Elevate legs -TED hose -strict I/O  Asymptomatic bradycardia HR 40-50s overnight 4/30. Cardiology consulted who stated patient had adequate chronotropic effect with movement and recommended no need for pacing at this time. TSH wnl. EKG with improved HR however with more significant T wave inversions likely due to demand as noted above. Mag and Phos levels wnl. - Cardiology consulted, appreciate recs -Telemetry -External pacing pads in place  AKI, mild Cr 1.6>1.55 on 5/1.  Baseline appears to be 1.1-1.2.  Possibly  elevated in the setting of decreased perfusion perfusion from CHF. Improving with diuresis. -Monitor creatinine, especially in setting of diuresis -Hold home lisinopril, other nephrotoxic agents  Atrial fibrillation Rate controlled, not on meds.  Patient chronically anticoagulated on warfarin 247ma day.  INR therapeutic at 2.8 on admission. -Telemetry -Continue home warfarin  HFpEF with Systolic and Diastolic failure  Cor pulmonale  Pulmonary hypertension As noted above, with good so far in admission.  Showed EF  60-65%, severe pulmonary hypertension with with right-sided systolic dysfunction.  Weight is down 6 pounds from last office visit in 10/2017. Taking Torsemide 4043maily at home.  Echo showed Normal LV systolic function; moderate LVH; D shaped septum; mildly dilated ascending aorta; mild LAE and RAE; mild RVE with severely reduced RV function; moderate TR with severe pulmonary hypertension. -Daily weights -Strict I's and O's -IV Lasix per Cards recs -Holding lisinopril in setting of AKI  Hypertension Normotensive.  At home takes lisinopril 10 mg, amlodipine 5 mg -Monitor blood pressure in setting of diuresis -Continue home amlodipine -Holding lisinopril in setting of AKI  COPD On 5 L nasal cannula at home.  Shortness of breath improved after albuterol. Currently on home O2, sating well. -Continuous pulse ox -Continue home duo nebs as needed -Supplemental oxygen as necessary  GERD Well-controlled on home PPI -Protonix daily  Diabetes mellitus. CBG 97 this am, 2u aspart received last 24 hours. Last A1c 6.4 on 10/2017.  Takes metformin 1000 milligrams twice daily -Hold metformin inpatient -Sliding-scale insulin sensitive  History of multiple left lower extremity DVT- 5/1 INR 2.57 Patient endorsing left lower extremity swelling worsening than right lower extremely swelling.  History of bilateral lower examine DVTs.  Patient had lower external Dopplers  on 04/2017  that showed subacute DVT in femoral and popliteal veins lower extremity.  Patient is therapeutic on warfarin. LE Dopplers unable to visualize - warfarin per pharm  OSA  Patient uses CPAP at night. -CPAP nightly  FEN/GI: HHD/ carb modified Prophylaxis: Warfarin  Disposition: continue inpatient management of volume overload  Subjective:  Complaining mainly of chronic knee pain bilaterally at this point.  Feels respirations is almost back to baseline  Objective: Temp:  [97.8 F (36.6 C)-98.4 F (36.9 C)] 98.2 F (36.8 C) (05/01 0607) Pulse Rate:  [41-59] 48 (05/01 0607) Resp:  [16-18] 18 (05/01 0607) BP: (108-142)/(62-78) 113/71 (05/01 0607) SpO2:  [93 %-97 %] 93 % (05/01 0607) Weight:  [376 lb 3.2 oz (170.6 kg)] 376 lb 3.2 oz (170.6 kg) (04/30 1100) Physical Exam: General: pleasant morbidly obese male, in NAD Cardiovascular: slightly bradycardic, irregular rhythm. No murmur Respiratory: CTAB, no wheezes/rales Abdomen: obese abdomen, soft, non tender to palpation MSK: knees with pain, no erythema/lesions.   Extremities: 2+ pitting edema above compression socks, 1+ underneath.  Sensation intact, not painful  Laboratory: Recent Labs  Lab 01/21/18 0205  WBC 8.1  HGB 14.8  HCT 48.1  PLT 267   Recent Labs  Lab 01/21/18 0205 01/21/18 0544 01/22/18 0705 01/23/18 0406  NA 140  --  142 139  K 4.1  --  4.2 4.1  CL 101  --  99* 98*  CO2 32  --  33* 33*  BUN 22*  --  20 23*  CREATININE 1.63*  --  1.55* 1.55*  CALCIUM 8.9  --  8.9 8.7*  PROT  --  6.7  --   --   BILITOT  --  1.0  --   --   ALKPHOS  --  88  --   --   ALT  --  17  --   --   AST  --  23  --   --   GLUCOSE 104*  --  95 112*    Imaging/Diagnostic Tests: No results found.  Sherene Sires, DO 01/23/2018, 6:18 AM PGY-1, [place] Intern pager: [phone], text pages welcome

## 2018-01-23 NOTE — Progress Notes (Signed)
Progress Note  Patient Name: Roberto [name] Date of Encounter: 01/23/2018  Primary Cardiologist: Candee Furbish, MD   Subjective   Continues to make mild improvements with diuresis.  Decreasing shortness of breath.  No chest pain.  His knee was hurting this morning so he has not weighed yet.  Inpatient Medications    Scheduled Meds: . amLODipine  5 mg Oral Daily  . ferrous sulfate  325 mg Oral Q breakfast  . furosemide  60 mg Intravenous Q12H  . insulin aspart  0-9 Units Subcutaneous TID WC  . lidocaine  2 patch Transdermal Q24H  . multivitamin with minerals  1 tablet Oral Daily  . pantoprazole  40 mg Oral Daily  . sodium chloride flush  3 mL Intravenous Q12H  . warfarin  12 mg Oral q1800  . Warfarin - Physician Dosing Inpatient   Does not apply q1800   Continuous Infusions: . sodium chloride     PRN Meds: sodium chloride, acetaminophen, ipratropium-albuterol, ondansetron (ZOFRAN) IV, sodium chloride flush   Vital Signs    Vitals:   01/22/18 0833 01/22/18 1419 01/22/18 2018 01/23/18 0607  BP: 122/78 110/68 (!) 142/77 113/71  Pulse: (!) 59 (!) 51 (!) 41 (!) 48  Resp: _0 Temp:  98.1 F (36.7 C) 98.4 F (36.9 C) 98.2 F (36.8 C)  TempSrc:  Oral Oral Oral  SpO2:  95% 97% 93%  Weight:      Height:        Intake/Output Summary (Last 24 hours) at 01/23/2018 0940 Last data filed at 01/23/2018 0500 Gross per 24 hour  Intake 360 ml  Output 2200 ml  Net -1840 ml   Filed Weights   01/21/18 0204 01/21/18 1658 01/22/18 0628  Weight: (!) 367 lb (166.5 kg) (!) 382 lb 4.8 oz (173.4 kg) (!) 376 lb 3.2 oz (170.6 kg)    Telemetry    Atrial fibrillation no pauses greater than 3 seconds, brief NSVT 2 days ago, 8 beats- Personally Reviewed  ECG    A. fib, bradycardic- Personally Reviewed  Physical Exam   GEN: Well nourished, well developed, in no acute distress, obese  HEENT: normal  Neck: no JVD, carotid bruits, or masses Cardiac: irreg irreg; no murmurs, rubs,  or gallops, chronic edema  Respiratory:  clear to auscultation bilaterally, normal work of breathing GI: soft, nontender, nondistended, + BS MS: no deformity or atrophy  Skin: warm and dry, no rash Neuro:  Alert and Oriented x 3, Strength and sensation are intact Psych: euthymic mood, full affect   Labs    Chemistry Recent Labs  Lab 01/21/18 0205 01/21/18 0544 01/22/18 0705 01/23/18 0406  NA 140  --  142 139  K 4.1  --  4.2 4.1  CL 101  --  99* 98*  CO2 32  --  33* 33*  GLUCOSE 104*  --  95 112*  BUN 22*  --  20 23*  CREATININE 1.63*  --  1.55* 1.55*  CALCIUM 8.9  --  8.9 8.7*  PROT  --  6.7  --   --   ALBUMIN  --  3.3*  --   --   AST  --  23  --   --   ALT  --  17  --   --   ALKPHOS  --  88  --   --   BILITOT  --  1.0  --   --   GFRNONAA 45*  --  48*  [place] 7  --  10 8     Hematology Recent Labs  Lab 01/21/18 0205  WBC 8.1  RBC 5.18  HGB 14.8  HCT 48.1  MCV 92.9  MCH 28.6  MCHC 30.8  RDW 15.7*  PLT 267    Cardiac Enzymes Recent Labs  Lab 01/21/18 0544 01/21/18 1718 01/22/18 0000  TROPONINI 0.03* 0.03* <0.03    Recent Labs  Lab 01/20/18 2116  TROPIPOC 0.03     BNP Recent Labs  Lab 01/21/18 0205  BNP 207.5*     DDimer No results for input(s): DDIMER in the last 168 hours.   Radiology    No results found.  Cardiac Studies   ECHO 2018 - Left ventricle: The cavity size was normal. There was severe   concentric hypertrophy. Systolic function was normal. The   estimated ejection fraction was in the range of 60% to 65%. Wall   motion was normal; there were no regional wall motion   abnormalities. - Aortic valve: There was trivial regurgitation. - Aorta: Ascending aorta diameter: 41 mm (ED). - Ascending aorta: The ascending aorta was mildly dilated. - Mitral valve: There was mild regurgitation. - Right ventricle: The cavity size was severely dilated. Wall   thickness was normal. Systolic function was  severely reduced. - Atrial septum: There was increased thickness of the septum,   consistent with lipomatous hypertrophy. - Pulmonic valve: There was mild regurgitation. - Pulmonary arteries: PA peak pressure: 86 mm Hg (S).  Impressions:  - The right ventricular systolic pressure was increased consistent   with severe pulmonary hypertension.  Patient Profile     57 y.o. male with permanent atrial fibrillation, morbid obesity, BMI of 54, obstructive sleep apnea compliant with CPAP, secondary pulmonary hypertension from obesity, chronic intake regulation because of repeat DVTs here with acute on chronic diastolic heart failure  Assessment & Plan    Acute on chronic diastolic heart failure/morbid obesity - Continuing with aggressive diuresis, and out 2.8 L net yesterday, -4 L day prior.  Weight yesterday 376.  Permanent atrial fibrillation - Unsuccessful cardioversion in the past.  He has been in atrial fibrillation for quite some time.  Continue with anticoagulation, warfarin.  Well rate controlled without AV nodal blocking agents.  No changes made  Bradycardia -This is also been a long-standing finding for him.  At times with his obstructive sleep apnea, I would suspect significant bradycardia to occur due to hyper vagal response during the night.  Ultimately, does not require a pacemaker.  He is able to increase his heart rate adequately for movement.  No changes made    For questions or updates, please contact [place] Please consult [email or web address] for contact info under Cardiology/STEMI.      Signed, Candee Furbish, MD  01/23/2018, 9:40 AM

## 2018-01-24 LAB — CBC
HCT: 46.6 % (ref 39.0–52.0)
Hemoglobin: 14.5 g/dL (ref 13.0–17.0)
MCH: 28.7 pg (ref 26.0–34.0)
MCHC: 31.1 g/dL (ref 30.0–36.0)
MCV: 92.3 fL (ref 78.0–100.0)
PLATELETS: 234 10*3/uL (ref 150–400)
RBC: 5.05 MIL/uL (ref 4.22–5.81)
RDW: 15.6 % — AB (ref 11.5–15.5)
WBC: 7.8 10*3/uL (ref 4.0–10.5)

## 2018-01-24 LAB — PROTIME-INR
INR: 2.98
PROTHROMBIN TIME: 30.8 s — AB (ref 11.4–15.2)

## 2018-01-24 LAB — GLUCOSE, CAPILLARY
GLUCOSE-CAPILLARY: 117 mg/dL — AB (ref 65–99)
Glucose-Capillary: 109 mg/dL — ABNORMAL HIGH (ref 65–99)
Glucose-Capillary: 113 mg/dL — ABNORMAL HIGH (ref 65–99)
Glucose-Capillary: 102 mg/dL — ABNORMAL HIGH (ref 65–99)

## 2018-01-24 LAB — BASIC METABOLIC PANEL
ANION GAP: 10 (ref 5–15)
BUN: 25 mg/dL — AB (ref 6–20)
CALCIUM: 8.5 mg/dL — AB (ref 8.9–10.3)
CO2: 30 mmol/L (ref 22–32)
Chloride: 98 mmol/L — ABNORMAL LOW (ref 101–111)
Creatinine, Ser: 1.44 mg/dL — ABNORMAL HIGH (ref 0.61–1.24)
GFR calc Af Amer: >60 mL/min (ref >60)
GFR, EST NON AFRICAN AMERICAN: 53 mL/min — AB (ref >60)
GLUCOSE: 95 mg/dL (ref 65–99)
Potassium: 4.1 mmol/L (ref 3.5–5.1)
SODIUM: 138 mmol/L (ref 135–145)

## 2018-01-24 MED ORDER — TORSEMIDE 20 MG PO TABS: 40.0000 mg | ORAL_TABLET | Freq: Every day | ORAL | Status: DC

## 2018-01-24 MED ORDER — TORSEMIDE 20 MG PO TABS
60.0000 mg | ORAL_TABLET | Freq: Every day | ORAL | Status: DC
  Administered 2018-01-24: 60 mg via ORAL
  Filled 2018-01-24: qty 3

## 2018-01-24 MED ORDER — FUROSEMIDE 10 MG/ML IJ SOLN
60.0000 mg | Freq: Two times a day (BID) | INTRAMUSCULAR | Status: DC
  Filled 2018-01-24 (×3): qty 6

## 2018-01-24 NOTE — Progress Notes (Addendum)
Heart Failure Navigator Consult Note  Presentation: per Dr Marlou Porch Roberto [name] is a 57 y.o. male with a hx of atrial fibrillation, chronic diastolic heart failure, Hx of DVT, morbid obesity, cor pulmonale, COPD, home oxygen 5L, HTN, GERD, venous insufficiency, DM and severe OSA on CPAP.   Past Medical History:  Diagnosis Date  . Asthma   . CHF (congestive heart failure) ([facility])   . COPD (chronic obstructive pulmonary disease) ([facility])   . Diabetes mellitus without complication ([facility])   . Hypertension     Social History   Socioeconomic History  . Marital status: Single    Spouse name: Not on file  . Number of children: Not on file  . Years of education: Not on file  . Highest education level: Not on file  Occupational History  . Not on file  Social Needs  . Financial resource strain: Not on file  . Food insecurity:    Worry: Not on file    Inability: Not on file  . Transportation needs:    Medical: Not on file    Non-medical: Not on file  Tobacco Use  . Smoking status: Never Smoker  . Smokeless tobacco: Never Used  Substance and Sexual Activity  . Alcohol use: No  . Drug use: No  . Sexual activity: Not on file  Lifestyle  . Physical activity:    Days per week: Not on file    Minutes per session: Not on file  . Stress: Not on file  Relationships  . Social connections:    Talks on phone: Not on file    Gets together: Not on file    Attends religious service: Not on file    Active member of club or organization: Not on file    Attends meetings of clubs or organizations: Not on file    Relationship status: Not on file  Other Topics Concern  . Not on file  Social History Narrative  . Not on file    ECHO:Study Conclusions--01/22/18  - Left ventricle: The cavity size was normal. Wall thickness was   increased in a pattern of moderate LVH. Systolic function was   normal. The estimated ejection fraction was in the range of 60%   to 65%. Wall motion was normal; there  were no regional wall   motion abnormalities. - Ventricular septum: The contour showed diastolic flattening and   systolic flattening. - Ascending aorta: The ascending aorta was mildly dilated. - Mitral valve: Calcified annulus. - Left atrium: The atrium was mildly dilated. - Right ventricle: The cavity size was mildly dilated. Systolic   function was severely reduced. - Right atrium: The atrium was mildly dilated. - Atrial septum: The septum bowed from right to left, consistent   with increased right atrial pressure. - Tricuspid valve: There was moderate regurgitation. - Pulmonary arteries: Systolic pressure was severely increased. PA   peak pressure: 61 mm Hg (S). - Pericardium, extracardiac: A trivial pericardial effusion was   identified.  Impressions:  - Normal LV systolic function; moderate LVH; D shaped septum;   mildly dilated ascending aorta; mild LAE and RAE; mild RVE with   severely reduced RV function; moderate TR with severe pulmonary   hypertension.   BNP    Component Value Date/Time   BNP 207.5 (H) 01/21/2018 0205    ProBNP No results found for: PROBNP   Education Assessment and Provision:  Detailed education and instructions provided on heart failure disease management including the following:  Signs and symptoms  of Heart Failure When to call the physician Importance of daily weights Low sodium diet Fluid restriction Medication management Anticipated future follow-up appointments  Patient education given on each of the above topics.  Patient acknowledges understanding and acceptance of all instructions.  I spoke to patient regarding his current hospitalization.  He plans to return to home alone in [place] (has been living with daughter).  He tells me that he has been told in the past and received education regarding HF.  He does not have a scale and has not been weighing.  I gave him a scale for home use and reviewed the importance of daily weights  as well as when to contact the physician.  I reinforced a low sodium diet and fluid restriction.  He tells me that he feels dehydrated currently as his "kidneys" are hurting.  He tells me that he has not had a cardiologist in the past only PCP.  He will likely follow with [facility] going forward.  He denies nay issues with getting or taking prescribed medications.  Education Materials:  "Living Better With Heart Failure" Booklet, Daily Weight Tracker Tool   High Risk Criteria for Readmission and/or Poor Patient Outcomes:   EF <30%- No 60-65%  2 or more admissions in 6 months-No  Difficult social situation-No  Demonstrates medication noncompliance-denies    Barriers of Care:  Knowledge and compliance  Discharge Planning: Plans to return to [place] alone.  He would greatly benefit from [facility] for ongoing education, symptom recognition, and compliance reinforcement.

## 2018-01-24 NOTE — Progress Notes (Deleted)
Family Medicine Teaching Service Daily Progress Note Intern Pager: [phone]  Patient name: Roberto [name] Medical record number: [id number] Date of birth: Jun 11, 1961 Age: 57 y.o. Gender: male  Primary Care Provider: Sherene Sires, DO Consultants: None Code Status: Full  Pt Overview and Major Events to Date:  4/29 - admitted for dyspnea  Assessment and Plan: Roberto [name] is a 57 y.o. male presenting with 2 days of SOB and leg swelling. PMH is significant for atrial fibrillation, HFpEF with systolic and diastolic failure, history of DVT, morbid obesity, cor pulmonale, COPD, home oxygen 5 L nasal cannula, hypertension, GERD, venous insufficiency, diabetes mellitus.  Dyspnea likely due to mild CHF exacerbation- back to baseline Maintained sats on home 5L [place] overnight 5/1. Weights likely unreliable, however last recorded of 373 indicates diuresis. Down 1.7L on 5/1.   LE doppler unable to visualize DVT.  Echo showed Normal LV systolic function; moderate LVH; D shaped septum, mildly dilated ascending aorta; mild LAE and RAE; mild RVE with severely reduced RV function; moderate TR with severe pulmonary hypertension. - Cardiology consulted, recommended IV Lasix 74m BID  -Continuous pulse ox, supplementary O2 as necessary -Monitor work of breathing -Vital signs per floor -Elevate legs -TED hose -strict I/O -RNCM to assist with bariatric scale for closer home management of weights  Asymptomatic bradycardia- stable/chronic HR 50s-60s overnight 5/1. Cardiology consulted who stated patient had adequate chronotropic effect with movement and recommended no need for pacing at this time. TSH wnl. EKG with improved HR however with more significant T wave inversions likely due to demand as noted above. Mag and Phos levels wnl. - Cardiology consulted, appreciate recs -Telemetry -External pacing pads in place  AKI, improving Cr 1.6>1.55>1.44 on 5/2.  Baseline appears to be 1.1-1.2.  Possibly elevated  in the setting of decreased perfusion perfusion from CHF. Improving with diuresis. -Monitor creatinine, especially in setting of diuresis -Hold home lisinopril, other nephrotoxic agents  Atrial fibrillation Rate controlled, not on meds.  Patient chronically anticoagulated on warfarin 252ma day.  INR therapeutic at 2.8 on admission. -Telemetry -Continue home warfarin  HFpEF with Systolic and Diastolic failure  Cor pulmonale  Pulmonary hypertension As noted above, stable so far in admission.  Showed EF  60-65%, severe pulmonary hypertension with with right-sided systolic dysfunction.  Weight is down 6 pounds from last office visit in 10/2017. Taking Torsemide 4049maily at home.  Echo showed Normal LV systolic function; moderate LVH; D shaped septum; mildly dilated ascending aorta; mild LAE and RAE; mild RVE with severely reduced RV function; moderate TR with severe pulmonary hypertension. -Daily weights -Strict I's and O's -IV Lasix per Cards recs -Holding lisinopril in setting of AKI -obtaining bariatric scale for outpatient management  Hypertension- stable  Normotensive.  At home takes lisinopril 10 mg, amlodipine 5 mg -Monitor blood pressure in setting of diuresis -Continue home amlodipine -Holding lisinopril in setting of AKI  COPD- stable On 5 L nasal cannula at home.  Shortness of breath improved after albuterol. Currently on home O2, sating well. -Continuous pulse ox -Continue home duo nebs as needed -Supplemental oxygen as necessary  GERD- stable, no complaints currently Well-controlled on home PPI -Protonix daily  Diabetes mellitus. Stable, requiring >=2u /day of novalog CBG 97 this am, 2u aspart received last 24 hours. Last A1c 6.4 on 10/2017.  Takes metformin 1000 milligrams twice daily -Hold metformin inpatient -Sliding-scale insulin sensitive  History of multiple left lower extremity DVT- 5/1 INR 2.57 Patient endorsing left lower extremity swelling  worsening than  right lower extremely swelling.  History of bilateral lower examine DVTs.  Patient had lower external Dopplers on 04/2017 that showed subacute DVT in femoral and popliteal veins lower extremity.  Patient is therapeutic on warfarin. LE Dopplers unable to visualize - warfarin per pharm  OSA  Patient uses CPAP at night. -CPAP nightly  FEN/GI: HHD/ carb modified Prophylaxis: Warfarin  Disposition: continue inpatient management of volume overload  Subjective:  Patient at respiratory baseline, feels fluid in legs has returned to baseline as well.  Objective: Temp:  [97.8 F (36.6 C)-98.5 F (36.9 C)] 97.8 F (36.6 C) (05/02 0519) Pulse Rate:  [53-65] 63 (05/02 0519) Resp:  [18] 18 (05/02 0519) BP: (102-112)/(64-67) 112/67 (05/02 0519) SpO2:  [94 %-98 %] 94 % (05/02 0519) Weight:  [373 lb 14.4 oz (169.6 kg)-376 lb 11.2 oz (170.9 kg)] 373 lb 14.4 oz (169.6 kg) (05/02 0519) Physical Exam: General: pleasant morbidly obese male, in NAD on 5L [place] (home dose) Cardiovascular: regular rate, No murmur but habitus impedes auscultation Respiratory: course lung sounds at baseline, no wheezes Abdomen: obese abdomen, soft, non tender to palpation MSK: knees with pain, no erythema/lesions.   Extremities: 2+ pitting edema above compression socks, 1+ underneath.  Sensation intact, not painful  Laboratory: Recent Labs  Lab 01/21/18 0205  WBC 8.1  HGB 14.8  HCT 48.1  PLT 267   Recent Labs  Lab 01/21/18 0205 01/21/18 0544 01/22/18 0705 01/23/18 0406  NA 140  --  142 139  K 4.1  --  4.2 4.1  CL 101  --  99* 98*  CO2 32  --  33* 33*  BUN 22*  --  20 23*  CREATININE 1.63*  --  1.55* 1.55*  CALCIUM 8.9  --  8.9 8.7*  PROT  --  6.7  --   --   BILITOT  --  1.0  --   --   ALKPHOS  --  88  --   --   ALT  --  17  --   --   AST  --  23  --   --   GLUCOSE 104*  --  95 112*    Imaging/Diagnostic Tests: No results found.  Sherene Sires, DO 01/24/2018, 6:19 AM PGY-1, [place] Intern pager: [phone], text pages welcome

## 2018-01-24 NOTE — Progress Notes (Signed)
Family Medicine Teaching Service Daily Progress Note Intern Pager: [phone]  Patient name: Roberto [name] Medical record number: [id number] Date of birth: Jun 11, 1961 Age: 57 y.o. Gender: male  Primary Care Provider: Sherene Sires, DO Consultants: None Code Status: Full  Pt Overview and Major Events to Date:  4/29 - admitted for dyspnea  Assessment and Plan: Roberto [name] is a 57 y.o. male presenting with 2 days of SOB and leg swelling. PMH is significant for atrial fibrillation, HFpEF with systolic and diastolic failure, history of DVT, morbid obesity, cor pulmonale, COPD, home oxygen 5 L nasal cannula, hypertension, GERD, venous insufficiency, diabetes mellitus.  Dyspnea likely due to mild CHF exacerbation- back to baseline Maintained sats on home 5L [place] overnight 5/1. Weights likely unreliable, however last recorded of 373 indicates diuresis. Down 1.7L on 5/1.   LE doppler unable to visualize DVT.  Echo showed Normal LV systolic function; moderate LVH; D shaped septum, mildly dilated ascending aorta; mild LAE and RAE; mild RVE with severely reduced RV function; moderate TR with severe pulmonary hypertension. - Cardiology consulted, recommended IV Lasix 74m BID  -Continuous pulse ox, supplementary O2 as necessary -Monitor work of breathing -Vital signs per floor -Elevate legs -TED hose -strict I/O -RNCM to assist with bariatric scale for closer home management of weights  Asymptomatic bradycardia- stable/chronic HR 50s-60s overnight 5/1. Cardiology consulted who stated patient had adequate chronotropic effect with movement and recommended no need for pacing at this time. TSH wnl. EKG with improved HR however with more significant T wave inversions likely due to demand as noted above. Mag and Phos levels wnl. - Cardiology consulted, appreciate recs -Telemetry -External pacing pads in place  AKI, improving Cr 1.6>1.55>1.44 on 5/2.  Baseline appears to be 1.1-1.2.  Possibly elevated  in the setting of decreased perfusion perfusion from CHF. Improving with diuresis. -Monitor creatinine, especially in setting of diuresis -Hold home lisinopril, other nephrotoxic agents  Atrial fibrillation Rate controlled, not on meds.  Patient chronically anticoagulated on warfarin 252ma day.  INR therapeutic at 2.8 on admission. -Telemetry -Continue home warfarin  HFpEF with Systolic and Diastolic failure  Cor pulmonale  Pulmonary hypertension As noted above, stable so far in admission.  Showed EF  60-65%, severe pulmonary hypertension with with right-sided systolic dysfunction.  Weight is down 6 pounds from last office visit in 10/2017. Taking Torsemide 4049maily at home.  Echo showed Normal LV systolic function; moderate LVH; D shaped septum; mildly dilated ascending aorta; mild LAE and RAE; mild RVE with severely reduced RV function; moderate TR with severe pulmonary hypertension. -Daily weights -Strict I's and O's -IV Lasix per Cards recs -Holding lisinopril in setting of AKI -obtaining bariatric scale for outpatient management  Hypertension- stable  Normotensive.  At home takes lisinopril 10 mg, amlodipine 5 mg -Monitor blood pressure in setting of diuresis -Continue home amlodipine -Holding lisinopril in setting of AKI  COPD- stable On 5 L nasal cannula at home.  Shortness of breath improved after albuterol. Currently on home O2, sating well. -Continuous pulse ox -Continue home duo nebs as needed -Supplemental oxygen as necessary  GERD- stable, no complaints currently Well-controlled on home PPI -Protonix daily  Diabetes mellitus. Stable, requiring >=2u /day of novalog CBG 97 this am, 2u aspart received last 24 hours. Last A1c 6.4 on 10/2017.  Takes metformin 1000 milligrams twice daily -Hold metformin inpatient -Sliding-scale insulin sensitive  History of multiple left lower extremity DVT- 5/1 INR 2.57 Patient endorsing left lower extremity swelling  worsening than  right lower extremely swelling.  History of bilateral lower examine DVTs.  Patient had lower external Dopplers on 04/2017 that showed subacute DVT in femoral and popliteal veins lower extremity.  Patient is therapeutic on warfarin. LE Dopplers unable to visualize - warfarin per pharm  OSA  Patient uses CPAP at night. -CPAP nightly  FEN/GI: HHD/ carb modified Prophylaxis: Warfarin  Disposition:inpatient management of volume overload, likely d/c either 5/2-3  Subjective:  Patient at respiratory baseline, feels fluid in legs has returned to baseline as well.  Objective: Temp:  [97.8 F (36.6 C)-98.5 F (36.9 C)] 97.8 F (36.6 C) (05/02 0519) Pulse Rate:  [53-65] 63 (05/02 0519) Resp:  [18] 18 (05/02 0519) BP: (102-136)/(64-74) 136/74 (05/02 0839) SpO2:  [94 %-98 %] 94 % (05/02 0519) Weight:  [373 lb 14.4 oz (169.6 kg)-376 lb 11.2 oz (170.9 kg)] 373 lb 14.4 oz (169.6 kg) (05/02 0519) Physical Exam: General: pleasant morbidly obese male, in NAD on 5L [place] (home dose) Cardiovascular: regular rate, No murmur but habitus impedes auscultation Respiratory: course lung sounds at baseline, no wheezes Abdomen: obese abdomen, soft, non tender to palpation MSK: knees with pain, no erythema/lesions.   Extremities: 2+ pitting edema above compression socks, 1+ underneath.  Sensation intact, not painful  Laboratory: Recent Labs  Lab 01/21/18 0205 01/24/18 0515  WBC 8.1 7.8  HGB 14.8 14.5  HCT 48.1 46.6  PLT 267 234   Recent Labs  Lab 01/21/18 0544 01/22/18 0705 01/23/18 0406 01/24/18 0515  NA  --  142 139 138  K  --  4.2 4.1 4.1  CL  --  99* 98* 98*  CO2  --  33* 33* 30  BUN  --  20 23* 25*  CREATININE  --  1.55* 1.55* 1.44*  CALCIUM  --  8.9 8.7* 8.5*  PROT 6.7  --   --   --   BILITOT 1.0  --   --   --   ALKPHOS 88  --   --   --   ALT 17  --   --   --   AST 23  --   --   --   GLUCOSE  --  95 112* 95    Imaging/Diagnostic Tests: No results found.  Sherene Sires, DO 01/24/2018, 9:08 AM PGY-1, [place] Intern pager: [phone], text pages welcome

## 2018-01-24 NOTE — Progress Notes (Signed)
Progress Note  Patient Name: Roberto [name] Date of Encounter: 01/24/2018  Primary Cardiologist: Candee Furbish, MD   Subjective   Continues to make mild improvements with diuresis.  Decreasing shortness of breath.  No chest pain.  His knee was hurting this morning so he has not weighed yet.  Inpatient Medications    Scheduled Meds: . amLODipine  5 mg Oral Daily  . ferrous sulfate  325 mg Oral Q breakfast  . insulin aspart  0-9 Units Subcutaneous TID WC  . lidocaine  2 patch Transdermal Q24H  . multivitamin with minerals  1 tablet Oral Daily  . pantoprazole  40 mg Oral Daily  . sodium chloride flush  3 mL Intravenous Q12H  . torsemide  60 mg Oral Daily  . warfarin  12 mg Oral q1800  . Warfarin - Physician Dosing Inpatient   Does not apply q1800   Continuous Infusions: . sodium chloride     PRN Meds: sodium chloride, acetaminophen, ipratropium-albuterol, ondansetron (ZOFRAN) IV, sodium chloride flush   Vital Signs    Vitals:   01/23/18 1205 01/23/18 1922 01/24/18 0519 01/24/18 0839  BP: 105/65 102/64 112/67 136/74  Pulse: (!) 53 65 63   Resp:  18 18   Temp: 98.5 F (36.9 C) 98.4 F (36.9 C) 97.8 F (36.6 C)   TempSrc: Oral Oral Oral   SpO2: 95% 98% 94%   Weight:   (!) 373 lb 14.4 oz (169.6 kg)   Height:        Intake/Output Summary (Last 24 hours) at 01/24/2018 1115 Last data filed at 01/24/2018 1058 Gross per 24 hour  Intake 600 ml  Output 4570 ml  Net -3970 ml   Filed Weights   01/22/18 0628 01/23/18 1100 01/24/18 0519  Weight: (!) 376 lb 3.2 oz (170.6 kg) (!) 376 lb 11.2 oz (170.9 kg) (!) 373 lb 14.4 oz (169.6 kg)    Telemetry    Atrial fibrillation no pauses greater than 3 seconds, brief NSVT 3 days ago, 8 beats-occasional brady at night (OSA) Personally Reviewed. No changes  ECG    A. fib, bradycardic- Personally Reviewed  Physical Exam   GEN: Well nourished, well developed, in no acute distress obese HEENT: normal  Neck: no JVD, carotid bruits,  or masses Cardiac: RRR; no murmurs, rubs, or gallops, LE  edema improving. TED hose Respiratory:  clear to auscultation bilaterally, normal work of breathing GI: soft, nontender, nondistended, + BS MS: no deformity or atrophy  Skin: warm and dry, no rash Neuro:  Alert and Oriented x 3, Strength and sensation are intact Psych: euthymic mood, full affect    Labs    Chemistry Recent Labs  Lab 01/21/18 0544 01/22/18 0705 01/23/18 0406 01/24/18 0515  NA  --  142 139 138  K  --  4.2 4.1 4.1  CL  --  99* 98* 98*  CO2  --  33* 33* 30  GLUCOSE  --  95 112* 95  BUN  --  20 23* 25*  CREATININE  --  1.55* 1.55* 1.44*  CALCIUM  --  8.9 8.7* 8.5*  PROT 6.7  --   --   --   ALBUMIN 3.3*  --   --   --   AST 23  --   --   --   ALT 17  --   --   --   ALKPHOS 88  --   --   --   BILITOT 1.0  --   --   --  GFRNONAA  --  48* 48* 53*  GFRAA  --  56* 56* >60  ANIONGAP  --  _0 Hematology Recent Labs  Lab 01/21/18 0205 01/24/18 0515  WBC 8.1 7.8  RBC 5.18 5.05  HGB 14.8 14.5  HCT 48.1 46.6  MCV 92.9 92.3  MCH 28.6 28.7  MCHC 30.8 31.1  RDW 15.7* 15.6*  PLT 267 234    Cardiac Enzymes Recent Labs  Lab 01/21/18 0544 01/21/18 1718 01/22/18 0000  TROPONINI 0.03* 0.03* <0.03    Recent Labs  Lab 01/20/18 2116  TROPIPOC 0.03     BNP Recent Labs  Lab 01/21/18 0205  BNP 207.5*     DDimer No results for input(s): DDIMER in the last 168 hours.   Radiology    No results found.  Cardiac Studies   ECHO 2018 - Left ventricle: The cavity size was normal. There was severe   concentric hypertrophy. Systolic function was normal. The   estimated ejection fraction was in the range of 60% to 65%. Wall   motion was normal; there were no regional wall motion   abnormalities. - Aortic valve: There was trivial regurgitation. - Aorta: Ascending aorta diameter: 41 mm (ED). - Ascending aorta: The ascending aorta was mildly dilated. - Mitral valve: There was mild  regurgitation. - Right ventricle: The cavity size was severely dilated. Wall   thickness was normal. Systolic function was severely reduced. - Atrial septum: There was increased thickness of the septum,   consistent with lipomatous hypertrophy. - Pulmonic valve: There was mild regurgitation. - Pulmonary arteries: PA peak pressure: 86 mm Hg (S).  Impressions:  - The right ventricular systolic pressure was increased consistent   with severe pulmonary hypertension.  Patient Profile     57 y.o. male with permanent atrial fibrillation, morbid obesity, BMI of 54, obstructive sleep apnea compliant with CPAP, secondary pulmonary hypertension from obesity, chronic intake regulation because of repeat DVTs here with acute on chronic diastolic heart failure  Assessment & Plan    Acute on chronic diastolic heart failure/morbid obesity - Continuing with aggressive diuresis.  Weight down to 373. Continues with good diuresis. Creat improved 1.4. Let's keep it going.   Permanent atrial fibrillation - Unsuccessful cardioversion in the past.  He has been in atrial fibrillation for quite some time.  Continue with anticoagulation, warfarin.  Well rate controlled without AV nodal blocking agents.  Stable  Bradycardia -This is also been a long-standing finding for him.  At times with his obstructive sleep apnea, I would suspect significant bradycardia to occur due to hyper vagal response during the night.  Ultimately, does not require a pacemaker.  He is able to increase his heart rate adequately for movement.  Stable.    For questions or updates, please contact [place] Please consult [email or web address] for contact info under Cardiology/STEMI.      Signed, Candee Furbish, MD  01/24/2018, 11:15 AM

## 2018-01-25 ENCOUNTER — Ambulatory Visit (HOSPITAL_COMMUNITY): Payer: Medicare HMO

## 2018-01-25 LAB — CBC
HEMATOCRIT: 45.5 % (ref 39.0–52.0)
Hemoglobin: 14.7 g/dL (ref 13.0–17.0)
MCH: 29.3 pg (ref 26.0–34.0)
MCHC: 32.3 g/dL (ref 30.0–36.0)
MCV: 90.6 fL (ref 78.0–100.0)
PLATELETS: 234 10*3/uL (ref 150–400)
RBC: 5.02 MIL/uL (ref 4.22–5.81)
RDW: 15.5 % (ref 11.5–15.5)
WBC: 7 10*3/uL (ref 4.0–10.5)

## 2018-01-25 LAB — GLUCOSE, CAPILLARY
GLUCOSE-CAPILLARY: 128 mg/dL — AB (ref 65–99)
Glucose-Capillary: 105 mg/dL — ABNORMAL HIGH (ref 65–99)
Glucose-Capillary: 148 mg/dL — ABNORMAL HIGH (ref 65–99)
Glucose-Capillary: 149 mg/dL — ABNORMAL HIGH (ref 65–99)

## 2018-01-25 LAB — BASIC METABOLIC PANEL
ANION GAP: 9 (ref 5–15)
BUN: 29 mg/dL — AB (ref 6–20)
CHLORIDE: 98 mmol/L — AB (ref 101–111)
CO2: 30 mmol/L (ref 22–32)
Calcium: 8.6 mg/dL — ABNORMAL LOW (ref 8.9–10.3)
Creatinine, Ser: 1.51 mg/dL — ABNORMAL HIGH (ref 0.61–1.24)
GFR calc Af Amer: 57 mL/min — ABNORMAL LOW (ref >60)
GFR, EST NON AFRICAN AMERICAN: 50 mL/min — AB (ref >60)
GLUCOSE: 92 mg/dL (ref 65–99)
POTASSIUM: 4.1 mmol/L (ref 3.5–5.1)
Sodium: 137 mmol/L (ref 135–145)

## 2018-01-25 LAB — PROTIME-INR
INR: 2.69
Prothrombin Time: 28.4 seconds — ABNORMAL HIGH (ref 11.4–15.2)

## 2018-01-25 MED ORDER — CYCLOBENZAPRINE HCL 10 MG PO TABS
10.0000 mg | ORAL_TABLET | Freq: Three times a day (TID) | ORAL | Status: DC | PRN
  Administered 2018-01-25 – 2018-01-26 (×2): 10 mg via ORAL
  Filled 2018-01-25 (×2): qty 1

## 2018-01-25 MED ORDER — TRAMADOL HCL 50 MG PO TABS
50.0000 mg | ORAL_TABLET | Freq: Two times a day (BID) | ORAL | Status: DC
  Administered 2018-01-25: 50 mg via ORAL
  Filled 2018-01-25: qty 1

## 2018-01-25 MED ORDER — CYCLOBENZAPRINE HCL 5 MG PO TABS: 5.0000 mg | ORAL_TABLET | Freq: Three times a day (TID) | ORAL | Status: DC

## 2018-01-25 MED ORDER — BACLOFEN 5 MG HALF TABLET
5.0000 mg | ORAL_TABLET | Freq: Three times a day (TID) | ORAL | Status: DC
  Administered 2018-01-25 – 2018-01-26 (×3): 5 mg via ORAL
  Filled 2018-01-25 (×5): qty 1

## 2018-01-25 NOTE — Progress Notes (Signed)
Patient is in continuous pain. Lidocaine patch does not help.

## 2018-01-25 NOTE — Progress Notes (Signed)
Pt's HR dropped in the 20's nonsustained, pt asymptomatic. Dr. Pennie Rushing notified. Will continue to monitor.

## 2018-01-25 NOTE — Progress Notes (Signed)
Pt's had 3.01sec paused, HR down to 27 nonsustained, pt was asleep. Pt was awaken denied any symptoms, HR up to 50's. Family medicine on call notified, awaiting call back.

## 2018-01-25 NOTE — Progress Notes (Signed)
Pt's HR down to 24 nonsustained, pt HR went back to 40's after few seconds, had 3.8 sec paused. Pt asymptomatic. Family Medicine on call made aware, no new orders made. Will continue to monitor.

## 2018-01-25 NOTE — Progress Notes (Signed)
Patient has declined the CPAP tonight stating that he cannot use the CPAP unless we have nasal pillows. RT suggested that the patient have his family bring his home unit.

## 2018-01-25 NOTE — Progress Notes (Signed)
Family Medicine Teaching Service Daily Progress Note Intern Pager: [phone]  Patient name: Roberto [name] Medical record number: [id number] Date of birth: [birth year] Age: 57 y.o. Gender: male  Primary Care Provider: Sherene Sires, DO Consultants: None Code Status: Full  Pt Overview and Major Events to Date:  4/29 - admitted for dyspnea  Assessment and Plan: Roberto [name] is a 57 y.o. male presenting with 2 days of SOB and leg swelling. PMH is significant for atrial fibrillation, HFpEF with systolic and diastolic failure, history of DVT, morbid obesity, cor pulmonale, COPD, home oxygen 5 L nasal cannula, hypertension, GERD, venous insufficiency, diabetes mellitus.  Dyspnea likely due to mild CHF exacerbation- back to baseline, now complaining of cramping on R side Maintained sats on home 5L [place] overnight 5/2. Weight down to 371 5/3 indicates diuresis still steady. Down 3L on 5/2.   LE doppler unable to visualize DVT.  Echo showed Normal LV systolic function; moderate LVH; D shaped septum, mildly dilated ascending aorta; mild LAE and RAE; mild RVE with severely reduced RV function; moderate TR with severe pulmonary hypertension. - Cardiology consulted -continue IV Lasix 17m BID  -Continuous pulse ox, supplementary O2 as necessary -Monitor work of breathing -Vital signs per floor -Elevate legs -TED hose -strict I/O -RNCM to assist with bariatric scale for closer home management of weights  Asymptomatic bradycardia- multiple episodes to 20s overnight 5/2, recovered when awakened Cardiology consulted who stated patient had adequate chronotropic effect with movement and recommended no need for pacing at this time. TSH wnl. EKG with improved HR however with more significant T wave inversions likely due to demand as noted above. Mag and Phos levels wnl. - Cardiology consulted, appreciate recs -Telemetry -External pacing pads in place  AKI, 5/3 labs pending Cr 1.6>1.55>1.44 on 5/2.   Baseline appears to be 1.1-1.2.  Possibly elevated in the setting of decreased perfusion perfusion from CHF. Improving with diuresis. -Monitor creatinine, especially in setting of diuresis -Hold home lisinopril, other nephrotoxic agents  Atrial fibrillation Rate controlled, not on meds.  Patient chronically anticoagulated on warfarin 264ma day.  INR therapeutic at 2.8 on admission. -Telemetry -Continue home warfarin  HFpEF with Systolic and Diastolic failure  Cor pulmonale  Pulmonary hypertension As noted above, stable so far in admission.  Showed EF  60-65%, severe pulmonary hypertension with with right-sided systolic dysfunction.  Weight is down 6 pounds from last office visit in 10/2017. Taking Torsemide 4038maily at home.  Echo showed Normal LV systolic function; moderate LVH; D shaped septum; mildly dilated ascending aorta; mild LAE and RAE; mild RVE with severely reduced RV function; moderate TR with severe pulmonary hypertension. -Daily weights -Strict I's and O's -IV Lasix per Cards recs -Holding lisinopril in setting of AKI -obtaining bariatric scale for outpatient management -home health nurse recommended by heart failure team (ordered)  Hypertension- stable  Normotensive.  At home takes lisinopril 10 mg, amlodipine 5 mg -Monitor blood pressure in setting of diuresis -Continue home amlodipine -Holding lisinopril in setting of AKI  COPD- stable On 5 L nasal cannula at home.  Shortness of breath improved after albuterol. Currently on home O2, sating well. -Continuous pulse ox -Continue home duo nebs as needed -Supplemental oxygen as necessary  GERD- stable, no complaints currently Well-controlled on home PPI -Protonix daily  Diabetes mellitus. Stable, requiring >=2u /day of novalog CBG 97 this am, 2u aspart received last 24 hours. Last A1c 6.4 on 10/2017.  Takes metformin 1000 milligrams twice daily -Hold metformin inpatient -Sliding-scale insulin  sensitive  History of multiple left lower extremity DVT- 5/1 INR 2.57 Patient endorsing left lower extremity swelling worsening than right lower extremely swelling.  History of bilateral lower examine DVTs.  Patient had lower external Dopplers on 04/2017 that showed subacute DVT in femoral and popliteal veins lower extremity.  Patient is therapeutic on warfarin. LE Dopplers unable to visualize - warfarin per pharm  OSA  Patient uses CPAP at night. -CPAP nightly  FEN/GI: HHD/ carb modified Prophylaxis: Warfarin  Disposition:inpatient management of volume overload, diuresis still continuing although with new complaints about diuresis may consider d/c  Subjective:  Patient with right sided pain.  He thinks it is his kidney as he thinks it gets better with drinking water.  Objective: Temp:  [98 F (36.7 C)-98.5 F (36.9 C)] 98 F (36.7 C) (05/03 0603) Pulse Rate:  [59-66] 59 (05/03 0603) Resp:  [18] 18 (05/03 0603) BP: (113-136)/(66-77) 113/66 (05/03 0603) SpO2:  [95 %-100 %] 95 % (05/03 0603) Weight:  [371 lb 4.8 oz (168.4 kg)] 371 lb 4.8 oz (168.4 kg) (05/03 0603) Physical Exam: General: pleasant morbidly obese male, in NAD on 5L [place] (home dose) Cardiovascular: regular rate, 2/6 murmur Respiratory: course lung sounds at baseline, no wheezes. Same as 5/2 Abdomen: obese abdomen, soft, non tender to palpation MSK: knees with chronic pain consistent with arthritis, no erythema/lesions.   Extremities:1 + pitting edema above compression socks, 1+ underneath.    Laboratory: Recent Labs  Lab 01/21/18 0205 01/24/18 0515  WBC 8.1 7.8  HGB 14.8 14.5  HCT 48.1 46.6  PLT 267 234   Recent Labs  Lab 01/21/18 0544 01/22/18 0705 01/23/18 0406 01/24/18 0515  NA  --  142 139 138  K  --  4.2 4.1 4.1  CL  --  99* 98* 98*  CO2  --  33* 33* 30  BUN  --  20 23* 25*  CREATININE  --  1.55* 1.55* 1.44*  CALCIUM  --  8.9 8.7* 8.5*  PROT 6.7  --   --   --   BILITOT 1.0  --   --   --    ALKPHOS 88  --   --   --   ALT 17  --   --   --   AST 23  --   --   --   GLUCOSE  --  95 112* 95    Imaging/Diagnostic Tests: No results found.  Sherene Sires, DO 01/25/2018, 6:18 AM PGY-1, [place] Intern pager: [phone], text pages welcome

## 2018-01-25 NOTE — Progress Notes (Signed)
Pt is experiencing Right side flank pain. Refused lasix and md is aware. Does not want to get out of bed because of pain. Tylenol does not help will apply lidocaine as orderedp plan to discharge later

## 2018-01-25 NOTE — Care Management Note (Signed)
Case Management Note  Patient Details  Name: Roberto [name] [place] MRN: [id number] Date of Birth: 08-Aug-1961  Subjective/Objective:     CHF              Action/Plan: CM talked to patient about [place] services, patient refused all [place] stated " I didn't need it before I came to the hospital and I don't plan to have it when I leave." Has home oxygen and CPAP at home. Patient continuously complaining of back pain; Attending MD is aware, medication adjustments.  Expected Discharge Date:  Possibly 01/26/2018           Expected Discharge Plan:  [place] but Refused at this time  In-House Referral:  Clinical Social Work  Status of Service:  In process, will continue to follow  Sherrilyn Rist [phone] 01/25/2018, 2:44 PM

## 2018-01-25 NOTE — Progress Notes (Signed)
Patient is alert and orient experiencing Right-sided flank pain. Pt refused lasix until doctor check his abdomen pain ideation. Pending labs today.

## 2018-01-25 NOTE — Progress Notes (Signed)
Progress Note  Patient Name: Roberto [name] Date of Encounter: 01/25/2018  Primary Cardiologist: Candee Furbish, MD   Subjective   This morning when I walked into his room, he had just laid back down to his bed and he was in obvious discomfort, stating that his right kidney was hurting.  Yesterday he mentioned this and I explained that this was most likely musculoskeletal discomfort.  He states that when he tries to drink water more water comes out and he usually feels that more water helps him with his kidney discomfort.  He states that he stood up well just recently.  No fevers, no shakes.  Inpatient Medications    Scheduled Meds: . amLODipine  5 mg Oral Daily  . ferrous sulfate  325 mg Oral Q breakfast  . furosemide  60 mg Intravenous BID  . insulin aspart  0-9 Units Subcutaneous TID WC  . lidocaine  2 patch Transdermal Q24H  . multivitamin with minerals  1 tablet Oral Daily  . pantoprazole  40 mg Oral Daily  . sodium chloride flush  3 mL Intravenous Q12H  . warfarin  12 mg Oral q1800  . Warfarin - Physician Dosing Inpatient   Does not apply q1800   Continuous Infusions: . sodium chloride     PRN Meds: sodium chloride, acetaminophen, ipratropium-albuterol, ondansetron (ZOFRAN) IV, sodium chloride flush   Vital Signs    Vitals:   01/24/18 0839 01/24/18 1148 01/24/18 1941 01/25/18 0603  BP: 136/74 113/73 126/77 113/66  Pulse:  62 66 (!) 59  Resp:   18 18  Temp:  98.5 F (36.9 C) 98 F (36.7 C) 98 F (36.7 C)  TempSrc:  Oral Oral Oral  SpO2:  99% 100% 95%  Weight:    (!) 371 lb 4.8 oz (168.4 kg)  Height:        Intake/Output Summary (Last 24 hours) at 01/25/2018 8469 Last data filed at 01/25/2018 0552 Gross per 24 hour  Intake 240 ml  Output 3250 ml  Net -3010 ml   Filed Weights   01/23/18 1100 01/24/18 0519 01/25/18 0603  Weight: (!) 376 lb 11.2 oz (170.9 kg) (!) 373 lb 14.4 oz (169.6 kg) (!) 371 lb 4.8 oz (168.4 kg)    Telemetry    Atrial fibrillation no  pauses greater than 3 seconds, brief NSVT 4 days ago, 8 beats-occasional brady at night (OSA) Personally Reviewed. No changes  ECG    A. fib, bradycardic, no change/occasional likely vagal mediated/obstructive sleep apnea mediated bradycardia during night- Personally Reviewed  Physical Exam    GEN: Well nourished, well developed, with right flank pain obese HEENT: normal  Neck: no JVD, carotid bruits, or masses Cardiac: irreg; no murmurs, rubs, or gallops,TED hose, 2+ LE edema  Respiratory:  clear to auscultation bilaterally, normal work of breathing GI: soft, nontender, nondistended, + BS MS: no deformity or atrophy  Skin: warm and dry, no rash Neuro:  Alert and Oriented x 3, Strength and sensation are intact Psych: euthymic mood, full affect     Labs    Chemistry Recent Labs  Lab 01/21/18 0544 01/22/18 0705 01/23/18 0406 01/24/18 0515  NA  --  142 139 138  K  --  4.2 4.1 4.1  CL  --  99* 98* 98*  CO2  --  33* 33* 30  GLUCOSE  --  95 112* 95  BUN  --  20 23* 25*  CREATININE  --  1.55* 1.55* 1.44*  CALCIUM  --  8.9 8.7* 8.5*  PROT 6.7  --   --   --   ALBUMIN 3.3*  --   --   --   AST 23  --   --   --   ALT 17  --   --   --   ALKPHOS 88  --   --   --   BILITOT 1.0  --   --   --   GFRNONAA  --  48* 48* 53*  GFRAA  --  56* 56* >60  ANIONGAP  --  _0 Hematology Recent Labs  Lab 01/21/18 0205 01/24/18 0515  WBC 8.1 7.8  RBC 5.18 5.05  HGB 14.8 14.5  HCT 48.1 46.6  MCV 92.9 92.3  MCH 28.6 28.7  MCHC 30.8 31.1  RDW 15.7* 15.6*  PLT 267 234    Cardiac Enzymes Recent Labs  Lab 01/21/18 0544 01/21/18 1718 01/22/18 0000  TROPONINI 0.03* 0.03* <0.03    Recent Labs  Lab 01/20/18 2116  TROPIPOC 0.03     BNP Recent Labs  Lab 01/21/18 0205  BNP 207.5*     DDimer No results for input(s): DDIMER in the last 168 hours.   Radiology    No results found.  Cardiac Studies   ECHO 2018 - Left ventricle: The cavity size was normal. There  was severe   concentric hypertrophy. Systolic function was normal. The   estimated ejection fraction was in the range of 60% to 65%. Wall   motion was normal; there were no regional wall motion   abnormalities. - Aortic valve: There was trivial regurgitation. - Aorta: Ascending aorta diameter: 41 mm (ED). - Ascending aorta: The ascending aorta was mildly dilated. - Mitral valve: There was mild regurgitation. - Right ventricle: The cavity size was severely dilated. Wall   thickness was normal. Systolic function was severely reduced. - Atrial septum: There was increased thickness of the septum,   consistent with lipomatous hypertrophy. - Pulmonic valve: There was mild regurgitation. - Pulmonary arteries: PA peak pressure: 86 mm Hg (S).  Impressions:  - The right ventricular systolic pressure was increased consistent   with severe pulmonary hypertension.  Patient Profile     57 y.o. male with permanent atrial fibrillation, morbid obesity, BMI of 54, obstructive sleep apnea compliant with CPAP, secondary pulmonary hypertension from obesity, chronic intake regulation because of repeat DVTs here with acute on chronic diastolic heart failure  Assessment & Plan    Acute on chronic diastolic heart failure/morbid obesity - Continuing with aggressive diuresis.  Decreased another net 3 L yesterday.  Weight down to 371 from 382. Continues with good diuresis. Creat improved 1.4.  As long as his creatinine remained stable and he is willing to stay, I would normally continue to recommend further aggressive diuresis, however he feels as though his right kidney is being affected by the diuresis.  I think it would be challenging for [place] to continue to recommend further diuresis at this point.  Hopefully his discomfort is musculoskeletal.  Primary team may wish to further evaluate if they feel as though it is necessary.  Nonetheless, he has been here for several days, received very aggressive diuresis and  it would not be unreasonable to allow him to go home at this point.  His breathing is stable.  He was taking 40 mg of torsemide at bedtime at home according to his medications.  Continue with this.  Continue to advocate 1 to 1.5 L  of fluid as well as salt restriction.  Permanent atrial fibrillation - Unsuccessful cardioversion in the past.  He has been in atrial fibrillation for quite some time.  Continue with anticoagulation, warfarin.  Well rate controlled without AV nodal blocking agents.  Stable, no changes.  Telemetry stable.  Bradycardia -This is also been a long-standing finding for him.  At times with his obstructive sleep apnea, I would suspect significant bradycardia to occur due to hyper vagal response during the night.  Ultimately, does not require a pacemaker.  He is able to increase his heart rate adequately for movement.  Stable.  No changes.  Morbid obesity - Continue to advocate weight loss.  Ultimately this is the crux of many of his medical issues.    For questions or updates, please contact [place] Please consult [email or web address] for contact info under Cardiology/STEMI.      Signed, Candee Furbish, MD  01/25/2018, 6:32 AM

## 2018-01-26 LAB — CBC
HEMATOCRIT: 45.5 % (ref 39.0–52.0)
Hemoglobin: 14.3 g/dL (ref 13.0–17.0)
MCH: 28.9 pg (ref 26.0–34.0)
MCHC: 31.4 g/dL (ref 30.0–36.0)
MCV: 91.9 fL (ref 78.0–100.0)
PLATELETS: 239 10*3/uL (ref 150–400)
RBC: 4.95 MIL/uL (ref 4.22–5.81)
RDW: 15 % (ref 11.5–15.5)
WBC: 7.5 10*3/uL (ref 4.0–10.5)

## 2018-01-26 LAB — BASIC METABOLIC PANEL
ANION GAP: 9 (ref 5–15)
BUN: 25 mg/dL — AB (ref 6–20)
CALCIUM: 8.9 mg/dL (ref 8.9–10.3)
CO2: 33 mmol/L — ABNORMAL HIGH (ref 22–32)
CREATININE: 1.31 mg/dL — AB (ref 0.61–1.24)
Chloride: 93 mmol/L — ABNORMAL LOW (ref 101–111)
GFR calc Af Amer: >60 mL/min (ref >60)
GFR, EST NON AFRICAN AMERICAN: 59 mL/min — AB (ref >60)
GLUCOSE: 96 mg/dL (ref 65–99)
Potassium: 4.3 mmol/L (ref 3.5–5.1)
Sodium: 135 mmol/L (ref 135–145)

## 2018-01-26 LAB — GLUCOSE, CAPILLARY
GLUCOSE-CAPILLARY: 142 mg/dL — AB (ref 65–99)
Glucose-Capillary: 89 mg/dL (ref 65–99)
Glucose-Capillary: 102 mg/dL — ABNORMAL HIGH (ref 65–99)

## 2018-01-26 LAB — PROTIME-INR
INR: 2.61
Prothrombin Time: 27.7 seconds — ABNORMAL HIGH (ref 11.4–15.2)

## 2018-01-26 MED ORDER — TORSEMIDE 20 MG PO TABS: 60.0000 mg | ORAL_TABLET | Freq: Every day | ORAL | 0 refills | Status: DC

## 2018-01-26 MED ORDER — BACLOFEN 5 MG PO TABS: 5.0000 mg | ORAL_TABLET | Freq: Three times a day (TID) | ORAL | 0 refills | Status: DC

## 2018-01-26 MED ORDER — HYDROCODONE-ACETAMINOPHEN 5-325 MG PO TABS: 1.0000 | ORAL_TABLET | Freq: Four times a day (QID) | ORAL | 0 refills | Status: DC | PRN

## 2018-01-26 MED ORDER — CYCLOBENZAPRINE HCL 10 MG PO TABS: 10.0000 mg | ORAL_TABLET | Freq: Three times a day (TID) | ORAL | 0 refills | Status: DC | PRN

## 2018-01-26 NOTE — Progress Notes (Signed)
Pt is ready and stable for discharge. NT is taking patient out to his car.

## 2018-01-26 NOTE — Progress Notes (Signed)
Waiting on callback from Dr. Criss Rosales before discharging patient.

## 2018-01-26 NOTE — Progress Notes (Addendum)
Rolling walker with seat ordered through [place]; Jermane with [facility] will deliver the walker to his room prior to discharging home today; Aneta Mins [phone]  3:13 pm - Received call from [facility] with [facility], due to patient weight/size, [facility] will deliver the [place] to his home; Patient is aware. Mindi Slicker [facility] [phone]

## 2018-01-26 NOTE — Progress Notes (Signed)
Reviewed discharge instructions with my patient. Answered his questions. Rollator will be delivered to patient's home. Pt is feeling better and ready for discharge.

## 2018-01-28 ENCOUNTER — Other Ambulatory Visit: Payer: Self-pay | Admitting: *Deleted

## 2018-01-28 DIAGNOSIS — R0902 Hypoxemia: Secondary | ICD-10-CM | POA: Diagnosis not present

## 2018-01-28 DIAGNOSIS — R0600 Dyspnea, unspecified: Secondary | ICD-10-CM | POA: Diagnosis not present

## 2018-01-28 DIAGNOSIS — I509 Heart failure, unspecified: Secondary | ICD-10-CM | POA: Diagnosis not present

## 2018-01-28 DIAGNOSIS — G4733 Obstructive sleep apnea (adult) (pediatric): Secondary | ICD-10-CM | POA: Diagnosis not present

## 2018-01-28 DIAGNOSIS — J9621 Acute and chronic respiratory failure with hypoxia: Secondary | ICD-10-CM | POA: Diagnosis not present

## 2018-01-28 NOTE — Patient Outreach (Signed)
[facility]) Care Management  01/28/2018  Roberto [name] [birth year] [id number]   Referral received from hospital liaison as member was recently admitted 4/28-5/4 with bradycardia/atrial fibrillation with slow ventricular response.  Primary MD office will do transition of care assessment.  Per chart, he also has history of heart failure, DVT, hypertension, GERD, diabetes, and morbid obesity.  Call placed to member, he state he does not have time to talk, request call back later this afternoon.  Valente David, [place], MSN [place] [phone]

## 2018-01-29 ENCOUNTER — Other Ambulatory Visit: Payer: Self-pay | Admitting: Family Medicine

## 2018-01-29 DIAGNOSIS — I10 Essential (primary) hypertension: Secondary | ICD-10-CM

## 2018-01-31 ENCOUNTER — Other Ambulatory Visit: Payer: Self-pay | Admitting: *Deleted

## 2018-01-31 NOTE — Progress Notes (Signed)
  This encounter was created in error - please disregard.

## 2018-01-31 NOTE — Patient Outreach (Signed)
Referral received hospital liason, primary MD completes transition of care, pt hospitalized with bradycardia/ A-fib, history of CHF, HTN, morbid obesity, DM 2, telephone call to pt [phone], no answer to telephone, left voicemail requesting return phone call.  Mailed unsuccessful outreach letter.  PLAN Outreach pt in 3 business days  Jacqlyn Larsen [facility], BSN [facility] [employer] [phone]

## 2018-02-01 DIAGNOSIS — I509 Heart failure, unspecified: Secondary | ICD-10-CM | POA: Diagnosis not present

## 2018-02-01 DIAGNOSIS — J9621 Acute and chronic respiratory failure with hypoxia: Secondary | ICD-10-CM | POA: Diagnosis not present

## 2018-02-01 DIAGNOSIS — G4733 Obstructive sleep apnea (adult) (pediatric): Secondary | ICD-10-CM | POA: Diagnosis not present

## 2018-02-01 DIAGNOSIS — R0902 Hypoxemia: Secondary | ICD-10-CM | POA: Diagnosis not present

## 2018-02-05 ENCOUNTER — Other Ambulatory Visit: Payer: Self-pay | Admitting: *Deleted

## 2018-02-05 NOTE — Progress Notes (Signed)
CALL PAGER [phone] for any questions or notifications regarding this patient  FMTS Attending Note: Dorcas Mcmurray MD Clarification: AKI indicates Acute Kidney Injury as evidenced by creatinine elevated to 1.6 above baseline 1.2.

## 2018-02-05 NOTE — Patient Outreach (Signed)
Referral received 01/30/18 from hospital liason, pt hospitalized 4/28-01/26/18 principle diagnosis bradycardia, history CHF, HTN, morbid obesity, diabetes type 2, spoke with pt today, HIPAA verified, (Primary care MD completes transition of care), screening completed, pt states he received a scale from the hospital and is weighing daily with weight today 368 pounds, pt states he has medications and taking as prescribed, states " I haven't really needed the muscle relaxer"  Pt reports he has moved into new senior apartment " and I love it here, I was homeless and living in a car"  Pt reports he has everything in place now and neighbors around to talk to and gets outside and walks everyday.  Pt reports he checks CBG BID and "they've all been good, 101 today and highest 116"  Pt reports he discussed with MD that he has not been taking metformin because blood sugar is good.  Pt reports he knows about the HF action plan and does not feel he needs further education on this.  RN CM offered to see pt for home visit this week and pt declines citing " I'm doing so much better and I really don't think I need it"  RN CM offered again and pt declines services including CSW and pharmacy, pt states he already has the 24 hour nurse line magnet and contact information.  RN CM mailed case closure letter to patient's home and faxed case closure letter to primary care provider [facility], [place] Coordinator [phone]

## 2018-02-12 ENCOUNTER — Ambulatory Visit (INDEPENDENT_AMBULATORY_CARE_PROVIDER_SITE_OTHER): Payer: Medicare HMO | Admitting: Family Medicine

## 2018-02-12 ENCOUNTER — Other Ambulatory Visit: Payer: Self-pay

## 2018-02-12 ENCOUNTER — Encounter: Payer: Self-pay | Admitting: Family Medicine

## 2018-02-12 VITALS — BP 120/72 | HR 84 | Temp 98.7°F | Ht 72.0 in | Wt 376.0 lb

## 2018-02-12 DIAGNOSIS — E1142 Type 2 diabetes mellitus with diabetic polyneuropathy: Secondary | ICD-10-CM | POA: Diagnosis not present

## 2018-02-12 DIAGNOSIS — I10 Essential (primary) hypertension: Secondary | ICD-10-CM

## 2018-02-12 LAB — POCT GLYCOSYLATED HEMOGLOBIN (HGB A1C): HEMOGLOBIN A1C: 6.9 % — AB (ref 4.0–5.6)

## 2018-02-12 MED ORDER — TORSEMIDE 20 MG PO TABS: 60.0000 mg | ORAL_TABLET | Freq: Every day | ORAL | 1 refills | Status: DC

## 2018-02-12 NOTE — Patient Instructions (Addendum)
It was a pleasure to see you today! Thank you for choosing [facility] for your primary care. Roberto [name] was seen for hospital followup. Come back to the clinic if you have any new concerns, and go to the emergency room if you have any life threatening symptoms.  Today we talked about your new apartment and how well your walking has been going.  We're also glad to here your back spasms have been doing better.     We tallked about your A1C being slightly increased to 6.9 and your concerns about metformin as a medication.  If we did any lab work today, and the results require attention, either me or my nurse will get in touch with you. If everything is normal, you will get a letter in mail and a message via . If you don't hear from [place] in two weeks, please give [place] a call. Otherwise, we look forward to seeing you again at your next visit. If you have any questions or concerns before then, please call the clinic at [phone].  Please bring all your medications to every doctors visit  Sign up for My Chart to have easy access to your labs results, and communication with your Primary care physician.    Please check-out at the front desk before leaving the clinic.    Best,  Dr. Sherene Sires FAMILY MEDICINE RESIDENT - PGY1 02/12/2018 3:08 PM

## 2018-02-12 NOTE — Progress Notes (Signed)
    Subjective:  Roberto [name] is a 57 y.o. male who presents to the [facility] today with a chief complaint of hospital followup.   HPI: Much improved.  Has arranged new housing at a senior living complex and has been eating better and walking regularly.  Has been taking all meds as prescribed and using home 5L [place]  His complaints of back spasm are resolved and he hasn't been using the flexeril/baclofen.  He did not fill the norco prescription.    Objective:  Physical Exam: BP 120/72   Pulse 84   Temp 98.7 F (37.1 C) (Oral)   Ht 6' (1.829 m)   Wt (!) 376 lb (170.6 kg)   SpO2 (!) 88% Comment: on 3 liters  BMI 50.99 kg/m   Gen: NAD, resting comfortably, was satting at 88% which is baseline for him CV: normal rate, irregular rhythm, 3/6 murmur Pulm: Mild WOB at baseline, CTAB with no crackles, wheezes, or rhonchi, on 4L [place] MSK: no edema, cyanosis, or clubbing noted Skin: warm, dry Neuro: grossly normal, moves all extremities Psych: Normal affect and thought content 3lbs above dry weight  Results for orders placed or performed in visit on 02/12/18 (from the past 72 hour(s))  POCT glycosylated hemoglobin (Hb A1C)     Status: Abnormal   Collection Time: 02/12/18  3:01 PM  Result Value Ref Range   Hemoglobin A1C 6.9 (A) 4.0 - 5.6 %   HbA1c, POC (prediabetic range)  5.7 - 6.4 %   HbA1c, POC (controlled diabetic range)  0.0 - 7.0 %     Assessment/Plan:  Essential hypertension Updated and refilled torsemide to reflect new dose of 60daily  Type 2 diabetes mellitus with diabetic polyneuropathy, without long-term current use of insulin (HCC) Hgb A1C 6.9 up from 6.4.  Had stopped taking metformin.  Would like to continue diet/activity for treatment and avoid meds but will consider metformin potentially at a lower dose.   Will discuss again on next visit.   Sherene Sires, DO FAMILY MEDICINE RESIDENT - PGY1 02/13/2018 10:18 AM

## 2018-02-13 NOTE — Assessment & Plan Note (Signed)
Hgb A1C 6.9 up from 6.4.  Had stopped taking metformin.  Would like to continue diet/activity for treatment and avoid meds but will consider metformin potentially at a lower dose.   Will discuss again on next visit.

## 2018-02-13 NOTE — Assessment & Plan Note (Signed)
Updated and refilled torsemide to reflect new dose of 60daily

## 2018-02-28 DIAGNOSIS — R0600 Dyspnea, unspecified: Secondary | ICD-10-CM | POA: Diagnosis not present

## 2018-02-28 DIAGNOSIS — R0902 Hypoxemia: Secondary | ICD-10-CM | POA: Diagnosis not present

## 2018-02-28 DIAGNOSIS — J9621 Acute and chronic respiratory failure with hypoxia: Secondary | ICD-10-CM | POA: Diagnosis not present

## 2018-02-28 DIAGNOSIS — I509 Heart failure, unspecified: Secondary | ICD-10-CM | POA: Diagnosis not present

## 2018-02-28 DIAGNOSIS — G4733 Obstructive sleep apnea (adult) (pediatric): Secondary | ICD-10-CM | POA: Diagnosis not present

## 2018-03-04 DIAGNOSIS — J9621 Acute and chronic respiratory failure with hypoxia: Secondary | ICD-10-CM | POA: Diagnosis not present

## 2018-03-04 DIAGNOSIS — G4733 Obstructive sleep apnea (adult) (pediatric): Secondary | ICD-10-CM | POA: Diagnosis not present

## 2018-03-04 DIAGNOSIS — R0902 Hypoxemia: Secondary | ICD-10-CM | POA: Diagnosis not present

## 2018-03-04 DIAGNOSIS — I509 Heart failure, unspecified: Secondary | ICD-10-CM | POA: Diagnosis not present

## 2018-03-08 ENCOUNTER — Encounter: Payer: Self-pay | Admitting: Family Medicine

## 2018-03-08 ENCOUNTER — Ambulatory Visit (INDEPENDENT_AMBULATORY_CARE_PROVIDER_SITE_OTHER): Payer: Medicare HMO | Admitting: Family Medicine

## 2018-03-08 ENCOUNTER — Other Ambulatory Visit: Payer: Self-pay

## 2018-03-08 VITALS — BP 118/74 | HR 52 | Temp 98.4°F | Ht 72.0 in | Wt 364.4 lb

## 2018-03-08 DIAGNOSIS — M7989 Other specified soft tissue disorders: Secondary | ICD-10-CM | POA: Diagnosis not present

## 2018-03-08 MED ORDER — VITAMIN D 1000 UNITS PO TABS: 1000.0000 [IU] | ORAL_TABLET | Freq: Every day | ORAL | 2 refills | Status: AC

## 2018-03-08 MED ORDER — ACETAMINOPHEN 325 MG PO TABS: 650.0000 mg | ORAL_TABLET | Freq: Four times a day (QID) | ORAL | 0 refills | Status: DC | PRN

## 2018-03-08 NOTE — Patient Instructions (Signed)
Thank you for coming in today, it was so nice to see you! Today we talked about:    Hand swelling: We are unsure what is causing your hand swelling.  It is not likely that you have an infection or anything broken.  Most likely there is some inflammation in your hand just from repetitive hand movements.  I suggest using ice on your hand at least 3 times a day for 10 minutes at a time.  You can take the Tylenol as needed.  Please go to the emergency room if your hand pain gets worse, you noticed that there is more redness in your hand, or you cannot feel your hand  Please follow up next week. You can schedule this appointment at the front desk before you leave or call the clinic.  If you have any questions or concerns, please do not hesitate to call the office at [phone]. You can also message me directly via MyChart.   Sincerely,  Smitty Cords, MD

## 2018-03-08 NOTE — Progress Notes (Signed)
Subjective:    Patient ID: Roberto [name] , male   DOB: 28-Feb-1961 , 57 y.o..   MRN: [id number]  HPI  Roberto [name] is a 57 year old male with past medical history of cor pulmonale with pulmonary hypertension per echocardiogram April 2019, chronic respiratory failure on 5L [place], morbid obesity, hypertension, A. fib on Coumadin here for  Chief Complaint  Patient presents with  . hand swelling    1. Hand swelling: Patient endorses swelling of his right hand about 3 days.  He notes that he just woke up with the swelling and cannot remember any trauma to his hand.  Nothing like this is ever happened before.  He admits to some pins-and-needles feeling when it first started but that has since gone away.  The swelling limits his grip strength.  His middle finger hurts when he tries to flex it.  He denies any skin changes of the hand or any fevers.  He does endorse increased activity using his hands as he is currently moving and has been opening and closing boxes and lifting heavy objects and furniture.  Of note patient does have cor pulmonale with pulmonary hypertension and is on torsemide 60 mg daily.  He also has chronic respiratory failure on 5 L nasal cannula.  He is anticoagulated with Coumadin; history of DVT and A. fib.  Review of Systems: Per HPI.   Past Medical History: Patient Active Problem List   Diagnosis Date Noted  . Solitary pulmonary nodule 06/20/2017  . Anticoagulation management encounter 06/05/2017  . GERD without esophagitis 05/10/2017  . Acute deep vein thrombosis (DVT) of femoral vein of left lower extremity ([place])   . Essential hypertension   . Morbid obesity ([place])   . Dyspnea   . Bradycardia   . Saphenous vein clot, left 05/04/2017  . Cor pulmonale, chronic ([place]) 05/04/2017  . Type 2 diabetes mellitus with diabetic polyneuropathy, without long-term current use of insulin ([place])   . Chronic respiratory failure with hypoxia ([place])   . Acute deep vein thrombosis (DVT) of  proximal vein of left lower extremity ([place])   . Combined congestive systolic and diastolic heart failure ([place])   . Atrial fibrillation with slow ventricular response ([place])   . Community acquired pneumonia     Medications: reviewed   Social Hx:  reports that he has never smoked. He has never used smokeless tobacco.   Objective:   BP 118/74   Pulse (!) 52   Temp 98.4 F (36.9 C) (Oral)   Ht 6' (1.829 m)   Wt (!) 364 lb 6.4 oz (165.3 kg)   SpO2 92%   BMI 49.42 kg/m  Physical Exam  Gen: NAD, alert, cooperative with exam, elevated BMI HEENT: NCAT, PERRL, clear conjunctiva, oropharynx clear, supple neck, nasal cannula in place MSK: Right hand shows diffuse swelling that does not extend past the wrist, no erythema or skin lesions seen.  No tenderness to palpation.  Limited digit flexion of all digits 1 through 5. Normal extension of digits and of wrist. Examination of strength limited due to limited range of motion.  Neurovascularly intact  Assessment & Plan:   1. Swelling of right hand: Hand is diffusely swollen without any evidence of cellulitis, gout, DVT, bone fracture.  Less likely fluid overload from CHF as his weight is actually down and this would be unlikely to be unilateral. He is neurovascularly intact.  Unclear etiology at this time.  Does endorse increased activity with using his hand with moving boxes  and moving furniture, wondering if him being on chronic Coumadin could be contributing to the swelling in some way.  Symptoms have been apparently been improving since they first started which is reassuring. -Advised patient to continue taking Tylenol as this does provide some pain relief for him -He will ice the hand every 3 times a day for 10 minutes at a time -Follow-up in 1 week to ensure improvement  Smitty Cords, MD [place], PGY-3

## 2018-03-14 ENCOUNTER — Ambulatory Visit: Payer: Medicare HMO | Admitting: Family Medicine

## 2018-03-30 DIAGNOSIS — R0600 Dyspnea, unspecified: Secondary | ICD-10-CM | POA: Diagnosis not present

## 2018-03-30 DIAGNOSIS — I509 Heart failure, unspecified: Secondary | ICD-10-CM | POA: Diagnosis not present

## 2018-04-03 DIAGNOSIS — R0902 Hypoxemia: Secondary | ICD-10-CM | POA: Diagnosis not present

## 2018-04-03 DIAGNOSIS — J9621 Acute and chronic respiratory failure with hypoxia: Secondary | ICD-10-CM | POA: Diagnosis not present

## 2018-04-03 DIAGNOSIS — I509 Heart failure, unspecified: Secondary | ICD-10-CM | POA: Diagnosis not present

## 2018-04-03 DIAGNOSIS — G4733 Obstructive sleep apnea (adult) (pediatric): Secondary | ICD-10-CM | POA: Diagnosis not present

## 2018-04-09 ENCOUNTER — Ambulatory Visit (INDEPENDENT_AMBULATORY_CARE_PROVIDER_SITE_OTHER): Payer: Medicare HMO | Admitting: Family Medicine

## 2018-04-09 ENCOUNTER — Other Ambulatory Visit: Payer: Self-pay

## 2018-04-09 ENCOUNTER — Ambulatory Visit (INDEPENDENT_AMBULATORY_CARE_PROVIDER_SITE_OTHER): Payer: Medicare HMO | Admitting: *Deleted

## 2018-04-09 ENCOUNTER — Encounter: Payer: Self-pay | Admitting: Family Medicine

## 2018-04-09 VITALS — BP 110/60 | HR 70 | Temp 98.2°F | Ht 72.0 in | Wt 369.0 lb

## 2018-04-09 DIAGNOSIS — R0600 Dyspnea, unspecified: Secondary | ICD-10-CM | POA: Diagnosis not present

## 2018-04-09 DIAGNOSIS — I4891 Unspecified atrial fibrillation: Secondary | ICD-10-CM

## 2018-04-09 DIAGNOSIS — Z5181 Encounter for therapeutic drug level monitoring: Secondary | ICD-10-CM

## 2018-04-09 DIAGNOSIS — Z7901 Long term (current) use of anticoagulants: Secondary | ICD-10-CM | POA: Diagnosis not present

## 2018-04-09 DIAGNOSIS — G5623 Lesion of ulnar nerve, bilateral upper limbs: Secondary | ICD-10-CM | POA: Diagnosis not present

## 2018-04-09 DIAGNOSIS — I82412 Acute embolism and thrombosis of left femoral vein: Secondary | ICD-10-CM

## 2018-04-09 DIAGNOSIS — J9611 Chronic respiratory failure with hypoxia: Secondary | ICD-10-CM

## 2018-04-09 LAB — POCT INR: INR: 4.7 — AB (ref 2.0–3.0)

## 2018-04-09 NOTE — Patient Instructions (Signed)
It was a pleasure to see you today! Thank you for choosing [facility] for your primary care. Arrow Tomko was seen for wnating PT and ENT referal. Come back to the clinic if you need to discuss any further symptoms, and go to the emergency room if you have any life threatening concerns.  Today we talked about your referals and how your hand placement on the walker is the likely cause of your hand numbness near your little finger.  Consider raising the hand height on your roller and discussing the issue with PT for some other ergonomic suggestions     If we did any lab work today that did not result today, one of two things will happen.  1. If everything is normal, you will get a letter in mail sent to the address in your chart with the results for your records.  It is important to keep your address up to date as that is where we will send results.  2. If the results require some sort of discussion, my nurses or myself will call you on the phone number listed in your records.  It is important to keep your phone number up to date in our system as this is how we will try to reach you.  If we cannot reach you on the phone, we will try to send you a letter in the mail so please enable to voicemail function of your phone.  If you don't hear from [place] in two weeks, please give [place] a call to verify your results. Otherwise, we look forward to seeing you again at your next visit. If you have any questions or concerns before then, please call the clinic at [phone].   Please bring all your medications to every doctors visit   Sign up for My Chart to have easy access to your labs results, and communication with your Primary care physician.     Please check-out at the front desk before leaving the clinic.     Best,  Dr. Sherene Sires FAMILY MEDICINE RESIDENT - PGY2 04/09/2018 3:17 PM

## 2018-04-09 NOTE — Progress Notes (Signed)
    Subjective:  Roberto [name] is a 57 y.o. male who presents to the [facility] today with a chief complaint of wanting referal renewed to PT and one to ENT for concern of narrow nasal passages impacting breathing.   HPI: Patient has been walking more regularly since he moved into a new apt and while he enjoyed working with PT, they have told him he needs a new referal to continue seeing them.  His only new physical complaint is nerve pain from wrist on ulnar side continuings distally through his little finger.  This is on both sides and worse when he uses his walker and not his cane.  No specific trauma and no wounds claimed.  No proximal conerns and no paralysis is claimed.  He wants an ENT referal because he says he thinks his nasal passages are too narrow.  They seem "blocked" up even when he has no rhinorhea or congestion.  He wonders if chronic O2 has caused some inflammation in his nostrils.  Objective:  Physical Exam: BP 110/60   Pulse 70   Temp 98.2 F (36.8 C) (Oral)   Ht 6' (1.829 m)   Wt (!) 369 lb (167.4 kg)   SpO2 (!) 86%   BMI 50.05 kg/m   Gen: NAD, on chronic O2nc, sitting comfortably, obese CV: irregular rate but not tachy,  with 3/6 murmur appreciated Pulm: NWOB, chronically course lung sounds, no cough GI: Soft, Nontender, Nondistended. MSK: no edema, cyanosis, or clubbing noted.  Full sensation and cap refill normal in both hands with claim of "tingling" distal to wrist bilaterally to include fingers 4-5 bilaterally. Neuro: grossly normal, moves all extremities Psych: Normal affect and thought content  No results found for this or any previous visit (from the past 72 hour(s)).   Assessment/Plan:  Anticoagulation management encounter INR was elevated >4, held one dose (on 12 coumadin chronically), then will decrease to 10 warfarin with f/u INR in lab next week with Roberto [name]  Dyspnea Referal to ENT, patient concerned he isn't breathing well through nose even when having  no rhinorhea  Morbid obesity ([place]) Referal to PT, concern for walker with obesity impacting ulnar nerve distribution symptoms in hands  Chronic respiratory failure with hypoxia ([place]) Referal to ENT for concern about narrow/irritated nasal passages and PT for furter assistance with mobitlity/strength  Lesions of both ulnar nerves Likely compression due to use of walker given ulnar distribution distal to hands, referral to PT renewed and he will work with them on grips/ergonomics most likely to not aggravate in future.   Roberto Sires, DO FAMILY MEDICINE RESIDENT - PGY2 04/11/2018 9:45 AM

## 2018-04-11 DIAGNOSIS — G5623 Lesion of ulnar nerve, bilateral upper limbs: Secondary | ICD-10-CM | POA: Insufficient documentation

## 2018-04-11 NOTE — Assessment & Plan Note (Signed)
Referal to ENT, patient concerned he isn't breathing well through nose even when having no rhinorhea

## 2018-04-11 NOTE — Assessment & Plan Note (Signed)
Referal to ENT for concern about narrow/irritated nasal passages and PT for furter assistance with mobitlity/strength

## 2018-04-11 NOTE — Assessment & Plan Note (Signed)
Likely compression due to use of walker given ulnar distribution distal to hands, referral to PT renewed and he will work with them on grips/ergonomics most likely to not aggravate in future.

## 2018-04-11 NOTE — Assessment & Plan Note (Signed)
INR was elevated >4, held one dose (on 12 coumadin chronically), then will decrease to 10 warfarin with f/u INR in lab next week with [name]

## 2018-04-11 NOTE — Assessment & Plan Note (Signed)
Referal to PT, concern for walker with obesity impacting ulnar nerve distribution symptoms in hands

## 2018-04-16 ENCOUNTER — Ambulatory Visit: Payer: Medicare HMO

## 2018-04-17 ENCOUNTER — Encounter: Payer: Self-pay | Admitting: Family Medicine

## 2018-04-18 ENCOUNTER — Telehealth: Payer: Self-pay | Admitting: Family Medicine

## 2018-04-18 NOTE — Telephone Encounter (Signed)
Called patient because Lab manager, Roberto [name] mentioned he did not show up for a scheduled INR check after reducing warfarin 2/2 a high INR.  Patient said his knee had been bothering him after a long walk and he didn't feel like comign in that day.  He has been doing well, taking to 52m instead of 12 as prescribed and will come to [facility] [place]this week for an INR  -Dr. BCriss Rosales

## 2018-04-30 ENCOUNTER — Other Ambulatory Visit: Payer: Self-pay

## 2018-04-30 ENCOUNTER — Ambulatory Visit (INDEPENDENT_AMBULATORY_CARE_PROVIDER_SITE_OTHER): Payer: Medicare HMO | Admitting: *Deleted

## 2018-04-30 ENCOUNTER — Encounter (HOSPITAL_COMMUNITY): Payer: Self-pay

## 2018-04-30 ENCOUNTER — Ambulatory Visit (HOSPITAL_COMMUNITY): Payer: Medicare HMO | Attending: Family Medicine

## 2018-04-30 DIAGNOSIS — M6281 Muscle weakness (generalized): Secondary | ICD-10-CM | POA: Insufficient documentation

## 2018-04-30 DIAGNOSIS — I82412 Acute embolism and thrombosis of left femoral vein: Secondary | ICD-10-CM

## 2018-04-30 DIAGNOSIS — R29898 Other symptoms and signs involving the musculoskeletal system: Secondary | ICD-10-CM | POA: Diagnosis not present

## 2018-04-30 DIAGNOSIS — Z7901 Long term (current) use of anticoagulants: Secondary | ICD-10-CM | POA: Diagnosis not present

## 2018-04-30 DIAGNOSIS — M25512 Pain in left shoulder: Secondary | ICD-10-CM | POA: Diagnosis not present

## 2018-04-30 DIAGNOSIS — Z5181 Encounter for therapeutic drug level monitoring: Secondary | ICD-10-CM

## 2018-04-30 DIAGNOSIS — R2689 Other abnormalities of gait and mobility: Secondary | ICD-10-CM | POA: Diagnosis not present

## 2018-04-30 DIAGNOSIS — I824Y2 Acute embolism and thrombosis of unspecified deep veins of left proximal lower extremity: Secondary | ICD-10-CM | POA: Diagnosis not present

## 2018-04-30 DIAGNOSIS — R0600 Dyspnea, unspecified: Secondary | ICD-10-CM | POA: Diagnosis not present

## 2018-04-30 DIAGNOSIS — M25612 Stiffness of left shoulder, not elsewhere classified: Secondary | ICD-10-CM | POA: Insufficient documentation

## 2018-04-30 DIAGNOSIS — I509 Heart failure, unspecified: Secondary | ICD-10-CM | POA: Diagnosis not present

## 2018-04-30 DIAGNOSIS — G8929 Other chronic pain: Secondary | ICD-10-CM | POA: Insufficient documentation

## 2018-04-30 LAB — POCT INR: INR: 2.5 (ref 2.0–3.0)

## 2018-04-30 NOTE — Therapy (Signed)
[place], [place], [address] Phone: [phone]   Fax:  [phone]  Physical Therapy Evaluation  Patient Details  Name: Roberto [name] MRN: [id number] Date of Birth: Jan 25, 1961 Referring Provider: Sherene Sires, DO   Encounter Date: 04/30/2018  PT End of Session - 04/30/18 1407    Visit Number  1    Number of Visits  13    Date for PT Re-Evaluation  06/11/18    Authorization Type  [employer] Medicare HMO (no auth required, no visit limit)    Authorization Time Period  04/30/18 - 06/14/18    Authorization - Visit Number  1    Authorization - Number of Visits  10    PT Start Time  1301    PT Stop Time  1350    PT Time Calculation (min)  49 min    Activity Tolerance  Patient tolerated treatment well    Behavior During Therapy  [facility] for tasks assessed/performed       Past Medical History:  Diagnosis Date  . Asthma   . CHF (congestive heart failure) ([place])   . COPD (chronic obstructive pulmonary disease) ([place])   . Diabetes mellitus without complication ([place])   . Hypertension     Past Surgical History:  Procedure Laterality Date  . SHOULDER SURGERY      There were no vitals filed for this visit.   Subjective Assessment - 04/30/18 1306    Subjective  Patient arrives for physical therapy evaluation for deconditioning and endurance training due to cardiopulmonary restrictions. Patient reports he is also interested in therapy for his Lt shoulder as he has a history of frozen shoulder and has been having pain similar to when it started several years ago.  He reports he would like to be able to walk more and lose weight to improve his health and manage his multiple medical conditions better. Patient reports he weighs himself daily to monitor his fluid retention and verbalized understanding of signs of acute CHF.    Pertinent History  Significant history for A-fib, CHF (cor pulmonale), COPD, HTN, history of DVT's, Lt frozen  shoulder    Limitations  Walking;House hold activities    How long can you sit comfortably?  unlimited    How long can you stand comfortably?  not sure    How long can you walk comfortably?  not sure (not an hour)    Patient Stated Goals  improve endurance and lose weight    Currently in Pain?  No/denies shuolder hurts some         [facility] PT Assessment - 04/30/18 0001      Assessment   Medical Diagnosis  Deconditioning, Chronic Respiratory Disorders    Referring Provider  Sherene Sires, DO    Onset Date/Surgical Date  -- several years    Next MD Visit  ENT appointment tomorrow; unsure with Dr. Criss Rosales    Prior Therapy  for Lt shoulder 10-15 years ago and for Lt knee ~ 1 year ago      Precautions   Precautions  Other (comment)    Precaution Comments  Supplemental O2 for activity: patient requires 5L/min via nasal canula      Restrictions   Weight Bearing Restrictions  No      Balance Screen   Has the patient fallen in the past 6 months  Yes    How many times?  1    Has the patient had a decrease in  activity level because of a fear of falling?   No    Is the patient reluctant to leave their home because of a fear of falling?   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, truck driver in past, driving currently      Cognition   Overall Cognitive Status  Within Functional Limits for tasks assessed      Observation/Other Assessments   Observations  edema present in bil LE's     Skin Integrity  hemosiderin staining alng dital lower legs on bil LE       ROM / Strength   AROM / PROM / Strength  Strength      Strength   Strength Assessment Site  Hip;Knee;Ankle    Right Hip Flexion  4/5    Right Hip Extension  4/5    Right Hip ABduction  4/5    Left Hip Flexion  4/5    Left Hip Extension  4/5    Left Hip ABduction  4/5    Right/Left Knee  Right;Left    Right Knee Flexion  4/5    Right Knee Extension  4+/5    Left Knee Flexion  4/5    Left Knee Extension  4+/5    Right Ankle Dorsiflexion  4+/5    Left Ankle Dorsiflexion  4+/5      Transfers   Five time sit to stand comments   18.2 seconds; patient requires UE assist from       Ambulation/Gait   Ambulation/Gait  Yes    Ambulation/Gait Assistance  6: Modified independent (Device/Increase time)    Ambulation Distance (Feet)  60 Feet    Assistive device  Straight cane    Gait Pattern  Step-through pattern;Decreased stride length;Abducted - left;Abducted- right;Wide base of support    Ambulation Surface  Level;Indoor    Gait Comments  patient with slow gait and overall steady, he ambulates with SPC typically but is able to maintain balance with no device on level surfaces      6 Minute Walk- Baseline   6 Minute Walk- Baseline  yes    BP (mmHg)  100/60    HR (bpm)  48  (Abnormal)  patient's HR fluctuating throughout session between 40's-80'    02 Sat (%RA)  85 %    Modified Borg Scale for Dyspnea  0- Nothing at all    Perceived Rate of Exertion (Borg)  6-      6 minute walk test results    Endurance additional comments  6MWT held today as patient's O2 cannister was almost empty with needle resting in "refill" section         Objective measurements completed on examination: See above findings.    PT Education - 04/30/18 1608    Education Details  Educated patient on benefits of physical therapy for endurance and strength training as well as to progress independence with mobility and progress towards weigth loss  fitness routine. Edcuated on benefits fo aquatic therapy for  cardiopulmonary endurance, LE strengthenign, and to improve LE swelling. Educated that we will send a referral to Dr. Criss Rosales for OT to evaluate and treat his Lt shoulder as he would like to be seen for this as well. Discussed what to expect at [facility] therapy sessions if they become available. Educated on pursed lip breathing to improve SpO2 and improve oxygen perfussion to his brain and muscles as well as all vital organs and to wear his O2 as instructed by his MD and to bring a full cannister to next therapy session.    Person(s) Educated  Patient    Methods  Explanation;Handout    Comprehension  Verbalized understanding       PT Short Term Goals - 04/30/18 1417      PT SHORT TERM GOAL #1   Title  Patient will demonstrate more consitant use of supplemental oxygen and use of pursed lip breathing to maintain improved SpO2 levels of 85% or higher throughout entire session.    Time  3    Period  Weeks      PT SHORT TERM GOAL #2   Title  Patient will improve MMT by 1/2 grade for limited groups to demonstrate improved LE strength to improve ease of gait and stair mobility.    Time  3    Period  Weeks    Status  New    Target Date  05/21/18      PT SHORT TERM GOAL #3   Title  Patient will perform 5x sit to stand from standard height without use of UE's in equal to evaluation time or better to indicate decresased reliance on UE and improved LE strength.     Time  3    Period  Weeks    Status  New      PT SHORT TERM GOAL #4   Title  Patient will obtain new compression garments to wear during the day and demonstrate safe/correct donning/doffing of garements to manage LE edema independently.    Time  3    Period  Weeks    Status  New        PT Long Term Goals - 04/30/18 1423      PT LONG TERM GOAL #1   Title  Patient will register for and particiapte/attend the free Diabetes Education classes that are held at [facility] by the [employer] to improve  knowledge on healhty eating adn lifestyle changes to improve management of weight loss and diabetes/multiple medical conditions management.    Time  4    Period  Weeks    Status  New    Target Date  05/28/18      PT LONG TERM GOAL #2   Title  Patient will improve MMT by 1 grade for limited groups to demonstrate improved LE strength to improve ease of gaita nd stair mobility    Time  6    Period  Weeks    Status  New    Target Date  06/11/18      PT LONG TERM GOAL #3   Title  Patient will improve 5x sit to stand test by 5 or more seconds to demonstrate significant improvement in LE strength and improved activity tolerance.     Time  6    Period  Weeks    Status  New      PT LONG TERM GOAL #4  Title  Patient will improve 6MWT distance by 177 feet to demosntrate significant change in cardiopulmonary endurance with functional gait.     Time  6    Period  Weeks    Status  New      PT LONG TERM GOAL #5   Title  Patient will reports consistant participation with exercise log for 150 min/week for 2 week period to improve overall healthy lifestyle and demonstrate increased self motivation to participate in exercise to improve health and work towards weight loss.     Time  6    Period  Weeks    Status  New        Plan - 04/30/18 1409    Clinical Impression Statement  Mr. Crisostomo presents to physical therapy for initial evaluation for poor endurance and deconditioning related to multiple medical conditions and significant cardiopulmonary diagnoses. He presents with impaired gait, decreased balance, impaired sensation, bil LE edema, decreased cardiopulmonary endurance, and muscle weakness. He has been educated on benefits of physical therapy to address current impairments with mobility and improve overall health/wellness. Unable to perform 6MWT for baseline measure today as patient O2 canister was almost empty in the "refill" section. He will benefit from skilled PT interventions and education  to improve mobility and QOL.    History and Personal Factors relevant to plan of care:  Significant history for A-fib, CHF (cor pulmonale), COPD, HTN, history of DVT's, Lt frozen shoulder     Clinical Presentation  Stable    Clinical Presentation due to:  MMT, 5x sit to stand, gait, clinical judgement    Clinical Decision Making  Low    Rehab Potential  Fair    PT Frequency  2x / week    PT Duration  6 weeks    PT Treatment/Interventions  ADLs/Self Care Home Management;Aquatic Therapy;Cryotherapy;Electrical Stimulation;Moist Heat;Stair training;Functional mobility training;Therapeutic activities;Balance training;Therapeutic exercise;Patient/family education;Manual techniques;Energy conservation    PT Next Visit Plan  Perform 6 MWT at next land based visist and review evaluation and goals. F/U with referral for OT for shoulder pain. Continue education on importance of using O2 as instructed and educate on comrpession garment use and measure for garments. Review pursed lip breathing and add HEP for strengthening.    PT Home Exercise Plan  Eval: pursed lip breathing.    Consulted and Agree with Plan of Care  Patient       Patient will benefit from skilled therapeutic intervention in order to improve the following deficits and impairments:  Abnormal gait, Decreased balance, Decreased endurance, Decreased mobility, Difficulty walking, Obesity, Increased edema, Cardiopulmonary status limiting activity, Decreased activity tolerance, Decreased strength  Visit Diagnosis: Other abnormalities of gait and mobility  Muscle weakness (generalized)     Problem List Patient Active Problem List   Diagnosis Date Noted  . Lesions of both ulnar nerves 04/11/2018  . Solitary pulmonary nodule 06/20/2017  . Anticoagulation management encounter 06/05/2017  . GERD without esophagitis 05/10/2017  . Acute deep vein thrombosis (DVT) of femoral vein of left lower extremity ([place])   . Essential hypertension   .  Morbid obesity ([place])   . Dyspnea   . Bradycardia   . Saphenous vein clot, left 05/04/2017  . Cor pulmonale, chronic ([place]) 05/04/2017  . Type 2 diabetes mellitus with diabetic polyneuropathy, without long-term current use of insulin ([place])   . Chronic respiratory failure with hypoxia ([place])   . Acute deep vein thrombosis (DVT) of proximal vein of left lower extremity ([place])   .  Combined congestive systolic and diastolic heart failure ([place])   . Atrial fibrillation with slow ventricular response ([place])   . Community acquired pneumonia     Kipp Brood, PT, DPT Physical Therapist with [facility]  04/30/2018 4:26 PM    [place], [place], [address] Phone: [phone]   Fax:  [phone]  Name: Roberto [name] MRN: [id number] Date of Birth: [birth year]

## 2018-05-01 DIAGNOSIS — J9611 Chronic respiratory failure with hypoxia: Secondary | ICD-10-CM | POA: Diagnosis not present

## 2018-05-01 DIAGNOSIS — R0600 Dyspnea, unspecified: Secondary | ICD-10-CM | POA: Diagnosis not present

## 2018-05-01 DIAGNOSIS — H524 Presbyopia: Secondary | ICD-10-CM | POA: Diagnosis not present

## 2018-05-02 ENCOUNTER — Telehealth (HOSPITAL_COMMUNITY): Payer: Self-pay | Admitting: Family Medicine

## 2018-05-02 ENCOUNTER — Ambulatory Visit (HOSPITAL_COMMUNITY): Payer: Medicare HMO

## 2018-05-02 NOTE — Telephone Encounter (Signed)
05/02/18  pt called to cx said he has just been running to much this week and can't get his strength up to come for therapy

## 2018-05-04 DIAGNOSIS — R0902 Hypoxemia: Secondary | ICD-10-CM | POA: Diagnosis not present

## 2018-05-04 DIAGNOSIS — J9621 Acute and chronic respiratory failure with hypoxia: Secondary | ICD-10-CM | POA: Diagnosis not present

## 2018-05-04 DIAGNOSIS — G4733 Obstructive sleep apnea (adult) (pediatric): Secondary | ICD-10-CM | POA: Diagnosis not present

## 2018-05-04 DIAGNOSIS — I509 Heart failure, unspecified: Secondary | ICD-10-CM | POA: Diagnosis not present

## 2018-05-06 ENCOUNTER — Telehealth: Payer: Self-pay | Admitting: Family Medicine

## 2018-05-06 ENCOUNTER — Encounter (INDEPENDENT_AMBULATORY_CARE_PROVIDER_SITE_OTHER): Payer: Medicare HMO | Admitting: Ophthalmology

## 2018-05-06 DIAGNOSIS — H2513 Age-related nuclear cataract, bilateral: Secondary | ICD-10-CM | POA: Diagnosis not present

## 2018-05-06 DIAGNOSIS — H33302 Unspecified retinal break, left eye: Secondary | ICD-10-CM | POA: Diagnosis not present

## 2018-05-06 DIAGNOSIS — H43813 Vitreous degeneration, bilateral: Secondary | ICD-10-CM

## 2018-05-06 DIAGNOSIS — I10 Essential (primary) hypertension: Secondary | ICD-10-CM | POA: Diagnosis not present

## 2018-05-06 DIAGNOSIS — E11311 Type 2 diabetes mellitus with unspecified diabetic retinopathy with macular edema: Secondary | ICD-10-CM | POA: Diagnosis not present

## 2018-05-06 DIAGNOSIS — H35033 Hypertensive retinopathy, bilateral: Secondary | ICD-10-CM

## 2018-05-06 NOTE — Telephone Encounter (Signed)
Authorization for compression garment per therapy signed and placed in "to be faxed" file.  -Dr. Criss Rosales

## 2018-05-07 ENCOUNTER — Ambulatory Visit (HOSPITAL_COMMUNITY): Payer: Medicare HMO

## 2018-05-07 ENCOUNTER — Encounter (HOSPITAL_COMMUNITY): Payer: Self-pay

## 2018-05-07 VITALS — BP 117/64 | HR 91

## 2018-05-07 DIAGNOSIS — R2689 Other abnormalities of gait and mobility: Secondary | ICD-10-CM

## 2018-05-07 DIAGNOSIS — M6281 Muscle weakness (generalized): Secondary | ICD-10-CM | POA: Diagnosis not present

## 2018-05-07 DIAGNOSIS — R29898 Other symptoms and signs involving the musculoskeletal system: Secondary | ICD-10-CM | POA: Diagnosis not present

## 2018-05-07 DIAGNOSIS — M25612 Stiffness of left shoulder, not elsewhere classified: Secondary | ICD-10-CM | POA: Diagnosis not present

## 2018-05-07 DIAGNOSIS — M25512 Pain in left shoulder: Secondary | ICD-10-CM | POA: Diagnosis not present

## 2018-05-07 DIAGNOSIS — G8929 Other chronic pain: Secondary | ICD-10-CM | POA: Diagnosis not present

## 2018-05-07 NOTE — Therapy (Signed)
[place], [place], [address] Phone: [phone]   Fax:  [phone]  Physical Therapy Treatment  Patient Details  Name: Roberto [name] MRN: [id number] Date of Birth: Jul 18, 1961 Referring Provider: Sherene Sires, DO   Encounter Date: 05/07/2018  PT End of Session - 05/07/18 0840    Visit Number  2    Number of Visits  13    Date for PT Re-Evaluation  06/11/18    Authorization Type  Human Medicare HMO (no auth required, no visit limit)    Authorization Time Period  04/30/18 - 06/14/18    Authorization - Visit Number  2    Authorization - Number of Visits  10    PT Start Time  [phone]   pt late for apt   PT Stop Time  0908    PT Time Calculation (min)  33 min    Activity Tolerance  Patient tolerated treatment well;Other (comment)   Decreased O2 saturation through session   Behavior During Therapy  [facility] for tasks assessed/performed       Past Medical History:  Diagnosis Date  . Asthma   . CHF (congestive heart failure) ([place])   . COPD (chronic obstructive pulmonary disease) ([place])   . Diabetes mellitus without complication ([place])   . Hypertension     Past Surgical History:  Procedure Laterality Date  . SHOULDER SURGERY      Vitals:   05/07/18 0840 05/07/18 0847 05/07/18 0909  BP: 117/64    Pulse: (!) 53 66 91  SpO2: (!) 78% 90% (!) 82%    Subjective Assessment - 05/07/18 0838    Subjective  Pt stated he has some soreness in forearm/wrist, believes it may be related to increased activities.  Has been using rollator and lots of MD apts.  Pt stated he has filled the tank for walking, currently on L4 of     Pertinent History  Significant history for A-fib, CHF (cor pulmonale), COPD, HTN, history of DVT's, Lt frozen shoulder    Patient Stated Goals  improve endurance and lose weight    Currently in Pain?  No/denies                       [facility] Adult PT Treatment/Exercise - 05/07/18 0001       Ambulation/Gait   Ambulation/Gait  Yes    Ambulation/Gait Assistance  6: Modified independent (Device/Increase time)    Ambulation Distance (Feet)  226 Feet   6MWT decreased O2 sat%, stopped test following 1 min   Assistive device  Straight cane    Gait Pattern  Step-through pattern;Decreased stride length;Abducted - left;Abducted- right;Wide base of support    Ambulation Surface  Level;Indoor    Gait Comments  pt with decreased O2 saturation following 1 minute of testing, stopped test early      Exercises   Exercises  Knee/Hip      Knee/Hip Exercises: Seated   Other Seated Knee/Hip Exercises  Pursed lip breathing instructed through session               PT Short Term Goals - 04/30/18 1417      PT SHORT TERM GOAL #1   Title  Patient will demonstrate more consitant use of supplemental oxygen and use of pursed lip breathing to maintain improved SpO2 levels of 85% or higher throughout entire session.    Time  3    Period  Weeks  PT SHORT TERM GOAL #2   Title  Patient will improve MMT by 1/2 grade for limited groups to demonstrate improved LE strength to improve ease of gait and stair mobility.    Time  3    Period  Weeks    Status  New    Target Date  05/21/18      PT SHORT TERM GOAL #3   Title  Patient will perform 5x sit to stand from standard height without use of UE's in equal to evaluation time or better to indicate decresased reliance on UE and improved LE strength.     Time  3    Period  Weeks    Status  New      PT SHORT TERM GOAL #4   Title  Patient will obtain new compression garments to wear during the day and demonstrate safe/correct donning/doffing of garements to manage LE edema independently.    Time  3    Period  Weeks    Status  New        PT Long Term Goals - 04/30/18 1423      PT LONG TERM GOAL #1   Title  Patient will register for and particiapte/attend the free Diabetes Education classes that are held at [facility] by the  [employer] to improve knowledge on healhty eating adn lifestyle changes to improve management of weight loss and diabetes/multiple medical conditions management.    Time  4    Period  Weeks    Status  New    Target Date  05/28/18      PT LONG TERM GOAL #2   Title  Patient will improve MMT by 1 grade for limited groups to demonstrate improved LE strength to improve ease of gaita nd stair mobility    Time  6    Period  Weeks    Status  New    Target Date  06/11/18      PT LONG TERM GOAL #3   Title  Patient will improve 5x sit to stand test by 5 or more seconds to demonstrate significant improvement in LE strength and improved activity tolerance.     Time  6    Period  Weeks    Status  New      PT LONG TERM GOAL #4   Title  Patient will improve 6MWT distance by 177 feet to demosntrate significant change in cardiopulmonary endurance with functional gait.     Time  6    Period  Weeks    Status  New      PT LONG TERM GOAL #5   Title  Patient will reports consistant participation with exercise log for 150 min/week for 2 week period to improve overall healthy lifestyle and demonstrate increased self motivation to participate in exercise to improve health and work towards weight loss.     Time  6    Period  Weeks    Status  New            Plan - 05/07/18 1019    Clinical Impression Statement  Pt late for apt, was able to review goals and copy of eval given to pt.  Vitals were assessed through session.  Began 6MWT though pt unable to keep O2 saturation within appropriate measure and had to stop testing following 1 min as it reduced to 74% with 5L O2 assistance from canister.  Pursed lip breath instructed through session with ability to  return to WNL range following 1-2'.  Did received signed referrals for OT to begin tx on shoulder as well as compression garmets.  Measurements taken of BLE to address edema presented with signed referral and list of placed  to purchase from.  No reports of pain through session.      Rehab Potential  Fair    PT Frequency  2x / week    PT Duration  6 weeks    PT Treatment/Interventions  ADLs/Self Care Home Management;Aquatic Therapy;Cryotherapy;Electrical Stimulation;Moist Heat;Stair training;Functional mobility training;Therapeutic activities;Balance training;Therapeutic exercise;Patient/family education;Manual techniques;Energy conservation    PT Next Visit Plan  F/U on compression garmet purchase and educate on appropraite use. Continue education on importance of using O2 as instructed.  Review pursed lip breathing and add HEP for strengthening.    PT Home Exercise Plan  Eval: pursed lip breathing.       Patient will benefit from skilled therapeutic intervention in order to improve the following deficits and impairments:  Abnormal gait, Decreased balance, Decreased endurance, Decreased mobility, Difficulty walking, Obesity, Increased edema, Cardiopulmonary status limiting activity, Decreased activity tolerance, Decreased strength  Visit Diagnosis: Other abnormalities of gait and mobility  Muscle weakness (generalized)     Problem List Patient Active Problem List   Diagnosis Date Noted  . Lesions of both ulnar nerves 04/11/2018  . Solitary pulmonary nodule 06/20/2017  . Anticoagulation management encounter 06/05/2017  . GERD without esophagitis 05/10/2017  . Acute deep vein thrombosis (DVT) of femoral vein of left lower extremity ([place])   . Essential hypertension   . Morbid obesity ([place])   . Dyspnea   . Bradycardia   . Saphenous vein clot, left 05/04/2017  . Cor pulmonale, chronic ([place]) 05/04/2017  . Type 2 diabetes mellitus with diabetic polyneuropathy, without long-term current use of insulin ([place])   . Chronic respiratory failure with hypoxia ([place])   . Acute deep vein thrombosis (DVT) of proximal vein of left lower extremity ([place])   . Combined congestive systolic and diastolic heart failure ([place])    . Atrial fibrillation with slow ventricular response ([place])   . Community acquired pneumonia    [place], [place]; [place]  Aldona Lento 05/07/2018, 10:55 AM  [place], [place], [address] Phone: [phone]   Fax:  [phone]  Name: Roberto [name] MRN: [id number] Date of Birth: 05-25-1961

## 2018-05-09 ENCOUNTER — Other Ambulatory Visit: Payer: Self-pay

## 2018-05-09 ENCOUNTER — Ambulatory Visit (HOSPITAL_COMMUNITY): Payer: Medicare HMO

## 2018-05-09 ENCOUNTER — Encounter (HOSPITAL_COMMUNITY): Payer: Self-pay

## 2018-05-09 DIAGNOSIS — R2689 Other abnormalities of gait and mobility: Secondary | ICD-10-CM

## 2018-05-09 DIAGNOSIS — M25512 Pain in left shoulder: Secondary | ICD-10-CM | POA: Diagnosis not present

## 2018-05-09 DIAGNOSIS — R29898 Other symptoms and signs involving the musculoskeletal system: Secondary | ICD-10-CM | POA: Diagnosis not present

## 2018-05-09 DIAGNOSIS — M6281 Muscle weakness (generalized): Secondary | ICD-10-CM | POA: Diagnosis not present

## 2018-05-09 DIAGNOSIS — G8929 Other chronic pain: Secondary | ICD-10-CM | POA: Diagnosis not present

## 2018-05-09 DIAGNOSIS — M25612 Stiffness of left shoulder, not elsewhere classified: Secondary | ICD-10-CM | POA: Diagnosis not present

## 2018-05-09 NOTE — Therapy (Signed)
[place], [place], [address] Phone: [phone]   Fax:  [phone]  Physical Therapy Treatment  Patient Details  Name: Roberto [name] MRN: [id number] Date of Birth: 1961/01/20 Referring Provider: Sherene Sires, DO   Encounter Date: 05/09/2018  PT End of Session - 05/09/18 1002    Visit Number  3    Number of Visits  13    Date for PT Re-Evaluation  06/11/18    Authorization Type  [employer] H[employer] (no auth required, no visit limit)    Authorization Time Period  04/30/18 - 06/14/18    Authorization - Visit Number  3    Authorization - Number of Visits  10    PT Start Time  0950    PT Stop Time  1034    PT Time Calculation (min)  44 min    Activity Tolerance  Patient tolerated treatment well;Other (comment)   Decreased O2 saturation through session   Behavior During Therapy  [facility] for tasks assessed/performed       Past Medical History:  Diagnosis Date  . Asthma   . CHF (congestive heart failure) ([place])   . COPD (chronic obstructive pulmonary disease) ([place])   . Diabetes mellitus without complication ([place])   . Hypertension     Past Surgical History:  Procedure Laterality Date  . SHOULDER SURGERY      There were no vitals filed for this visit.  Subjective Assessment - 05/09/18 1027    Subjective  Patient reoprts he is well today and that he has a full tank of oxygen. He arrives with ace wrap around Lt wrist and reports he has new swelling and pain in his Lt wrist that started last night and was worse when he woke up this morning.     Pertinent History  Significant history for A-fib, CHF (cor pulmonale), COPD, HTN, history of DVT's, Lt frozen shoulder    Limitations  Walking;House hold activities    How long can you sit comfortably?  unlimited    How long can you stand comfortably?  not sure    How long can you walk comfortably?  not sure (not an hour)    Patient Stated Goals  improve endurance and lose  weight    Currently in Pain?  Yes    Pain Score  6     Pain Location  Wrist    Pain Orientation  Left;Posterior    Pain Descriptors / Indicators  Throbbing    Pain Type  Acute pain    Pain Onset  Yesterday    Pain Frequency  Constant    Aggravating Factors   movingwrist    Pain Relieving Factors  ice, ace wrap       [facility] Adult PT Treatment/Exercise - 05/09/18 0001      Exercises   Exercises  Knee/Hip      Knee/Hip Exercises: Aerobic   Nustep  Interval training: 3x 3 minutes at moderate intensity, 1 minute easy rest      Knee/Hip Exercises: Standing   Heel Raises  Both;1 set;10 reps;Limitations    Heel Raises Limitations  at // bars      Knee/Hip Exercises: Seated   Long Arc Quad  Strengthening;Both;10 reps;1 set    Other Seated Knee/Hip Exercises  Pursed lip breathing instructed through session    Sit to Sand  2 sets;10 reps         05/09/18 1050  PT Education  Education Details  Educated on pursed liip breathing and on rest breaks to maintain SpO2 levels. Educated on exercise throughout and intiial HEP for LE strengthening. Encouraged to go to MD to assess LT wrist swelling.  Person(s) Educated Patient  Methods Explanation;Handout;Demonstration;Verbal cues  Comprehension Verbalized understanding;Returned demonstration     PT Short Term Goals - 04/30/18 1417      PT SHORT TERM GOAL #1   Title  Patient will demonstrate more consitant use of supplemental oxygen and use of pursed lip breathing to maintain improved SpO2 levels of 85% or higher throughout entire session.    Time  3    Period  Weeks      PT SHORT TERM GOAL #2   Title  Patient will improve MMT by 1/2 grade for limited groups to demonstrate improved LE strength to improve ease of gait and stair mobility.    Time  3    Period  Weeks    Status  New    Target Date  05/21/18      PT SHORT TERM GOAL #3   Title  Patient will perform 5x sit to stand from standard height without use of UE's in equal to  evaluation time or better to indicate decresased reliance on UE and improved LE strength.     Time  3    Period  Weeks    Status  New      PT SHORT TERM GOAL #4   Title  Patient will obtain new compression garments to wear during the day and demonstrate safe/correct donning/doffing of garements to manage LE edema independently.    Time  3    Period  Weeks    Status  New        PT Long Term Goals - 04/30/18 1423      PT LONG TERM GOAL #1   Title  Patient will register for and particiapte/attend the free Diabetes Education classes that are held at [facility] by the [employer] to improve knowledge on healhty eating adn lifestyle changes to improve management of weight loss and diabetes/multiple medical conditions management.    Time  4    Period  Weeks    Status  New    Target Date  05/28/18      PT LONG TERM GOAL #2   Title  Patient will improve MMT by 1 grade for limited groups to demonstrate improved LE strength to improve ease of gaita nd stair mobility    Time  6    Period  Weeks    Status  New    Target Date  06/11/18      PT LONG TERM GOAL #3   Title  Patient will improve 5x sit to stand test by 5 or more seconds to demonstrate significant improvement in LE strength and improved activity tolerance.     Time  6    Period  Weeks    Status  New      PT LONG TERM GOAL #4   Title  Patient will improve 6MWT distance by 177 feet to demosntrate significant change in cardiopulmonary endurance with functional gait.     Time  6    Period  Weeks    Status  New      PT LONG TERM GOAL #5   Title  Patient will reports consistant participation with exercise log for 150 min/week for 2 week period to improve overall healthy lifestyle and demonstrate increased self motivation to participate  in exercise to improve health and work towards weight loss.     Time  6    Period  Weeks    Status  New        Plan - 05/09/18 1007    Clinical  Impression Statement  Initiated LE functional strengthening this session and cardiovascular endurance training with intervals on Nustep today. Patient arrived with O2 on 5L/min and resting SpO2 was 80%. He was educated again on pursed lip breathing and his resting sats came up to 91% prior to starting NuStep intervals. He performed 3x 21mnutes at easy/moderate intensity and maintained SpO2 of 86% or higher. He performed sit to stands without UE assistance from elevated surface and was instructed on LAQ and heel raises for to initiate HEP. He had new onset of edema in his Lt wrist, which was assessed at the start of the session. He had pain with palpation around the Lt capitate and lunate, limited wrist flex/ext, and edema with a lump along the posterior distal ulna. I spoke with his referring physician's office, Dr. SSherene Siresand the patient has an appointment for tomorrow at 10:10 AM to have his wrist assessed. He will continue to benefit from skilled PT interventions to address current limitations and progress functional mobility and endurance.    Rehab Potential  Fair    PT Frequency  2x / week    PT Duration  6 weeks    PT Treatment/Interventions  ADLs/Self Care Home Management;Aquatic Therapy;Cryotherapy;Electrical Stimulation;Moist Heat;Stair training;Functional mobility training;Therapeutic activities;Balance training;Therapeutic exercise;Patient/family education;Manual techniques;Energy conservation    PT Next Visit Plan  F/U on compression garment purchase and educate on appropriate use. Continue education on importance of using O2 as instructed.  Review pursed lip breathing and progress HEP as able. Continue with interval training on Nustep next session and use Borg RPE scale to target moderate intensity. Monitor SpO2 throughout.     PT Home Exercise Plan  Eval: pursed lip breathing. 05/09/18 - sit to stand, LAQ, heel raises    Consulted and Agree with Plan of Care  Patient       Patient will  benefit from skilled therapeutic intervention in order to improve the following deficits and impairments:  Abnormal gait, Decreased balance, Decreased endurance, Decreased mobility, Difficulty walking, Obesity, Increased edema, Cardiopulmonary status limiting activity, Decreased activity tolerance, Decreased strength  Visit Diagnosis: Other abnormalities of gait and mobility  Muscle weakness (generalized)     Problem List Patient Active Problem List   Diagnosis Date Noted  . Lesions of both ulnar nerves 04/11/2018  . Solitary pulmonary nodule 06/20/2017  . Anticoagulation management encounter 06/05/2017  . GERD without esophagitis 05/10/2017  . Acute deep vein thrombosis (DVT) of femoral vein of left lower extremity ([place]   . Essential hypertension   . Morbid obesity ([place]   . Dyspnea   . Bradycardia   . Saphenous vein clot, left 05/04/2017  . Cor pulmonale, chronic ([place] 05/04/2017  . Type 2 diabetes mellitus with diabetic polyneuropathy, without long-term current use of insulin ([place]   . Chronic respiratory failure with hypoxia ([place]   . Acute deep vein thrombosis (DVT) of proximal vein of left lower extremity ([place]   . Combined congestive systolic and diastolic heart failure ([place]   . Atrial fibrillation with slow ventricular response ([place]   . Community acquired pneumonia     RKipp Brood PT, DPT Physical Therapist with [facility] 05/09/2018 10:51 AM    [place] [place]  [facility] [address] [place], [place], [address] Phone: [phone]   Fax:  [phone]  Name: Zadok Holaway MRN: [id number] Date of Birth: [birth year]

## 2018-05-10 ENCOUNTER — Ambulatory Visit
Admission: RE | Admit: 2018-05-10 | Discharge: 2018-05-10 | Disposition: A | Discharge status: Home / Self Care | Payer: Medicare HMO | Source: Ambulatory Visit | Attending: Family Medicine | Admitting: Family Medicine

## 2018-05-10 ENCOUNTER — Encounter: Payer: Self-pay | Admitting: Family Medicine

## 2018-05-10 ENCOUNTER — Ambulatory Visit (INDEPENDENT_AMBULATORY_CARE_PROVIDER_SITE_OTHER): Payer: Medicare HMO | Admitting: Family Medicine

## 2018-05-10 ENCOUNTER — Other Ambulatory Visit: Payer: Self-pay

## 2018-05-10 VITALS — BP 110/60 | HR 77 | Temp 99.7°F | Ht 72.0 in | Wt 372.0 lb

## 2018-05-10 DIAGNOSIS — E1142 Type 2 diabetes mellitus with diabetic polyneuropathy: Secondary | ICD-10-CM

## 2018-05-10 DIAGNOSIS — M25532 Pain in left wrist: Secondary | ICD-10-CM | POA: Diagnosis not present

## 2018-05-10 DIAGNOSIS — Z7901 Long term (current) use of anticoagulants: Secondary | ICD-10-CM | POA: Diagnosis not present

## 2018-05-10 DIAGNOSIS — M7989 Other specified soft tissue disorders: Secondary | ICD-10-CM | POA: Diagnosis not present

## 2018-05-10 DIAGNOSIS — Z5181 Encounter for therapeutic drug level monitoring: Secondary | ICD-10-CM

## 2018-05-10 LAB — POCT GLYCOSYLATED HEMOGLOBIN (HGB A1C): HbA1c, POC (controlled diabetic range): 6.5 % (ref 0.0–7.0)

## 2018-05-10 LAB — POCT INR: INR: 2.2 (ref 2.0–3.0)

## 2018-05-10 MED ORDER — TRAMADOL-ACETAMINOPHEN 37.5-325 MG PO TABS: 1.0000 | ORAL_TABLET | Freq: Four times a day (QID) | ORAL | 0 refills | Status: DC | PRN

## 2018-05-10 MED ORDER — WRIST BRACE MISC: 1.0000 | Freq: Once | 0 refills | Status: AC

## 2018-05-10 NOTE — Patient Instructions (Addendum)
  Wrist Pain, Adult There are many things that can cause wrist pain. Some common causes include:  An injury to the wrist area.  Overuse of the joint.  A condition that causes too much pressure to be put on a nerve in the wrist (carpal tunnel syndrome).  Wear and tear of the joints that happens as a person gets older (osteoarthritis).  Other types of arthritis.  Sometimes, the cause of wrist pain is not known. Often, the pain goes away when you follow your doctor's instructions for helping pain at home, such as resting or icing your wrist. If your wrist pain does not go away, it is important to tell your doctor. Follow these instructions at home:  Rest the wrist area for 48 hours or more, or as long as told by your doctor.  If a splint or elastic bandage has been put on your wrist, use it as told by your doctor. ? Take off the splint or bandage only as told by your doctor. ? Loosen the splint or bandage if your fingers tingle, lose feeling (get numb), or turn cold or blue.  If directed, apply ice to the injured area: ? If you have a removable splint or elastic bandage, remove it as told by your doctor. ? Put ice in a plastic bag. ? Place a towel between your skin and the bag or between your splint or bandage and the bag. ? Leave the ice on for 20 minutes, 2-3 times a day.  Keep your arm raised (elevated) above the level of your heart while you are sitting or lying down.  Take over-the-counter and prescription medicines only as told by your doctor.  Keep all follow-up visits as told by your doctor. This is important. Contact a doctor if:  You have a sudden sharp pain in the wrist, hand, or arm that is different or new.  The swelling or bruising on your wrist or hand gets worse.  Your skin becomes red, gets a rash, or has open sores.  Your pain does not get better or it gets worse. Get help right away if:  You lose feeling in your fingers or hand.  Your fingers turn white,  very red, or cold and blue.  You cannot move your fingers.  You have a fever or chills. This information is not intended to replace advice given to you by your health care provider. Make sure you discuss any questions you have with your health care provider. Document Released: 02/28/2008 Document Revised: 04/06/2016 Document Reviewed: 03/30/2016 Elsevier Interactive Patient Education  [employer].

## 2018-05-10 NOTE — Progress Notes (Signed)
Subjective:    Roberto [name] is an 57 y.o. male who presents for evaluation of left wrist pain. Onset was sudden, starting about 1 week ago. The pain is moderate, worsens with movement, and is relieved by rest and wrapping. There is associated swelling in hand and wrist. There is no history of injury, strain, overuse. Evaluation to date: none. Treatment to date: OTC analgesics, ice, avoidance of offending activity, ace wrap.  The following portions of the patient's history were reviewed and updated as appropriate: allergies, current medications, past family history, past medical history, past social history, past surgical history and problem list.  Review of Systems Pertinent items noted in HPI and remainder of comprehensive ROS otherwise negative.   Objective:    BP 110/60   Pulse 77   Temp 99.7 F (37.6 C) (Oral)   Ht 6' (1.829 m)   Wt (!) 372 lb (168.7 kg)   SpO2 90% Comment: _0  usually uses 5  BMI 50.45 kg/m  Right wrist:  normal exam, no swelling, tenderness, instability; ligaments intact, full ROM both hands, wrists, and finger joints  Left wrist:  soft tissue tenderness and swelling at the hand and wrist, reduced range of motion of grip due to pain, radial pulse normal, sensation normal and abrasions of lateral surface of middle finger without drainage or significant erythema    Assessment:  Suspected  Wrist strain on the left side . History of lower extremity DVT, but pt is therapuetic on Warfarin today, so not likely. Other causes of isolated swelling in pain are gout/psuedogout, RA, other inflammatory athropathies. Given patient has no systemic symptoms, these are felt to be unlikely.   Plan:    Resr, ice, compression, and elevation (RICE) therapy. Reduction in offending activity discussed. Plain film x-rays per orders. Follow up in 1 week. Ultracet for pain. Avoiding NSAIDs due to chronic anticoagulation.

## 2018-05-13 ENCOUNTER — Other Ambulatory Visit: Payer: Self-pay | Admitting: Family Medicine

## 2018-05-13 DIAGNOSIS — I10 Essential (primary) hypertension: Secondary | ICD-10-CM

## 2018-05-14 ENCOUNTER — Encounter (HOSPITAL_COMMUNITY): Payer: Self-pay

## 2018-05-14 ENCOUNTER — Ambulatory Visit (HOSPITAL_COMMUNITY): Payer: Medicare HMO

## 2018-05-14 ENCOUNTER — Other Ambulatory Visit: Payer: Self-pay

## 2018-05-14 VITALS — HR 72

## 2018-05-14 DIAGNOSIS — R29898 Other symptoms and signs involving the musculoskeletal system: Secondary | ICD-10-CM

## 2018-05-14 DIAGNOSIS — M25512 Pain in left shoulder: Secondary | ICD-10-CM

## 2018-05-14 DIAGNOSIS — M25612 Stiffness of left shoulder, not elsewhere classified: Secondary | ICD-10-CM

## 2018-05-14 DIAGNOSIS — M6281 Muscle weakness (generalized): Secondary | ICD-10-CM

## 2018-05-14 DIAGNOSIS — R2689 Other abnormalities of gait and mobility: Secondary | ICD-10-CM | POA: Diagnosis not present

## 2018-05-14 DIAGNOSIS — G8929 Other chronic pain: Secondary | ICD-10-CM

## 2018-05-14 NOTE — Therapy (Signed)
[place], [place], [address] Phone: [phone]   Fax:  [phone]  Occupational Therapy Evaluation  Patient Details  Name: Roberto [name] MRN: [id number] Date of Birth: Apr 03, 1961 Referring Provider: Kinnie Feil, MD   Encounter Date: 05/14/2018  OT End of Session - 05/14/18 1608    Visit Number  1    Number of Visits  8    Date for OT Re-Evaluation  06/11/18    Authorization Type  [employer]    Authorization Time Period  $10 copay. No visit limit- based on medical necessity    OT Start Time  0907    OT Stop Time  0945    OT Time Calculation (min)  38 min    Activity Tolerance  Patient tolerated treatment well    Behavior During Therapy  [facility] for tasks assessed/performed       Past Medical History:  Diagnosis Date  . Asthma   . CHF (congestive heart failure) ([place])   . COPD (chronic obstructive pulmonary disease) ([place])   . Diabetes mellitus without complication ([place])   . Hypertension     Past Surgical History:  Procedure Laterality Date  . SHOULDER SURGERY      There were no vitals filed for this visit.  Subjective Assessment - 05/14/18 1158    Subjective   S: I have noticed that it's beginning to be uncomfortable with sleeping on my stomach.     Pertinent History  Patient is a 57 y/o male with history of left shoulder manipulation which patient is unsure of the timeframe of completion. Patient presents to clinic with complain of left shoulder pain which is causing discomfort when trying to sleep in his preferred position. Dr. Ane Payment has referred patient to occupational therapy for evaluation and treatment.     Special Tests  FOTO : Complete next session.    Patient Stated Goals  To be able to sleep comfortably with less pain.     Currently in Pain?  No/denies   When not using it 7/85. 8/85 with certain movements and with preferred sleeping pattern.        [facility] OT Assessment - 05/14/18  0844      Assessment   Medical Diagnosis  Left shoulder pain    Referring Provider  Kinnie Feil, MD    Onset Date/Surgical Date  --   ~approximately 1 year   Hand Dominance  Right    Prior Therapy  Shoulder manipulation completed maybe over a year ago and received therapy at [facility] in [place], [place]      Precautions   Precautions  Other (comment)    Precaution Comments  Supplemental O2 for activity: patient requires 5L/min via nasal canula      Restrictions   Weight Bearing Restrictions  No      Balance Screen   Has the patient fallen in the past 6 months  No      Home  Environment   Family/patient expects to be discharged to:  Private residence      Prior Function   Level of Independence  Independent;Requires assistive device for independence    Vocation  On disability    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 Recently, he reports that he has not been as comfortable in this position. Patient feels that his strength is at baseline. He is unsure if his ROM has decreased since finishing with his PT post manipulation although states that it may be slightly less.       Mobility   Mobility Status  Independent      Written Expression   Dominant Hand  Right      Vision - History   Baseline Vision  No visual deficits      Activity Tolerance   Activity Tolerance Comments  Patient is a stomach sleeper and likes his left arm under his pillow. Recently has not been comfortable enough to do.       Observation/Other Assessments   Focus on Therapeutic Outcomes (FOTO)   Complete next session      ROM / Strength   AROM / PROM / Strength  AROM;PROM;Strength      Palpation   Palpation comment  Mod fascial restrictions in left upper arm, trapezius, and scapularis  region.       AROM   Overall AROM   Deficits    Overall AROM Comments  Assessed seated. IR/er adducted    AROM Assessment Site  Shoulder    Right/Left Shoulder  Left    Left Shoulder Flexion  110 Degrees    Left Shoulder ABduction  81 Degrees    Left Shoulder Internal Rotation  90 Degrees    Left Shoulder External Rotation  40 Degrees      PROM   Overall PROM   Deficits    Overall PROM Comments  Assessed seated. Unable to take measurements due to time constraint. Passive flexion appears to equal A/ROM. Therapist was able to passively complete abduction close to full range. External rotation slightly more passively.     PROM Assessment Site  Shoulder    Right/Left Shoulder  Left      Strength   Overall Strength Comments  Assessed seated. IR/er adducted    Strength Assessment Site  Shoulder    Right/Left Shoulder  Left    Left Shoulder Flexion  4/5    Left Shoulder ABduction  4/5    Left Shoulder Internal Rotation  4+/5    Left Shoulder External Rotation  4+/5                      OT Education - 05/14/18 1605    Education Details  Shoulder stretches. Discussed preferred sleeping position and strongly encouraged patient to consider changing as sleeping on his stomach is not helping his left arm pain due to positioning of arm and rotation of neck when sleeping. In addition to effecting other joints by changing his sleeping position, it will help more than his left shoulder pain.     Person(s) Educated  Patient    Methods  Explanation;Demonstration;Verbal cues;Handout    Comprehension  Verbalized understanding;Returned demonstration;Need further instruction       OT Short Term Goals - 05/14/18 1616      OT SHORT TERM GOAL #1   Title  Patient will be educated and independent with HEP to increase functional use of LUE and comfort level when sleeping at night.    Time  4    Period  Weeks    Status  New    Target Date  06/11/18      OT SHORT TERM GOAL #2   Title   Patient will increase A/ROM by  10 degrees or prior to baseline functional range per patient's report to increase functional reaching ability and allow patient to obtain a comfortable sleeping position.     Time  4    Period  Weeks    Status  New      OT SHORT TERM GOAL #3   Title  Patient will report a decreased pain level in LUE with certain movements or positions of approximately 4/10 in order to be able to complete preferred daily tasks with increased comfort.     Time  4    Period  Weeks    Status  New      OT SHORT TERM GOAL #4   Title  Patient will decrease fascial restrictions in left UE to min amount or less in order to increase functional mobility needed to complete daily tasks.     Time  4    Period  Weeks    Status  New               Plan - 05/14/18 1612    Clinical Impression Statement  A: patient is a 57 y/o male S/P left shoulder pain with a history of shoulder manipulation causing increased pain, fascial restrictions and decreased ROM resulting in difficulty completing daily tasks and gaining a restful night of sleep.    Occupational Profile and client history currently impacting functional performance  Positive results from previous therapy.     Occupational performance deficits (Please refer to evaluation for details):  Rest and Sleep;ADL's;IADL's    Rehab Potential  Good    Current Impairments/barriers affecting progress:  history of left shoulder manipulation with loss of ROM. Hestitant to change sleeping position. Poor historian.    OT Frequency  2x / week    OT Duration  4 weeks    OT Treatment/Interventions  Self-care/ADL training;Moist Heat;DME and/or AE instruction;Therapeutic activities;Ultrasound;Therapeutic exercise;Cryotherapy;Electrical Stimulation;Manual Therapy;Patient/family education;Passive range of motion    Plan  P: Patient will benefit from skilled OT services to increase functional use of LUE during daily tasks as well as increased comfort  when sleeping. Treatment plan: Education on better sleeping positions. myofascial release, passive ROM, A/ROM, general scapular stability as needed. Modalities PRN.     Clinical Decision Making  Limited treatment options, no task modification necessary    Consulted and Agree with Plan of Care  Patient       Patient will benefit from skilled therapeutic intervention in order to improve the following deficits and impairments:  Decreased range of motion, Increased fascial restrictions, Pain, Impaired UE functional use  Visit Diagnosis: Other symptoms and signs involving the musculoskeletal system - Plan: Ot plan of care cert/re-cert  Chronic left shoulder pain - Plan: Ot plan of care cert/re-cert  Stiffness of left shoulder, not elsewhere classified - Plan: Ot plan of care cert/re-cert    Problem List Patient Active Problem List   Diagnosis Date Noted  . Lesions of both ulnar nerves 04/11/2018  . Solitary pulmonary nodule 06/20/2017  . Anticoagulation management encounter 06/05/2017  . GERD without esophagitis 05/10/2017  . Acute deep vein thrombosis (DVT) of femoral vein of left lower extremity ([place])   . Essential hypertension   . Morbid obesity ([place])   . Dyspnea   . Bradycardia   . Saphenous vein clot, left 05/04/2017  . Cor pulmonale, chronic ([place]) 05/04/2017  . Type 2 diabetes mellitus with diabetic polyneuropathy, without long-term current use of insulin ([place])   . Chronic respiratory failure with hypoxia ([place])   .  Acute deep vein thrombosis (DVT) of proximal vein of left lower extremity ([place])   . Combined congestive systolic and diastolic heart failure ([place])   . Atrial fibrillation with slow ventricular response ([place])   . Community acquired pneumonia    Ailene Ravel, OTR/L,CBIS  [phone]  05/14/2018, 4:37 PM  [facility] [address] [place], [place], [address] Phone: [phone]   Fax:  [phone]  Name: Roberto [name] MRN: [id number] Date of Birth: May 26, 1961

## 2018-05-14 NOTE — Patient Instructions (Signed)
Complete the following exercises 2-3 times a day.  Doorway Stretch  Place each hand opposite each other on the doorway. (You can change where you feel the stretch by moving arms higher or lower.) Step through with one foot and bend front knee until a stretch is felt and hold. Step through with the opposite foot on the next rep. Hold for __10-15___ seconds. Repeat __2__times.     Scapular Retraction (Standing)   With arms at sides, pinch shoulder blades together. Repeat __10__ times per set. Do __1__ sets per session. Do __2__ sessions per day.     Posterior Capsule Stretch   Stand or sit, one arm across body so hand rests over opposite shoulder. Gently push on crossed elbow with other hand until stretch is felt in shoulder of crossed arm. Hold _10-15__ seconds.  Repeat _2__ times per session. Do ___ sessions per day.   Wall Flexion  Slide your arm up the wall or door frame until a stretch is felt in your shoulder . Hold for 10-15 seconds. Complete 2 times     Shoulder Abduction Stretch  Stand side ways by a wall with affected up on wall. Gently step in toward wall to feel stretch. Hold for 10-15 seconds. Complete 2 times.

## 2018-05-14 NOTE — Therapy (Signed)
[place], [place], [address] Phone: [phone]   Fax:  [phone]  Physical Therapy Treatment  Patient Details  Name: Roberto [name] MRN: [id number] Date of Birth: 1960-11-14 Referring Provider: Kinnie Feil, MD   Encounter Date: 05/14/2018  PT End of Session - 05/14/18 0827    Visit Number  4    Number of Visits  13    Date for PT Re-Evaluation  06/11/18    Authorization Type  [employer] Medicare HMO (no auth required, no visit limit)    Authorization Time Period  04/30/18 - 06/14/18    Authorization - Visit Number  4    Authorization - Number of Visits  10    PT Start Time  [phone]   pt late for apt   PT Stop Time  0904    PT Time Calculation (min)  41 min    Activity Tolerance  Patient tolerated treatment well;Other (comment)   Decreased O2 saturation through session   Behavior During Therapy  [facility] for tasks assessed/performed       Past Medical History:  Diagnosis Date  . Asthma   . CHF (congestive heart failure) ([place])   . COPD (chronic obstructive pulmonary disease) ([place])   . Diabetes mellitus without complication ([place])   . Hypertension     Past Surgical History:  Procedure Laterality Date  . SHOULDER SURGERY      Vitals:   05/14/18 0827 05/14/18 0835 05/14/18 0902  Pulse:  92 72  SpO2: 94% (!) 88% 95%    Subjective Assessment - 05/14/18 0825    Subjective  Pt stated he did a lot of work yesterday, setting up bed frame stated his knees a little sore.  Pt arrived with ACE bandage on Lt hand, reports he went to MD stated he may have fractured wrist, waiting on MD to inform him on Friday stated it does feel better today.    Patient Stated Goals  improve endurance and lose weight    Currently in Pain?  No/denies                       [facility] Adult PT Treatment/Exercise - 05/14/18 0001      Exercises   Exercises  Knee/Hip      Knee/Hip Exercises: Aerobic   Nustep  Interval  training: 3x 3 minutes at moderate intensity, 1 minute easy rest      Knee/Hip Exercises: Standing   Heel Raises  Both;1 set;10 reps;Limitations    Heel Raises Limitations  at // bars    Hip Abduction  10 reps    Abduction Limitations  at // bars      Knee/Hip Exercises: Seated   Long Arc Quad  Strengthening;10 reps;2 sets;Both    Other Seated Knee/Hip Exercises  Pursed lip breathing instructed through session    Sit to Sand  2 sets;10 reps               PT Short Term Goals - 04/30/18 1417      PT SHORT TERM GOAL #1   Title  Patient will demonstrate more consitant use of supplemental oxygen and use of pursed lip breathing to maintain improved SpO2 levels of 85% or higher throughout entire session.    Time  3    Period  Weeks      PT SHORT TERM GOAL #2   Title  Patient will improve MMT by 1/2 grade for limited  groups to demonstrate improved LE strength to improve ease of gait and stair mobility.    Time  3    Period  Weeks    Status  New    Target Date  05/21/18      PT SHORT TERM GOAL #3   Title  Patient will perform 5x sit to stand from standard height without use of UE's in equal to evaluation time or better to indicate decresased reliance on UE and improved LE strength.     Time  3    Period  Weeks    Status  New      PT SHORT TERM GOAL #4   Title  Patient will obtain new compression garments to wear during the day and demonstrate safe/correct donning/doffing of garements to manage LE edema independently.    Time  3    Period  Weeks    Status  New        PT Long Term Goals - 04/30/18 1423      PT LONG TERM GOAL #1   Title  Patient will register for and particiapte/attend the free Diabetes Education classes that are held at [facility] by the [employer] to improve knowledge on healhty eating adn lifestyle changes to improve management of weight loss and diabetes/multiple medical conditions management.    Time  4     Period  Weeks    Status  New    Target Date  05/28/18      PT LONG TERM GOAL #2   Title  Patient will improve MMT by 1 grade for limited groups to demonstrate improved LE strength to improve ease of gaita nd stair mobility    Time  6    Period  Weeks    Status  New    Target Date  06/11/18      PT LONG TERM GOAL #3   Title  Patient will improve 5x sit to stand test by 5 or more seconds to demonstrate significant improvement in LE strength and improved activity tolerance.     Time  6    Period  Weeks    Status  New      PT LONG TERM GOAL #4   Title  Patient will improve 6MWT distance by 177 feet to demosntrate significant change in cardiopulmonary endurance with functional gait.     Time  6    Period  Weeks    Status  New      PT LONG TERM GOAL #5   Title  Patient will reports consistant participation with exercise log for 150 min/week for 2 week period to improve overall healthy lifestyle and demonstrate increased self motivation to participate in exercise to improve health and work towards weight loss.     Time  6    Period  Weeks    Status  New            Plan - 05/14/18 0840    Clinical Impression Statement  Continued session focus with functional strengthening and cardiovascular endurate training with intervals on Nustep today.  Including pursed lip breathing instructions through session with 5L O2A through session. with ability to maintain O2 saturation above 85% with average status between 91-96%.  While on Nustep discussion held with f/u compression hose, pt stated he hasn't gotten around to it, educated benefits for edema control.  Added standing hip abduction for hip strengthening.  No reports of pain through session, was limited by  fatigue.      Rehab Potential  Fair    PT Frequency  2x / week    PT Duration  6 weeks    PT Treatment/Interventions  ADLs/Self Care Home Management;Aquatic Therapy;Cryotherapy;Electrical Stimulation;Moist Heat;Stair training;Functional  mobility training;Therapeutic activities;Balance training;Therapeutic exercise;Patient/family education;Manual techniques;Energy conservation    PT Next Visit Plan  F/U on compression garment purchase and educate on appropriate use. Continue education on importance of using O2 as instructed.  Review pursed lip breathing and progress HEP as able. Continue with interval training on Nustep next session and use Borg RPE scale to target moderate intensity. Monitor SpO2 throughout.     PT Home Exercise Plan  Eval: pursed lip breathing. 05/09/18 - sit to stand, LAQ, heel raises       Patient will benefit from skilled therapeutic intervention in order to improve the following deficits and impairments:  Abnormal gait, Decreased balance, Decreased endurance, Decreased mobility, Difficulty walking, Obesity, Increased edema, Cardiopulmonary status limiting activity, Decreased activity tolerance, Decreased strength  Visit Diagnosis: Other abnormalities of gait and mobility  Muscle weakness (generalized)     Problem List Patient Active Problem List   Diagnosis Date Noted  . Lesions of both ulnar nerves 04/11/2018  . Solitary pulmonary nodule 06/20/2017  . Anticoagulation management encounter 06/05/2017  . GERD without esophagitis 05/10/2017  . Acute deep vein thrombosis (DVT) of femoral vein of left lower extremity ([place])   . Essential hypertension   . Morbid obesity ([place])   . Dyspnea   . Bradycardia   . Saphenous vein clot, left 05/04/2017  . Cor pulmonale, chronic ([place]) 05/04/2017  . Type 2 diabetes mellitus with diabetic polyneuropathy, without long-term current use of insulin ([place])   . Chronic respiratory failure with hypoxia ([place])   . Acute deep vein thrombosis (DVT) of proximal vein of left lower extremity ([place])   . Combined congestive systolic and diastolic heart failure ([place])   . Atrial fibrillation with slow ventricular response ([place])   . Community acquired pneumonia    Ihor Austin,  [place]; [place]  Aldona Lento 05/14/2018, 9:12 AM  [place], [place], [address] Phone: [phone]   Fax:  [phone]  Name: Judea Riches MRN: [id number] Date of Birth: 1961-02-01

## 2018-05-16 ENCOUNTER — Encounter (HOSPITAL_COMMUNITY): Payer: Self-pay

## 2018-05-16 ENCOUNTER — Encounter (HOSPITAL_COMMUNITY): Payer: Self-pay | Admitting: Occupational Therapy

## 2018-05-16 ENCOUNTER — Ambulatory Visit (HOSPITAL_COMMUNITY): Payer: Medicare HMO | Admitting: Occupational Therapy

## 2018-05-16 ENCOUNTER — Ambulatory Visit (HOSPITAL_COMMUNITY): Payer: Medicare HMO

## 2018-05-16 VITALS — HR 81

## 2018-05-16 DIAGNOSIS — M25612 Stiffness of left shoulder, not elsewhere classified: Secondary | ICD-10-CM | POA: Diagnosis not present

## 2018-05-16 DIAGNOSIS — R29898 Other symptoms and signs involving the musculoskeletal system: Secondary | ICD-10-CM

## 2018-05-16 DIAGNOSIS — R2689 Other abnormalities of gait and mobility: Secondary | ICD-10-CM | POA: Diagnosis not present

## 2018-05-16 DIAGNOSIS — M25512 Pain in left shoulder: Secondary | ICD-10-CM | POA: Diagnosis not present

## 2018-05-16 DIAGNOSIS — M6281 Muscle weakness (generalized): Secondary | ICD-10-CM | POA: Diagnosis not present

## 2018-05-16 DIAGNOSIS — G8929 Other chronic pain: Secondary | ICD-10-CM

## 2018-05-16 NOTE — Therapy (Signed)
[place], [place], [address] Phone: [phone]   Fax:  [phone]  Occupational Therapy Treatment  Patient Details  Name: Roberto [name] MRN: [id number] Date of Birth: [birth year] Referring Provider: Kinnie Feil, MD   Encounter Date: 05/16/2018  OT End of Session - 05/16/18 1207    Visit Number  2    Number of Visits  8    Date for OT Re-Evaluation  06/11/18    Authorization Type  [employer] Medicare    Authorization Time Period  $10 copay. No visit limit- based on medical necessity    OT Start Time  1116    OT Stop Time  1156    OT Time Calculation (min)  40 min    Activity Tolerance  Patient tolerated treatment well    Behavior During Therapy  [facility] for tasks assessed/performed       Past Medical History:  Diagnosis Date  . Asthma   . CHF (congestive heart failure) ([place])   . COPD (chronic obstructive pulmonary disease) ([place])   . Diabetes mellitus without complication ([place])   . Hypertension     Past Surgical History:  Procedure Laterality Date  . SHOULDER SURGERY      There were no vitals filed for this visit.  Subjective Assessment - 05/16/18 1116    Subjective   S: It doesn't hurt just all the time.     Currently in Pain?  No/denies         [facility] OT Assessment - 05/16/18 1116      Assessment   Medical Diagnosis  Left shoulder pain      Precautions   Precautions  Other (comment)    Precaution Comments  Supplemental O2 for activity: patient requires 5L/min via nasal canula               OT Treatments/Exercises (OP) - 05/16/18 1118      Exercises   Exercises  Shoulder      Shoulder Exercises: Supine   Protraction  PROM;5 reps;AAROM;12 reps    Horizontal ABduction  PROM;5 reps    External Rotation  PROM;5 reps;AROM;12 reps    Internal Rotation  PROM;5 reps;AROM;12 reps    Flexion  PROM;5 reps;AAROM;12 reps      Shoulder Exercises: Seated   Retraction  AROM;10 reps    Protraction  AROM;10 reps    Horizontal ABduction  AROM;10 reps    External Rotation  AROM;10 reps    Internal Rotation  AROM;10 reps    Flexion  AAROM;10 reps    Abduction  AROM;10 reps                                   Shoulder Exercises: Pulleys   Flexion  2 minutes    ABduction  2 minutes    ABduction Limitations  1 rest break      Shoulder Exercises: ROM/Strengthening   X to V Arms  10X    Proximal Shoulder Strengthening, Supine  10X each no rest breaks      Shoulder Exercises: Stretch   Cross Chest Stretch  3 reps;10 seconds    Wall Stretch - Flexion  3 reps;10 seconds    Wall Stretch - ABduction  3 reps;10 seconds      Manual Therapy   Manual Therapy  Myofascial release    Manual therapy comments  completed separately from therapeutic  exercise    Myofascial Release  myofascial release and manual techniques completed to left upper arm, deltoid, and scapularis regions to decrease pain and fascial restrictions and improve joint range of motion             OT Education - 05/16/18 1132    Education Details  Discussed sleeping habits and positions and encouraged pt to trial a new sleep position over the weekend. Pt agreeable to try sleeping with pillow as wedge underneath his back to prop up on his left side, leaving arm by side instead of underneath head and neck    Person(s) Educated  Patient    Methods  Explanation    Comprehension  Verbalized understanding       OT Short Term Goals - 05/16/18 1131      OT SHORT TERM GOAL #1   Title  Patient will be educated and independent with HEP to increase functional use of LUE and comfort level when sleeping at night.    Time  4    Period  Weeks    Status  On-going      OT SHORT TERM GOAL #2   Title  Patient will increase A/ROM by 10 degrees or prior to baseline functional range per patient's report to increase functional reaching ability and allow patient to obtain a comfortable sleeping position.     Time  4     Period  Weeks    Status  On-going      OT SHORT TERM GOAL #3   Title  Patient will report a decreased pain level in LUE with certain movements or positions of approximately 4/10 in order to be able to complete preferred daily tasks with increased comfort.     Time  4    Period  Weeks    Status  On-going      OT SHORT TERM GOAL #4   Title  Patient will decrease fascial restrictions in left UE to min amount or less in order to increase functional mobility needed to complete daily tasks.     Time  4    Period  Weeks    Status  On-going               Plan - 05/16/18 1134    Clinical Impression Statement  A: Initiated myofascial release, manual therapy, AA/ROM, A/ROM, and proximal shoulder strengthening this session working to improve shoulder ROM. Also reviewed shoulder stretches pt is completing as HEP, pt unable to complete doorway/corner stretch due to pain in left wrist. Completed pulleys working on improving ROM. Verbal cuing for form and technique during session.     Plan  P: Follow up on sleeping in new position and continue to educate on sleepig positions. Complete all A/ROM, add scapular theraband       Patient will benefit from skilled therapeutic intervention in order to improve the following deficits and impairments:  Decreased range of motion, Increased fascial restrictions, Pain, Impaired UE functional use  Visit Diagnosis: Other symptoms and signs involving the musculoskeletal system  Chronic left shoulder pain  Stiffness of left shoulder, not elsewhere classified    Problem List Patient Active Problem List   Diagnosis Date Noted  . Lesions of both ulnar nerves 04/11/2018  . Solitary pulmonary nodule 06/20/2017  . Anticoagulation management encounter 06/05/2017  . GERD without esophagitis 05/10/2017  . Acute deep vein thrombosis (DVT) of femoral vein of left lower extremity ([place])   . Essential hypertension   .  Morbid obesity ([place])   . Dyspnea   .  Bradycardia   . Saphenous vein clot, left 05/04/2017  . Cor pulmonale, chronic ([place]) 05/04/2017  . Type 2 diabetes mellitus with diabetic polyneuropathy, without long-term current use of insulin ([place])   . Chronic respiratory failure with hypoxia ([place])   . Acute deep vein thrombosis (DVT) of proximal vein of left lower extremity ([place])   . Combined congestive systolic and diastolic heart failure ([place])   . Atrial fibrillation with slow ventricular response ([place])   . Community acquired pneumonia    Guadelupe Sabin, OTR/L  [phone] 05/16/2018, 12:09 PM  [place], [place], [address] Phone: [phone]   Fax:  [phone]  Name: Roberto [name] MRN: [id number] Date of Birth: 1960/12/18

## 2018-05-16 NOTE — Therapy (Signed)
[place], [place], [address] Phone: [phone]   Fax:  [phone]  Physical Therapy Treatment  Patient Details  Name: Roberto [name] MRN: [id number] Date of Birth: 1961-06-03 Referring Provider: Kinnie Feil, MD   Encounter Date: 05/16/2018  PT End of Session - 05/16/18 1014    Visit Number  5    Number of Visits  13    Date for PT Re-Evaluation  06/11/18    Authorization Type  [employer] Medicare HMO (no auth required, no visit limit)    Authorization Time Period  04/30/18 - 06/14/18    Authorization - Visit Number  5    Authorization - Number of Visits  10    PT Start Time  1012    PT Stop Time  1055    PT Time Calculation (min)  43 min    Activity Tolerance  Patient tolerated treatment well;Other (comment)   Decreased O2 saturation through session.   Behavior During Therapy  [facility] for tasks assessed/performed       Past Medical History:  Diagnosis Date  . Asthma   . CHF (congestive heart failure) ([place])   . COPD (chronic obstructive pulmonary disease) ([place])   . Diabetes mellitus without complication ([place])   . Hypertension     Past Surgical History:  Procedure Laterality Date  . SHOULDER SURGERY      Vitals:   05/16/18 1011 05/16/18 1037  Pulse: 91 81  SpO2: 90% 91%    Subjective Assessment - 05/16/18 1011    Subjective  Pt arrived with ACE bandage on Lt wrist, stated some pain with pressure, has apt with MD tomorrow.  Pt stated his knees are a little sore, has been doing a lot more than normal, no real pain just soreness.      Pertinent History  Significant history for A-fib, CHF (cor pulmonale), COPD, HTN, history of DVT's, Lt frozen shoulder    Patient Stated Goals  improve endurance and lose weight    Currently in Pain?  No/denies    Pain Score  0-No pain    Pain Location  Knee    Pain Orientation  Right;Left    Pain Descriptors / Indicators  Sore                        [facility] Adult PT Treatment/Exercise - 05/16/18 0001      Exercises   Exercises  Knee/Hip      Knee/Hip Exercises: Aerobic   Nustep  Interval training: 2x 3 minutes at moderate intensity, 1 minute easy rest      Knee/Hip Exercises: Machines for Strengthening   Total Gym Leg Press  20# each LE    Other Machine  power tower at 29 degrees 10x      Knee/Hip Exercises: Standing   Heel Raises  Both;10 reps;Limitations;2 sets    Heel Raises Limitations  toe raises 20x at // bars    Hip Abduction  15 reps    Abduction Limitations  at // bars      Knee/Hip Exercises: Seated   Other Seated Knee/Hip Exercises  Pursed lip breathing instructed through session    Sit to Sand  1 set;10 reps;without UE support   decreased sets due to knee soreness              PT Short Term Goals - 04/30/18 1417      PT SHORT TERM GOAL #1  Title  Patient will demonstrate more consitant use of supplemental oxygen and use of pursed lip breathing to maintain improved SpO2 levels of 85% or higher throughout entire session.    Time  3    Period  Weeks      PT SHORT TERM GOAL #2   Title  Patient will improve MMT by 1/2 grade for limited groups to demonstrate improved LE strength to improve ease of gait and stair mobility.    Time  3    Period  Weeks    Status  New    Target Date  05/21/18      PT SHORT TERM GOAL #3   Title  Patient will perform 5x sit to stand from standard height without use of UE's in equal to evaluation time or better to indicate decresased reliance on UE and improved LE strength.     Time  3    Period  Weeks    Status  New      PT SHORT TERM GOAL #4   Title  Patient will obtain new compression garments to wear during the day and demonstrate safe/correct donning/doffing of garements to manage LE edema independently.    Time  3    Period  Weeks    Status  New        PT Long Term Goals - 04/30/18 1423      PT LONG TERM GOAL #1   Title   Patient will register for and particiapte/attend the free Diabetes Education classes that are held at [facility] by the [employer] to improve knowledge on healhty eating adn lifestyle changes to improve management of weight loss and diabetes/multiple medical conditions management.    Time  4    Period  Weeks    Status  New    Target Date  05/28/18      PT LONG TERM GOAL #2   Title  Patient will improve MMT by 1 grade for limited groups to demonstrate improved LE strength to improve ease of gaita nd stair mobility    Time  6    Period  Weeks    Status  New    Target Date  06/11/18      PT LONG TERM GOAL #3   Title  Patient will improve 5x sit to stand test by 5 or more seconds to demonstrate significant improvement in LE strength and improved activity tolerance.     Time  6    Period  Weeks    Status  New      PT LONG TERM GOAL #4   Title  Patient will improve 6MWT distance by 177 feet to demosntrate significant change in cardiopulmonary endurance with functional gait.     Time  6    Period  Weeks    Status  New      PT LONG TERM GOAL #5   Title  Patient will reports consistant participation with exercise log for 150 min/week for 2 week period to improve overall healthy lifestyle and demonstrate increased self motivation to participate in exercise to improve health and work towards weight loss.     Time  6    Period  Weeks    Status  New            Plan - 05/16/18 1101    Clinical Impression Statement  Pt arrived with c/o increased knee soreness, no real pain.  Reports increased activities around the house, MD  apts and therapy sessions.  Session focus with functional strengthening and cardiovascular endurance training wiht intervals on Nustep.  Continued instructed pursed lip breathing through sessoin wiht 5L O2 A through sessioun.  Pt able to maintain O2 saturation levels above 84% with average range between 89-94%.  Added power tower and  leg press for strengthening.  No reports of increased pain through session.  Received signed order for compression hose, given to pt and discussed places of purchase, proper wearing time and benefits with edema control.    Rehab Potential  Fair    PT Frequency  2x / week    PT Duration  6 weeks    PT Treatment/Interventions  ADLs/Self Care Home Management;Aquatic Therapy;Cryotherapy;Electrical Stimulation;Moist Heat;Stair training;Functional mobility training;Therapeutic activities;Balance training;Therapeutic exercise;Patient/family education;Manual techniques;Energy conservation    PT Next Visit Plan  F/U on compression garment purchase and educate on appropriate use. Continue education on importance of using O2 as instructed.  Review pursed lip breathing and progress HEP as able. Continue with interval training on Nustep next session and use Borg RPE scale to target moderate intensity. Monitor SpO2 throughout.  Begin quad/hamstring machines when appropriate for strengthening.      PT Home Exercise Plan  Eval: pursed lip breathing. 05/09/18 - sit to stand, LAQ, heel raises       Patient will benefit from skilled therapeutic intervention in order to improve the following deficits and impairments:  Abnormal gait, Decreased balance, Decreased endurance, Decreased mobility, Difficulty walking, Obesity, Increased edema, Cardiopulmonary status limiting activity, Decreased activity tolerance, Decreased strength  Visit Diagnosis: Muscle weakness (generalized)  Other abnormalities of gait and mobility     Problem List Patient Active Problem List   Diagnosis Date Noted  . Lesions of both ulnar nerves 04/11/2018  . Solitary pulmonary nodule 06/20/2017  . Anticoagulation management encounter 06/05/2017  . GERD without esophagitis 05/10/2017  . Acute deep vein thrombosis (DVT) of femoral vein of left lower extremity ([place])   . Essential hypertension   . Morbid obesity ([place])   . Dyspnea   .  Bradycardia   . Saphenous vein clot, left 05/04/2017  . Cor pulmonale, chronic ([place]) 05/04/2017  . Type 2 diabetes mellitus with diabetic polyneuropathy, without long-term current use of insulin ([place])   . Chronic respiratory failure with hypoxia ([place])   . Acute deep vein thrombosis (DVT) of proximal vein of left lower extremity ([place])   . Combined congestive systolic and diastolic heart failure ([place])   . Atrial fibrillation with slow ventricular response ([place])   . Community acquired pneumonia    Ihor Austin, [place]; [place]  Aldona Lento 05/16/2018, 11:08 AM  [place], [place], [address] Phone: [phone]   Fax:  [phone]  Name: Roberto [name] MRN: [id number] Date of Birth: 1961/01/22

## 2018-05-17 ENCOUNTER — Other Ambulatory Visit: Payer: Self-pay

## 2018-05-17 ENCOUNTER — Encounter: Payer: Self-pay | Admitting: Family Medicine

## 2018-05-17 ENCOUNTER — Ambulatory Visit (INDEPENDENT_AMBULATORY_CARE_PROVIDER_SITE_OTHER): Payer: Medicare HMO | Admitting: Family Medicine

## 2018-05-17 VITALS — BP 112/64 | HR 73 | Temp 99.1°F | Ht 72.0 in | Wt 372.2 lb

## 2018-05-17 DIAGNOSIS — M1310 Monoarthritis, not elsewhere classified, unspecified site: Secondary | ICD-10-CM | POA: Diagnosis not present

## 2018-05-17 NOTE — Progress Notes (Signed)
   Subjective:    Roberto [name] - 57 y.o. male MRN [id number]  Date of birth: 05/09/1961  HPI  Roberto [name] is here for follow up of his left wrist pain.  -Started on August 14 -Patient does not know of any inciting incident, although he does say he sleeps on his left side - It is now less tender to palpation and range of motion has improved, although he does feel throbbing occasionally -His left middle finger is also swollen, which is been going on for a couple of months -Has been taking Ultracet for pain and using ice and Biofreeze.  Has also kept his wrist wrapped. -Got an x-ray after seeing Dr. Grandville Silos last week, but he does not know the results of this -Denies any systemic symptoms, including GI distress, chills, subjective fevers, lack of appetite, or weakness - Pain is constant (although improving) and is not worse in the morning or evening   Health Maintenance:  Health Maintenance Due  Topic Date Due  . PNEUMOCOCCAL POLYSACCHARIDE VACCINE AGE 84-64 HIGH RISK  05/21/1962    -  reports that he has never smoked. He has never used smokeless tobacco. - Review of Systems: Per HPI. - Past Medical History: Patient Active Problem List   Diagnosis Date Noted  . Monoarticular arthritis 05/17/2018  . Lesions of both ulnar nerves 04/11/2018  . Solitary pulmonary nodule 06/20/2017  . Anticoagulation management encounter 06/05/2017  . GERD without esophagitis 05/10/2017  . Acute deep vein thrombosis (DVT) of femoral vein of left lower extremity ([place])   . Essential hypertension   . Morbid obesity ([place])   . Dyspnea   . Bradycardia   . Saphenous vein clot, left 05/04/2017  . Cor pulmonale, chronic ([place]) 05/04/2017  . Type 2 diabetes mellitus with diabetic polyneuropathy, without long-term current use of insulin ([place])   . Chronic respiratory failure with hypoxia ([place])   . Acute deep vein thrombosis (DVT) of proximal vein of left lower extremity ([place])   . Combined congestive  systolic and diastolic heart failure ([place])   . Atrial fibrillation with slow ventricular response ([place])   . Community acquired pneumonia    - Medications: reviewed and updated   Objective:   Physical Exam BP 112/64   Pulse 73   Temp 99.1 F (37.3 C) (Oral)   Ht 6' (1.829 m)   Wt (!) 372 lb 3.2 oz (168.8 kg)   SpO2 90%   BMI 50.48 kg/m  Gen: NAD, alert, cooperative with exam, appears chronically ill CV: RRR, good S1/S2, no murmur Resp: CTABL, no wheezes, non-labored Skin: no rashes, normal turgor  Musculoskeletal: no visual deformity or swelling of L wrist, full ROM, 4/5 grip strength and thumb adduction on L side vs R, minimally tender to palpation     Assessment & Plan:   Monoarticular arthritis History and exam do not support a diagnosis of gout or rheumatoid arthritis.  X-ray showed no bony abnormality.  I feel that his arthritis is likely due to mild trauma that occurred during sleep.  I am reassured that his pain is improving.  Gave patient return precautions, including development of systemic symptoms, worsening of pain in joint, and development of similar arthropathy in other joints.    Maia Breslow, M.D. 05/17/2018, 12:22 PM PGY-2, [facility]

## 2018-05-17 NOTE — Patient Instructions (Addendum)
It was nice meeting you today Mr. Hevia!  I think your wrist will continue to heal, but if the pain and swelling continue or involve a different joint, please return to our clinic.  We may collect some blood tests or get some more images at that time.  Please continue with the biofreeze since this will continue to calm the inflammation in your wrist.  If you have any questions or concerns, please feel free to call the clinic.   Be well,  Dr. Shan Levans

## 2018-05-17 NOTE — Assessment & Plan Note (Signed)
History and exam do not support a diagnosis of gout or rheumatoid arthritis.  X-ray showed no bony abnormality.  I feel that his arthritis is likely due to mild trauma that occurred during sleep.  I am reassured that his pain is improving.  Gave patient return precautions, including development of systemic symptoms, worsening of pain in joint, and development of similar arthropathy in other joints.

## 2018-05-21 ENCOUNTER — Ambulatory Visit (HOSPITAL_COMMUNITY): Payer: Medicare HMO | Admitting: Occupational Therapy

## 2018-05-21 ENCOUNTER — Other Ambulatory Visit: Payer: Self-pay

## 2018-05-21 ENCOUNTER — Ambulatory Visit (HOSPITAL_COMMUNITY): Payer: Medicare HMO

## 2018-05-21 ENCOUNTER — Encounter (HOSPITAL_COMMUNITY): Payer: Self-pay

## 2018-05-21 ENCOUNTER — Encounter (HOSPITAL_COMMUNITY): Payer: Self-pay | Admitting: Occupational Therapy

## 2018-05-21 DIAGNOSIS — M25612 Stiffness of left shoulder, not elsewhere classified: Secondary | ICD-10-CM | POA: Diagnosis not present

## 2018-05-21 DIAGNOSIS — R29898 Other symptoms and signs involving the musculoskeletal system: Secondary | ICD-10-CM | POA: Diagnosis not present

## 2018-05-21 DIAGNOSIS — R2689 Other abnormalities of gait and mobility: Secondary | ICD-10-CM

## 2018-05-21 DIAGNOSIS — M6281 Muscle weakness (generalized): Secondary | ICD-10-CM | POA: Diagnosis not present

## 2018-05-21 DIAGNOSIS — G8929 Other chronic pain: Secondary | ICD-10-CM

## 2018-05-21 DIAGNOSIS — M25512 Pain in left shoulder: Secondary | ICD-10-CM | POA: Diagnosis not present

## 2018-05-21 NOTE — Therapy (Signed)
[place], [place], [address] Phone: [phone]   Fax:  [phone]  Physical Therapy Treatment  Patient Details  Name: Roberto [name] MRN: [id number] Date of Birth: 06-09-1961 Referring Provider: Kinnie Feil, MD   Encounter Date: 05/21/2018  PT End of Session - 05/21/18 1159    Visit Number  6    Number of Visits  13    Date for PT Re-Evaluation  06/11/18    Authorization Type  [employer] HMO (no auth required, no visit limit)    Authorization Time Period  04/30/18 - 06/14/18    Authorization - Visit Number  6    Authorization - Number of Visits  10    PT Start Time  1120    PT Stop Time  1202    PT Time Calculation (min)  42 min    Activity Tolerance  Patient tolerated treatment well;Other (comment)   monitor O2 saturation through session.   Behavior During Therapy  [facility] for tasks assessed/performed       Past Medical History:  Diagnosis Date  . Asthma   . CHF (congestive heart failure) ([place])   . COPD (chronic obstructive pulmonary disease) ([place])   . Diabetes mellitus without complication ([place])   . Hypertension     Past Surgical History:  Procedure Laterality Date  . SHOULDER SURGERY      There were no vitals filed for this visit.  Subjective Assessment - 05/21/18 1127    Subjective  Patient reports he is well and went to the doctor about his wrist last Friday. They x-rayed it and did not find any abnormalities. He reports he used the biofreze on his knees and wrist and it seemed to help.     Pertinent History  Significant history for A-fib, CHF (cor pulmonale), COPD, HTN, history of DVT's, Lt frozen shoulder    Limitations  Walking;House hold activities    How long can you sit comfortably?  unlimited    How long can you stand comfortably?  not sure    How long can you walk comfortably?  not sure (not an hour)    Patient Stated Goals  improve endurance and lose weight    Currently in  Pain?  No/denies       [facility] Adult PT Treatment/Exercise - 05/21/18 1127      Exercises   Exercises  Knee/Hip      Knee/Hip Exercises: Aerobic   Nustep  HITT training: 2 minute warm up, 3x 1 minute quick, 1 minte seated rest, 1 minute cool down eay      Knee/Hip Exercises: Machines for Strengthening   Other Machine  power tower at 29 degrees; 2x 10 rps      Knee/Hip Exercises: Standing   Heel Raises  Both;1 set;20 reps;3 seconds    Heel Raises Limitations  toe raises 20x at // bars    Lateral Step Up  Both;1 set;10 reps;Hand Hold: 2;Step Height: 2"    Forward Step Up  Both;1 set;10 reps;Hand Hold: 2;Step Height: 2"      Knee/Hip Exercises: Seated   Other Seated Knee/Hip Exercises  Pursed lip breathing instructed through session    Sit to Sand  1 set;10 reps;without UE support         PT Education - 05/21/18 1237    Education Details  Educated on where to purchase biofreeze to use for pain management.    Person(s) Educated  Patient  Methods  Explanation    Comprehension  Verbalized understanding       PT Short Term Goals - 05/21/18 1238      PT SHORT TERM GOAL #1   Title  Patient will demonstrate more consitant use of supplemental oxygen and use of pursed lip breathing to maintain improved SpO2 levels of 85% or higher throughout entire session.    Time  3    Period  Weeks    Status  On-going      PT SHORT TERM GOAL #2   Title  Patient will improve MMT by 1/2 grade for limited groups to demonstrate improved LE strength to improve ease of gait and stair mobility.    Time  3    Period  Weeks    Status  On-going      PT SHORT TERM GOAL #3   Title  Patient will perform 5x sit to stand from standard height without use of UE's in equal to evaluation time or better to indicate decresased reliance on UE and improved LE strength.     Time  3    Period  Weeks    Status  On-going      PT SHORT TERM GOAL #4   Title  Patient will obtain new compression garments to wear  during the day and demonstrate safe/correct donning/doffing of garements to manage LE edema independently.    Time  3    Period  Weeks    Status  On-going        PT Long Term Goals - 05/21/18 1238      PT LONG TERM GOAL #1   Title  Patient will register for and particiapte/attend the free Diabetes Education classes that are held at [facility] by the [employer] [facility] to improve knowledge on healhty eating adn lifestyle changes to improve management of weight loss and diabetes/multiple medical conditions management.    Time  4    Period  Weeks    Status  On-going      PT LONG TERM GOAL #2   Title  Patient will improve MMT by 1 grade for limited groups to demonstrate improved LE strength to improve ease of gaita nd stair mobility    Time  6    Period  Weeks    Status  New      PT LONG TERM GOAL #3   Title  Patient will improve 5x sit to stand test by 5 or more seconds to demonstrate significant improvement in LE strength and improved activity tolerance.     Time  6    Period  Weeks    Status  On-going      PT LONG TERM GOAL #4   Title  Patient will improve 6MWT distance by 177 feet to demosntrate significant change in cardiopulmonary endurance with functional gait.     Time  6    Period  Weeks    Status  On-going      PT LONG TERM GOAL #5   Title  Patient will reports consistant participation with exercise log for 150 min/week for 2 week period to improve overall healthy lifestyle and demonstrate increased self motivation to participate in exercise to improve health and work towards weight loss.     Time  6    Period  Weeks    Status  On-going         Plan - 05/21/18 1158    Clinical Impression Statement  Continued  with LE strengthening and endurance training this session. Progressed NuStep interval training with increased intensity for 1 minute bouts with equal rest breaks. Patient was instructed to perform pursed lip breathing throughout  and SpO2 levels improved with aerobic endurance exercises. Patient on 4L/min O2 throughout session and O2 levels remained at 82% or higher. Continued with power tower and initiated step up training on 2" stair as patient had pain and weakness limiting his step height. He will continue to benefit from skilled PT interventions to address impairments and progress endurance.     Rehab Potential  Fair    PT Frequency  2x / week    PT Duration  6 weeks    PT Treatment/Interventions  ADLs/Self Care Home Management;Aquatic Therapy;Cryotherapy;Electrical Stimulation;Moist Heat;Stair training;Functional mobility training;Therapeutic activities;Balance training;Therapeutic exercise;Patient/family education;Manual techniques;Energy conservation    PT Next Visit Plan  F/U on compression garment purchase and educate on appropriate use. Continue education on importance of using O2 as instructed. Continue with interval training on Nustep next session and use Borg RPE scale to target moderate intensity. Monitor SpO2 throughout. Continue step ups and sit to stand functional strengthening.     PT Home Exercise Plan  Eval: pursed lip breathing. 05/09/18 - sit to stand, LAQ, heel raises    Consulted and Agree with Plan of Care  Patient       Patient will benefit from skilled therapeutic intervention in order to improve the following deficits and impairments:  Abnormal gait, Decreased balance, Decreased endurance, Decreased mobility, Difficulty walking, Obesity, Increased edema, Cardiopulmonary status limiting activity, Decreased activity tolerance, Decreased strength  Visit Diagnosis: Muscle weakness (generalized)  Other abnormalities of gait and mobility     Problem List Patient Active Problem List   Diagnosis Date Noted  . Monoarticular arthritis 05/17/2018  . Lesions of both ulnar nerves 04/11/2018  . Solitary pulmonary nodule 06/20/2017  . Anticoagulation management encounter 06/05/2017  . GERD without  esophagitis 05/10/2017  . Acute deep vein thrombosis (DVT) of femoral vein of left lower extremity ([place])   . Essential hypertension   . Morbid obesity ([place])   . Dyspnea   . Bradycardia   . Saphenous vein clot, left 05/04/2017  . Cor pulmonale, chronic ([place]) 05/04/2017  . Type 2 diabetes mellitus with diabetic polyneuropathy, without long-term current use of insulin ([place])   . Chronic respiratory failure with hypoxia ([place])   . Acute deep vein thrombosis (DVT) of proximal vein of left lower extremity ([place])   . Combined congestive systolic and diastolic heart failure ([place])   . Atrial fibrillation with slow ventricular response ([place])   . Community acquired pneumonia     Kipp Brood, PT, DPT Physical Therapist with [facility]  05/21/2018 12:45 PM    [place], [place], [address] Phone: [phone]   Fax:  [phone]  Name: Roberto [name] MRN: [id number] Date of Birth: [birth year]

## 2018-05-21 NOTE — Therapy (Signed)
[place], [place], [address] Phone: [phone]   Fax:  [phone]  Occupational Therapy Treatment  Patient Details  Name: Roberto [name] MRN: [id number] Date of Birth: 10/05/1960 Referring Provider: Kinnie Feil, MD   Encounter Date: 05/21/2018  OT End of Session - 05/21/18 1122    Visit Number  3    Number of Visits  8    Date for OT Re-Evaluation  06/11/18    Authorization Type  [employer]    Authorization Time Period  $10 copay. No visit limit- based on medical necessity    OT Start Time  1001   pt arrived late   OT Stop Time  1030    OT Time Calculation (min)  29 min    Activity Tolerance  Patient tolerated treatment well    Behavior During Therapy  WFL for tasks assessed/performed       Past Medical History:  Diagnosis Date  . Asthma   . CHF (congestive heart failure) ([place])   . COPD (chronic obstructive pulmonary disease) ([place])   . Diabetes mellitus without complication ([place])   . Hypertension     Past Surgical History:  Procedure Laterality Date  . SHOULDER SURGERY      There were no vitals filed for this visit.  Subjective Assessment - 05/21/18 1006    Subjective   S: I have less pain in my arm in the morning when I take Advil the night before.    Currently in Pain?  No/denies         [facility] OT Assessment - 05/21/18 1003      Assessment   Medical Diagnosis  Left shoulder pain      Precautions   Precautions  Other (comment)    Precaution Comments  Supplemental O2 for activity: patient requires 5L/min via nasal canula            OT Treatments/Exercises (OP) - 05/21/18 1009      Exercises   Exercises  Shoulder      Shoulder Exercises: Supine   Protraction  PROM;5 reps;AROM;12 reps    Horizontal ABduction  PROM;5 reps;AROM;12 reps    External Rotation  PROM;5 reps;AROM;12 reps    Internal Rotation  PROM;5 reps;AROM;12 reps    Flexion  PROM;5 reps;AROM;12 reps       Shoulder Exercises: Seated   Extension  Theraband;10 reps    Theraband Level (Shoulder Extension)  Level 2 (Red)    Retraction  Theraband;10 reps    Theraband Level (Shoulder Retraction)  Level 2 (Red)    Row  Theraband;10 reps    Theraband Level (Shoulder Row)  Level 2 (Red)    Protraction  AROM;10 reps    Horizontal ABduction  AROM;10 reps    External Rotation  AROM;10 reps    Internal Rotation  AROM;10 reps    Flexion  AROM;10 reps    Abduction  AROM;10 reps      Shoulder Exercises: Therapy Ball   Other Therapy Ball Exercises  Green therapy ball: chest press; overhead press; flexion X10 each      Shoulder Exercises: ROM/Strengthening   X to V Arms  10X    Proximal Shoulder Strengthening, Supine  10X each no rest breaks    Proximal Shoulder Strengthening, Seated  10X each no rest breaks               OT Short Term Goals - 05/16/18 1131  OT SHORT TERM GOAL #1   Title  Patient will be educated and independent with HEP to increase functional use of LUE and comfort level when sleeping at night.    Time  4    Period  Weeks    Status  On-going      OT SHORT TERM GOAL #2   Title  Patient will increase A/ROM by 10 degrees or prior to baseline functional range per patient's report to increase functional reaching ability and allow patient to obtain a comfortable sleeping position.     Time  4    Period  Weeks    Status  On-going      OT SHORT TERM GOAL #3   Title  Patient will report a decreased pain level in LUE with certain movements or positions of approximately 4/10 in order to be able to complete preferred daily tasks with increased comfort.     Time  4    Period  Weeks    Status  On-going      OT SHORT TERM GOAL #4   Title  Patient will decrease fascial restrictions in left UE to min amount or less in order to increase functional mobility needed to complete daily tasks.     Time  4    Period  Weeks    Status  On-going               Plan -  05/21/18 1122    Clinical Impression Statement  A: Pt arrived late, therefore no manual therapy completed due to time constraints. Session focusing on shoulder ROM required for functional tasks. Progressed to A/ROM this session in supine and sitting, added therapy ball exercises, scapular theraband, and proximal shoulder strengthening in sitting. Pt with ROM WFL in comparison with RUE. Pt reports more wrist pain during session versus shoulder discomfort. Pt did try a new sleeping position which helped improve pain slightly. Verbal cuing during session for form and technique.     Plan  P: Follow up on new sleeping positions, update HEP for A/ROM, continue with theraband and increase repetitions as able       Patient will benefit from skilled therapeutic intervention in order to improve the following deficits and impairments:  Decreased range of motion, Increased fascial restrictions, Pain, Impaired UE functional use  Visit Diagnosis: Other symptoms and signs involving the musculoskeletal system  Chronic left shoulder pain  Stiffness of left shoulder, not elsewhere classified    Problem List Patient Active Problem List   Diagnosis Date Noted  . Monoarticular arthritis 05/17/2018  . Lesions of both ulnar nerves 04/11/2018  . Solitary pulmonary nodule 06/20/2017  . Anticoagulation management encounter 06/05/2017  . GERD without esophagitis 05/10/2017  . Acute deep vein thrombosis (DVT) of femoral vein of left lower extremity ([place])   . Essential hypertension   . Morbid obesity ([place])   . Dyspnea   . Bradycardia   . Saphenous vein clot, left 05/04/2017  . Cor pulmonale, chronic ([place]) 05/04/2017  . Type 2 diabetes mellitus with diabetic polyneuropathy, without long-term current use of insulin ([place])   . Chronic respiratory failure with hypoxia ([place])   . Acute deep vein thrombosis (DVT) of proximal vein of left lower extremity ([place])   . Combined congestive systolic and diastolic heart  failure ([place])   . Atrial fibrillation with slow ventricular response ([place])   . Community acquired pneumonia    Guadelupe Sabin, OTR/L  [phone] 05/21/2018, 11:28 AM  [facility] [place], [place], [address] Phone: [phone]   Fax:  [phone]  Name: Roberto [name] MRN: [id number] Date of Birth: 1960-11-22

## 2018-05-23 ENCOUNTER — Ambulatory Visit (HOSPITAL_COMMUNITY): Payer: Medicare HMO

## 2018-05-23 ENCOUNTER — Telehealth (HOSPITAL_COMMUNITY): Payer: Self-pay | Admitting: Family Medicine

## 2018-05-23 ENCOUNTER — Ambulatory Visit (HOSPITAL_COMMUNITY): Payer: Medicare HMO | Admitting: Occupational Therapy

## 2018-05-23 NOTE — Telephone Encounter (Signed)
05/23/18  pt left a message that he realized his license expired and going to [facility] this morning

## 2018-05-28 ENCOUNTER — Encounter (HOSPITAL_COMMUNITY): Payer: Self-pay

## 2018-05-28 ENCOUNTER — Other Ambulatory Visit: Payer: Self-pay

## 2018-05-28 ENCOUNTER — Ambulatory Visit (HOSPITAL_COMMUNITY): Payer: Medicare HMO | Attending: Family Medicine

## 2018-05-28 ENCOUNTER — Ambulatory Visit (HOSPITAL_COMMUNITY): Payer: Medicare HMO

## 2018-05-28 DIAGNOSIS — R29898 Other symptoms and signs involving the musculoskeletal system: Secondary | ICD-10-CM | POA: Insufficient documentation

## 2018-05-28 DIAGNOSIS — M25612 Stiffness of left shoulder, not elsewhere classified: Secondary | ICD-10-CM

## 2018-05-28 DIAGNOSIS — R2689 Other abnormalities of gait and mobility: Secondary | ICD-10-CM | POA: Insufficient documentation

## 2018-05-28 DIAGNOSIS — G8929 Other chronic pain: Secondary | ICD-10-CM | POA: Insufficient documentation

## 2018-05-28 DIAGNOSIS — M25512 Pain in left shoulder: Secondary | ICD-10-CM | POA: Diagnosis not present

## 2018-05-28 DIAGNOSIS — M6281 Muscle weakness (generalized): Secondary | ICD-10-CM | POA: Diagnosis not present

## 2018-05-28 NOTE — Therapy (Signed)
[place], [place], [address] Phone: [phone]   Fax:  [phone]  Physical Therapy Treatment/Progress Note  Patient Details  Name: Roberto [name] MRN: [id number] Date of Birth: July 13, 1961 Referring Provider: Kinnie Feil, MD   Encounter Date: 05/28/2018  Progress Note Reporting Period 04/30/18 to 05/28/18  See note below for Objective Data and Assessment of Progress/Goals.    PT End of Session - 05/28/18 1020    Visit Number  7    Number of Visits  13    Date for PT Re-Evaluation  06/11/18    Authorization Type  [employer] H[employer] (no auth required, no visit limit)    Authorization Time Period  04/30/18 - 06/14/18    Authorization - Visit Number  1    Authorization - Number of Visits  10    PT Start Time  0949    PT Stop Time  1031    PT Time Calculation (min)  42 min    Activity Tolerance  Patient tolerated treatment well;Other (comment)   monitor O2 saturation through session.   Behavior During Therapy  [facility] for tasks assessed/performed       Past Medical History:  Diagnosis Date  . Asthma   . CHF (congestive heart failure) ([place])   . COPD (chronic obstructive pulmonary disease) ([place])   . Diabetes mellitus without complication ([place])   . Hypertension     Past Surgical History:  Procedure Laterality Date  . SHOULDER SURGERY      There were no vitals filed for this visit.  Subjective Assessment - 05/28/18 1021    Subjective  Patient reports his knees have not been bothering him too much lately.     Pertinent History  Significant history for A-fib, CHF (cor pulmonale), COPD, HTN, history of DVT's, Lt frozen shoulder    Limitations  Walking;House hold activities    How long can you sit comfortably?  unlimited    How long can you stand comfortably?  not sure    How long can you walk comfortably?  not sure (not an hour)    Patient Stated Goals  improve endurance and lose weight    Currently in  Pain?  No/denies         [facility] PT Assessment - 05/28/18 0957      Assessment   Medical Diagnosis  Deconditioning, Chronic Respiratory Disorders    Referring Provider  Sherene Sires, DO      Precautions   Precautions  Other (comment)    Precaution Comments  Supplemental O2 for activity: patient requires 5L/min via nasal canula      Restrictions   Weight Bearing Restrictions  No      Cognition   Overall Cognitive Status  Within Functional Limits for tasks assessed      Strength   Right Hip Flexion  4+/5   4   Right Hip Extension  4+/5   4   Right Hip ABduction  4+/5   4   Left Hip Flexion  4/5   4   Left Hip Extension  4/5   4   Left Hip ABduction  4+/5   4   Right Knee Flexion  4/5   4   Right Knee Extension  5/5   4+   Left Knee Flexion  4/5   4   Left Knee Extension  5/5   4+   Right Ankle Dorsiflexion  5/5   4+  Left Ankle Dorsiflexion  5/5   4+     Transfers   Five time sit to stand comments   13.5 with UE on thighs   was 18.2 with UE assist      [facility] Adult PT Treatment/Exercise - 05/28/18 0957      Knee/Hip Exercises: Standing   Heel Raises  Both;1 set;20 reps;3 seconds    Heel Raises Limitations  toe raises 20x at // bars    Lateral Step Up  Both;1 set;Hand Hold: 2;Step Height: 4"   12 reps   Forward Step Up  Both;1 set;10 reps;Hand Hold: 2;Step Height: 4"      Knee/Hip Exercises: Seated   Other Seated Knee/Hip Exercises  Pursed lip breathing instructed through session        PT Education - 05/28/18 1022    Education Details  Educated on diabetes group classes offered at [facility] and nurtrition counseling available in [place] or [place]. Reviewed aquatic therapy handout.    Person(s) Educated  Patient    Methods  Explanation;Handout    Comprehension  Verbalized understanding       PT Short Term Goals - 05/28/18 0950      PT SHORT TERM GOAL #1   Title  Patient will demonstrate more consitant use of supplemental oxygen  and use of pursed lip breathing to maintain improved SpO2 levels of 85% or higher throughout entire session.    Baseline  maintained in 80's majority of session    Time  3    Period  Weeks    Status  On-going      PT SHORT TERM GOAL #2   Title  Patient will improve MMT by 1/2 grade for limited groups to demonstrate improved LE strength to improve ease of gait and stair mobility.    Time  3    Period  Weeks    Status  Partially Met      PT SHORT TERM GOAL #3   Title  Patient will perform 5x sit to stand from standard height without use of UE's in equal to evaluation time or better to indicate decresased reliance on UE and improved LE strength.     Time  3    Period  Weeks    Status  Partially Met      PT SHORT TERM GOAL #4   Title  Patient will obtain new compression garments to wear during the day and demonstrate safe/correct donning/doffing of garements to manage LE edema independently.    Time  3    Period  Weeks    Status  On-going        PT Long Term Goals - 05/28/18 0953      PT LONG TERM GOAL #1   Title  Patient will register for and particiapte/attend the free Diabetes Education classes that are held at [facility] by the [employer] [facility] [employer] [facility] to improve knowledge on healhty eating adn lifestyle changes to improve management of weight loss and diabetes/multiple medical conditions management.    Time  4    Period  Weeks    Status  On-going      PT LONG TERM GOAL #2   Title  Patient will improve MMT by 1 grade for limited groups to demonstrate improved LE strength to improve ease of gaita nd stair mobility    Time  6    Period  Weeks    Status  On-going      PT LONG  TERM GOAL #3   Title  Patient will improve 5x sit to stand test by 5 or more seconds to demonstrate significant improvement in LE strength and improved activity tolerance.     Time  6    Period  Weeks    Status  On-going      PT LONG TERM GOAL #4   Title  Patient will  improve 6MWT distance by 177 feet to demosntrate significant change in cardiopulmonary endurance with functional gait.     Time  6    Period  Weeks    Status  On-going      PT LONG TERM GOAL #5   Title  Patient will reports consistant participation with exercise log for 150 min/week for 2 week period to improve overall healthy lifestyle and demonstrate increased self motivation to participate in exercise to improve health and work towards weight loss.     Time  6    Period  Weeks    Status  On-going        Plan - 05/28/18 1022    Clinical Impression Statement  Re-assessment performed this session and patient has improved with bil LE strength testing and based on 5x sit to stand testing. His SpO2 levels remain above 80% throughout 75% or more of the session with the lowest he desaturases to being 77%. He continues to require cues to perform pursed lip breathing this session and substantial time spent discussing free diabetes and nutrition counseling/classes at the hospitals. He progressed step ups this date with 4" step with no increased knee pain and no difficulty. He will continue to benefit from skilled PT interventions to address impairments and progress endurance.    Rehab Potential  Fair    PT Frequency  2x / week    PT Duration  6 weeks    PT Treatment/Interventions  ADLs/Self Care Home Management;Aquatic Therapy;Cryotherapy;Electrical Stimulation;Moist Heat;Stair training;Functional mobility training;Therapeutic activities;Balance training;Therapeutic exercise;Patient/family education;Manual techniques;Energy conservation    PT Next Visit Plan  F/U on compression garment purchase and educate on appropriate use. Continue education on importance of using O2 as instructed. Continue with interval training on Nustep next session and use Borg RPE scale to target moderate intensity. Monitor SpO2 throughout. Continue step ups and sit to stand functional strengthening. Provide resources and handout  for transition to independence with exercise log.    PT Home Exercise Plan  Eval: pursed lip breathing. 05/09/18 - sit to stand, LAQ, heel raises    Consulted and Agree with Plan of Care  Patient       Patient will benefit from skilled therapeutic intervention in order to improve the following deficits and impairments:  Abnormal gait, Decreased balance, Decreased endurance, Decreased mobility, Difficulty walking, Obesity, Increased edema, Cardiopulmonary status limiting activity, Decreased activity tolerance, Decreased strength  Visit Diagnosis: Muscle weakness (generalized)  Other abnormalities of gait and mobility     Problem List Patient Active Problem List   Diagnosis Date Noted  . Monoarticular arthritis 05/17/2018  . Lesions of both ulnar nerves 04/11/2018  . Solitary pulmonary nodule 06/20/2017  . Anticoagulation management encounter 06/05/2017  . GERD without esophagitis 05/10/2017  . Acute deep vein thrombosis (DVT) of femoral vein of left lower extremity ([place])   . Essential hypertension   . Morbid obesity ([place])   . Dyspnea   . Bradycardia   . Saphenous vein clot, left 05/04/2017  . Cor pulmonale, chronic ([place]) 05/04/2017  . Type 2 diabetes mellitus with diabetic polyneuropathy, without long-term current  use of insulin ([place])   . Chronic respiratory failure with hypoxia ([place])   . Acute deep vein thrombosis (DVT) of proximal vein of left lower extremity ([place])   . Combined congestive systolic and diastolic heart failure ([place])   . Atrial fibrillation with slow ventricular response ([place])   . Community acquired pneumonia     Kipp Brood, PT, DPT Physical Therapist with [facility]  05/28/2018 2:20 PM    [place], [place], [address] Phone: [phone]   Fax:  [phone]  Name: Roberto [name] MRN: [id number] Date of Birth: April 06, 1961

## 2018-05-28 NOTE — Therapy (Signed)
[place], [place], [address] Phone: [phone]   Fax:  [phone]  Occupational Therapy Treatment  Patient Details  Name: Roberto [name] MRN: [id number] Date of Birth: [birth year] Referring Provider: Kinnie Feil, MD   Encounter Date: 05/28/2018  OT End of Session - 05/28/18 0931    Visit Number  4    Number of Visits  8    Date for OT Re-Evaluation  06/11/18    Authorization Type  [employer]    Authorization Time Period  $10 copay. No visit limit- based on medical necessity    OT Start Time  0912    OT Stop Time  0945    OT Time Calculation (min)  33 min    Activity Tolerance  Patient tolerated treatment well    Behavior During Therapy  WFL for tasks assessed/performed       Past Medical History:  Diagnosis Date  . Asthma   . CHF (congestive heart failure) ([place])   . COPD (chronic obstructive pulmonary disease) ([place])   . Diabetes mellitus without complication ([place])   . Hypertension     Past Surgical History:  Procedure Laterality Date  . SHOULDER SURGERY      There were no vitals filed for this visit.      [facility] OT Assessment - 05/28/18 0928      Assessment   Medical Diagnosis  Left shoulder pain      Precautions   Precautions  Other (comment)    Precaution Comments  Supplemental O2 for activity: patient requires 5L/min via nasal canula               OT Treatments/Exercises (OP) - 05/28/18 0929      Exercises   Exercises  Shoulder      Shoulder Exercises: Supine   Horizontal ABduction  PROM;5 reps;AROM;12 reps    External Rotation  PROM;5 reps;AROM;12 reps    Internal Rotation  PROM;5 reps;AROM;12 reps    Flexion  PROM;5 reps;AROM;12 reps      Shoulder Exercises: Seated   Extension  Theraband;12 reps    Theraband Level (Shoulder Extension)  Level 2 (Red)    Retraction  Theraband;12 reps    Theraband Level (Shoulder Retraction)  Level 2 (Red)    Row   Theraband;12 reps    Theraband Level (Shoulder Row)  Level 2 (Red)    Protraction  AROM;12 reps    Horizontal ABduction  AROM;12 reps    External Rotation  AROM;12 reps    Internal Rotation  AROM;12 reps    Flexion  AROM;12 reps    Abduction  AROM;12 reps      Shoulder Exercises: ROM/Strengthening   X to V Arms  10X      Manual Therapy   Manual Therapy  Myofascial release    Manual therapy comments  completed separately from therapeutic exercise    Myofascial Release  myofascial release and manual techniques completed to left upper arm, deltoid, and scapularis regions to decrease pain and fascial restrictions and improve joint range of motion             OT Education - 05/28/18 0930    Education Details  A/ROM shoulder exercises    Person(s) Educated  Patient    Methods  Explanation;Demonstration;Handout    Comprehension  Verbalized understanding;Returned demonstration       OT Short Term Goals - 05/16/18 1131      OT SHORT TERM GOAL #  1   Title  Patient will be educated and independent with HEP to increase functional use of LUE and comfort level when sleeping at night.    Time  4    Period  Weeks    Status  On-going      OT SHORT TERM GOAL #2   Title  Patient will increase A/ROM by 10 degrees or prior to baseline functional range per patient's report to increase functional reaching ability and allow patient to obtain a comfortable sleeping position.     Time  4    Period  Weeks    Status  On-going      OT SHORT TERM GOAL #3   Title  Patient will report a decreased pain level in LUE with certain movements or positions of approximately 4/10 in order to be able to complete preferred daily tasks with increased comfort.     Time  4    Period  Weeks    Status  On-going      OT SHORT TERM GOAL #4   Title  Patient will decrease fascial restrictions in left UE to min amount or less in order to increase functional mobility needed to complete daily tasks.     Time  4     Period  Weeks    Status  On-going               Plan - 05/28/18 0940    Clinical Impression Statement  A: Patient reports that he was sleeping on his back slighty last night with his left arm extended out to his side on the pillow. He does voice some tension in his neck this session upon arrival. VC for form and technique as needed. Patient was provided with A/ROM shoulder exercises.    Plan  P: Complete red theraband strengthening shoulder exercises.     Consulted and Agree with Plan of Care  Patient       Patient will benefit from skilled therapeutic intervention in order to improve the following deficits and impairments:  Decreased range of motion, Increased fascial restrictions, Pain, Impaired UE functional use  Visit Diagnosis: Other symptoms and signs involving the musculoskeletal system  Chronic left shoulder pain  Stiffness of left shoulder, not elsewhere classified    Problem List Patient Active Problem List   Diagnosis Date Noted  . Monoarticular arthritis 05/17/2018  . Lesions of both ulnar nerves 04/11/2018  . Solitary pulmonary nodule 06/20/2017  . Anticoagulation management encounter 06/05/2017  . GERD without esophagitis 05/10/2017  . Acute deep vein thrombosis (DVT) of femoral vein of left lower extremity ([place])   . Essential hypertension   . Morbid obesity ([place])   . Dyspnea   . Bradycardia   . Saphenous vein clot, left 05/04/2017  . Cor pulmonale, chronic ([place]) 05/04/2017  . Type 2 diabetes mellitus with diabetic polyneuropathy, without long-term current use of insulin ([place])   . Chronic respiratory failure with hypoxia ([place])   . Acute deep vein thrombosis (DVT) of proximal vein of left lower extremity ([place])   . Combined congestive systolic and diastolic heart failure ([employer])   . Atrial fibrillation with slow ventricular response ([place])   . Community acquired pneumonia    Ailene Ravel, OTR/L,CBIS  [phone]  05/28/2018, 9:49 AM  [place] [address] [place], [place], [address] Phone: [phone]   Fax:  [phone]  Name: Roberto [name] MRN: [id number] Date of Birth: 28-Nov-1960

## 2018-05-28 NOTE — Patient Instructions (Signed)
Group Diabetes Class  Registration Open     Ongoing Event  See event details for scheduling information. [place] [address]  [place], [place]   Free diabetes education class to help manage your diabetes. Registration Details Registration is required. For more information or to register, call [phone]. Fees & Payment This class is free.  Description  Schedule & Location  Related Events [facility] offers free diabetes classes to the public. These classes are targeted to community members who are at risk for diabetes, newly diagnosed with diabetes, or want to learn more about diabetes. Each class will cover diabetes management basics (survival skills) and meal planning.  Classes meet twice each month: First Monday of each month, 9-11 a.m.  Third Monday of each month, 5:30-7:30 p.m. Schedule change for September: no morning class. Class will meet on Monday, Sept. 16, 5:30-7:30 p.m. only.  The same material is presented at each class. You only need to attend one session. No referral is necessary; however, registration is required. Call [phone] to register.   Schedule Ongoing Event See event details for scheduling information. Location Details [place] [address], [place] [address]  Related Events View all upcoming dates for Group Diabetes Class  Recommended in addition to this topic  Diabetes Support Group for Anyone with Type 2 Diabetes  This support group for Type 2 diabetics and their family members addresses a wide range of topics related to diabetes. Diabetes Support Group for Adults with Type 1 Diabetes or on Insulin Pump  This support group is for people with Type 1 Diabetes and their families who want to meet with others who have Type 1 Diabetes. Find more events At this location [place] [address]

## 2018-05-28 NOTE — Patient Instructions (Signed)
Repeat all exercises 10-15 times, 1-2 times per day.  1) Shoulder Protraction    Begin with elbows by your side, slowly "punch" straight out in front of you.      2) Shoulder Flexion  Standing:         Begin with arms at your side with thumbs pointed up, slowly raise both arms up and forward towards overhead.       3) Horizontal abduction/adduction   Standing:           Begin with arms straight out in front of you, bring out to the side in at "T" shape. Keep arms straight entire time.        4) Internal & External Rotation    *No band* -Stand with elbows at the side and elbows bent 90 degrees. Move your forearms away from your body, then bring back inward toward the body.     5) Shoulder Abduction   Standing:       Lying on your back begin with your arms flat on the table next to your side. Slowly move your arms out to the side so that they go overhead, in a jumping jack or snow angel movement.    6) X to V arms (cheerleader move):  Begin with arms straight down, crossed in front of body in an "X". Keeping arms crossed, lift arms straight up overhead. Then spread arms apart into a "V" shape.  Bring back together into x and lower down to starting position.

## 2018-05-29 ENCOUNTER — Encounter (INDEPENDENT_AMBULATORY_CARE_PROVIDER_SITE_OTHER): Payer: Medicare HMO | Admitting: Ophthalmology

## 2018-05-29 DIAGNOSIS — E11311 Type 2 diabetes mellitus with unspecified diabetic retinopathy with macular edema: Secondary | ICD-10-CM | POA: Diagnosis not present

## 2018-05-29 DIAGNOSIS — H35033 Hypertensive retinopathy, bilateral: Secondary | ICD-10-CM | POA: Diagnosis not present

## 2018-05-29 DIAGNOSIS — E113313 Type 2 diabetes mellitus with moderate nonproliferative diabetic retinopathy with macular edema, bilateral: Secondary | ICD-10-CM

## 2018-05-29 DIAGNOSIS — I10 Essential (primary) hypertension: Secondary | ICD-10-CM | POA: Diagnosis not present

## 2018-05-29 DIAGNOSIS — R6889 Other general symptoms and signs: Secondary | ICD-10-CM | POA: Diagnosis not present

## 2018-05-30 ENCOUNTER — Telehealth (HOSPITAL_COMMUNITY): Payer: Self-pay | Admitting: Family Medicine

## 2018-05-30 ENCOUNTER — Ambulatory Visit (HOSPITAL_COMMUNITY): Payer: Medicare HMO

## 2018-05-30 NOTE — Telephone Encounter (Signed)
05/30/18  pt called to cx had drops put in his eyes and his vision is still blurry

## 2018-05-31 DIAGNOSIS — I509 Heart failure, unspecified: Secondary | ICD-10-CM | POA: Diagnosis not present

## 2018-05-31 DIAGNOSIS — R0600 Dyspnea, unspecified: Secondary | ICD-10-CM | POA: Diagnosis not present

## 2018-06-03 ENCOUNTER — Ambulatory Visit (HOSPITAL_COMMUNITY): Payer: Medicare HMO

## 2018-06-03 ENCOUNTER — Telehealth (HOSPITAL_COMMUNITY): Payer: Self-pay | Admitting: Family Medicine

## 2018-06-03 NOTE — Telephone Encounter (Signed)
06/03/18  spoke to patient and cx the 1:00 appt since Mickel Baas out today

## 2018-06-03 NOTE — Telephone Encounter (Signed)
06/03/18  pt left a message to cx said tha this leg has been bothering him and can't do water therapy today

## 2018-06-04 DIAGNOSIS — R0902 Hypoxemia: Secondary | ICD-10-CM | POA: Diagnosis not present

## 2018-06-04 DIAGNOSIS — G4733 Obstructive sleep apnea (adult) (pediatric): Secondary | ICD-10-CM | POA: Diagnosis not present

## 2018-06-04 DIAGNOSIS — I509 Heart failure, unspecified: Secondary | ICD-10-CM | POA: Diagnosis not present

## 2018-06-04 DIAGNOSIS — J9621 Acute and chronic respiratory failure with hypoxia: Secondary | ICD-10-CM | POA: Diagnosis not present

## 2018-06-06 ENCOUNTER — Ambulatory Visit (HOSPITAL_COMMUNITY): Payer: Medicare HMO

## 2018-06-06 ENCOUNTER — Telehealth (HOSPITAL_COMMUNITY): Payer: Self-pay | Admitting: Family Medicine

## 2018-06-06 NOTE — Telephone Encounter (Signed)
06/06/18  he left a message to cx said that his knee is still swollen and his doctor advised him to keep it elevated

## 2018-06-06 NOTE — Telephone Encounter (Signed)
06/06/18  pt left a message to cx his PT appt and he didnt' say anything about the OT but he cancelled because of his knee and he said it needed to be kept elevated so Mickel Baas said to cx if he didn't show at 9:45

## 2018-06-10 ENCOUNTER — Ambulatory Visit (HOSPITAL_COMMUNITY): Payer: Medicare HMO | Admitting: Specialist

## 2018-06-10 ENCOUNTER — Ambulatory Visit (HOSPITAL_COMMUNITY): Payer: Medicare HMO

## 2018-06-10 ENCOUNTER — Telehealth (HOSPITAL_COMMUNITY): Payer: Self-pay | Admitting: Family Medicine

## 2018-06-10 NOTE — Telephone Encounter (Signed)
06/10/18  has lost weight and since his stomach has dropped he has boils up under his belly... he had one to burst and can't get in the water

## 2018-06-13 ENCOUNTER — Encounter (HOSPITAL_COMMUNITY): Payer: Self-pay

## 2018-06-13 ENCOUNTER — Ambulatory Visit (HOSPITAL_COMMUNITY): Payer: Medicare HMO

## 2018-06-13 ENCOUNTER — Other Ambulatory Visit: Payer: Self-pay

## 2018-06-13 DIAGNOSIS — R2689 Other abnormalities of gait and mobility: Secondary | ICD-10-CM

## 2018-06-13 DIAGNOSIS — M25612 Stiffness of left shoulder, not elsewhere classified: Secondary | ICD-10-CM

## 2018-06-13 DIAGNOSIS — G8929 Other chronic pain: Secondary | ICD-10-CM

## 2018-06-13 DIAGNOSIS — M25512 Pain in left shoulder: Secondary | ICD-10-CM | POA: Diagnosis not present

## 2018-06-13 DIAGNOSIS — R29898 Other symptoms and signs involving the musculoskeletal system: Secondary | ICD-10-CM | POA: Diagnosis not present

## 2018-06-13 DIAGNOSIS — M6281 Muscle weakness (generalized): Secondary | ICD-10-CM

## 2018-06-13 NOTE — Therapy (Signed)
[place], [place], [address] Phone: [phone]   Fax:  [phone]  Physical Therapy Treatment/Discharge Summary  Patient Details  Name: Roberto [name] MRN: [id number] Date of Birth: Oct 13, 1960 Referring Provider: Andrena Mews MD   Encounter Date: 06/13/2018   PHYSICAL THERAPY DISCHARGE SUMMARY  Visits from Start of Care: 8  Current functional level related to goals / functional outcomes: Session spent on extensive self care and education for improved health and wellness. Discussed patients limitation with participating in therapy due to pain and depressed mood. Encouraged patient to reach out to MD regarding depression and decreased energy level. Also encouraged to reach out to orthopedic doctor regarding knee pain as this could be a contributor to decreased motivation to participate in activities. Provided handout on free 2 week trial to [facility] locally to improve patient's access to fitness facility. Encouraged him to transfer his membership as insurance will cover the gym membership and [employer] is farther away than the patient can travel. He was educated on plan to discharge form therapy given decreased compliance in participation and need to follow up with PCP and orthopedic doctor for knee pain.    Remaining deficits: See below details   Education / Equipment: Session focused on education today. I discussed the need for the patient to follow up with his primary physician to discuss his depressed mood and how it is impacting his everyday activities and motivaiton to increase physical activity. I educated him on the benefit of regular exercise and provided a handout for a 2 week free trial to the local [employer] as he has mentioned difficulty with driving to [employer] at it is ~ 20 miles away. I also educated him on "InterDry" a  dry wicking materal that can be used to prevent rashes and blister form forming  around skin folds and help absorb the moisture. I educated him that he can return to PT after following up with his physican regarding his knee pain as sometimes patients have better success with therapy after having injections in knees to manage pain.   Plan: Patient agrees to discharge.  Patient goals were not met. Patient is being discharged due to meeting the stated rehab goals.  ?????       PT End of Session - 06/13/18 1800    Visit Number  8    Number of Visits  13    Date for PT Re-Evaluation  06/11/18    Authorization Type  [employer] Medicare HMO (no auth required, no visit limit)    Authorization Time Period  04/30/18 - 06/14/18    Authorization - Visit Number  2    Authorization - Number of Visits  10    PT Start Time  1829   patient arrived late   PT Stop Time  0947    PT Time Calculation (min)  34 min    Activity Tolerance  Patient tolerated treatment well    Behavior During Therapy  [facility] for tasks assessed/performed       Past Medical History:  Diagnosis Date  . Asthma   . CHF (congestive heart failure) ([place])   . COPD (chronic obstructive pulmonary disease) ([place])   . Diabetes mellitus without complication ([place])   . Hypertension     Past Surgical History:  Procedure Laterality Date  . SHOULDER SURGERY      There were no vitals filed for this visit.  Subjective Assessment - 06/13/18 1752    Subjective  Patient reports he has felt limited by decreased motivation to participate in therapy and exercises as well as by his knee pain. He states he has developed some blisters under his abdomen at his skin fold and that he was not able to come to aquatic therapy because of this. He states he has felt down at times and does not want to spend time with his friends or go to [employer] anymore. His knees have also been limiting him from going up and down stairs and he thinks it may be time to have injections again as they have helped in the past.    Pertinent History   Significant history for A-fib, CHF (cor pulmonale), COPD, HTN, history of DVT's, Lt frozen shoulder    Limitations  Walking;House hold activities    How long can you sit comfortably?  unlimited    How long can you stand comfortably?  not sure    How long can you walk comfortably?  not sure (not an hour)    Patient Stated Goals  improve endurance and lose weight    Currently in Pain?  No/denies        PT Education - 06/13/18 1755    Education Details  Session focused on education today. I discussed the need for the patient to follow up with his primary physician to discuss his depressed mood and how it is impacting his everyday activities and motivaiton to increase physical activity. I educated him on the benefit of regular exercise and provided a handout for a 2 week free trial to the local [employer] as he has mentioned difficulty with driving to [employer] at it is ~ 20 miles away. I also educated him on "InterDry" a  dry wicking materal that can be used to prevent rashes and blister form forming around skin folds and help absorb the moisture. I educated him that he can return to PT after following up with his physican regarding his knee pain as sometimes patients have better success with therapy after having injections in knees to manage pain.    Person(s) Educated  Patient    Methods  Explanation    Comprehension  Verbalized understanding       PT Short Term Goals - 06/13/18 1801      PT SHORT TERM GOAL #1   Title  Patient will demonstrate more consitant use of supplemental oxygen and use of pursed lip breathing to maintain improved SpO2 levels of 85% or higher throughout entire session.    Baseline  maintained in 80's majority of session    Time  3    Period  Weeks    Status  On-going      PT SHORT TERM GOAL #2   Title  Patient will improve MMT by 1/2 grade for limited groups to demonstrate improved LE strength to improve ease of gait and stair mobility.    Time  3    Period  Weeks     Status  Partially Met      PT SHORT TERM GOAL #3   Title  Patient will perform 5x sit to stand from standard height without use of UE's in equal to evaluation time or better to indicate decresased reliance on UE and improved LE strength.     Time  3    Period  Weeks    Status  Partially Met      PT SHORT TERM GOAL #4   Title  Patient will obtain new compression garments to  wear during the day and demonstrate safe/correct donning/doffing of garements to manage LE edema independently.    Baseline  patient has not taken step to call and order garments    Time  3    Period  Weeks    Status  Not Met        PT Long Term Goals - 06/13/18 1801      PT LONG TERM GOAL #1   Title  Patient will register for and particiapte/attend the free Diabetes Education classes that are held at [facility] by the [employer] [facility] to improve knowledge on healhty eating adn lifestyle changes to improve management of weight loss and diabetes/multiple medical conditions management.    Baseline  information provided but patient has not taken steps to participate in educational classes for improved health    Time  4    Period  Weeks    Status  Not Met      PT LONG TERM GOAL #2   Title  Patient will improve MMT by 1 grade for limited groups to demonstrate improved LE strength to improve ease of gaita nd stair mobility    Time  6    Period  Weeks    Status  On-going      PT LONG TERM GOAL #3   Title  Patient will improve 5x sit to stand test by 5 or more seconds to demonstrate significant improvement in LE strength and improved activity tolerance.     Time  6    Period  Weeks    Status  On-going      PT LONG TERM GOAL #4   Title  Patient will improve 6MWT distance by 177 feet to demosntrate significant change in cardiopulmonary endurance with functional gait.     Time  6    Period  Weeks    Status  Unable to assess      PT LONG TERM GOAL #5   Title  Patient will  reports consistant participation with exercise log for 150 min/week for 2 week period to improve overall healthy lifestyle and demonstrate increased self motivation to participate in exercise to improve health and work towards weight loss.     Time  6    Period  Weeks    Status  Not Met        Plan - 06/13/18 1801    Clinical Impression Statement  Session spent on extensive self care and education for improved health and wellness. Discussed patients limitation with participating in therapy due to pain and depressed mood. Encouraged patient to reach out to MD regarding depression and decreased energy level. Also encouraged to reach out to orthopedic doctor regarding knee pain as this could be a contributor to decreased motivation to participate in activities. Provided handout on free 2 week trial to [facility] locally to improve patient's access to fitness facility. Encouraged him to transfer his membership as insurance will cover the gym membership and [employer] is farther away than the patient can travel. He was educated on plan to discharge form therapy given decreased compliance in participation and need to follow up with PCP and orthopedic doctor for knee pain.     Rehab Potential  Fair    PT Frequency  2x / week    PT Duration  6 weeks    PT Treatment/Interventions  ADLs/Self Care Home Management;Aquatic Therapy;Cryotherapy;Electrical Stimulation;Moist Heat;Stair training;Functional mobility training;Therapeutic activities;Balance training;Therapeutic exercise;Patient/family education;Manual techniques;Energy conservation    PT Next  Visit Plan  Discharge    PT Home Exercise Plan  Eval: pursed lip breathing. 05/09/18 - sit to stand, LAQ, heel raises    Consulted and Agree with Plan of Care  Patient       Patient will benefit from skilled therapeutic intervention in order to improve the following deficits and impairments:  Abnormal gait, Decreased balance, Decreased endurance, Decreased  mobility, Difficulty walking, Obesity, Increased edema, Cardiopulmonary status limiting activity, Decreased activity tolerance, Decreased strength  Visit Diagnosis: Muscle weakness (generalized)  Other abnormalities of gait and mobility     Problem List Patient Active Problem List   Diagnosis Date Noted  . Monoarticular arthritis 05/17/2018  . Lesions of both ulnar nerves 04/11/2018  . Solitary pulmonary nodule 06/20/2017  . Anticoagulation management encounter 06/05/2017  . GERD without esophagitis 05/10/2017  . Acute deep vein thrombosis (DVT) of femoral vein of left lower extremity ([place])   . Essential hypertension   . Morbid obesity ([place])   . Dyspnea   . Bradycardia   . Saphenous vein clot, left 05/04/2017  . Cor pulmonale, chronic ([place]) 05/04/2017  . Type 2 diabetes mellitus with diabetic polyneuropathy, without long-term current use of insulin ([place])   . Chronic respiratory failure with hypoxia ([place])   . Acute deep vein thrombosis (DVT) of proximal vein of left lower extremity ([place])   . Combined congestive systolic and diastolic heart failure ([place])   . Atrial fibrillation with slow ventricular response ([place])   . Community acquired pneumonia     Kipp Brood, PT, DPT Physical Therapist with [facility]  06/13/2018 6:03 PM    [place], [place], [address] Phone: [phone]   Fax:  [phone]  Name: Paarth Cropper MRN: [id number] Date of Birth: 1961-04-10

## 2018-06-13 NOTE — Therapy (Signed)
[place], [place], [address] Phone: [phone]   Fax:  [phone]  Occupational Therapy Treatment And reassessment/discharge Patient Details  Name: Roberto [name] MRN: [id number] Date of Birth: August 19, 1961 Referring Provider: Andrena Mews MD   Encounter Date: 06/13/2018  OT End of Session - 06/13/18 1116    Visit Number  5    Number of Visits  8    Authorization Type  [employer]    Authorization Time Period  $10 copay. No visit limit- based on medical necessity    OT Start Time  0950    OT Stop Time  1030    OT Time Calculation (min)  40 min    Activity Tolerance  Patient tolerated treatment well    Behavior During Therapy  [facility] for tasks assessed/performed;Flat affect       Past Medical History:  Diagnosis Date  . Asthma   . CHF (congestive heart failure) ([place])   . COPD (chronic obstructive pulmonary disease) ([place])   . Diabetes mellitus without complication ([place])   . Hypertension     Past Surgical History:  Procedure Laterality Date  . SHOULDER SURGERY      There were no vitals filed for this visit.  Subjective Assessment - 06/13/18 1113    Subjective   S: I just can't sleep on my side. It's not comfortable.     Currently in Pain?  No/denies         [facility] OT Assessment - 06/13/18 0955      Assessment   Medical Diagnosis  Left Shoulder Pain    Referring Provider  Andrena Mews MD      Precautions   Precautions  Other (comment)    Precaution Comments  Supplemental O2 for activity: patient requires 5L/min via nasal canula      ROM / Strength   AROM / PROM / Strength  AROM;Strength      Palpation   Palpation comment  Min fascial restrictions in left upper trapezius and scapularis region.    previous: Moderate fascial restrictions     AROM   Overall AROM Comments  Assessed seated. IR/er adducted    AROM Assessment Site  Shoulder    Right/Left Shoulder  Left    Left Shoulder  Flexion  120 Degrees   previous: 110   Left Shoulder ABduction  110 Degrees   previous: 81   Left Shoulder Internal Rotation  90 Degrees   previous: 90   Left Shoulder External Rotation  45 Degrees   previous: 40     Strength   Overall Strength Comments  Assessed seated. IR/er adducted    Strength Assessment Site  Shoulder    Right/Left Shoulder  Left    Left Shoulder Flexion  4/5   previous: 4/5   Left Shoulder ABduction  4+/5   previous: 4/5   Left Shoulder Internal Rotation  4/5   previous: 4+/5   Left Shoulder External Rotation  4/5   previous: 4+/5                      OT Education - 06/13/18 1115    Education Details  Discussed progress in therapy. Discussed following up with MD regarding decreased energy level. Patient provided with information for [place]. Patient is to continue established HEP: Shoulder A/ROM exercises.     Person(s) Educated  Patient    Methods  Explanation    Comprehension  Verbalized understanding  OT Short Term Goals - 06/13/18 1005      OT SHORT TERM GOAL #1   Title  Patient will be educated and independent with HEP to increase functional use of LUE and comfort level when sleeping at night.    Time  4    Period  Weeks    Status  Not Met      OT SHORT TERM GOAL #2   Title  Patient will increase A/ROM by 10 degrees or prior to baseline functional range per patient's report to increase functional reaching ability and allow patient to obtain a comfortable sleeping position.     Time  4    Period  Weeks    Status  Achieved      OT SHORT TERM GOAL #3   Title  Patient will report a decreased pain level in LUE with certain movements or positions of approximately 4/10 in order to be able to complete preferred daily tasks with increased comfort.     Time  4    Period  Weeks    Status  Not Met      OT SHORT TERM GOAL #4   Title  Patient will decrease fascial restrictions in left UE to min amount or less in order to  increase functional mobility needed to complete daily tasks.     Time  4    Period  Weeks    Status  Achieved               Plan - 06/13/18 1117    Clinical Impression Statement  A: Reassessment completed this date. Patient has had a recent set back and has missed a few therapy sessions. Patient reports that his left knee has been causing him pain and he has decreased energy/motivation to complete his exercises and/or do leisure or needed household activities. Patient has increased his A/ROM in his LUE by 5-10 degrees, his strength has increased for flexion and abduction although has decreased for IR/er. Pain continues to be a limiting issues as patient reports. Overall, patient has met 2/4 therapy goals. Pt states he feels like he has no energy and his motivation is low to do the things he needs to do at home as well as participating in his usual leisure activities. Discussed discharging from therapy at this time to focus on on his mental health. Recommended that patient follow up with his MD to discuss his lack of energy and motivation to see what he recommends. Pt agrees with recommendation.      Plan  P: D/C from OT services with HEP. Recommend that patient follow up with referring MD regarding decreased energy level.    Consulted and Agree with Plan of Care  Patient       Patient will benefit from skilled therapeutic intervention in order to improve the following deficits and impairments:  Decreased range of motion, Increased fascial restrictions, Pain, Impaired UE functional use  Visit Diagnosis: Other symptoms and signs involving the musculoskeletal system  Chronic left shoulder pain  Stiffness of left shoulder, not elsewhere classified    Problem List Patient Active Problem List   Diagnosis Date Noted  . Monoarticular arthritis 05/17/2018  . Lesions of both ulnar nerves 04/11/2018  . Solitary pulmonary nodule 06/20/2017  . Anticoagulation management encounter 06/05/2017   . GERD without esophagitis 05/10/2017  . Acute deep vein thrombosis (DVT) of femoral vein of left lower extremity ([place])   . Essential hypertension   . Morbid obesity ([place])   .  Dyspnea   . Bradycardia   . Saphenous vein clot, left 05/04/2017  . Cor pulmonale, chronic ([place]) 05/04/2017  . Type 2 diabetes mellitus with diabetic polyneuropathy, without long-term current use of insulin ([place])   . Chronic respiratory failure with hypoxia ([place])   . Acute deep vein thrombosis (DVT) of proximal vein of left lower extremity ([place])   . Combined congestive systolic and diastolic heart failure ([place])   . Atrial fibrillation with slow ventricular response ([place])   . Community acquired pneumonia     OCCUPATIONAL THERAPY DISCHARGE SUMMARY  Visits from Start of Care: 5  Current functional level related to goals / functional outcomes: See above   Remaining deficits: See above   Education / Equipment: See above Plan: Patient agrees to discharge.  Patient goals were partially met. Patient is being discharged due to a change in medical status.  ?????         Ailene Ravel, OTR/L,CBIS  [phone]  06/13/2018, 1:27 PM  [place], [place], [address] Phone: [phone]   Fax:  [phone]  Name: Roberto [name] MRN: [id number] Date of Birth: 1961/07/24

## 2018-06-18 ENCOUNTER — Other Ambulatory Visit: Payer: Self-pay | Admitting: Family Medicine

## 2018-06-18 ENCOUNTER — Ambulatory Visit (INDEPENDENT_AMBULATORY_CARE_PROVIDER_SITE_OTHER): Payer: Medicare HMO

## 2018-06-18 VITALS — BP 122/70 | HR 75 | Temp 98.5°F | Ht 72.0 in | Wt 362.2 lb

## 2018-06-18 DIAGNOSIS — E1142 Type 2 diabetes mellitus with diabetic polyneuropathy: Secondary | ICD-10-CM

## 2018-06-18 DIAGNOSIS — Z1211 Encounter for screening for malignant neoplasm of colon: Secondary | ICD-10-CM | POA: Insufficient documentation

## 2018-06-18 DIAGNOSIS — I4891 Unspecified atrial fibrillation: Secondary | ICD-10-CM

## 2018-06-18 DIAGNOSIS — Z0000 Encounter for general adult medical examination without abnormal findings: Secondary | ICD-10-CM | POA: Diagnosis not present

## 2018-06-18 DIAGNOSIS — I82412 Acute embolism and thrombosis of left femoral vein: Secondary | ICD-10-CM

## 2018-06-18 DIAGNOSIS — I5042 Chronic combined systolic (congestive) and diastolic (congestive) heart failure: Secondary | ICD-10-CM | POA: Diagnosis not present

## 2018-06-18 DIAGNOSIS — Z7901 Long term (current) use of anticoagulants: Secondary | ICD-10-CM

## 2018-06-18 DIAGNOSIS — Z1159 Encounter for screening for other viral diseases: Secondary | ICD-10-CM

## 2018-06-18 DIAGNOSIS — Z5181 Encounter for therapeutic drug level monitoring: Secondary | ICD-10-CM

## 2018-06-18 LAB — POCT INR: INR: 3 (ref 2.0–3.0)

## 2018-06-19 LAB — CMP14+EGFR
ALK PHOS: 127 IU/L — AB (ref 39–117)
ALT: 16 IU/L (ref 0–44)
AST: 15 IU/L (ref 0–40)
Albumin/Globulin Ratio: 1.3 (ref 1.2–2.2)
Albumin: 4.1 g/dL (ref 3.5–5.5)
BUN/Creatinine Ratio: 31 — ABNORMAL HIGH (ref 9–20)
BUN: 40 mg/dL — AB (ref 6–24)
Bilirubin Total: 0.5 mg/dL (ref 0.0–1.2)
CHLORIDE: 98 mmol/L (ref 96–106)
CO2: 24 mmol/L (ref 20–29)
Calcium: 9.5 mg/dL (ref 8.7–10.2)
Creatinine, Ser: 1.3 mg/dL — ABNORMAL HIGH (ref 0.76–1.27)
GFR calc Af Amer: 70 mL/min/{1.73_m2} (ref >59)
GFR calc non Af Amer: 60 mL/min/{1.73_m2} (ref >59)
GLUCOSE: 127 mg/dL — AB (ref 65–99)
Globulin, Total: 3.2 g/dL (ref 1.5–4.5)
Potassium: 4.1 mmol/L (ref 3.5–5.2)
Sodium: 141 mmol/L (ref 134–144)
Total Protein: 7.3 g/dL (ref 6.0–8.5)

## 2018-06-19 LAB — CBC
Hematocrit: 41.3 % (ref 37.5–51.0)
Hemoglobin: 13.7 g/dL (ref 13.0–17.7)
MCH: 29.7 pg (ref 26.6–33.0)
MCHC: 33.2 g/dL (ref 31.5–35.7)
MCV: 89 fL (ref 79–97)
Platelets: 443 10*3/uL (ref 150–450)
RBC: 4.62 x10E6/uL (ref 4.14–5.80)
RDW: 13 % (ref 12.3–15.4)
WBC: 9.1 10*3/uL (ref 3.4–10.8)

## 2018-06-19 LAB — BRAIN NATRIURETIC PEPTIDE: BNP: 120.4 pg/mL — AB (ref 0.0–100.0)

## 2018-06-19 LAB — HEPATITIS C ANTIBODY

## 2018-06-20 ENCOUNTER — Telehealth: Payer: Self-pay | Admitting: *Deleted

## 2018-06-20 NOTE — Patient Instructions (Addendum)
Roberto [name] , Thank you for taking time to come for your Medicare Wellness Visit. I appreciate your ongoing commitment to your health goals. Please review the following plan we discussed and let me know if I can assist you in the future.   These are the goals we discussed: Goals    . Weight (lb) < 280 lb (127 kg)       This is a list of the screening recommended for you and due dates:  Health Maintenance  Topic Date Due  . Complete foot exam   05/21/1970  . Eye exam for diabetics  05/21/1970  . Colon Cancer Screening  05/21/2010  . Flu Shot  01/23/2019*  . Pneumococcal vaccine  01/23/2019*  . Tetanus Vaccine  06/21/2019*  . Hemoglobin A1C  11/10/2018  .  Hepatitis C: One time screening is recommended by Center for Disease Control  (CDC) for  adults born from [birth year] through 1966.   Completed  . HIV Screening  Completed  *Topic was postponed. The date shown is not the original due date.    Colonoscopy, Adult A colonoscopy is an exam to look at the entire large intestine. During the exam, a lubricated, bendable tube is inserted into the anus and then passed into the rectum, colon, and other parts of the large intestine. A colonoscopy is often done as a part of normal colorectal screening or in response to certain symptoms, such as anemia, persistent diarrhea, abdominal pain, and blood in the stool. The exam can help screen for and diagnose medical problems, including:  Tumors.  Polyps.  Inflammation.  Areas of bleeding.  Tell a health care provider about:  Any allergies you have.  All medicines you are taking, including vitamins, herbs, eye drops, creams, and over-the-counter medicines.  Any problems you or family members have had with anesthetic medicines.  Any blood disorders you have.  Any surgeries you have had.  Any medical conditions you have.  Any problems you have had passing stool. What are the risks? Generally, this is a safe procedure. However, problems may  occur, including:  Bleeding.  A tear in the intestine.  A reaction to medicines given during the exam.  Infection (rare).  What happens before the procedure? Eating and drinking restrictions Follow instructions from your health care provider about eating and drinking, which may include:  A few days before the procedure - follow a low-fiber diet. Avoid nuts, seeds, dried fruit, raw fruits, and vegetables.  1-3 days before the procedure - follow a clear liquid diet. Drink only clear liquids, such as clear broth or bouillon, black coffee or tea, clear juice, clear soft drinks or sports drinks, gelatin dessert, and popsicles. Avoid any liquids that contain red or purple dye.  On the day of the procedure - do not eat or drink anything during the 2 hours before the procedure, or within the time period that your health care provider recommends.  Bowel prep If you were prescribed an oral bowel prep to clean out your colon:  Take it as told by your health care provider. Starting the day before your procedure, you will need to drink a large amount of medicated liquid. The liquid will cause you to have multiple loose stools until your stool is almost clear or light green.  If your skin or anus gets irritated from diarrhea, you may use these to relieve the irritation: ? Medicated wipes, such as adult wet wipes with aloe and vitamin E. ? A skin soothing-product like  petroleum jelly.  If you vomit while drinking the bowel prep, take a break for up to 60 minutes and then begin the bowel prep again. If vomiting continues and you cannot take the bowel prep without vomiting, call your health care provider.  General instructions  Ask your health care provider about changing or stopping your regular medicines. This is especially important if you are taking diabetes medicines or blood thinners.  Plan to have someone take you home from the hospital or clinic. What happens during the procedure?  An IV  tube may be inserted into one of your veins.  You will be given medicine to help you relax (sedative).  To reduce your risk of infection: ? Your health care team will wash or sanitize their hands. ? Your anal area will be washed with soap.  You will be asked to lie on your side with your knees bent.  Your health care provider will lubricate a long, thin, flexible tube. The tube will have a camera and a light on the end.  The tube will be inserted into your anus.  The tube will be gently eased through your rectum and colon.  Air will be delivered into your colon to keep it open. You may feel some pressure or cramping.  The camera will be used to take images during the procedure.  A small tissue sample may be removed from your body to be examined under a microscope (biopsy). If any potential problems are found, the tissue will be sent to a lab for testing.  If small polyps are found, your health care provider may remove them and have them checked for cancer cells.  The tube that was inserted into your anus will be slowly removed. The procedure may vary among health care providers and hospitals. What happens after the procedure?  Your blood pressure, heart rate, breathing rate, and blood oxygen level will be monitored until the medicines you were given have worn off.  Do not drive for 24 hours after the exam.  You may have a small amount of blood in your stool.  You may pass gas and have mild abdominal cramping or bloating due to the air that was used to inflate your colon during the exam.  It is up to you to get the results of your procedure. Ask your health care provider, or the department performing the procedure, when your results will be ready. This information is not intended to replace advice given to you by your health care provider. Make sure you discuss any questions you have with your health care provider. Document Released: 09/08/2000 Document Revised: 07/12/2016  Document Reviewed: 11/23/2015 Elsevier Interactive Patient Education  2018 [employer].   Diabetes and Foot Care Diabetes may cause you to have problems because of poor blood supply (circulation) to your feet and legs. This may cause the skin on your feet to become thinner, break easier, and heal more slowly. Your skin may become dry, and the skin may peel and crack. You may also have nerve damage in your legs and feet causing decreased feeling in them. You may not notice minor injuries to your feet that could lead to infections or more serious problems. Taking care of your feet is one of the most important things you can do for yourself. Follow these instructions at home:  Wear shoes at all times, even in the house. Do not go barefoot. Bare feet are easily injured.  Check your feet daily for blisters, cuts, and redness.  If you cannot see the bottom of your feet, use a mirror or ask someone for help.  Wash your feet with warm water (do not use hot water) and mild soap. Then pat your feet and the areas between your toes until they are completely dry. Do not soak your feet as this can dry your skin.  Apply a moisturizing lotion or petroleum jelly (that does not contain alcohol and is unscented) to the skin on your feet and to dry, brittle toenails. Do not apply lotion between your toes.  Trim your toenails straight across. Do not dig under them or around the cuticle. File the edges of your nails with an emery board or nail file.  Do not cut corns or calluses or try to remove them with medicine.  Wear clean socks or stockings every day. Make sure they are not too tight. Do not wear knee-high stockings since they may decrease blood flow to your legs.  Wear shoes that fit properly and have enough cushioning. To break in new shoes, wear them for just a few hours a day. This prevents you from injuring your feet. Always look in your shoes before you put them on to be sure there are no objects  inside.  Do not cross your legs. This may decrease the blood flow to your feet.  If you find a minor scrape, cut, or break in the skin on your feet, keep it and the skin around it clean and dry. These areas may be cleansed with mild soap and water. Do not cleanse the area with peroxide, alcohol, or iodine.  When you remove an adhesive bandage, be sure not to damage the skin around it.  If you have a wound, look at it several times a day to make sure it is healing.  Do not use heating pads or hot water bottles. They may burn your skin. If you have lost feeling in your feet or legs, you may not know it is happening until it is too late.  Make sure your health care provider performs a complete foot exam at least annually or more often if you have foot problems. Report any cuts, sores, or bruises to your health care provider immediately. Contact a health care provider if:  You have an injury that is not healing.  You have cuts or breaks in the skin.  You have an ingrown nail.  You notice redness on your legs or feet.  You feel burning or tingling in your legs or feet.  You have pain or cramps in your legs and feet.  Your legs or feet are numb.  Your feet always feel cold. Get help right away if:  There is increasing redness, swelling, or pain in or around a wound.  There is a red line that goes up your leg.  Pus is coming from a wound.  You develop a fever or as directed by your health care provider.  You notice a bad smell coming from an ulcer or wound. This information is not intended to replace advice given to you by your health care provider. Make sure you discuss any questions you have with your health care provider. Document Released: 09/08/2000 Document Revised: 02/17/2016 Document Reviewed: 02/18/2013 Elsevier Interactive Patient Education  2017 [place] Prevention in the Home Falls can cause injuries. They can happen to people of all ages. There are  many things you can do to make your home safe and to help prevent falls. What  can I do on the outside of my home?  Regularly fix the edges of walkways and driveways and fix any cracks.  Remove anything that might make you trip as you walk through a door, such as a raised step or threshold.  Trim any bushes or trees on the path to your home.  Use bright outdoor lighting.  Clear any walking paths of anything that might make someone trip, such as rocks or tools.  Regularly check to see if handrails are loose or broken. Make sure that both sides of any steps have handrails.  Any raised decks and porches should have guardrails on the edges.  Have any leaves, snow, or ice cleared regularly.  Use sand or salt on walking paths during winter.  Clean up any spills in your garage right away. This includes oil or grease spills. What can I do in the bathroom?  Use night lights.  Install grab bars by the toilet and in the tub and shower. Do not use towel bars as grab bars.  Use non-skid mats or decals in the tub or shower.  If you need to sit down in the shower, use a plastic, non-slip stool.  Keep the floor dry. Clean up any water that spills on the floor as soon as it happens.  Remove soap buildup in the tub or shower regularly.  Attach bath mats securely with double-sided non-slip rug tape.  Do not have throw rugs and other things on the floor that can make you trip. What can I do in the bedroom?  Use night lights.  Make sure that you have a light by your bed that is easy to reach.  Do not use any sheets or blankets that are too big for your bed. They should not hang down onto the floor.  Have a firm chair that has side arms. You can use this for support while you get dressed.  Do not have throw rugs and other things on the floor that can make you trip. What can I do in the kitchen?  Clean up any spills right away.  Avoid walking on wet floors.  Keep items that you use a  lot in easy-to-reach places.  If you need to reach something above you, use a strong step stool that has a grab bar.  Keep electrical cords out of the way.  Do not use floor polish or wax that makes floors slippery. If you must use wax, use non-skid floor wax.  Do not have throw rugs and other things on the floor that can make you trip. What can I do with my stairs?  Do not leave any items on the stairs.  Make sure that there are handrails on both sides of the stairs and use them. Fix handrails that are broken or loose. Make sure that handrails are as long as the stairways.  Check any carpeting to make sure that it is firmly attached to the stairs. Fix any carpet that is loose or worn.  Avoid having throw rugs at the top or bottom of the stairs. If you do have throw rugs, attach them to the floor with carpet tape.  Make sure that you have a light switch at the top of the stairs and the bottom of the stairs. If you do not have them, ask someone to add them for you. What else can I do to help prevent falls?  Wear shoes that: ? Do not have high heels. ? Have rubber bottoms. ? Are  comfortable and fit you well. ? Are closed at the toe. Do not wear sandals.  If you use a stepladder: ? Make sure that it is fully opened. Do not climb a closed stepladder. ? Make sure that both sides of the stepladder are locked into place. ? Ask someone to hold it for you, if possible.  Clearly mark and make sure that you can see: ? Any grab bars or handrails. ? First and last steps. ? Where the edge of each step is.  Use tools that help you move around (mobility aids) if they are needed. These include: ? Canes. ? Walkers. ? Scooters. ? Crutches.  Turn on the lights when you go into a dark area. Replace any light bulbs as soon as they burn out.  Set up your furniture so you have a clear path. Avoid moving your furniture around.  If any of your floors are uneven, fix them.  If there are any  pets around you, be aware of where they are.  Review your medicines with your doctor. Some medicines can make you feel dizzy. This can increase your chance of falling. Ask your doctor what other things that you can do to help prevent falls. This information is not intended to replace advice given to you by your health care provider. Make sure you discuss any questions you have with your health care provider. Document Released: 07/08/2009 Document Revised: 02/17/2016 Document Reviewed: 10/16/2014 Elsevier Interactive Patient Education  2018 [place] Maintenance, Male A healthy lifestyle and preventive care is important for your health and wellness. Ask your health care provider about what schedule of regular examinations is right for you. What should I know about weight and diet? Eat a Healthy Diet  Eat plenty of vegetables, fruits, whole grains, low-fat dairy products, and lean protein.  Do not eat a lot of foods high in solid fats, added sugars, or salt.  Maintain a Healthy Weight Regular exercise can help you achieve or maintain a healthy weight. You should:  Do at least 150 minutes of exercise each week. The exercise should increase your heart rate and make you sweat (moderate-intensity exercise).  Do strength-training exercises at least twice a week.  Watch Your Levels of Cholesterol and Blood Lipids  Have your blood tested for lipids and cholesterol every 5 years starting at 58 years of age. If you are at high risk for heart disease, you should start having your blood tested when you are 58 years old. You may need to have your cholesterol levels checked more often if: ? Your lipid or cholesterol levels are high. ? You are older than 58 years of age. ? You are at high risk for heart disease.  What should I know about cancer screening? Many types of cancers can be detected early and may often be prevented. Lung Cancer  You should be screened every year for lung  cancer if: ? You are a current smoker who has smoked for at least 30 years. ? You are a former smoker who has quit within the past 15 years.  Talk to your health care provider about your screening options, when you should start screening, and how often you should be screened.  Colorectal Cancer  Routine colorectal cancer screening usually begins at 58 years of age and should be repeated every 5-10 years until you are 58 years old. You may need to be screened more often if early forms of precancerous polyps or small growths are found.  Your health care provider may recommend screening at an earlier age if you have risk factors for colon cancer.  Your health care provider may recommend using home test kits to check for hidden blood in the stool.  A small camera at the end of a tube can be used to examine your colon (sigmoidoscopy or colonoscopy). This checks for the earliest forms of colorectal cancer.  Prostate and Testicular Cancer  Depending on your age and overall health, your health care provider may do certain tests to screen for prostate and testicular cancer.  Talk to your health care provider about any symptoms or concerns you have about testicular or prostate cancer.  Skin Cancer  Check your skin from head to toe regularly.  Tell your health care provider about any new moles or changes in moles, especially if: ? There is a change in a mole's size, shape, or color. ? You have a mole that is larger than a pencil eraser.  Always use sunscreen. Apply sunscreen liberally and repeat throughout the day.  Protect yourself by wearing long sleeves, pants, a wide-brimmed hat, and sunglasses when outside.  What should I know about heart disease, diabetes, and high blood pressure?  If you are 100-1 years of age, have your blood pressure checked every 3-5 years. If you are 76 years of age or older, have your blood pressure checked every year. You should have your blood pressure measured  twice-once when you are at a hospital or clinic, and once when you are not at a hospital or clinic. Record the average of the two measurements. To check your blood pressure when you are not at a hospital or clinic, you can use: ? An automated blood pressure machine at a pharmacy. ? A home blood pressure monitor.  Talk to your health care provider about your target blood pressure.  If you are between 11-6 years old, ask your health care provider if you should take aspirin to prevent heart disease.  Have regular diabetes screenings by checking your fasting blood sugar level. ? If you are at a normal weight and have a low risk for diabetes, have this test once every three years after the age of 53. ? If you are overweight and have a high risk for diabetes, consider being tested at a younger age or more often.  A one-time screening for abdominal aortic aneurysm (AAA) by ultrasound is recommended for men aged 63-75 years who are current or former smokers. What should I know about preventing infection? Hepatitis B If you have a higher risk for hepatitis B, you should be screened for this virus. Talk with your health care provider to find out if you are at risk for hepatitis B infection. Hepatitis C Blood testing is recommended for:  Everyone born from [birth year] through 1966.  Anyone with known risk factors for hepatitis C.  Sexually Transmitted Diseases (STDs)  You should be screened each year for STDs including gonorrhea and chlamydia if: ? You are sexually active and are younger than 59 years of age. ? You are older than 58 years of age and your health care provider tells you that you are at risk for this type of infection. ? Your sexual activity has changed since you were last screened and you are at an increased risk for chlamydia or gonorrhea. Ask your health care provider if you are at risk.  Talk with your health care provider about whether you are at high risk of being infected with HIV.  Your health care provider may recommend a prescription medicine to help prevent HIV infection.  What else can I do?  Schedule regular health, dental, and eye exams.  Stay current with your vaccines (immunizations).  Do not use any tobacco products, such as cigarettes, chewing tobacco, and e-cigarettes. If you need help quitting, ask your health care provider.  Limit alcohol intake to no more than 2 drinks per day. One drink equals 12 ounces of beer, 5 ounces of wine, or 1 ounces of hard liquor.  Do not use street drugs.  Do not share needles.  Ask your health care provider for help if you need support or information about quitting drugs.  Tell your health care provider if you often feel depressed.  Tell your health care provider if you have ever been abused or do not feel safe at home. This information is not intended to replace advice given to you by your health care provider. Make sure you discuss any questions you have with your health care provider. Document Released: 03/09/2008 Document Revised: 05/10/2016 Document Reviewed: 06/15/2015 Elsevier Interactive Patient Education  [employer].

## 2018-06-20 NOTE — Telephone Encounter (Signed)
-----  Message from Sherene Sires, DO sent at 06/19/2018  7:40 PM EDT ----- Please let patient know his labs seemed to be about at baseline but that if he isn't feeling well he can come into see myself or any of the doctors and we can talk about it -Dr. Criss Rosales

## 2018-06-20 NOTE — Telephone Encounter (Signed)
Pt informed and asked if he would like to make an appointment and he said he would have to call us back.  Katharina Caper, Lakea Mittelman D, [place]

## 2018-06-20 NOTE — Progress Notes (Signed)
Subjective:   Roberto [name] is a 57 y.o. male who presents for Medicare Annual/Subsequent preventive examination.  Review of Systems:  Physical assessment deferred to PCP.  Cardiac Risk Factors include: diabetes mellitus;sedentary lifestyle;male gender;obesity (BMI >30kg/m2);hypertension    Objective:    Vitals: BP 122/70   Pulse 75   Temp 98.5 F (36.9 C) (Oral)   Ht 6' (1.829 m)   Wt (!) 362 lb 3.2 oz (164.3 kg)   SpO2 92%   BMI 49.12 kg/m   Body mass index is 49.12 kg/m.  Advanced Directives 06/18/2018 05/17/2018 05/17/2018 05/16/2018 05/14/2018 05/07/2018 04/30/2018  Does Patient Have a Medical Advance Directive? _0  No No  Would patient like information on creating a medical advance directive? No - Patient declined No - Patient declined No - Patient declined Yes (MAU/Ambulatory/Procedural Areas - Information given) Yes (MAU/Ambulatory/Procedural Areas - Information given) Yes (MAU/Ambulatory/Procedural Areas - Information given) Yes (MAU/Ambulatory/Procedural Areas - Information given)    Tobacco Social History   Tobacco Use  Smoking Status Never Smoker  Smokeless Tobacco Never Used     Clinical Intake:  Pre-visit preparation completed: Yes  Pain : No/denies pain   Nutritional Status: BMI > 30  Obese Diabetes: Yes CBG done?: No Did pt. bring in CBG monitor from home?: No  How often do you need to have someone help you when you read instructions, pamphlets, or other written materials from your doctor or pharmacy?: 1 - Never What is the last grade level you completed in school?: HS graduate  Interpreter Needed?: No   Past Medical History:  Diagnosis Date  . Asthma   . CHF (congestive heart failure) ([place])   . COPD (chronic obstructive pulmonary disease) ([place])   . Diabetes mellitus without complication ([place])   . Hypertension    Past Surgical History:  Procedure Laterality Date  . SHOULDER SURGERY     Family History  Problem Relation Age of Onset    . Diabetes Mother   . Cancer Mother   . Diabetes Father   . High blood pressure Father   . Diabetes Brother   . Diabetes Brother    Social History   Socioeconomic History  . Marital status: Divorced    Spouse name: Not on file  . Number of children: 2  . Years of education: 12th grade  . Highest education level: High school graduate  Occupational History  . 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 Financial resource strain: Not very hard  . Food insecurity:    Worry: Never true    Inability: Never true  . Transportation needs:    Medical: No    Non-medical: No  Tobacco Use  . Smoking status: Never Smoker  . Smokeless tobacco: Never Used  Substance and Sexual Activity  . Alcohol use: No  . Drug use: No  . Sexual activity: Not Currently    Birth control/protection: Condom  Lifestyle  . Physical activity:    Days per week: 7 days    Minutes per session: 10 min  . Stress: Not at all  Relationships  . Social connections:    Talks on phone: Three times a week    Gets together: Three times a week    Attends religious service: 1 to 4 times per year    Active member of club or organization: No    Attends meetings of clubs or organizations: Never    Relationship status: Divorced  Other Topics Concern  . Not on file  Social History Narrative   Lives in senior apartments, lives alone. Ground floor. Has social activities daily. Uses treadmill. No steps, grab rails in bathroom, smoke alarm, wears seat belt. No firearms.     Outpatient Encounter Medications as of 06/18/2018  Medication Sig  . acetaminophen (TYLENOL) 325 MG tablet Take 2 tablets (650 mg total) by mouth every 6 (six) hours as needed.  Marland Kitchen amLODipine (NORVASC) 5 MG tablet TAKE 1 TABLET EVERY DAY  . Blood Glucose Monitoring Suppl (ACCU-CHEK AVIVA PLUS) w/Device KIT TEST TWO TIMES DAILY  . cholecalciferol (VITAMIN D) 1000 units tablet Take 1 tablet (1,000 Units total) by  mouth daily.  Regino Schultze Bandages & Supports (WRIST SUPPORT/ELASTIC/FIRM LG) MISC   . ferrous sulfate 325 (65 FE) MG tablet Take 325 mg by mouth daily.  Marland Kitchen lisinopril (PRINIVIL,ZESTRIL) 10 MG tablet TAKE 1 TABLET EVERY DAY  . metFORMIN (GLUCOPHAGE) 1000 MG tablet Take 1,000 mg by mouth daily.   . Multiple Vitamin (MULTIVITAMIN WITH MINERALS) TABS tablet Take 1 tablet by mouth daily.  . mupirocin ointment (BACTROBAN) 2 % Place 1 application into the nose 2 (two) times daily.  . OXYGEN Inhale 5 L into the lungs continuous.  Marland Kitchen PRESCRIPTION MEDICATION Inhale into the lungs as needed (whenever sleeping - naps or at night). CPAP  . torsemide (DEMADEX) 20 MG tablet TAKE 3 TABLETS AT BEDTIME  . warfarin (COUMADIN) 10 MG tablet TAKE 1 TABLET EVERY DAY  WITH  TWO WARFARIN  1MG  TABLETS FOR A TOTAL OF 12 MG DAILY  . ipratropium-albuterol (DUONEB) 0.5-2.5 (3) MG/3ML SOLN Take 3 mLs by nebulization every 4 (four) hours as needed. (Patient not taking: Reported on 06/18/2018)  . naproxen sodium (ALEVE) 220 MG tablet Take 220 mg by mouth every 12 (twelve) hours as needed (pain).  . pantoprazole (PROTONIX) 40 MG tablet Take 1 tablet (40 mg total) by mouth daily as needed (acid reflux). (Patient not taking: Reported on 06/18/2018)  . traMADol-acetaminophen (ULTRACET) 37.5-325 MG tablet Take 1 tablet by mouth every 6 (six) hours as needed. (Patient not taking: Reported on 06/18/2018)   No facility-administered encounter medications on file as of 06/18/2018.    Activities of Daily Living In your present state of health, do you have any difficulty performing the following activities: 06/18/2018 01/22/2018  Hearing? N N  Vision? N N  Difficulty concentrating or making decisions? N N  Walking or climbing stairs? N Y  Dressing or bathing? N N  Doing errands, shopping? N -  Preparing Food and eating ? N -  Using the Toilet? N -  In the past six months, have you accidently leaked urine? N -  Do you have problems with  loss of bowel control? N -  Managing your Medications? N -  Managing your Finances? N -  Housekeeping or managing your Housekeeping? N -  Some recent data might be hidden   Patient Care Team: Sherene Sires, DO as PCP - General (Family Medicine) Jerline Pain, MD as PCP - Cardiology (Cardiology)   Assessment:   This is a routine wellness examination for Marina.  Exercise Activities and Dietary recommendations Current Exercise Habits: Home exercise routine, Type of exercise: treadmill, Time (Minutes): 10, Frequency (Times/Week): 7, Weekly Exercise (Minutes/Week): 70, Intensity: Mild, Exercise limited by: orthopedic condition(s);respiratory conditions(s)  Goals    . Weight (lb) < 280 lb (127 kg)      Fall Risk Fall Risk  06/18/2018 05/17/2018 05/17/2018  04/09/2018 03/08/2018  Falls in the past year? _0    Is the patient's home free of loose throw rugs in walkways, pet beds, electrical cords, etc?   yes      Grab bars in the bathroom? yes      Handrails on the stairs?   no      Adequate lighting?   yes  Depression Screen PHQ 2/9 Scores 06/18/2018 05/17/2018 05/17/2018 05/10/2018  PHQ - 2 Score 0 0 0 0    Immunization History  Administered Date(s) Administered  . PPD Test 05/09/2017   Screening Tests Health Maintenance  Topic Date Due  . FOOT EXAM  05/21/1970  . OPHTHALMOLOGY EXAM  05/21/1970  . TETANUS/TDAP  05/22/1979  . COLONOSCOPY  05/21/2010  . INFLUENZA VACCINE  01/23/2019 (Originally 04/25/2018)  . PNEUMOCOCCAL POLYSACCHARIDE VACCINE AGE 23-64 HIGH RISK  01/23/2019 (Originally 05/21/1962)  . HEMOGLOBIN A1C  11/10/2018  . Hepatitis C Screening  Completed  . HIV Screening  Completed   Patient states he had a Prevnar-13 vaccine via [facility] in [place], [place] but it caused him to have a frozen shoulder. He is unable to provide name of physician to obtain records.  Cancer Screenings: Lung: Low Dose CT Chest recommended if Age 56-80 years, 30 pack-year currently  smoking OR have quit w/in 15years. Patient does not qualify. Colorectal: Patient attempting to schedule with [facility] but they want him to provide copy of last colonoscopy from [facility]. He states he is working on getting this from them.   Additional Screenings:  Hepatitis C Screening: completed     Plan:   Foot exam deferred to PCP. Patient states he had a colonoscopy with Dr Posey Pronto at [facility] approximately 2.5 years ago. Has been referred to [facility] but they are waiting on patient to provide records from [place]. RN will attempt to obtain copy of colonoscopy also. Patient encouraged to schedule appt with PCP for DM f/u and for complaints of fatigue and knee pain. He declined at this time.   I have personally reviewed and noted the following in the patient's chart:   . Medical and social history . Use of alcohol, tobacco or illicit drugs  . Current medications and supplements . Functional ability and status . Nutritional status . Physical activity . Advanced directives . List of other physicians . Hospitalizations, surgeries, and ER visits in previous 12 months . Vitals . Screenings to include cognitive, depression, and falls . Referrals and appointments  In addition, I have reviewed and discussed with patient certain preventive protocols, quality metrics, and best practice recommendations. A written personalized care plan for preventive services as well as general preventive health recommendations were provided to patient.    Esau Grew, RN  06/20/2018

## 2018-06-26 ENCOUNTER — Other Ambulatory Visit (HOSPITAL_COMMUNITY): Payer: Self-pay | Admitting: Family Medicine

## 2018-06-27 ENCOUNTER — Encounter (INDEPENDENT_AMBULATORY_CARE_PROVIDER_SITE_OTHER): Payer: Medicare HMO | Admitting: Ophthalmology

## 2018-06-30 DIAGNOSIS — R0600 Dyspnea, unspecified: Secondary | ICD-10-CM | POA: Diagnosis not present

## 2018-06-30 DIAGNOSIS — I509 Heart failure, unspecified: Secondary | ICD-10-CM | POA: Diagnosis not present

## 2018-07-01 ENCOUNTER — Encounter (INDEPENDENT_AMBULATORY_CARE_PROVIDER_SITE_OTHER): Payer: Medicare HMO | Admitting: Ophthalmology

## 2018-07-01 DIAGNOSIS — E113313 Type 2 diabetes mellitus with moderate nonproliferative diabetic retinopathy with macular edema, bilateral: Secondary | ICD-10-CM

## 2018-07-01 DIAGNOSIS — H35033 Hypertensive retinopathy, bilateral: Secondary | ICD-10-CM | POA: Diagnosis not present

## 2018-07-01 DIAGNOSIS — H43813 Vitreous degeneration, bilateral: Secondary | ICD-10-CM | POA: Diagnosis not present

## 2018-07-01 DIAGNOSIS — M170 Bilateral primary osteoarthritis of knee: Secondary | ICD-10-CM | POA: Diagnosis not present

## 2018-07-01 DIAGNOSIS — I10 Essential (primary) hypertension: Secondary | ICD-10-CM

## 2018-07-01 DIAGNOSIS — H33302 Unspecified retinal break, left eye: Secondary | ICD-10-CM | POA: Diagnosis not present

## 2018-07-01 DIAGNOSIS — E11311 Type 2 diabetes mellitus with unspecified diabetic retinopathy with macular edema: Secondary | ICD-10-CM | POA: Diagnosis not present

## 2018-07-01 DIAGNOSIS — M2550 Pain in unspecified joint: Secondary | ICD-10-CM | POA: Diagnosis not present

## 2018-07-01 DIAGNOSIS — M25542 Pain in joints of left hand: Secondary | ICD-10-CM | POA: Diagnosis not present

## 2018-07-01 DIAGNOSIS — H2513 Age-related nuclear cataract, bilateral: Secondary | ICD-10-CM

## 2018-07-04 DIAGNOSIS — J9621 Acute and chronic respiratory failure with hypoxia: Secondary | ICD-10-CM | POA: Diagnosis not present

## 2018-07-04 DIAGNOSIS — I509 Heart failure, unspecified: Secondary | ICD-10-CM | POA: Diagnosis not present

## 2018-07-04 DIAGNOSIS — R0902 Hypoxemia: Secondary | ICD-10-CM | POA: Diagnosis not present

## 2018-07-04 DIAGNOSIS — G4733 Obstructive sleep apnea (adult) (pediatric): Secondary | ICD-10-CM | POA: Diagnosis not present

## 2018-07-06 IMAGING — DX DG CHEST 2V
2 series · 2 of 2 positions shown · non-contrast
Comparison: None.

CLINICAL DATA: Shortness of breath.

EXAM:
CHEST  2 VIEW

[w chest pa]
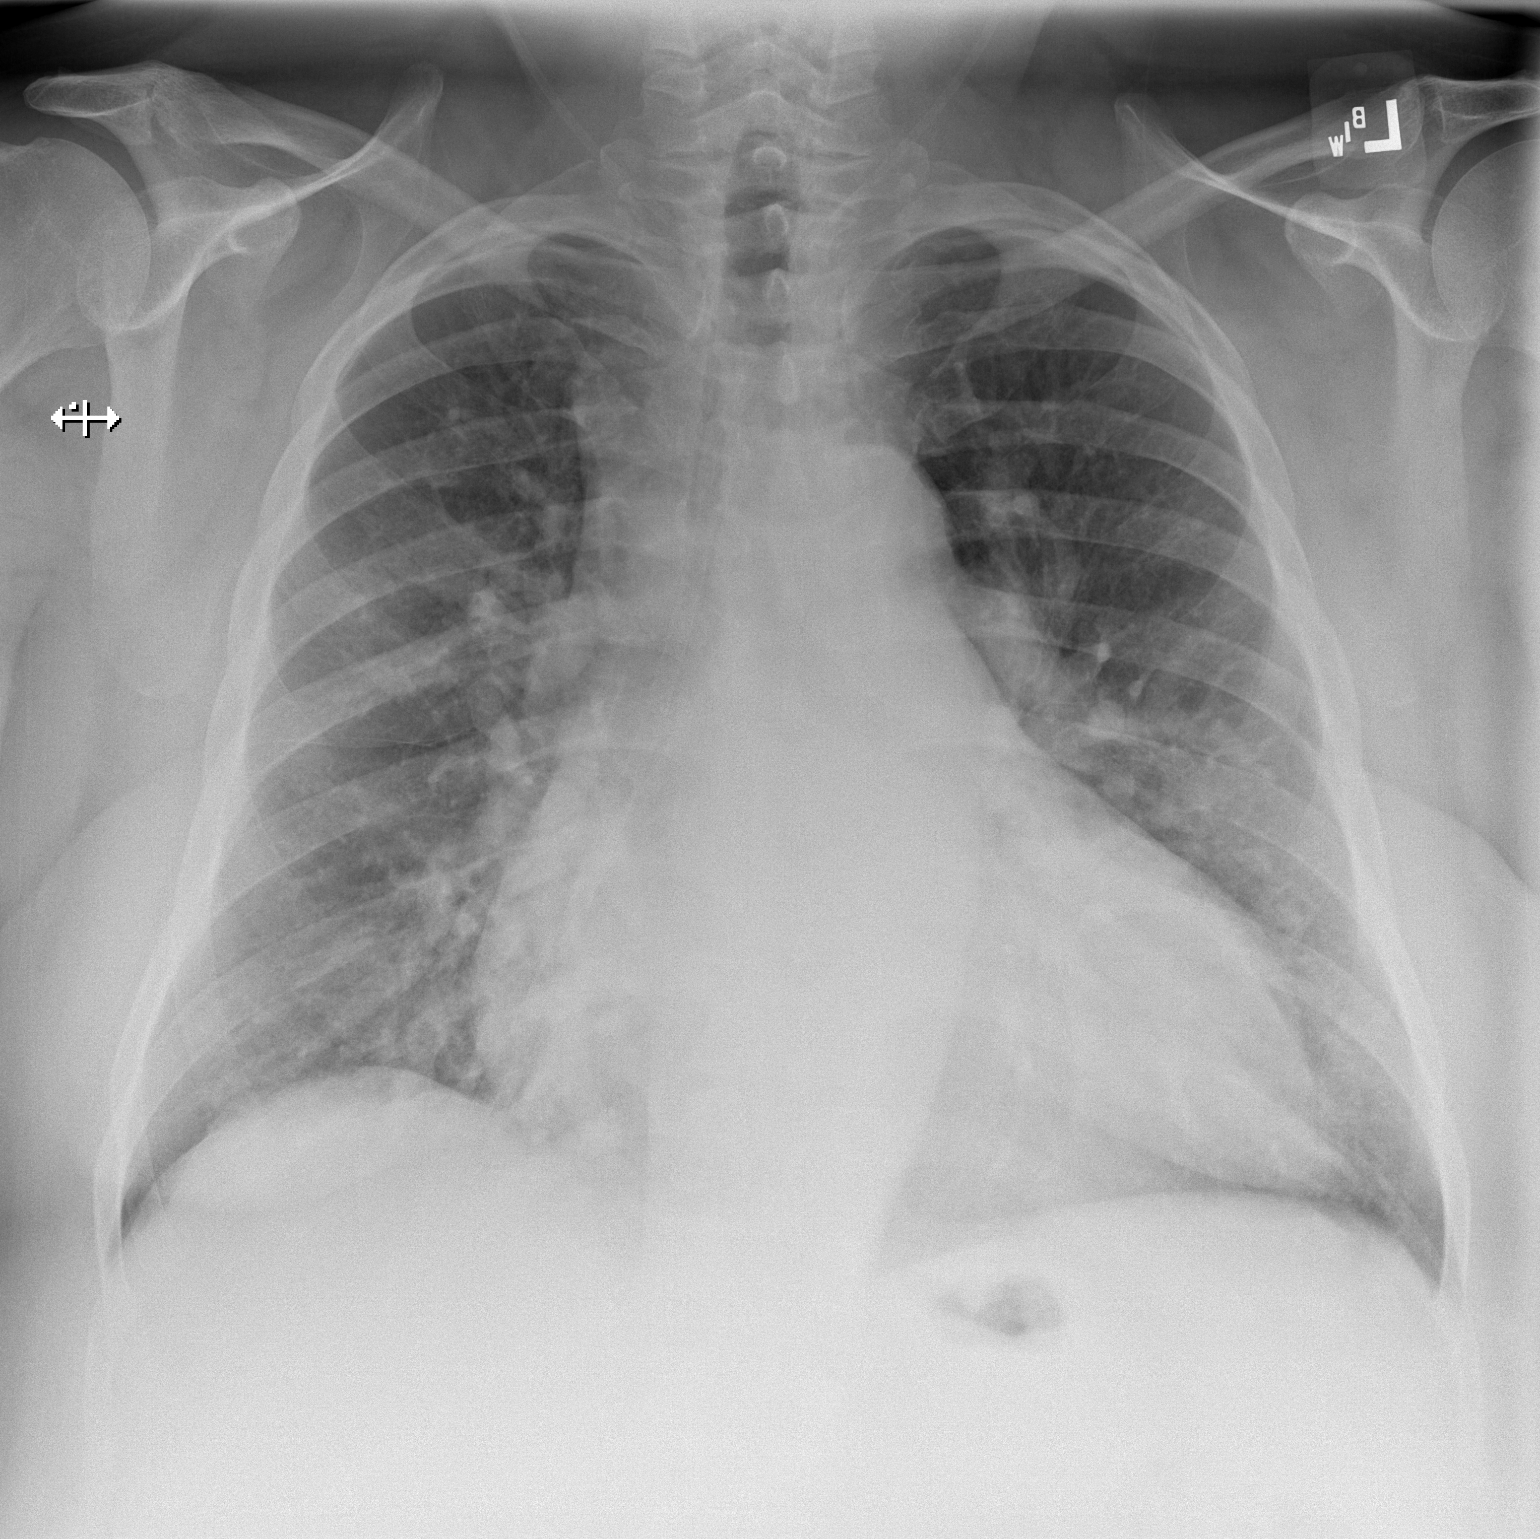

[w chest lat]
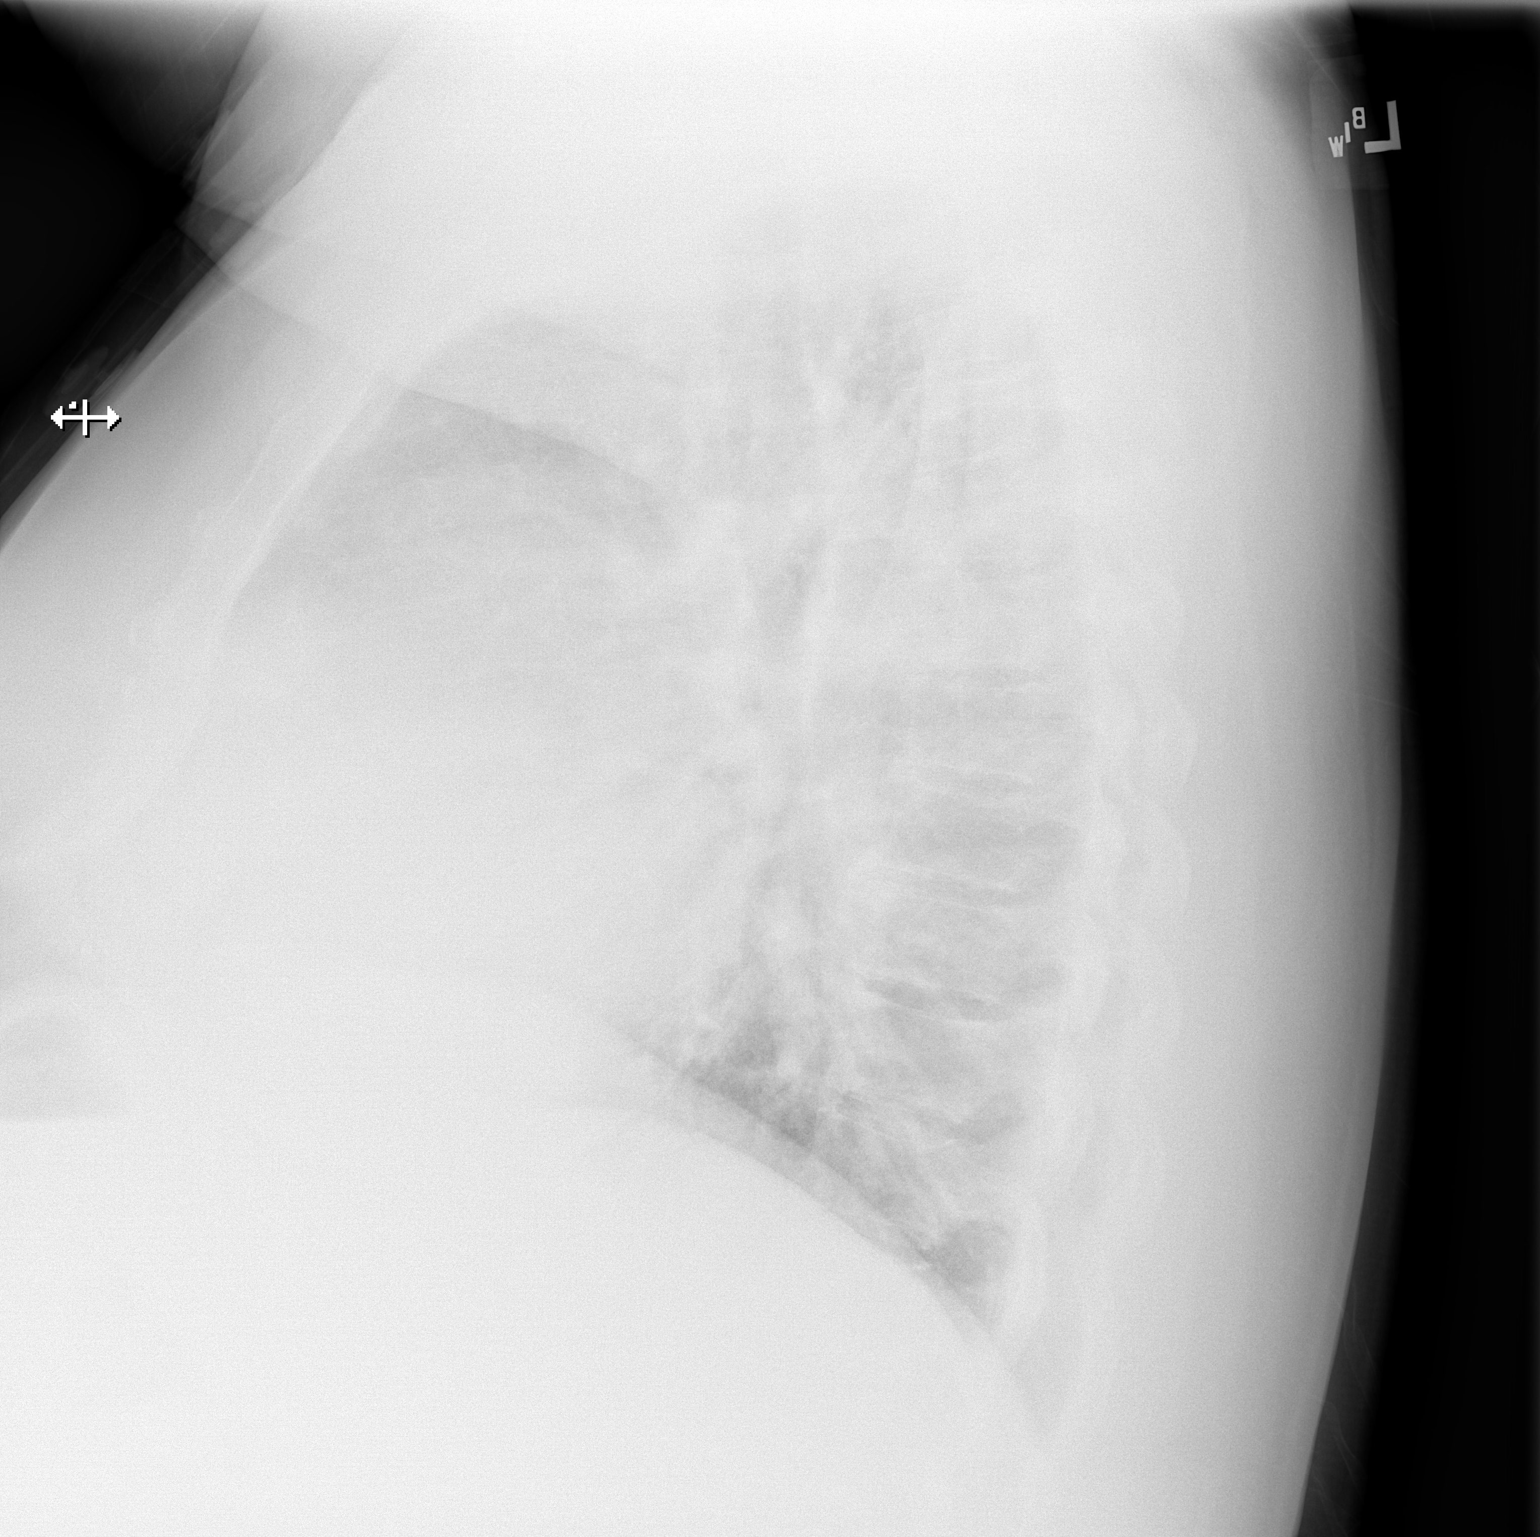

[2 of 2 positions shown; findings below may reference images not displayed]

FINDINGS: The cardio pericardial silhouette is enlarged. There is pulmonary
vascular congestion without overt pulmonary edema although a
component of dependent interstitial edema towards the bases is
suspected. Lateral film limited by underpenetration. No pleural
effusion. The visualized bony structures of the thorax are intact.
IMPRESSION: Enlargement of the cardiopericardial silhouette with vascular
congestion and potential interstitial pulmonary edema.

## 2018-07-08 ENCOUNTER — Encounter (INDEPENDENT_AMBULATORY_CARE_PROVIDER_SITE_OTHER): Payer: Medicare HMO | Admitting: Ophthalmology

## 2018-07-29 ENCOUNTER — Encounter (INDEPENDENT_AMBULATORY_CARE_PROVIDER_SITE_OTHER): Payer: Medicare HMO | Admitting: Ophthalmology

## 2018-07-31 DIAGNOSIS — R0600 Dyspnea, unspecified: Secondary | ICD-10-CM | POA: Diagnosis not present

## 2018-07-31 DIAGNOSIS — I509 Heart failure, unspecified: Secondary | ICD-10-CM | POA: Diagnosis not present

## 2018-07-31 IMAGING — CR DG CHEST 2V
2 series · 2 of 2 positions shown · non-contrast
Comparison: 05/03/2017

CLINICAL DATA: Shortness of breath

EXAM:
CHEST  2 VIEW

[chest pa]
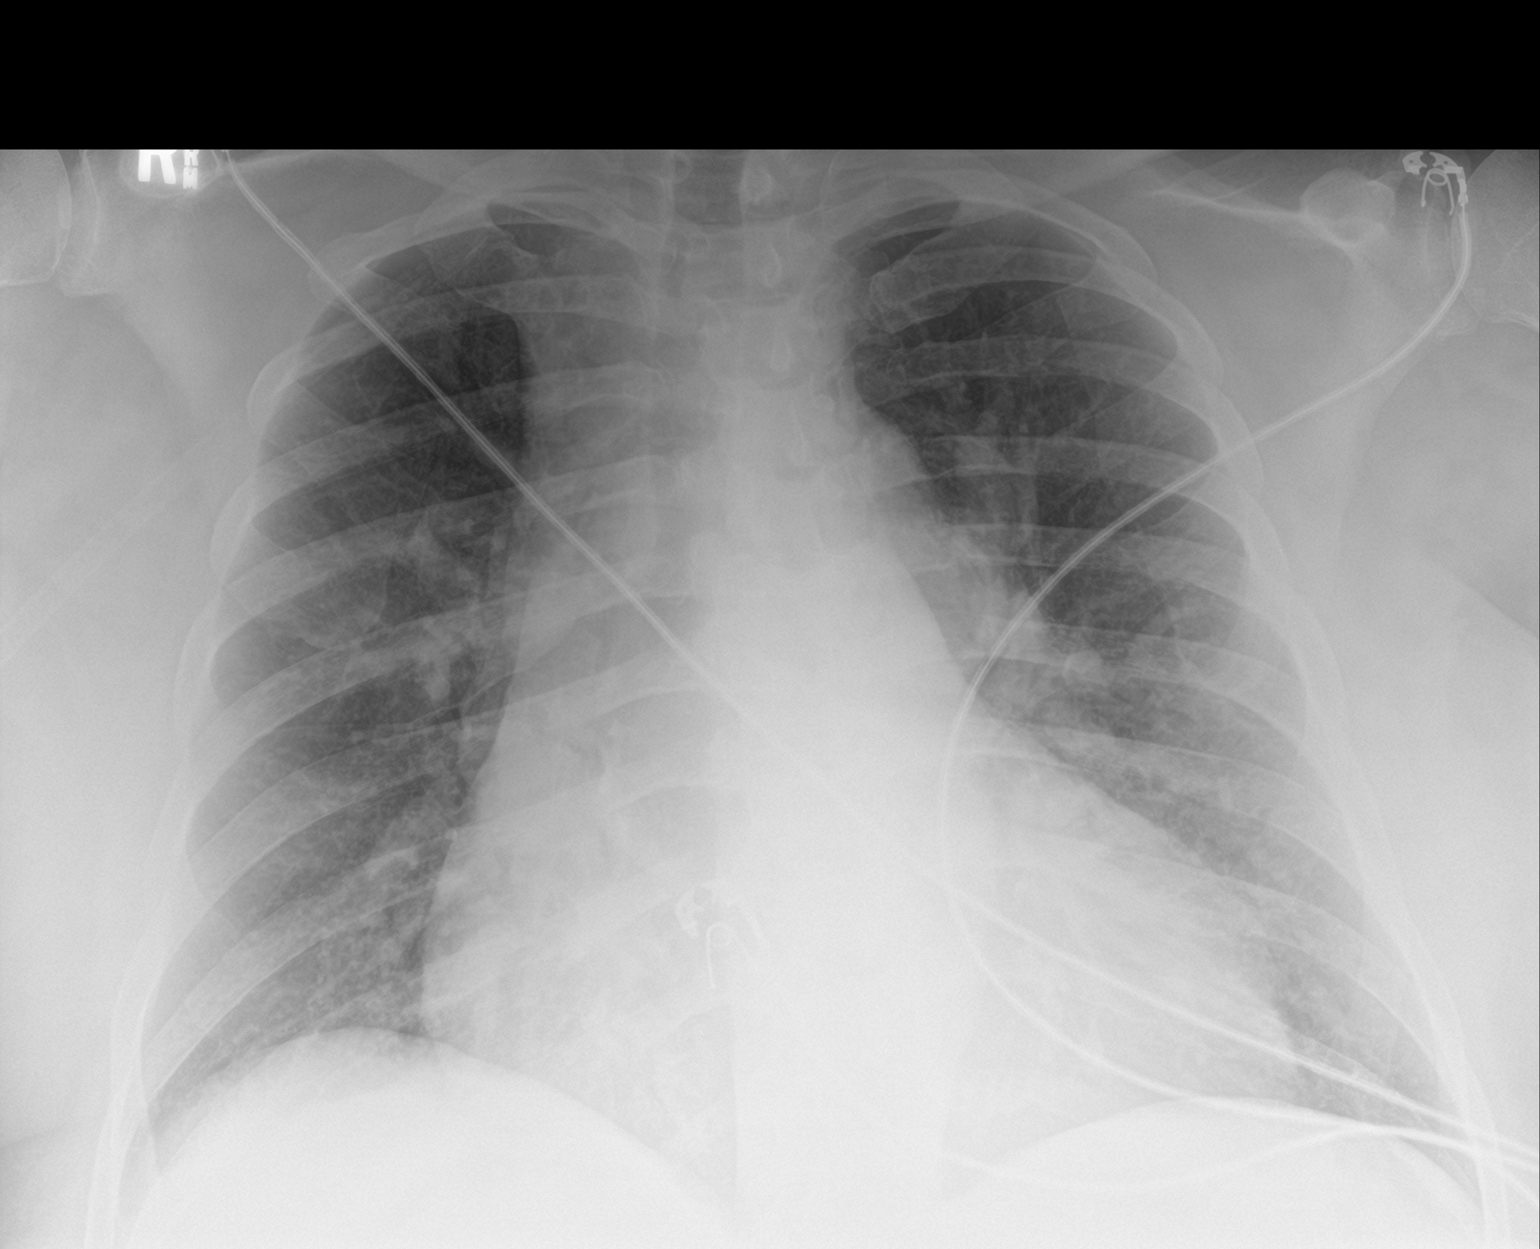

[chest lat]
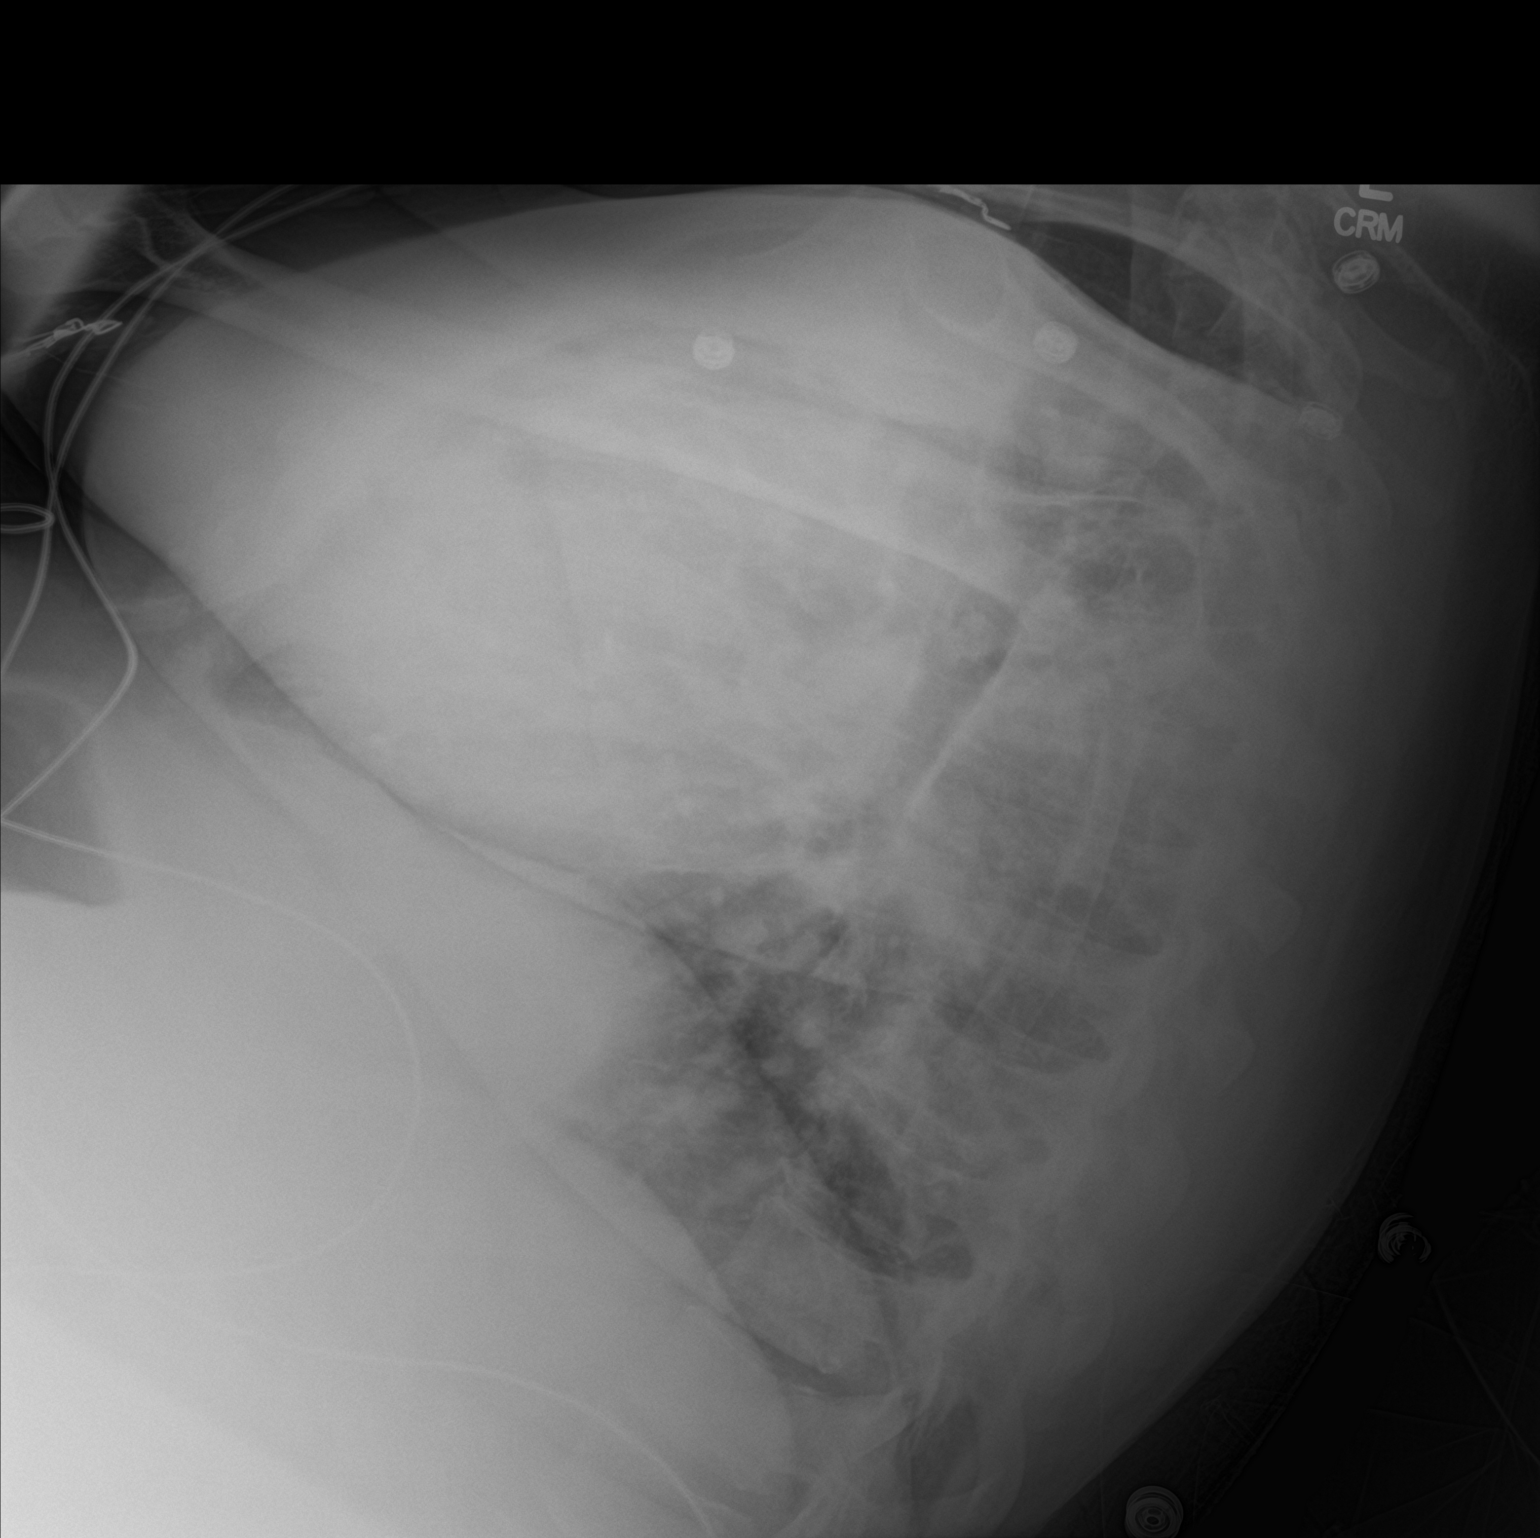

[2 of 2 positions shown; findings below may reference images not displayed]

FINDINGS: Bilateral mild interstitial thickening. No pleural effusion or
pneumothorax. No focal consolidation. Stable cardiomegaly. No acute
osseous abnormality.
IMPRESSION: Cardiomegaly with mild pulmonary vascular congestion.

## 2018-08-02 ENCOUNTER — Other Ambulatory Visit: Payer: Self-pay

## 2018-08-04 DIAGNOSIS — R0902 Hypoxemia: Secondary | ICD-10-CM | POA: Diagnosis not present

## 2018-08-04 DIAGNOSIS — I509 Heart failure, unspecified: Secondary | ICD-10-CM | POA: Diagnosis not present

## 2018-08-04 DIAGNOSIS — G4733 Obstructive sleep apnea (adult) (pediatric): Secondary | ICD-10-CM | POA: Diagnosis not present

## 2018-08-04 DIAGNOSIS — J9621 Acute and chronic respiratory failure with hypoxia: Secondary | ICD-10-CM | POA: Diagnosis not present

## 2018-08-05 ENCOUNTER — Encounter (INDEPENDENT_AMBULATORY_CARE_PROVIDER_SITE_OTHER): Payer: Medicare HMO | Admitting: Ophthalmology

## 2018-08-06 ENCOUNTER — Other Ambulatory Visit: Payer: Self-pay

## 2018-08-06 MED ORDER — ACCU-CHEK SOFTCLIX LANCETS MISC: 3 refills | Status: DC

## 2018-08-06 MED ORDER — ACCU-CHEK AVIVA PLUS W/DEVICE KIT: PACK | 0 refills | Status: AC

## 2018-08-06 MED ORDER — GLUCOSE BLOOD VI STRP: ORAL_STRIP | 3 refills | Status: DC

## 2018-08-06 MED ORDER — BD SWAB SINGLE USE REGULAR PADS: MEDICATED_PAD | 3 refills | Status: DC

## 2018-08-19 ENCOUNTER — Ambulatory Visit (INDEPENDENT_AMBULATORY_CARE_PROVIDER_SITE_OTHER): Payer: Medicare HMO | Admitting: Family Medicine

## 2018-08-19 ENCOUNTER — Telehealth: Payer: Self-pay | Admitting: *Deleted

## 2018-08-19 ENCOUNTER — Other Ambulatory Visit: Payer: Self-pay

## 2018-08-19 ENCOUNTER — Encounter: Payer: Self-pay | Admitting: Family Medicine

## 2018-08-19 VITALS — BP 118/62 | HR 79 | Temp 98.8°F | Ht 72.0 in | Wt 369.6 lb

## 2018-08-19 DIAGNOSIS — E1142 Type 2 diabetes mellitus with diabetic polyneuropathy: Secondary | ICD-10-CM | POA: Diagnosis not present

## 2018-08-19 DIAGNOSIS — Z5181 Encounter for therapeutic drug level monitoring: Secondary | ICD-10-CM | POA: Diagnosis not present

## 2018-08-19 DIAGNOSIS — J9611 Chronic respiratory failure with hypoxia: Secondary | ICD-10-CM | POA: Diagnosis not present

## 2018-08-19 DIAGNOSIS — R911 Solitary pulmonary nodule: Secondary | ICD-10-CM | POA: Diagnosis not present

## 2018-08-19 DIAGNOSIS — R918 Other nonspecific abnormal finding of lung field: Secondary | ICD-10-CM | POA: Diagnosis not present

## 2018-08-19 DIAGNOSIS — Z1211 Encounter for screening for malignant neoplasm of colon: Secondary | ICD-10-CM | POA: Diagnosis not present

## 2018-08-19 DIAGNOSIS — Z7901 Long term (current) use of anticoagulants: Secondary | ICD-10-CM | POA: Diagnosis not present

## 2018-08-19 LAB — POCT GLYCOSYLATED HEMOGLOBIN (HGB A1C): HbA1c, POC (controlled diabetic range): 6.7 % (ref 0.0–7.0)

## 2018-08-19 LAB — POCT INR: INR: 3.1 — AB (ref 2.0–3.0)

## 2018-08-19 MED ORDER — GABAPENTIN 100 MG PO CAPS: 100.0000 mg | ORAL_CAPSULE | Freq: Three times a day (TID) | ORAL | 0 refills | Status: DC

## 2018-08-19 MED ORDER — WARFARIN SODIUM 5 MG PO TABS: 7.5000 mg | ORAL_TABLET | Freq: Every day | ORAL | 0 refills | Status: DC

## 2018-08-19 NOTE — Assessment & Plan Note (Signed)
Discussed prior finding with patient and he consents to follow-up CT at [facility].  He knows he needs to call and schedule an appointment.  Orders placed

## 2018-08-19 NOTE — Progress Notes (Addendum)
Subjective:  Roberto [name] is a 57 y.o. male who presents to the [facility] today with a chief complaint of right hand pain.   HPI: Patient with history of right hand pain.  It hurts when he tries to use his walker and sometimes with flexion and has been worse with cold weather.  He says a recent orthopedic doctor visit resulted in some lab work that he was told was very high and a referral to rheumatology.  He does not have those labs in epic and will fill out an ROI.  His rheumatology appointment is next month.  He is wearing a brace on that hand but no injuries.   Last 2 INR labs are 3.0 and 3.1 respectively.  Discussed current warfarin plan with lab manager.  Current dosing is 10 mg daily.  Patient has traditionally been difficult to control and very sensitive to changes in his INR  Patient with chronic respiratory failure (OSA, morbid obesity) requiring 24/7 O2 and cpap at night.  We have had multiple discussions since admitting him to the hospital and assuming his primary care about the need for his chronic O2 and cpap use.  We will be re-ordering whenever machine has issues as this will be a lifelong need for him.  Objective:  Physical Exam: BP 118/62   Pulse 79   Temp 98.8 F (37.1 C) (Oral)   Ht 6' (1.829 m)   Wt (!) 369 lb 9.6 oz (167.6 kg)   SpO2 (!) 89% Comment: _0   BMI 50.13 kg/m   Gen: NAD, resting comfortably w/ chronic O2 CV: RRR with no murmurs appreciated Pulm: NWOB, CTAB with no crackles, wheezes, or rhonchi GI: Normal bowel sounds present. Soft, Nontender, Nondistended. MSK: no edema, cyanosis, or clubbing noted.  Right hand in rubber wrist support, tender to flexing of fist but no gross abnormality. Skin: warm, dry Neuro: grossly normal, moves all extremities Psych: Normal affect and thought content  No results found for this or any previous visit (from the past 72 hour(s)).   Assessment/Plan:  Anticoagulation management encounter Last 2 INR labs are 3.0 and  3.1 respectively.  Discussed current warfarin plan with lab manager.  Current dosing is 10 mg daily.  New plan will be to prescribe 5 mg tablets.  Patient will take 10 mg 6 days/week and on the seventh day will take 1-1/2 5 mg tablets for a total dose of 7.5 mg.  This will be a less than 5% change as this patient has been traditionally sensitive to Coumadin dosing  Screen for colon cancer Discussed need for colon cancer screening with patient.  Offered both colonoscopy referral and Cologuard patient elects for Cologuard  Type 2 diabetes mellitus with diabetic polyneuropathy, without long-term current use of insulin ([place]) Discussed with patient that A1c seems to be stable at 6.6.  He says he is recently stopped taking metformin due to concerns for gastrointestinal difficulty.  We will recheck in 3 months to see if this and back to his ability to control diabetes.  He says his blood sugars have been stable  Solitary pulmonary nodule Discussed prior finding with patient and he consents to follow-up CT at [facility].  He knows he needs to call and schedule an appointment.  Orders placed  Chronic respiratory failure with hypoxia ([place]) Patient with chronic respiratory failure (OSA, morbid obesity) requiring 24/7 O2 and cpap at night.  We have had multiple discussions since admitting him to the hospital and assuming his primary care  about the need for his chronic O2 and cpap use.  We will be re-ordering whenever machine has issues as this will be a lifelong need for him.    Sherene Sires, DO FAMILY MEDICINE RESIDENT - PGY2 09/03/2018 3:48 PM

## 2018-08-19 NOTE — Patient Instructions (Signed)
It was a pleasure to see you today! Thank you for choosing [facility] for your primary care. Roberto [name] was seen for arm pain and coumadin dosing. Come back to the clinic for a lab check in 3weeks, and go to the emergency room if you have any life threatening symptoms    We discussed current warfarin plan with lab manager.  Current dosing was 10 mg daily.  New plan will be to prescribe 5 mg tablets.  You will take 10 mg 6 days/week and on the seventh day will take  a total dose of 7.5 mg.  This will be a less than 5% change as you have been traditionally sensitive to Coumadin dosing  We are also prescribing you gabapentin for the nerve pain you are talking about in your right hand.  Please start this on a daily he will be at home as it makes some patients drowsy to be getting dose is 100 mg 3 times per day.  If you take this for a week without drowsiness and your pain does not get better you can double the dose to 200 mg 3 times per day please do not go above this without discussing it with me.  We have also ordering a Cologuard test for you to test for colon cancer, it should come in the mail soon.      Please bring all your medications to every doctors visit   Sign up for My Chart to have easy access to your labs results, and communication with your Primary care physician.     Please check-out at the front desk before leaving the clinic.     Best,  Dr. Sherene Sires FAMILY MEDICINE RESIDENT - PGY2 08/19/2018 9:13 AM

## 2018-08-19 NOTE — Assessment & Plan Note (Signed)
Last 2 INR labs are 3.0 and 3.1 respectively.  Discussed current warfarin plan with lab manager.  Current dosing is 10 mg daily.  New plan will be to prescribe 5 mg tablets.  Patient will take 10 mg 6 days/week and on the seventh day will take 1-1/2 5 mg tablets for a total dose of 7.5 mg.  This will be a less than 5% change as this patient has been traditionally sensitive to Coumadin dosing

## 2018-08-19 NOTE — Assessment & Plan Note (Signed)
Discussed need for colon cancer screening with patient.  Offered both colonoscopy referral and Cologuard patient elects for Cologuard

## 2018-08-19 NOTE — Assessment & Plan Note (Signed)
Discussed with patient that A1c seems to be stable at 6.6.  He says he is recently stopped taking metformin due to concerns for gastrointestinal difficulty.  We will recheck in 3 months to see if this and back to his ability to control diabetes.  He says his blood sugars have been stable

## 2018-08-19 NOTE — Telephone Encounter (Signed)
Release of information form faxed to [facility] [place]. Katharina Caper, Veena Sturgess D, CMA d

## 2018-08-27 ENCOUNTER — Telehealth: Payer: Self-pay | Admitting: Family Medicine

## 2018-08-27 DIAGNOSIS — J9611 Chronic respiratory failure with hypoxia: Secondary | ICD-10-CM

## 2018-08-27 NOTE — Telephone Encounter (Signed)
Pt called back concerning this colonoscopy. Pt said he spoke with Dr. Criss Rosales at his visit on 11-25 and Dr. Criss Rosales said something should get mailed out to this patient for the colonoscopy. Pt has not received this in the mail yet. Pt would like to get this colonoscopy and for a referral be placed if need be.   Pt also said [facility] called him saying his CPAP machine is up for renewal. Pt has had his for 5-7 years and it's supposed to get replaced after 5. Please send in an order to [facility] for this pt to get a new CPAP. Please advise.

## 2018-08-27 NOTE — Telephone Encounter (Signed)
Attempted to contact pt to discuss overdue COL and their options; unable to reach and eventually sent to voicemail, but did not leave a message. -[place]

## 2018-08-28 NOTE — Addendum Note (Signed)
Addended by: Coral Else on: 08/28/2018 05:38 PM   Modules accepted: Orders

## 2018-08-28 NOTE — Telephone Encounter (Signed)
DME order placed for new CPAP  cologuard order was placed on patient's last visit, he is not getting a full colonoscopy.  Robert in lab has been notified.  -Dr. Criss Rosales

## 2018-08-30 DIAGNOSIS — R0600 Dyspnea, unspecified: Secondary | ICD-10-CM | POA: Diagnosis not present

## 2018-08-30 DIAGNOSIS — I509 Heart failure, unspecified: Secondary | ICD-10-CM | POA: Diagnosis not present

## 2018-08-30 NOTE — Telephone Encounter (Signed)
Community message sent for CPAP order and also faxed to [facility] as well. Katharina Caper, April D, [place]

## 2018-09-03 ENCOUNTER — Telehealth: Payer: Self-pay | Admitting: *Deleted

## 2018-09-03 DIAGNOSIS — G4733 Obstructive sleep apnea (adult) (pediatric): Secondary | ICD-10-CM | POA: Diagnosis not present

## 2018-09-03 DIAGNOSIS — R0902 Hypoxemia: Secondary | ICD-10-CM | POA: Diagnosis not present

## 2018-09-03 DIAGNOSIS — J9621 Acute and chronic respiratory failure with hypoxia: Secondary | ICD-10-CM | POA: Diagnosis not present

## 2018-09-03 DIAGNOSIS — I509 Heart failure, unspecified: Secondary | ICD-10-CM | POA: Diagnosis not present

## 2018-09-03 NOTE — Telephone Encounter (Signed)
Contacted pt to inform him that his CT is scheduled for 09/06/18 _0 :30pm with a 4:15pm arrival at [facility] 1st floor radiology. Katharina Caper, Darice Vicario D, [place]

## 2018-09-03 NOTE — Assessment & Plan Note (Signed)
Patient with chronic respiratory failure (OSA, morbid obesity) requiring 24/7 O2 and cpap at night.  We have had multiple discussions since admitting him to the hospital and assuming his primary care about the need for his chronic O2 and cpap use.  We will be re-ordering whenever machine has issues as this will be a lifelong need for him.

## 2018-09-06 ENCOUNTER — Ambulatory Visit (HOSPITAL_COMMUNITY): Payer: Medicare HMO

## 2018-09-10 ENCOUNTER — Other Ambulatory Visit: Payer: Self-pay | Admitting: Family Medicine

## 2018-09-10 DIAGNOSIS — J9611 Chronic respiratory failure with hypoxia: Secondary | ICD-10-CM

## 2018-09-10 NOTE — Telephone Encounter (Signed)
[facility] called nurse line stating they need a pressure setting for the CPAP.   You can send a community message to [facility], once this is added to the DME order.   Call back for questions [phone].

## 2018-09-10 NOTE — Progress Notes (Signed)
Adding setting to CPAP order -Dr. Criss Rosales

## 2018-09-12 ENCOUNTER — Other Ambulatory Visit: Payer: Medicare HMO

## 2018-09-12 ENCOUNTER — Ambulatory Visit (HOSPITAL_COMMUNITY)
Admission: RE | Admit: 2018-09-12 | Discharge: 2018-09-12 | Disposition: A | Discharge status: Home / Self Care | Payer: Medicare HMO | Source: Ambulatory Visit | Attending: Family Medicine | Admitting: Family Medicine

## 2018-09-12 DIAGNOSIS — R918 Other nonspecific abnormal finding of lung field: Secondary | ICD-10-CM | POA: Diagnosis not present

## 2018-09-12 DIAGNOSIS — R911 Solitary pulmonary nodule: Secondary | ICD-10-CM | POA: Diagnosis not present

## 2018-09-12 LAB — POCT INR: INR: 2.3 (ref 2.0–3.0)

## 2018-09-13 ENCOUNTER — Encounter: Payer: Self-pay | Admitting: Family Medicine

## 2018-09-13 DIAGNOSIS — R911 Solitary pulmonary nodule: Secondary | ICD-10-CM

## 2018-09-16 DIAGNOSIS — Z1212 Encounter for screening for malignant neoplasm of rectum: Secondary | ICD-10-CM | POA: Diagnosis not present

## 2018-09-16 DIAGNOSIS — Z1211 Encounter for screening for malignant neoplasm of colon: Secondary | ICD-10-CM | POA: Diagnosis not present

## 2018-09-16 LAB — COLOGUARD: COLOGUARD: NEGATIVE

## 2018-09-23 DIAGNOSIS — G4733 Obstructive sleep apnea (adult) (pediatric): Secondary | ICD-10-CM | POA: Diagnosis not present

## 2018-09-30 ENCOUNTER — Telehealth: Payer: Self-pay | Admitting: *Deleted

## 2018-09-30 DIAGNOSIS — I509 Heart failure, unspecified: Secondary | ICD-10-CM | POA: Diagnosis not present

## 2018-09-30 DIAGNOSIS — R0600 Dyspnea, unspecified: Secondary | ICD-10-CM | POA: Diagnosis not present

## 2018-09-30 NOTE — Telephone Encounter (Signed)
Pt called and wanted to know if the CT showed "fluid on his lungs" because he is short of breath with exercise and thinks he has gained weight.  Read over CT and advised I did not see anything about fluid but would send to MD to double check.   Attempted to get pt to make an appt for SOB but he wants to see what Dr. Criss Rosales says first.   Pt thinks that the SOB and weight gain maybe from the gabapentin, which he states has really helped his legs and arms.  Will forward to MD. [place], Salome Spotted, [place]

## 2018-10-01 NOTE — Telephone Encounter (Signed)
Spoke to pt and gave him the information provided from Dr. Criss Rosales. Pt said he doesn't want to stop the Gabapentin. Pt was offered an appt but declined. Pt will call if he wants to schedule and appt. Roberto [name], CMA

## 2018-10-03 ENCOUNTER — Other Ambulatory Visit: Payer: Self-pay | Admitting: *Deleted

## 2018-10-03 ENCOUNTER — Encounter: Payer: Self-pay | Admitting: Family Medicine

## 2018-10-03 DIAGNOSIS — Z1211 Encounter for screening for malignant neoplasm of colon: Secondary | ICD-10-CM

## 2018-10-04 DIAGNOSIS — J9621 Acute and chronic respiratory failure with hypoxia: Secondary | ICD-10-CM | POA: Diagnosis not present

## 2018-10-15 ENCOUNTER — Other Ambulatory Visit: Payer: Self-pay

## 2018-10-15 NOTE — Telephone Encounter (Signed)
This was previously discontinued by PCP on 01/2018. He should come in for follow up if he has further pain to be evaluated. He can be seen by PCP or [facility].   Dalphine Handing, PGY-2 [facility] 10/15/2018 12:13 PM

## 2018-10-15 NOTE — Telephone Encounter (Signed)
Patient called and asked for a refill on muscle relaxant previously prescribed.  [facility] in [place].  Danley Danker, RN [facility] RN)

## 2018-10-16 NOTE — Telephone Encounter (Signed)
Contacted pt and gave him the below information and scheduled him an appointment on [facility] for 10/17/18. Katharina Caper,  D, [place]

## 2018-10-17 ENCOUNTER — Ambulatory Visit: Payer: Medicare HMO

## 2018-10-24 DIAGNOSIS — G4733 Obstructive sleep apnea (adult) (pediatric): Secondary | ICD-10-CM | POA: Diagnosis not present

## 2018-10-31 DIAGNOSIS — I509 Heart failure, unspecified: Secondary | ICD-10-CM | POA: Diagnosis not present

## 2018-10-31 DIAGNOSIS — R0600 Dyspnea, unspecified: Secondary | ICD-10-CM | POA: Diagnosis not present

## 2018-11-04 DIAGNOSIS — J9621 Acute and chronic respiratory failure with hypoxia: Secondary | ICD-10-CM | POA: Diagnosis not present

## 2018-11-11 ENCOUNTER — Other Ambulatory Visit: Payer: Self-pay

## 2018-11-11 DIAGNOSIS — E1142 Type 2 diabetes mellitus with diabetic polyneuropathy: Secondary | ICD-10-CM

## 2018-11-11 MED ORDER — GABAPENTIN 100 MG PO CAPS: 100.0000 mg | ORAL_CAPSULE | Freq: Three times a day (TID) | ORAL | 0 refills | Status: DC

## 2018-11-11 NOTE — Telephone Encounter (Signed)
Pt called nurse line requesting a refill on Gabapentin. Please advise.

## 2018-11-14 ENCOUNTER — Other Ambulatory Visit: Payer: Self-pay

## 2018-11-14 DIAGNOSIS — E1142 Type 2 diabetes mellitus with diabetic polyneuropathy: Secondary | ICD-10-CM

## 2018-11-14 MED ORDER — GABAPENTIN 100 MG PO CAPS: 100.0000 mg | ORAL_CAPSULE | Freq: Three times a day (TID) | ORAL | 0 refills | Status: DC

## 2018-11-23 DIAGNOSIS — G4733 Obstructive sleep apnea (adult) (pediatric): Secondary | ICD-10-CM | POA: Diagnosis not present

## 2018-11-29 DIAGNOSIS — R0600 Dyspnea, unspecified: Secondary | ICD-10-CM | POA: Diagnosis not present

## 2018-11-29 DIAGNOSIS — I509 Heart failure, unspecified: Secondary | ICD-10-CM | POA: Diagnosis not present

## 2018-12-03 DIAGNOSIS — R092 Respiratory arrest: Secondary | ICD-10-CM | POA: Diagnosis not present

## 2018-12-03 DIAGNOSIS — G4733 Obstructive sleep apnea (adult) (pediatric): Secondary | ICD-10-CM | POA: Diagnosis not present

## 2018-12-03 DIAGNOSIS — I509 Heart failure, unspecified: Secondary | ICD-10-CM | POA: Diagnosis not present

## 2018-12-03 DIAGNOSIS — J9621 Acute and chronic respiratory failure with hypoxia: Secondary | ICD-10-CM | POA: Diagnosis not present

## 2018-12-23 DIAGNOSIS — G4733 Obstructive sleep apnea (adult) (pediatric): Secondary | ICD-10-CM | POA: Diagnosis not present

## 2018-12-30 DIAGNOSIS — I509 Heart failure, unspecified: Secondary | ICD-10-CM | POA: Diagnosis not present

## 2018-12-30 DIAGNOSIS — R0600 Dyspnea, unspecified: Secondary | ICD-10-CM | POA: Diagnosis not present

## 2019-01-01 ENCOUNTER — Other Ambulatory Visit: Payer: Self-pay | Admitting: *Deleted

## 2019-01-01 DIAGNOSIS — I10 Essential (primary) hypertension: Secondary | ICD-10-CM

## 2019-01-01 MED ORDER — TORSEMIDE 20 MG PO TABS: 60.0000 mg | ORAL_TABLET | Freq: Every day | ORAL | 1 refills | Status: DC

## 2019-01-01 MED ORDER — AMLODIPINE BESYLATE 5 MG PO TABS: 5.0000 mg | ORAL_TABLET | Freq: Every day | ORAL | 1 refills | Status: DC

## 2019-01-01 MED ORDER — METFORMIN HCL 1000 MG PO TABS: 1000.0000 mg | ORAL_TABLET | Freq: Every day | ORAL | 3 refills | Status: DC

## 2019-01-01 MED ORDER — LISINOPRIL 10 MG PO TABS: 10.0000 mg | ORAL_TABLET | Freq: Every day | ORAL | 1 refills | Status: DC

## 2019-01-03 DIAGNOSIS — J9621 Acute and chronic respiratory failure with hypoxia: Secondary | ICD-10-CM | POA: Diagnosis not present

## 2019-01-03 DIAGNOSIS — G4733 Obstructive sleep apnea (adult) (pediatric): Secondary | ICD-10-CM | POA: Diagnosis not present

## 2019-01-03 DIAGNOSIS — I509 Heart failure, unspecified: Secondary | ICD-10-CM | POA: Diagnosis not present

## 2019-01-03 DIAGNOSIS — R0902 Hypoxemia: Secondary | ICD-10-CM | POA: Diagnosis not present

## 2019-01-06 ENCOUNTER — Other Ambulatory Visit: Payer: Self-pay

## 2019-01-06 DIAGNOSIS — Z7901 Long term (current) use of anticoagulants: Principal | ICD-10-CM

## 2019-01-06 DIAGNOSIS — Z5181 Encounter for therapeutic drug level monitoring: Secondary | ICD-10-CM

## 2019-01-06 DIAGNOSIS — I10 Essential (primary) hypertension: Secondary | ICD-10-CM

## 2019-01-06 MED ORDER — TORSEMIDE 20 MG PO TABS: 60.0000 mg | ORAL_TABLET | Freq: Every day | ORAL | 1 refills | Status: DC

## 2019-01-06 MED ORDER — WARFARIN SODIUM 5 MG PO TABS: 7.5000 mg | ORAL_TABLET | Freq: Every day | ORAL | 0 refills | Status: DC

## 2019-01-23 ENCOUNTER — Other Ambulatory Visit: Payer: Self-pay

## 2019-01-23 DIAGNOSIS — G4733 Obstructive sleep apnea (adult) (pediatric): Secondary | ICD-10-CM | POA: Diagnosis not present

## 2019-01-23 DIAGNOSIS — E1142 Type 2 diabetes mellitus with diabetic polyneuropathy: Secondary | ICD-10-CM

## 2019-01-24 MED ORDER — GABAPENTIN 100 MG PO CAPS: 100.0000 mg | ORAL_CAPSULE | Freq: Three times a day (TID) | ORAL | 0 refills | Status: DC

## 2019-01-27 ENCOUNTER — Other Ambulatory Visit: Payer: Self-pay | Admitting: Family Medicine

## 2019-01-27 DIAGNOSIS — Z5181 Encounter for therapeutic drug level monitoring: Secondary | ICD-10-CM

## 2019-01-27 DIAGNOSIS — Z7901 Long term (current) use of anticoagulants: Principal | ICD-10-CM

## 2019-01-28 NOTE — Telephone Encounter (Signed)
Refilled his warfarin but he should come for a lab visit to check his INR, looks like it hasn't been checked in 4 months. Could we help get him scheduled for this? Thanks!

## 2019-01-29 DIAGNOSIS — R0600 Dyspnea, unspecified: Secondary | ICD-10-CM | POA: Diagnosis not present

## 2019-01-29 DIAGNOSIS — I509 Heart failure, unspecified: Secondary | ICD-10-CM | POA: Diagnosis not present

## 2019-01-29 NOTE — Telephone Encounter (Signed)
Informed pt of below and he stated that right now he does not have transportation but should be getting it back sometime this week and will call and schedule an appointment to check INR. Robynn Marcel Zimmerman Rumple, CMA

## 2019-02-02 DIAGNOSIS — R0902 Hypoxemia: Secondary | ICD-10-CM | POA: Diagnosis not present

## 2019-02-02 DIAGNOSIS — G4733 Obstructive sleep apnea (adult) (pediatric): Secondary | ICD-10-CM | POA: Diagnosis not present

## 2019-02-02 DIAGNOSIS — J9621 Acute and chronic respiratory failure with hypoxia: Secondary | ICD-10-CM | POA: Diagnosis not present

## 2019-02-02 DIAGNOSIS — I509 Heart failure, unspecified: Secondary | ICD-10-CM | POA: Diagnosis not present

## 2019-02-12 DIAGNOSIS — G4733 Obstructive sleep apnea (adult) (pediatric): Secondary | ICD-10-CM | POA: Diagnosis not present

## 2019-02-19 ENCOUNTER — Other Ambulatory Visit: Payer: Self-pay | Admitting: Family Medicine

## 2019-02-19 DIAGNOSIS — Z5181 Encounter for therapeutic drug level monitoring: Secondary | ICD-10-CM

## 2019-02-22 DIAGNOSIS — G4733 Obstructive sleep apnea (adult) (pediatric): Secondary | ICD-10-CM | POA: Diagnosis not present

## 2019-03-01 DIAGNOSIS — I509 Heart failure, unspecified: Secondary | ICD-10-CM | POA: Diagnosis not present

## 2019-03-01 DIAGNOSIS — R0600 Dyspnea, unspecified: Secondary | ICD-10-CM | POA: Diagnosis not present

## 2019-03-05 DIAGNOSIS — R0902 Hypoxemia: Secondary | ICD-10-CM | POA: Diagnosis not present

## 2019-03-05 DIAGNOSIS — I509 Heart failure, unspecified: Secondary | ICD-10-CM | POA: Diagnosis not present

## 2019-03-05 DIAGNOSIS — J9621 Acute and chronic respiratory failure with hypoxia: Secondary | ICD-10-CM | POA: Diagnosis not present

## 2019-03-05 DIAGNOSIS — G4733 Obstructive sleep apnea (adult) (pediatric): Secondary | ICD-10-CM | POA: Diagnosis not present

## 2019-03-25 DIAGNOSIS — G4733 Obstructive sleep apnea (adult) (pediatric): Secondary | ICD-10-CM | POA: Diagnosis not present

## 2019-03-27 ENCOUNTER — Other Ambulatory Visit: Payer: Self-pay | Admitting: *Deleted

## 2019-03-27 DIAGNOSIS — E1142 Type 2 diabetes mellitus with diabetic polyneuropathy: Secondary | ICD-10-CM

## 2019-03-28 MED ORDER — GABAPENTIN 100 MG PO CAPS: 100.0000 mg | ORAL_CAPSULE | Freq: Three times a day (TID) | ORAL | 0 refills | Status: DC

## 2019-03-31 DIAGNOSIS — R0600 Dyspnea, unspecified: Secondary | ICD-10-CM | POA: Diagnosis not present

## 2019-03-31 DIAGNOSIS — I509 Heart failure, unspecified: Secondary | ICD-10-CM | POA: Diagnosis not present

## 2019-04-02 ENCOUNTER — Other Ambulatory Visit: Payer: Self-pay | Admitting: Family Medicine

## 2019-04-02 DIAGNOSIS — Z5181 Encounter for therapeutic drug level monitoring: Secondary | ICD-10-CM

## 2019-04-04 DIAGNOSIS — I509 Heart failure, unspecified: Secondary | ICD-10-CM | POA: Diagnosis not present

## 2019-04-04 DIAGNOSIS — J9621 Acute and chronic respiratory failure with hypoxia: Secondary | ICD-10-CM | POA: Diagnosis not present

## 2019-04-04 DIAGNOSIS — G4733 Obstructive sleep apnea (adult) (pediatric): Secondary | ICD-10-CM | POA: Diagnosis not present

## 2019-04-04 DIAGNOSIS — R0902 Hypoxemia: Secondary | ICD-10-CM | POA: Diagnosis not present

## 2019-04-08 ENCOUNTER — Other Ambulatory Visit: Payer: Self-pay

## 2019-04-08 ENCOUNTER — Other Ambulatory Visit (INDEPENDENT_AMBULATORY_CARE_PROVIDER_SITE_OTHER): Payer: Medicare HMO

## 2019-04-08 DIAGNOSIS — E1142 Type 2 diabetes mellitus with diabetic polyneuropathy: Secondary | ICD-10-CM

## 2019-04-08 DIAGNOSIS — I4891 Unspecified atrial fibrillation: Secondary | ICD-10-CM | POA: Diagnosis not present

## 2019-04-09 ENCOUNTER — Telehealth: Payer: Self-pay

## 2019-04-09 LAB — POCT GLYCOSYLATED HEMOGLOBIN (HGB A1C): HbA1c, POC (controlled diabetic range): 7.3 % — AB (ref 0.0–7.0)

## 2019-04-09 LAB — POCT INR: INR: 1.7 — AB (ref 2.0–3.0)

## 2019-04-09 NOTE — Telephone Encounter (Signed)
Clinical info completed on Disablilty Placard form.  Place form in Dr. Perley Jain box for completion.  Ottis Stain, CMA

## 2019-04-14 IMAGING — CR DG CHEST 2V
2 series · 2 of 2 positions shown · non-contrast
Comparison: 05/08/2017

CLINICAL DATA: CHF patient oxygen dependent. Increased leg swelling
and abdominal distention. Increased shortness of breath.

EXAM:
CHEST - 2 VIEW

[chest lat]
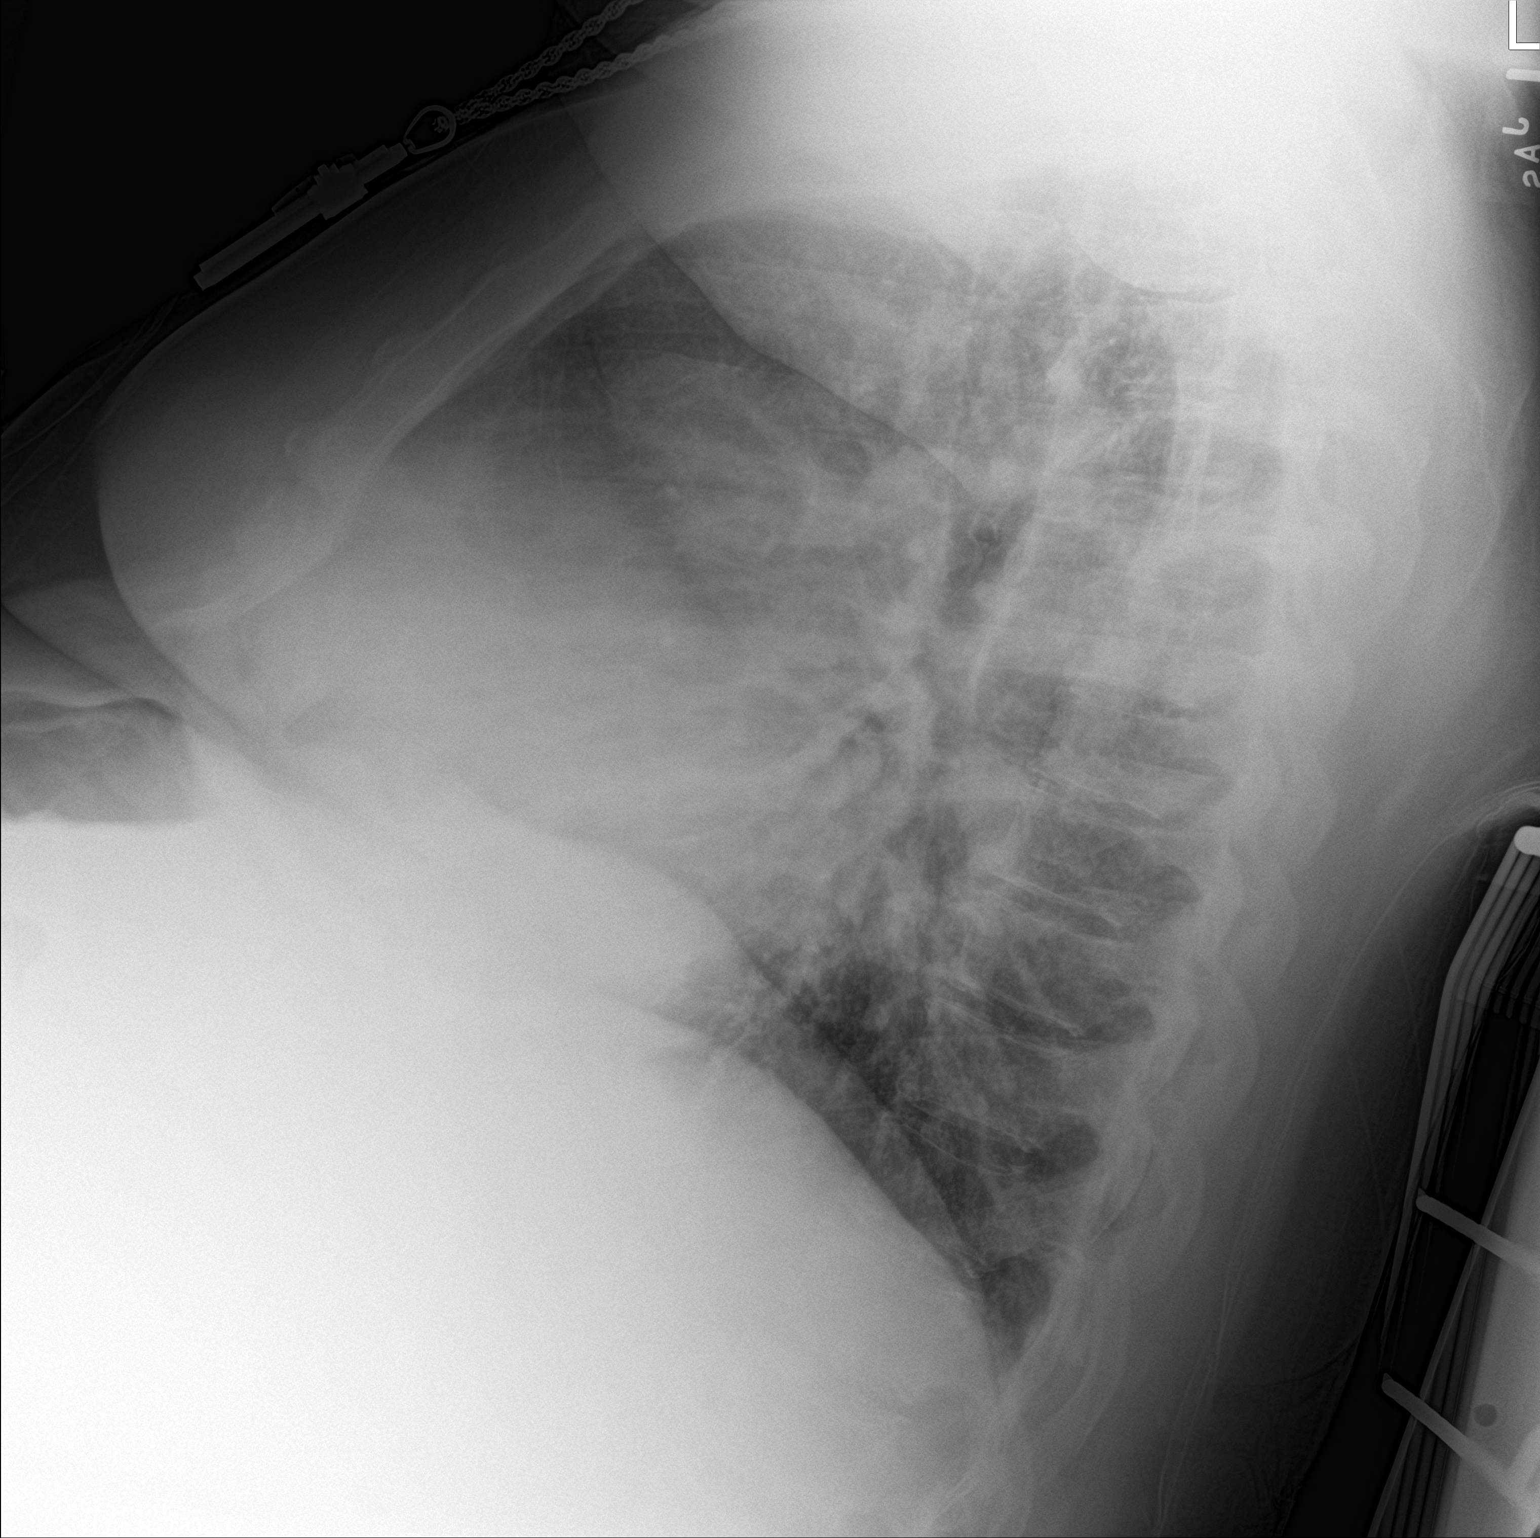

[chest ap]
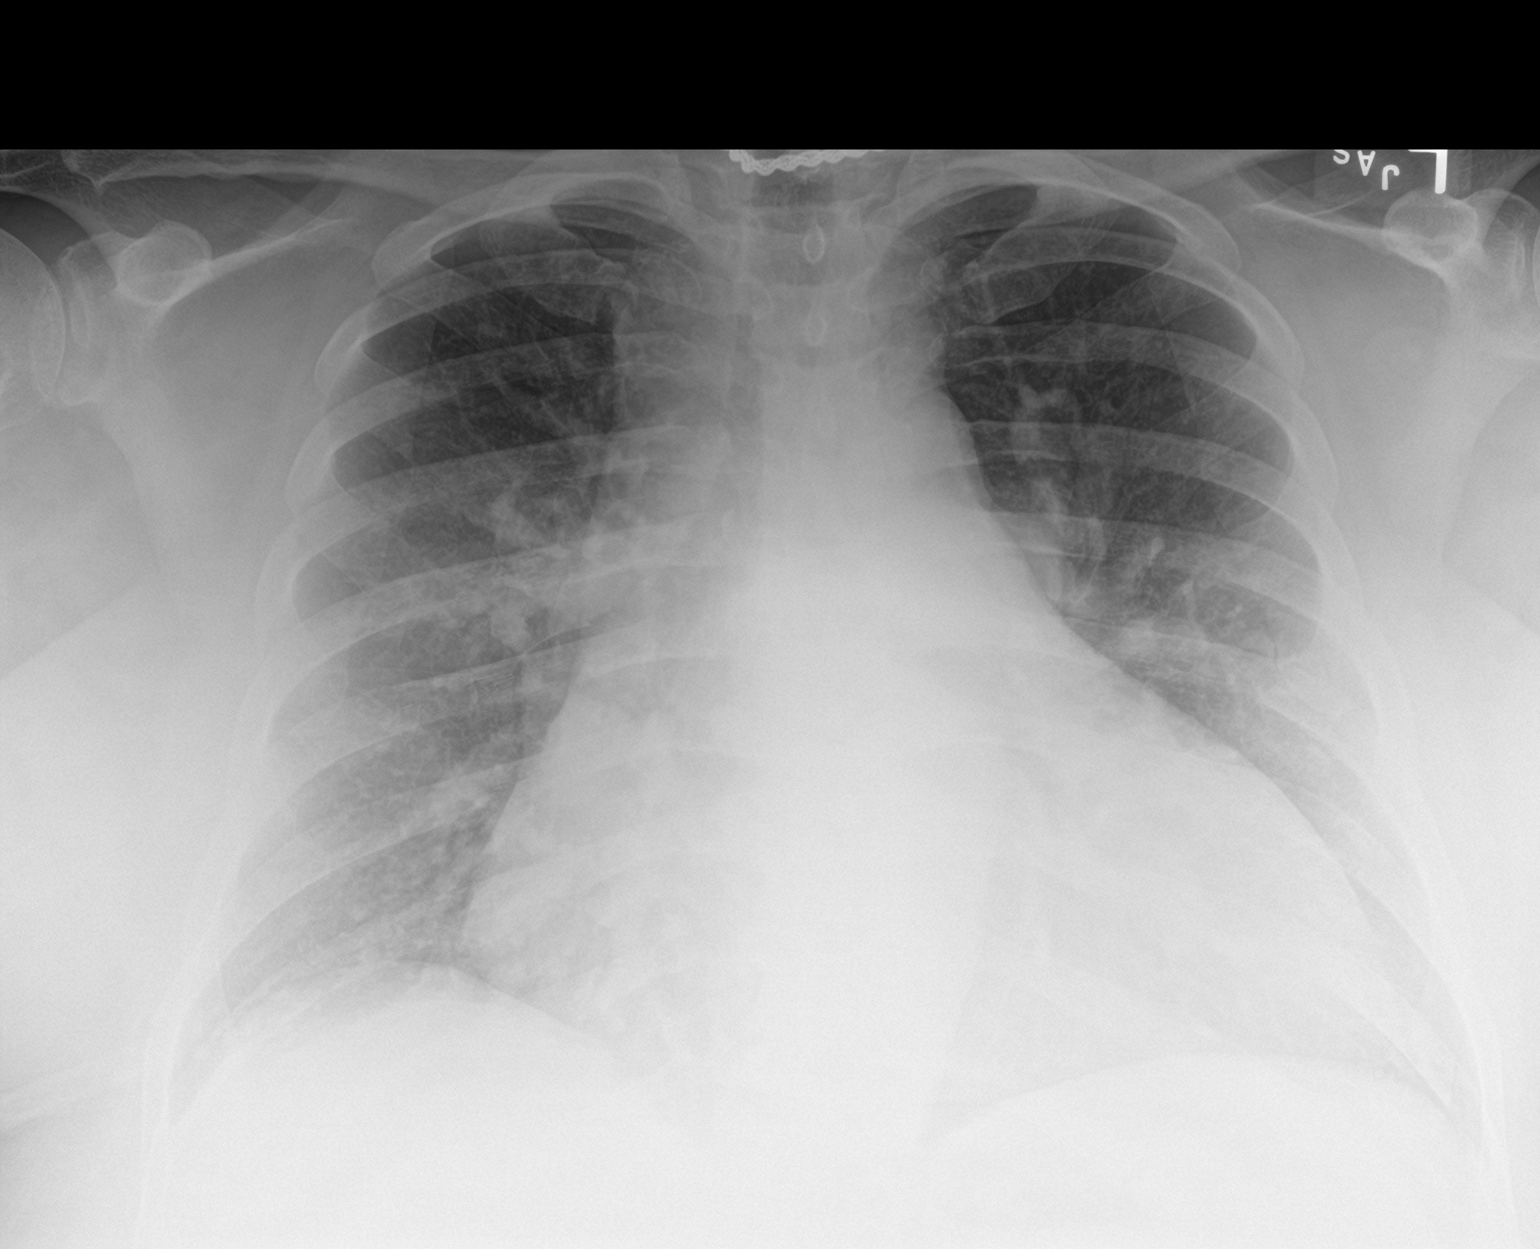

[2 of 2 positions shown; findings below may reference images not displayed]

FINDINGS: Shallow inspiration. Prominent cardiac enlargement with mild
pulmonary vascular congestion. Mild hazy basilar infiltrates likely
represent early edema. No pleural effusions. No pneumothorax. No
focal consolidation. Mediastinal contours appear intact.
IMPRESSION: Cardiac enlargement with pulmonary vascular congestion and mild
basilar edema.

## 2019-04-21 NOTE — Telephone Encounter (Signed)
Copy made for batch scanning. Original mailed to patients home, per patients request.

## 2019-04-22 ENCOUNTER — Other Ambulatory Visit: Payer: Medicare HMO

## 2019-04-24 DIAGNOSIS — G4733 Obstructive sleep apnea (adult) (pediatric): Secondary | ICD-10-CM | POA: Diagnosis not present

## 2019-04-25 ENCOUNTER — Ambulatory Visit (INDEPENDENT_AMBULATORY_CARE_PROVIDER_SITE_OTHER): Payer: Medicare HMO | Admitting: *Deleted

## 2019-04-25 ENCOUNTER — Other Ambulatory Visit: Payer: Self-pay

## 2019-04-25 DIAGNOSIS — I824Y2 Acute embolism and thrombosis of unspecified deep veins of left proximal lower extremity: Secondary | ICD-10-CM

## 2019-04-25 DIAGNOSIS — I82412 Acute embolism and thrombosis of left femoral vein: Secondary | ICD-10-CM

## 2019-04-25 DIAGNOSIS — I4891 Unspecified atrial fibrillation: Secondary | ICD-10-CM | POA: Diagnosis not present

## 2019-04-25 LAB — POCT INR: INR: 1.6 — AB (ref 2.0–3.0)

## 2019-05-01 DIAGNOSIS — I509 Heart failure, unspecified: Secondary | ICD-10-CM | POA: Diagnosis not present

## 2019-05-01 DIAGNOSIS — R0600 Dyspnea, unspecified: Secondary | ICD-10-CM | POA: Diagnosis not present

## 2019-05-02 ENCOUNTER — Other Ambulatory Visit: Payer: Medicare HMO

## 2019-05-05 ENCOUNTER — Other Ambulatory Visit: Payer: Medicare HMO

## 2019-05-05 DIAGNOSIS — I509 Heart failure, unspecified: Secondary | ICD-10-CM | POA: Diagnosis not present

## 2019-05-05 DIAGNOSIS — G4733 Obstructive sleep apnea (adult) (pediatric): Secondary | ICD-10-CM | POA: Diagnosis not present

## 2019-05-05 DIAGNOSIS — R0902 Hypoxemia: Secondary | ICD-10-CM | POA: Diagnosis not present

## 2019-05-05 DIAGNOSIS — J9621 Acute and chronic respiratory failure with hypoxia: Secondary | ICD-10-CM | POA: Diagnosis not present

## 2019-05-13 DIAGNOSIS — G4733 Obstructive sleep apnea (adult) (pediatric): Secondary | ICD-10-CM | POA: Diagnosis not present

## 2019-05-20 ENCOUNTER — Other Ambulatory Visit: Payer: Self-pay | Admitting: Family Medicine

## 2019-05-20 DIAGNOSIS — Z5181 Encounter for therapeutic drug level monitoring: Secondary | ICD-10-CM

## 2019-05-20 DIAGNOSIS — E1142 Type 2 diabetes mellitus with diabetic polyneuropathy: Secondary | ICD-10-CM

## 2019-05-20 DIAGNOSIS — Z7901 Long term (current) use of anticoagulants: Secondary | ICD-10-CM

## 2019-05-21 ENCOUNTER — Other Ambulatory Visit: Payer: Self-pay | Admitting: Family Medicine

## 2019-05-25 DIAGNOSIS — G4733 Obstructive sleep apnea (adult) (pediatric): Secondary | ICD-10-CM | POA: Diagnosis not present

## 2019-05-30 ENCOUNTER — Other Ambulatory Visit: Payer: Self-pay | Admitting: Family Medicine

## 2019-06-01 DIAGNOSIS — R0600 Dyspnea, unspecified: Secondary | ICD-10-CM | POA: Diagnosis not present

## 2019-06-01 DIAGNOSIS — I509 Heart failure, unspecified: Secondary | ICD-10-CM | POA: Diagnosis not present

## 2019-06-05 DIAGNOSIS — I509 Heart failure, unspecified: Secondary | ICD-10-CM | POA: Diagnosis not present

## 2019-06-05 DIAGNOSIS — R0902 Hypoxemia: Secondary | ICD-10-CM | POA: Diagnosis not present

## 2019-06-05 DIAGNOSIS — J9621 Acute and chronic respiratory failure with hypoxia: Secondary | ICD-10-CM | POA: Diagnosis not present

## 2019-06-05 DIAGNOSIS — G4733 Obstructive sleep apnea (adult) (pediatric): Secondary | ICD-10-CM | POA: Diagnosis not present

## 2019-06-06 ENCOUNTER — Other Ambulatory Visit: Payer: Self-pay

## 2019-06-06 DIAGNOSIS — E1142 Type 2 diabetes mellitus with diabetic polyneuropathy: Secondary | ICD-10-CM

## 2019-06-06 MED ORDER — GABAPENTIN 100 MG PO CAPS: ORAL_CAPSULE | ORAL | 0 refills | Status: DC

## 2019-06-16 ENCOUNTER — Other Ambulatory Visit: Payer: Self-pay | Admitting: Family Medicine

## 2019-06-16 DIAGNOSIS — Z7901 Long term (current) use of anticoagulants: Secondary | ICD-10-CM

## 2019-06-16 DIAGNOSIS — Z5181 Encounter for therapeutic drug level monitoring: Secondary | ICD-10-CM

## 2019-06-17 NOTE — Telephone Encounter (Signed)
Spoke to pt. He is having a hard time with his knees right now. He will call back tomorrow and schedule an appt for Friday. Ottis Stain, [place]

## 2019-06-17 NOTE — Telephone Encounter (Signed)
Please call patient, " Dr. Criss Rosales has refilled your warfarin but he is concerned that we have not had a good INR check in a while.  Please come into the lab and get this done as soon as you can."  -Dr. Criss Rosales

## 2019-06-25 DIAGNOSIS — G4733 Obstructive sleep apnea (adult) (pediatric): Secondary | ICD-10-CM | POA: Diagnosis not present

## 2019-07-01 DIAGNOSIS — R0600 Dyspnea, unspecified: Secondary | ICD-10-CM | POA: Diagnosis not present

## 2019-07-01 DIAGNOSIS — I509 Heart failure, unspecified: Secondary | ICD-10-CM | POA: Diagnosis not present

## 2019-07-05 DIAGNOSIS — G4733 Obstructive sleep apnea (adult) (pediatric): Secondary | ICD-10-CM | POA: Diagnosis not present

## 2019-07-05 DIAGNOSIS — J9621 Acute and chronic respiratory failure with hypoxia: Secondary | ICD-10-CM | POA: Diagnosis not present

## 2019-07-05 DIAGNOSIS — R0902 Hypoxemia: Secondary | ICD-10-CM | POA: Diagnosis not present

## 2019-07-05 DIAGNOSIS — I509 Heart failure, unspecified: Secondary | ICD-10-CM | POA: Diagnosis not present

## 2019-07-08 ENCOUNTER — Other Ambulatory Visit: Payer: Self-pay | Admitting: Family Medicine

## 2019-07-08 DIAGNOSIS — Z5181 Encounter for therapeutic drug level monitoring: Secondary | ICD-10-CM

## 2019-07-09 NOTE — Telephone Encounter (Signed)
Pt has appointment scheduled on 07/14/2019. [name], CMA

## 2019-07-09 NOTE — Telephone Encounter (Signed)
Please call patient, " Dr. Criss Rosales has refilled your warfarin as this is a critical medicine to you, but your INR has been traditionally hard to control you have not been checked in 2 months now.  It is very important you come in for an INR check as soon as possible."  Dr. Criss Rosales

## 2019-07-09 NOTE — Telephone Encounter (Signed)
LVM for a return call to our office to schedule a lab visit for INR check. Ottis Stain, CMA

## 2019-07-14 ENCOUNTER — Ambulatory Visit (INDEPENDENT_AMBULATORY_CARE_PROVIDER_SITE_OTHER): Payer: Medicare HMO | Admitting: Family Medicine

## 2019-07-14 ENCOUNTER — Other Ambulatory Visit: Payer: Self-pay

## 2019-07-14 ENCOUNTER — Encounter: Payer: Self-pay | Admitting: Family Medicine

## 2019-07-14 VITALS — BP 112/70 | HR 88 | Wt 399.2 lb

## 2019-07-14 DIAGNOSIS — M179 Osteoarthritis of knee, unspecified: Secondary | ICD-10-CM | POA: Insufficient documentation

## 2019-07-14 DIAGNOSIS — M1710 Unilateral primary osteoarthritis, unspecified knee: Secondary | ICD-10-CM | POA: Insufficient documentation

## 2019-07-14 DIAGNOSIS — M170 Bilateral primary osteoarthritis of knee: Secondary | ICD-10-CM

## 2019-07-14 DIAGNOSIS — Z7901 Long term (current) use of anticoagulants: Secondary | ICD-10-CM | POA: Diagnosis not present

## 2019-07-14 DIAGNOSIS — R6889 Other general symptoms and signs: Secondary | ICD-10-CM | POA: Diagnosis not present

## 2019-07-14 LAB — POCT INR: INR: 2 (ref 2.0–3.0)

## 2019-07-14 NOTE — Progress Notes (Signed)
   Subjective:    Patient ID: Roberto [name], male    DOB: 01-Sep-1961, 58 y.o.   MRN: [id number]   CC: knee injections  HPI: Desire for knee injections Patient presenting today for desire bilateral knee injections.  States that he used to be seen at [facility] for this.  Last knee injection was 1 and half years ago.  Has had knee injections twice, 1 year apart.  States that steroid injections helped significantly.  States that he has started to need to use a walker due to knee pain.  States that he has had a history of knee pain for several years.  Had a history of a blood clot in his left leg and ever since has lost collagen in both his knees.  Has gone to physical therapy in the past.  Objective:  BP 112/70   Pulse 88   Wt (!) 399 lb 3.2 oz (181.1 kg)   SpO2 91%   BMI 54.14 kg/m  Vitals and nursing note reviewed  General: well nourished, in no acute distress, [facility] in place HEENT: normocephali Cardiac: Regular rate Respiratory: no increased work of breathing Extremities: no edema or cyanosis. Warm, well perfused. Knee without edema. Difficult to palpate joint lines Skin: warm and dry, no rashes noted Neuro: alert and oriented, no focal deficits   Assessment & Plan:    OA (osteoarthritis) of knee Note from 06/2018 of [facility] I[facility] going that patient has bilateral OA.  Patient was given bilateral knee injections and tolerated the procedure well. Discussed plan with Dr. Vanessa Kick, sports medicine fellow.  Patient with joint lines very difficult to palpate.  Would likely benefit from ultrasound-guided joint injections.  Unfortunately in the clinic we also did not have a 3 inch needle as patient will likely need this length given his morbid obesity.  Given that we do not have the proper equipment we have opted to send patient to sports medicine so that he may get ultrasound-guided knee injections.  Patient was initially upset by this as he was hoping to get this done today, I explained to  him that unfortunately we just do not have the equipment.  Have sent in referral and given phone number to patient so that he can call to schedule this appointment.    Return if symptoms worsen or fail to improve.   Caroline More, DO, PGY-3

## 2019-07-14 NOTE — Patient Instructions (Signed)
Acute Knee Pain, Adult Many things can cause knee pain. Sometimes, knee pain is sudden (acute) and may be caused by damage, swelling, or irritation of the muscles and tissues that support your knee. The pain often goes away on its own with time and rest. If the pain does not go away, tests may be done to find out what is causing the pain. Follow these instructions at home: Pay attention to any changes in your symptoms. Take these actions to relieve your pain. If you have a knee sleeve or brace:   Wear the sleeve or brace as told by your doctor. Remove it only as told by your doctor.  Loosen the sleeve or brace if your toes: ? Tingle. ? Become numb. ? Turn cold and blue.  Keep the sleeve or brace clean.  If the sleeve or brace is not waterproof: ? Do not let it get wet. ? Cover it with a watertight covering when you take a bath or shower. Activity  Rest your knee.  Do not do things that cause pain.  Avoid activities where both feet leave the ground at the same time (high-impact activities). Examples are running, jumping rope, and doing jumping jacks.  Work with a physical therapist to make a safe exercise program, as told by your doctor. Managing pain, stiffness, and swelling   If told, put ice on the knee: ? Put ice in a plastic bag. ? Place a towel between your skin and the bag. ? Leave the ice on for 20 minutes, 2-3 times a day.  If told, put pressure (compression) on your injured knee to control swelling, give support, and help with discomfort. Compression may be done with an elastic bandage. General instructions  Take all medicines only as told by your doctor.  Raise (elevate) your knee while you are sitting or lying down. Make sure your knee is higher than your heart.  Sleep with a pillow under your knee.  Do not use any products that contain nicotine or tobacco. These include cigarettes, e-cigarettes, and chewing tobacco. These products may slow down healing. If  you need help quitting, ask your doctor.  If you are overweight, work with your doctor and a food expert (dietitian) to set goals to lose weight. Being overweight can make your knee hurt more.  Keep all follow-up visits as told by your doctor. This is important. Contact a doctor if:  The knee pain does not stop.  The knee pain changes or gets worse.  You have a fever along with knee pain.  Your knee feels warm when you touch it.  Your knee gives out or locks up. Get help right away if:  Your knee swells, and the swelling gets worse.  You cannot move your knee.  You have very bad knee pain. Summary  Many things can cause knee pain. The pain often goes away on its own with time and rest.  Your doctor may do tests to find out the cause of the pain.  Pay attention to any changes in your symptoms. Relieve your pain with rest, medicines, light activity, and use of ice.  Get help right away if you cannot move your knee or your knee pain is very bad. This information is not intended to replace advice given to you by your health care provider. Make sure you discuss any questions you have with your health care provider. Document Released: 12/08/2008 Document Revised: 02/21/2018 Document Reviewed: 02/21/2018 Elsevier Patient Education  2020 [employer].  Please call the [facility] at [phone] to have your appointment for ultrasound guided knee injections  Dr. Tammi Klippel

## 2019-07-14 NOTE — Assessment & Plan Note (Signed)
Note from 06/2018 of [facility] Ip going that patient has bilateral OA.  Patient was given bilateral knee injections and tolerated the procedure well. Discussed plan with Dr. Vanessa Kick, sports medicine fellow.  Patient with joint lines very difficult to palpate.  Would likely benefit from ultrasound-guided joint injections.  Unfortunately in the clinic we also did not have a 3 inch needle as patient will likely need this length given his morbid obesity.  Given that we do not have the proper equipment we have opted to send patient to sports medicine so that he may get ultrasound-guided knee injections.  Patient was initially upset by this as he was hoping to get this done today, I explained to him that unfortunately we just do not have the equipment.  Have sent in referral and given phone number to patient so that he can call to schedule this appointment.

## 2019-07-16 ENCOUNTER — Other Ambulatory Visit: Payer: Self-pay | Admitting: Family Medicine

## 2019-07-16 DIAGNOSIS — I5042 Chronic combined systolic (congestive) and diastolic (congestive) heart failure: Secondary | ICD-10-CM

## 2019-07-16 DIAGNOSIS — I10 Essential (primary) hypertension: Secondary | ICD-10-CM

## 2019-07-16 DIAGNOSIS — E1142 Type 2 diabetes mellitus with diabetic polyneuropathy: Secondary | ICD-10-CM

## 2019-07-17 NOTE — Telephone Encounter (Signed)
Please call patient, "Roberto [name] wanted [place] to let you know that you are now passed overdue for getting your kidney function rechecked.  Roberto [name] is placed order for lab draw the next time you come in.  This could be done as just a lab visit, for instance when you come in to get your INR checked again.  If you wanted to get a flu shot or your pneumococcal vaccine, we could accomplish this with a nurse visit.  As always you are able to schedule an appointment with one of our doctors if you want to have a full visit, Roberto [name] is going to be out of town for most of the next month so we can schedule you with someone else if you like."  Roberto [name]

## 2019-07-18 NOTE — Telephone Encounter (Signed)
Pt informed of below and hopes to be able to stop by and get a lab appointment on Monday if his transportation works out.  Pt did not want to make an appointment for a certain time not knowing about his transportation.  Informed him if he gets here early to come and we could put him on the lab schedule and get this done.  He has appointment at sports medicine on Monday at 3:00pm. April Katharina Caper, CMA

## 2019-07-21 ENCOUNTER — Other Ambulatory Visit: Payer: Self-pay

## 2019-07-21 ENCOUNTER — Ambulatory Visit (INDEPENDENT_AMBULATORY_CARE_PROVIDER_SITE_OTHER): Payer: Medicare HMO | Admitting: Family Medicine

## 2019-07-21 VITALS — BP 136/78 | Ht 68.0 in | Wt 399.0 lb

## 2019-07-21 DIAGNOSIS — Z6841 Body Mass Index (BMI) 40.0 and over, adult: Secondary | ICD-10-CM | POA: Diagnosis not present

## 2019-07-21 DIAGNOSIS — M170 Bilateral primary osteoarthritis of knee: Secondary | ICD-10-CM | POA: Diagnosis not present

## 2019-07-21 DIAGNOSIS — R6889 Other general symptoms and signs: Secondary | ICD-10-CM | POA: Diagnosis not present

## 2019-07-21 MED ORDER — METHYLPREDNISOLONE ACETATE 40 MG/ML IJ SUSP
40.0000 mg | Freq: Once | INTRAMUSCULAR | Status: AC
  Administered 2019-07-21: 17:00:00 40 mg via INTRA_ARTICULAR

## 2019-07-21 MED ORDER — METHYLPREDNISOLONE ACETATE 40 MG/ML IJ SUSP
40.0000 mg | Freq: Once | INTRAMUSCULAR | Status: AC
  Administered 2019-07-21: 40 mg via INTRA_ARTICULAR

## 2019-07-21 NOTE — Progress Notes (Signed)
Subjective: HPI: Roberto [name] is a 58 y.o. presenting to clinic today to discuss the following:  Bilateral Knee Osteoarthritis Patient presents from [facility] for bilateral knee injections for known knee osteoarthritis. He had an appointment at [facility] for his chronic health issues and mentioned that his knees were hurting. Due to the size of his knees there was concern for needing a spinal needle to ensure the knee injections would get into the joint and it was recommended he see [place] and have it done under guidance of ultrasound. His knee pain is chronic from known OA and has been hurting for several months. It has been over a year since his last injections.  Pain better with rest and sitting, worse with ambulation.  No skin changes.  ? Swelling.  ROS noted in HPI.    Social History   Tobacco Use  Smoking Status Never Smoker  Smokeless Tobacco Never Used    Objective: BP 136/78   Ht _0  (1.727 m)   Wt (!) 399 lb (181 kg)   BMI 60.67 kg/m  Vitals and nursing notes reviewed  Physical Exam Gen: Alert, No acute distress Knee, Right: Inspection was negative for erythema, ecchymosis, and effusion. No obvious bony abnormalities or signs of osteophyte development. Palpation yielded no asymmetric warmth; No joint line tenderness; No condyle tenderness; No patellar tenderness; Positive patellar crepitus. Patellar and quadriceps tendons unremarkable, and no tenderness of the pes anserine bursa. No obvious Baker's cyst development. ROM decreased in flexion and extension Normal hamstring and quadriceps strength. Neurovascularly intact bilaterally.  Negative valgus and varus stress.  Negative ant/post drawer.  - Patella:   - Patellar grind/compression: Positive  Knee, Left: Inspection was negative for erythema, ecchymosis, and effusion. No obvious bony abnormalities or signs of osteophyte development. Palpation yielded no asymmetric warmth; No joint line tenderness; No condyle tenderness; No  patellar tenderness; Positive patellar crepitus. Patellar and quadriceps tendons unremarkable, and no tenderness of the pes anserine bursa. No obvious Baker's cyst development. ROM decreased in flexion and extension Normal hamstring and quadriceps strength. Neurovascularly intact bilaterally.  Negative valgus and varus stress.  Negative ant/post drawer.  - Patella:   - Patellar grind/compression: Positive  Assessment/Plan:  OA (osteoarthritis) of knee Bilateral knee injections performed with ultrasound assistance to identify depth to joint and landmarks. - Follow up as needed for pain. - Patient counseled on weight loss as he is not an appropriate surgical candidate due to BMI >40 - Cont conservative therapy with topical analgesics and Tylenol as needed  Knee Injection: Right Written and verbal consent was obtained after discussing the risks and benefits of the procedure with the patient. Timeout was performed.  The right knee was cleansed in a sterile fashion with alcohol swabs. 40 mg Depo-medrol and 3 cc 1% Lidocaine was injected into right knee using an anteromedial approach using a 5 cc syringe and 25 gauge 1 1/2 in needle. No complications were encountered. Minimal blood loss. A band aid was applied.   Knee Injection: Left Written and verbal consent was obtained after discussing the risks and benefits of the procedure with the patient. Timeout was performed.  The left knee was cleansed in a sterile fashion with alcohol swabs. 40 mg Depo-medrol and 3 cc 1% Lidocaine was injected into left knee using an anteromedial approach using a 5 cc syringe and 25 gauge 1 1/2 in needle. No complications were encountered. Minimal blood loss. A band aid was applied.   PATIENT EDUCATION PROVIDED: See AVS  Diagnosis and plan along with any newly prescribed medication(s) were discussed in detail with this patient today. The patient verbalized understanding and agreed with the plan. Patient advised if  symptoms worsen return to clinic or ER.   Meds ordered this encounter  Medications  . methylPREDNISolone acetate (DEPO-MEDROL) injection 40 mg  . methylPREDNISolone acetate (DEPO-MEDROL) injection 40 mg   Harolyn Rutherford, DO 07/21/2019, 4:19 PM PGY-3 [place]

## 2019-07-21 NOTE — Assessment & Plan Note (Signed)
Bilateral knee injections under U/S performed today. - Follow up as needed for pain. - Patient counseled on weight loss as he is not an appropriate surgical candidate due to BMI >40 - Cont conservative therapy with topical analgesics and Tylenol as needed

## 2019-07-21 NOTE — Patient Instructions (Addendum)
It was great to meet you today! Thank you for letting me participate in your care!  Today, we discussed bilateral knee osteoarthritis. Ultimately, weight loss will give you the most benefit for symptomatic relief and possibly allow you to one day have knee replacement surgery. We injected both knees with a corticosteroid to help with pain. This can be done as frequently as every 3 months.  Your pain is due to arthritis. These are the different medications you can take for this: Tylenol 521m 1-2 tabs three times a day for pain. Capsaicin, aspercreme, or biofreeze topically up to four times a day may also help with pain. Some supplements that may help for arthritis: Boswellia extract, curcumin, pycnogenol Cortisone injections are an option - you were given these today. It's important that you continue to stay active. Straight leg raises, knee extensions 3 sets of 10 once a day (add ankle weight if these become too easy). Consider physical therapy to strengthen muscles around the joint that hurts to take pressure off of the joint itself. Shoe inserts with good arch support may be helpful. Heat or ice 15 minutes at a time 3-4 times a day as needed to help with pain. Water aerobics and cycling with low resistance are the best two types of exercise for arthritis though any exercise is ok as long as it doesn't worsen the pain. Follow up with me as needed.  Be well, THarolyn Rutherford DO PGY-3, [facility]

## 2019-07-22 ENCOUNTER — Encounter: Payer: Self-pay | Admitting: Family Medicine

## 2019-07-25 DIAGNOSIS — G4733 Obstructive sleep apnea (adult) (pediatric): Secondary | ICD-10-CM | POA: Diagnosis not present

## 2019-07-30 ENCOUNTER — Telehealth: Payer: Self-pay

## 2019-07-30 DIAGNOSIS — E1142 Type 2 diabetes mellitus with diabetic polyneuropathy: Secondary | ICD-10-CM

## 2019-07-30 DIAGNOSIS — I10 Essential (primary) hypertension: Secondary | ICD-10-CM

## 2019-07-30 MED ORDER — GABAPENTIN 100 MG PO CAPS: ORAL_CAPSULE | ORAL | 0 refills | Status: DC

## 2019-07-30 NOTE — Telephone Encounter (Signed)
Patient leaves VM on nurse line requesting a refill on Gabapentin. Patient also stated he is now eligible for home oxygen and would like for PCP to refax over the required paperwork, (he knows what that is.) I do not see any recent notes about this. Will forward to PCP.

## 2019-07-30 NOTE — Telephone Encounter (Signed)
White team CMA pool, Please discuss with Roberto [name] what forms are needed for home o2, patient says I know what those are but that is inaccurate (patient does qualify by medical history though). Being medicare DME these might require a faculty signature.  Please also inform patient I have ordered a BMP to check kidney function (now overdue at >93yr for him to get done with his next  INR check.  I have refilled gabapentin this month but he will need this lab drawn prior to next refill.  -Dr. BCriss Rosales

## 2019-08-01 DIAGNOSIS — R0600 Dyspnea, unspecified: Secondary | ICD-10-CM | POA: Diagnosis not present

## 2019-08-01 DIAGNOSIS — I509 Heart failure, unspecified: Secondary | ICD-10-CM | POA: Diagnosis not present

## 2019-08-05 DIAGNOSIS — J9621 Acute and chronic respiratory failure with hypoxia: Secondary | ICD-10-CM | POA: Diagnosis not present

## 2019-08-05 DIAGNOSIS — I509 Heart failure, unspecified: Secondary | ICD-10-CM | POA: Diagnosis not present

## 2019-08-05 DIAGNOSIS — R0902 Hypoxemia: Secondary | ICD-10-CM | POA: Diagnosis not present

## 2019-08-05 DIAGNOSIS — G4733 Obstructive sleep apnea (adult) (pediatric): Secondary | ICD-10-CM | POA: Diagnosis not present

## 2019-08-06 NOTE — Telephone Encounter (Signed)
Community message sent to Roberto [name] with [facility] to see what steps we need to get this completed. [name], CMA

## 2019-08-07 ENCOUNTER — Other Ambulatory Visit: Payer: Self-pay | Admitting: Family Medicine

## 2019-08-07 NOTE — Telephone Encounter (Signed)
Here is what I found out about getting pt O2, this was from someone with [facility]. Roberto [name], [place], [place], Lavelle Berland D, [place]; [place], Stanford Breed, Lenna Sciara; Elon Alas        Thank you for your question. In order to qualify him for oxygen see below   NOCTURNAL O2 ONLY WITH OSA AND MEDICARE INS   (1) Order with liter flow, via nasal cannula, and nocturnal   (2) Office notes showing the chronic resp dx's, symptoms, and alternate methods tried (normally in meds list)  (3) Titrated sleep study  For patients with sleep apnea, a qualifying oxygen saturation test may only occur during a titration polysomnographic study, AKA "sleep study". The titration sleep study is one in which all of the following criteria are met:  Marland Kitchen The titration is conducted over a minimum of two hours; and . During the titration:  . The AHI/RDI is reduced to less than or equal to an average of ten events per hour; or . If the initial AHI/RDI was less than an average of ten events per hour, the titration demonstrates further reduction in the AHI/RDI; and . Nocturnal oximetry conducted for the purpose of oxygen qualification may ONLY be performed after the optimal PAP settings have been determined AND the patient is using the PAP device at those settings; and . The nocturnal oximetry conducted during the PSG (sleep study) demonstrates an oxygen saturation of &#8804;88% for 5 minutes total (does not need to be continuous)   FOR OXYGEN DURING THE DAY   We need  (1) Order with liter flow, via nasal cannula, and it needs to state either continuous or on exertion   (2) Office notes showing the chronic resp dx's, symptoms, and alternate methods tried (normally in meds list)   (3) daytime saturation levels from a 6 minute walk test- they must read as follows   (1) at rest on room air (if this one is over 88 we need #2,3  (2) room air on exertion  (3) on ___ liters oxygen on exertion   I  hope this helps and let me know if you have any other questions.

## 2019-08-08 NOTE — Telephone Encounter (Signed)
Contacted pt to inquire about his message about O2 and he confirmed that he get his O2 from [place], told pt I would contact them to see what was needed. Pt was referencing portable O2 when he called and it was not specified in the note.  When I called [facility] they stated that pt is not currently eligible for portable O2 and that pt currently has a balance owed.  Contacted pt and informed him that they said he was not eligible and he said that someone there told him he was.  I encouraged him to contact [facility] to find out what was going on and where he stood as far as getting the portable O2. Ronne Stefanski Zimmerman Rumple, CMA

## 2019-08-11 ENCOUNTER — Telehealth: Payer: Self-pay | Admitting: *Deleted

## 2019-08-11 NOTE — Telephone Encounter (Signed)
Received fax from [facility] suggesting that patient meets criteria for statin use.  If patient and provider are agreeable, then please send in a script for them to fill. Jazmin Hartsell,CMA

## 2019-08-12 DIAGNOSIS — G4733 Obstructive sleep apnea (adult) (pediatric): Secondary | ICD-10-CM | POA: Diagnosis not present

## 2019-08-12 NOTE — Telephone Encounter (Signed)
Message sent back from Angela Catron with [employer] (see below).  Appt made with pt to recert for his O2, and for portable as well. Routing to PCP as an FYI. [name], CMA    Catron, Angela J  Zimmerman Rumple, April D, CMA; Catron, Angela J; Stenson, Melissa; Ott, Jennifer L        I do not think he spoke to anyone in referral support intake. The rep he spoke to probably just gave him the general information of what was needed to get the process started, not all the clinicals because we know that can be very confusing for patients. We must have the below. He will have to come in and start all over since he is changing companies.   In order to qualify him for oxygen see below   NOCTURNAL O2 ONLY WITH OSA AND MEDICARE INS   (1) Order with liter flow, via nasal cannula, and nocturnal   (2) Office notes showing the chronic resp dx's, symptoms, and alternate methods tried (normally in meds list)  (3) Titrated sleep study  For patients with sleep apnea, a qualifying oxygen saturation test may only occur during a titration polysomnographic study, AKA "sleep study". The titration sleep study is one in which all of the following criteria are met:  . The titration is conducted over a minimum of two hours; and . During the titration:  . The AHI/RDI is reduced to less than or equal to an average of ten events per hour; or . If the initial AHI/RDI was less than an average of ten events per hour, the titration demonstrates further reduction in the AHI/RDI; and . Nocturnal oximetry conducted for the purpose of oxygen qualification may ONLY be performed after the optimal PAP settings have been determined AND the patient is using the PAP device at those settings; and . The nocturnal oximetry conducted during the PSG (sleep study) demonstrates an oxygen saturation of =88% for 5 minutes total (does not need to be continuous)   FOR OXYGEN DURING THE DAY   We need  (1) Order with liter flow, via  nasal cannula, and it needs to state either continuous or on exertion   (2) Office notes showing the chronic resp dx's, symptoms, and alternate methods tried (normally in meds list)   (3) daytime saturation levels from a 6 minute walk test- they must read as follows   (1) at rest on room air (if this one is over 88 we need #2,3  (2) room air on exertion  (3) on ___ liters oxygen on exertion   I hope this helps and let me know if you have any other questions    

## 2019-08-12 NOTE — Telephone Encounter (Signed)
Pt called and stated that he did call [facility] and that he is getting the run around from them.  He said they say one thing and then when he calls again they say something else.  He said that he called [facility] [place] and they said we just needed to fax over an order for this.  Sent community message to [employer] with [facility] to see what we need to do. Cheryle Dark Zimmerman Rumple, CMA

## 2019-08-13 ENCOUNTER — Other Ambulatory Visit: Payer: Self-pay | Admitting: Family Medicine

## 2019-08-13 DIAGNOSIS — Z5181 Encounter for therapeutic drug level monitoring: Secondary | ICD-10-CM

## 2019-08-13 DIAGNOSIS — Z7901 Long term (current) use of anticoagulants: Secondary | ICD-10-CM

## 2019-08-14 NOTE — Telephone Encounter (Signed)
Patient has an appt on 08-27-2019 and can discuss at that time.  Avery Klingbeil,CMA

## 2019-08-25 DIAGNOSIS — G4733 Obstructive sleep apnea (adult) (pediatric): Secondary | ICD-10-CM | POA: Diagnosis not present

## 2019-08-27 ENCOUNTER — Ambulatory Visit: Payer: Medicare HMO | Admitting: Family Medicine

## 2019-08-31 DIAGNOSIS — I509 Heart failure, unspecified: Secondary | ICD-10-CM | POA: Diagnosis not present

## 2019-08-31 DIAGNOSIS — R0600 Dyspnea, unspecified: Secondary | ICD-10-CM | POA: Diagnosis not present

## 2019-09-01 ENCOUNTER — Telehealth: Payer: Self-pay | Admitting: *Deleted

## 2019-09-01 DIAGNOSIS — Z7901 Long term (current) use of anticoagulants: Secondary | ICD-10-CM

## 2019-09-01 NOTE — Telephone Encounter (Signed)
Pt calls to requests something stronger than gabapentin for his pain ( he has used 400 mg on occasion).  States that he may not be able to make appt on Wednesday due to his pain and wonders if we can do the change over the phone.   Advised that appt on Wednesday was to recertify him for O2 and lab work so it was likely that Dr. Criss Rosales would want to see him before changing meds.  He would like for [place] to check with Dr. Criss Rosales and them let him know the answer. Christen Bame, CMA

## 2019-09-02 ENCOUNTER — Telehealth: Payer: Self-pay | Admitting: Family Medicine

## 2019-09-02 NOTE — Telephone Encounter (Signed)
Patient needs to know if anything can be prescribed for pain without him coming in to office because it is hard for him to walk.  He also needs to know if we can do a virtual appt for this instead.

## 2019-09-03 ENCOUNTER — Other Ambulatory Visit: Payer: Self-pay | Admitting: Family Medicine

## 2019-09-03 ENCOUNTER — Ambulatory Visit: Payer: Medicare HMO | Admitting: Family Medicine

## 2019-09-03 DIAGNOSIS — Z5181 Encounter for therapeutic drug level monitoring: Secondary | ICD-10-CM

## 2019-09-03 DIAGNOSIS — Z7901 Long term (current) use of anticoagulants: Secondary | ICD-10-CM

## 2019-09-04 DIAGNOSIS — I509 Heart failure, unspecified: Secondary | ICD-10-CM | POA: Diagnosis not present

## 2019-09-04 DIAGNOSIS — G4733 Obstructive sleep apnea (adult) (pediatric): Secondary | ICD-10-CM | POA: Diagnosis not present

## 2019-09-04 DIAGNOSIS — J9621 Acute and chronic respiratory failure with hypoxia: Secondary | ICD-10-CM | POA: Diagnosis not present

## 2019-09-04 DIAGNOSIS — R0902 Hypoxemia: Secondary | ICD-10-CM | POA: Diagnosis not present

## 2019-09-08 ENCOUNTER — Telehealth: Payer: Self-pay | Admitting: *Deleted

## 2019-09-08 NOTE — Telephone Encounter (Signed)
Received fax stating that pt meets criteria for the Centers for Medicare & Medicaid Star ratings measure Statin use in person with diabetes. Recommended medication -atorvastin, rosuvastatin, pravastatin, or lovastatin. (any strength is acceptable). Michial Disney Zimmerman Rumple, CMA

## 2019-09-09 ENCOUNTER — Other Ambulatory Visit: Payer: Self-pay

## 2019-09-09 DIAGNOSIS — E1142 Type 2 diabetes mellitus with diabetic polyneuropathy: Secondary | ICD-10-CM

## 2019-09-09 NOTE — Telephone Encounter (Signed)
Pt called nurse line requesting medication for pain. Per Dr. Criss Rosales, patient can not get pain medication without being seen in office. Discussed with patient options for being seen in office. Offered patient an appointment tomorrow afternoon, but patient declined and scheduled for January 6th. Talbot Grumbling, RN

## 2019-09-11 DIAGNOSIS — G4733 Obstructive sleep apnea (adult) (pediatric): Secondary | ICD-10-CM | POA: Diagnosis not present

## 2019-09-15 MED ORDER — GABAPENTIN 100 MG PO CAPS: ORAL_CAPSULE | ORAL | 0 refills | Status: DC

## 2019-09-15 NOTE — Addendum Note (Signed)
Addended by: Sherene Sires R on: 09/15/2019 02:06 PM   Modules accepted: Orders

## 2019-09-15 NOTE — Telephone Encounter (Signed)
Patient calls nurse line asking for Gabapentin for pain. Patient has an apt for January 6th. I informed him he no showed his last apt, therefore I am not sure PCP will be willing to give him a refill. Please advise.

## 2019-09-15 NOTE — Telephone Encounter (Signed)
I think it is appropriate to refill this chronic medication, what I cannot do is honor the prior request to increase pain medication in order to avoid coming to clinic because increased leg pain could be due to a serious condition and patient should be examined.  Medication is refilled, will reroute to CMA pool for notifying patient  Dr. Criss Rosales

## 2019-09-15 NOTE — Telephone Encounter (Signed)
Pt informed. Pt appreciates the refill and will be here on Jan 6th. Ottis Stain, CMA

## 2019-09-15 NOTE — Addendum Note (Signed)
Addended by: Dorna Bloom on: 09/15/2019 01:55 PM   Modules accepted: Orders

## 2019-09-24 DIAGNOSIS — G4733 Obstructive sleep apnea (adult) (pediatric): Secondary | ICD-10-CM | POA: Diagnosis not present

## 2019-10-01 ENCOUNTER — Ambulatory Visit: Payer: Medicare HMO | Admitting: Family Medicine

## 2019-10-01 DIAGNOSIS — R0600 Dyspnea, unspecified: Secondary | ICD-10-CM | POA: Diagnosis not present

## 2019-10-01 DIAGNOSIS — I509 Heart failure, unspecified: Secondary | ICD-10-CM | POA: Diagnosis not present

## 2019-10-05 DIAGNOSIS — J9621 Acute and chronic respiratory failure with hypoxia: Secondary | ICD-10-CM | POA: Diagnosis not present

## 2019-10-05 DIAGNOSIS — I509 Heart failure, unspecified: Secondary | ICD-10-CM | POA: Diagnosis not present

## 2019-10-05 DIAGNOSIS — G4733 Obstructive sleep apnea (adult) (pediatric): Secondary | ICD-10-CM | POA: Diagnosis not present

## 2019-10-05 DIAGNOSIS — R0902 Hypoxemia: Secondary | ICD-10-CM | POA: Diagnosis not present

## 2019-10-08 ENCOUNTER — Ambulatory Visit (INDEPENDENT_AMBULATORY_CARE_PROVIDER_SITE_OTHER): Payer: Medicare HMO | Admitting: Family Medicine

## 2019-10-08 ENCOUNTER — Other Ambulatory Visit: Payer: Self-pay

## 2019-10-08 ENCOUNTER — Encounter: Payer: Self-pay | Admitting: Family Medicine

## 2019-10-08 VITALS — BP 140/72 | HR 70 | Wt 393.0 lb

## 2019-10-08 DIAGNOSIS — Z7901 Long term (current) use of anticoagulants: Secondary | ICD-10-CM

## 2019-10-08 DIAGNOSIS — E1142 Type 2 diabetes mellitus with diabetic polyneuropathy: Secondary | ICD-10-CM

## 2019-10-08 DIAGNOSIS — Z5181 Encounter for therapeutic drug level monitoring: Secondary | ICD-10-CM

## 2019-10-08 DIAGNOSIS — I10 Essential (primary) hypertension: Secondary | ICD-10-CM | POA: Diagnosis not present

## 2019-10-08 DIAGNOSIS — J9611 Chronic respiratory failure with hypoxia: Secondary | ICD-10-CM

## 2019-10-08 LAB — POCT GLYCOSYLATED HEMOGLOBIN (HGB A1C): HbA1c, POC (controlled diabetic range): 7.8 % — AB (ref 0.0–7.0)

## 2019-10-08 LAB — POCT INR: INR: 2 (ref 2.0–3.0)

## 2019-10-08 NOTE — Progress Notes (Signed)
Subjective:  Roberto [name] is a 59 y.o. male who presents to the [facility] today with a chief complaint of documentation for insurance approval of home oxygen.   HPI: Warfarin anticoagulation Patient has been inconsistent on follow-up due to concerns of Covid pandemic, has been consistently taking 10 mg daily for the last few weeks, no reports of bleed or clot symptoms  Type 2 diabetes mellitus with diabetic polyneuropathy, without long-term current use of insulin (HCC) A1c now 7.8, this is been slowly increasing since his last 3 visits.  Currently only on Metformin and has been wanting to avoid injectables, consents to A1c check.  Knows he has been gaining weight, has reduced his exercise for fear of being around people during Covid pandemic  Chronic respiratory failure with hypoxia ([place]) Long-term known issue, no change in status.  Patient is still functioning on 5 L daily at home.  He would like to get a battery-operated supply because the small tanks often limit his activities because he is concerned of running out of oxygen  Objective:  Physical Exam: BP 140/72   Pulse 70   Wt (!) 393 lb (178.3 kg)   SpO2 91% Comment: 5L oxygen  BMI 59.76 kg/m   Gen: NAD, resting comfortably on 5 L, uses walker for ambulation CV: RRR with 2/6 murmur Pulm: On 5 L nasal cannula, moderate increased work of breathing which is baseline.  Lungs with chronic mild diffuse wheeze, no cough during my exam MSK: Baseline edema Skin: warm, dry Neuro: grossly normal, moves all extremities Psych: Normal affect and thought content  Results for orders placed or performed in visit on 10/08/19 (from the past 72 hour(s))  HgB A1c     Status: Abnormal   Collection Time: 10/08/19  4:10 PM  Result Value Ref Range   Hemoglobin A1C     HbA1c POC (<> result, manual entry)     HbA1c, POC (prediabetic range)     HbA1c, POC (controlled diabetic range) 7.8 (A) 0.0 - 7.0 %  POCT INR     Status: None   Collection Time:  10/08/19  4:10 PM  Result Value Ref Range   INR 2.0 2.0 - 3.0  CBC     Status: None   Collection Time: 10/08/19  4:26 PM  Result Value Ref Range   WBC 6.6 3.4 - 10.8 x10E3/uL   RBC 4.64 4.14 - 5.80 x10E6/uL   Hemoglobin 13.3 13.0 - 17.7 g/dL   Hematocrit 41.5 37.5 - 51.0 %   MCV 89 79 - 97 fL   MCH 28.7 26.6 - 33.0 pg   MCHC 32.0 31.5 - 35.7 g/dL   RDW 13.7 11.6 - 15.4 %   Platelets 208 150 - 450 [id number]  Basic Metabolic Panel     Status: Abnormal   Collection Time: 10/08/19  4:26 PM  Result Value Ref Range   Glucose 206 (H) 65 - 99 mg/dL   BUN 21 6 - 24 mg/dL   Creatinine, Ser 1.39 (H) 0.76 - 1.27 mg/dL   GFR calc non Af Amer 55 (L) >59 mL/min/1.73   GFR calc Af Amer 64 >59 mL/min/1.73   BUN/Creatinine Ratio 15 9 - 20   Sodium 140 134 - 144 mmol/L   Potassium 4.1 3.5 - 5.2 mmol/L   Chloride 97 96 - 106 mmol/L   CO2 26 20 - 29 mmol/L   Calcium 9.9 8.7 - 10.2 mg/dL     Assessment/Plan:  Warfarin anticoagulation Patient has been inconsistent  on follow-up due to concerns of Covid pandemic, has been consistently taking 10 mg daily for the last few weeks, INR is 2.0 which is low end of therapeutic range of 2-3.  Continue warfarin 10 mg, advised to come in weekly until we are consistently in therapeutic range  Type 2 diabetes mellitus with diabetic polyneuropathy, without long-term current use of insulin (HCC) A1c now 7.8, this is been slowly increasing since his last 3 visits.  Currently only on Metformin and has been wanting to avoid injectables, will reschedule for phone visit to discuss add on of Jardiance.  I will discuss potential of GLP-1 with pharmacy given this patient is on warfarin.  We did have a talk about restarting his exercise regimen and try to get back on plan with his diet.  Essential hypertension Weight gain of 20 pounds over the last year, we discussed that this is impacting his overall health.  Now that we have recent creatinine we can discuss further  adjustments to blood pressure medication on his follow-up telemedicine visit.  Chronic respiratory failure with hypoxia (HCC) Long-term known issue, no change in status.  Patient is still functioning on 5 L daily at home.  He would like to get a battery-operated supply because the small tanks often limit his activities.  DME home oxygen certification: O2 sats at rest with 5 L oxygen: 93% O2 sats at rest 2 minutes after starting room air: 86% This test was performed personally by Dr. Criss Rosales on October 08, 2019   Sherene Sires, [place] - PGY3 10/10/2019 8:41 AM

## 2019-10-09 LAB — BASIC METABOLIC PANEL
BUN/Creatinine Ratio: 15 (ref 9–20)
BUN: 21 mg/dL (ref 6–24)
CO2: 26 mmol/L (ref 20–29)
Calcium: 9.9 mg/dL (ref 8.7–10.2)
Chloride: 97 mmol/L (ref 96–106)
Creatinine, Ser: 1.39 mg/dL — ABNORMAL HIGH (ref 0.76–1.27)
GFR calc Af Amer: 64 mL/min/{1.73_m2} (ref >59)
GFR calc non Af Amer: 55 mL/min/{1.73_m2} — ABNORMAL LOW (ref >59)
Glucose: 206 mg/dL — ABNORMAL HIGH (ref 65–99)
Potassium: 4.1 mmol/L (ref 3.5–5.2)
Sodium: 140 mmol/L (ref 134–144)

## 2019-10-09 LAB — CBC
Hematocrit: 41.5 % (ref 37.5–51.0)
Hemoglobin: 13.3 g/dL (ref 13.0–17.7)
MCH: 28.7 pg (ref 26.6–33.0)
MCHC: 32 g/dL (ref 31.5–35.7)
MCV: 89 fL (ref 79–97)
Platelets: 208 10*3/uL (ref 150–450)
RBC: 4.64 x10E6/uL (ref 4.14–5.80)
RDW: 13.7 % (ref 11.6–15.4)
WBC: 6.6 10*3/uL (ref 3.4–10.8)

## 2019-10-10 DIAGNOSIS — Z7901 Long term (current) use of anticoagulants: Secondary | ICD-10-CM | POA: Insufficient documentation

## 2019-10-10 MED ORDER — WARFARIN SODIUM 5 MG PO TABS: ORAL_TABLET | ORAL | 0 refills | Status: DC

## 2019-10-10 NOTE — Assessment & Plan Note (Signed)
Weight gain of 20 pounds over the last year, we discussed that this is impacting his overall health.  Now that we have recent creatinine we can discuss further adjustments to blood pressure medication on his follow-up telemedicine visit.

## 2019-10-10 NOTE — Assessment & Plan Note (Addendum)
Patient has been inconsistent on follow-up due to concerns of Covid pandemic, has been consistently taking 10 mg daily for the last few weeks, INR is 2.0 which is low end of therapeutic range of 2-3.  Continue warfarin 10 mg, advised to come in weekly until we are consistently in therapeutic range

## 2019-10-10 NOTE — Assessment & Plan Note (Addendum)
A1c now 7.8, this is been slowly increasing since his last 3 visits.  Currently only on Metformin and has been wanting to avoid injectables, will reschedule for phone visit to discuss add on of [place].  I will discuss potential of GLP-1 with pharmacy given this patient is on warfarin.  We did have a talk about restarting his exercise regimen and try to get back on plan with his diet.

## 2019-10-10 NOTE — Assessment & Plan Note (Signed)
Long-term known issue, no change in status.  Patient is still functioning on 5 L daily at home.  He would like to get a battery-operated supply because the small tanks often limit his activities.  DME home oxygen certification: O2 sats at rest with 5 L oxygen: 93% O2 sats at rest 2 minutes after starting room air: 86% This test was performed personally by Dr. Criss Rosales on October 08, 2019

## 2019-10-13 ENCOUNTER — Telehealth: Payer: Self-pay | Admitting: *Deleted

## 2019-10-13 DIAGNOSIS — J9611 Chronic respiratory failure with hypoxia: Secondary | ICD-10-CM

## 2019-10-13 NOTE — Telephone Encounter (Signed)
Pt is trying to get a portable concentrator so that he does not have to take the heavy tanks with him, when he leaves the house.  He felt as if he was getting the "run around" with [facility] so wanted to try [facility].  Unfortunately, [facility] is out of portable concentrators at this time and is unsure of when they will get more in.  I spoke with [facility] and they will need a Script, referral form and chart notes sent over in order to qualify the patient.  Spoke with pt and he is agreeable to trying [employer] again.   Dr. Criss Rosales, I need 2 things from you....  1. Please place an order for a portable concentrator, so that I can print and fax  2. Please review and have a FACULTY provider sign the form in your box.  Christen Bame, CMA

## 2019-10-13 NOTE — Telephone Encounter (Signed)
All info faxed and Christal with [employer] informed that it was in the way. Christen Bame, CMA

## 2019-10-13 NOTE — Telephone Encounter (Signed)
Signed DME order for portable oxygen concentrator  -Dr. Criss Rosales

## 2019-10-13 NOTE — Telephone Encounter (Signed)
Community message sent to [employer] and Darlina Guys @ [facility] to process DME order for Battery operated O2.   Christen Bame, CMA

## 2019-10-16 NOTE — Telephone Encounter (Signed)
Pt calls back, he request that we send a new order to [facility] to get him a portable oxygen.  Community Message sent to [employer] and [employer] @ [employer].  Christen Bame, CMA

## 2019-10-17 ENCOUNTER — Telehealth: Payer: Self-pay | Admitting: *Deleted

## 2019-10-17 NOTE — Telephone Encounter (Signed)
Contacted pt and televisit scheduled on 11/12/2019. Roberto [name], CMA

## 2019-10-17 NOTE — Telephone Encounter (Signed)
-----  Message from Sherene Sires, DO sent at 10/10/2019  7:49 AM EST ----- Please schedule for a telemedicine visit to discuss DM meds

## 2019-10-20 NOTE — Telephone Encounter (Signed)
[facility] received request.  They need the order to show how many liters of oxygen the patient uses.  To PCP.  Christen Bame, CMA

## 2019-10-21 NOTE — Telephone Encounter (Signed)
[facility] has received and will process now.  Christen Bame, CMA

## 2019-10-21 NOTE — Addendum Note (Signed)
Addended by: Christen Bame D on: 10/21/2019 02:26 PM   Modules accepted: Orders

## 2019-10-21 NOTE — Telephone Encounter (Signed)
Order has been changed and [employer] has been notified via [employer].  Christen Bame, CMA

## 2019-10-25 DIAGNOSIS — G4733 Obstructive sleep apnea (adult) (pediatric): Secondary | ICD-10-CM | POA: Diagnosis not present

## 2019-10-29 ENCOUNTER — Other Ambulatory Visit: Payer: Self-pay

## 2019-10-29 ENCOUNTER — Encounter: Payer: Self-pay | Admitting: Family Medicine

## 2019-10-29 ENCOUNTER — Ambulatory Visit (INDEPENDENT_AMBULATORY_CARE_PROVIDER_SITE_OTHER): Payer: Medicare HMO | Admitting: Family Medicine

## 2019-10-29 VITALS — BP 129/67 | Ht 69.0 in | Wt 393.0 lb

## 2019-10-29 DIAGNOSIS — M170 Bilateral primary osteoarthritis of knee: Secondary | ICD-10-CM | POA: Diagnosis not present

## 2019-10-29 MED ORDER — METHYLPREDNISOLONE ACETATE 40 MG/ML IJ SUSP
40.0000 mg | Freq: Once | INTRAMUSCULAR | Status: AC
  Administered 2019-10-29: 17:00:00 40 mg via INTRA_ARTICULAR

## 2019-10-29 MED ORDER — METHYLPREDNISOLONE ACETATE 40 MG/ML IJ SUSP
40.0000 mg | Freq: Once | INTRAMUSCULAR | Status: AC
  Administered 2019-10-29: 40 mg via INTRA_ARTICULAR

## 2019-10-29 NOTE — Patient Instructions (Addendum)
Your pain is due to arthritis. These are the different medications you can take for this: Tylenol 576m 1-2 tabs three times a day for pain. Capsaicin, aspercreme, or biofreeze topically up to four times a day may also help with pain. Some supplements that may help for arthritis: Boswellia extract, curcumin, pycnogenol We talked about meloxicam - a once a day anti-inflammatory instead of ibuprofen - discuss this with Dr. BCriss Rosaleswhen you discuss lab results.  Continue the ibuprofen as needed only in the meantime. Cortisone injections are an option - you were given these today. It's important that you continue to stay active. Straight leg raises, knee extensions 3 sets of 10 once a day (add ankle weight if these become too easy). Consider physical therapy to strengthen muscles around the joint that hurts to take pressure off of the joint itself. Shoe inserts with good arch support may be helpful. Heat or ice 15 minutes at a time 3-4 times a day as needed to help with pain. Water aerobics and cycling with low resistance are the best two types of exercise for arthritis though any exercise is ok as long as it doesn't worsen the pain. Follow up with me as needed.

## 2019-10-29 NOTE — Progress Notes (Signed)
PCP: Sherene Sires, DO  Subjective:   HPI: Patient is a 59 y.o. male here for bilateral knee pain.  07/21/19: Bilateral Knee Osteoarthritis Patient presents from [facility] for bilateral knee injections for known knee osteoarthritis. He had an appointment at [facility] for his chronic health issues and mentioned that his knees were hurting. Due to the size of his knees there was concern for needing a spinal needle to ensure the knee injections would get into the joint and it was recommended he see [place] and have it done under guidance of ultrasound. His knee pain is chronic from known OA and has been hurting for several months. It has been over a year since his last injections.  Pain better with rest and sitting, worse with ambulation.  No skin changes.  ? Swelling.  10/29/19: Patient reports injections last visit helped him quite a bit. Having some pain if he walks more than normal. Takes ibuprofen twice a day regularly. Right knee bothers him more than left currently. No other complaints.  Past Medical History:  Diagnosis Date  . Asthma   . CHF (congestive heart failure) ([place])   . COPD (chronic obstructive pulmonary disease) ([place])   . Diabetes mellitus without complication ([place])   . Hypertension     Current Outpatient Medications on File Prior to Visit  Medication Sig Dispense Refill  . ACCU-CHEK AVIVA PLUS test strip TEST TWO TIMES DAILY 200 strip 1  . Accu-Chek Softclix Lancets lancets USE TO TEST TWO TIMES DAILY 200 each 3  . acetaminophen (TYLENOL) 325 MG tablet Take 2 tablets (650 mg total) by mouth every 6 (six) hours as needed. 60 tablet 0  . Alcohol Swabs (B-D SINGLE USE SWABS REGULAR) PADS USE TWO TIMES DAILY 200 each 3  . amLODipine (NORVASC) 5 MG tablet TAKE 1 TABLET EVERY DAY 90 tablet 1  . Blood Glucose Monitoring Suppl (ACCU-CHEK AVIVA PLUS) w/Device KIT TEST TWO TIMES DAILY 1 kit 0  . cholecalciferol (VITAMIN D) 1000 units tablet Take 1 tablet (1,000 Units total) by mouth daily. 90  tablet 2  . Elastic Bandages & Supports (WRIST SUPPORT/ELASTIC/FIRM LG) MISC     . ferrous sulfate 325 (65 FE) MG tablet Take 325 mg by mouth daily.    Marland Kitchen gabapentin (NEURONTIN) 100 MG capsule TAKE 1 CAPSULE THREE TIMES DAILY 90 capsule 0  . ipratropium-albuterol (DUONEB) 0.5-2.5 (3) MG/3ML SOLN Take 3 mLs by nebulization every 4 (four) hours as needed. 360 mL 3  . lisinopril (ZESTRIL) 10 MG tablet TAKE 1 TABLET EVERY DAY 90 tablet 1  . metFORMIN (GLUCOPHAGE) 1000 MG tablet Take 1 tablet (1,000 mg total) by mouth daily. 90 tablet 3  . Multiple Vitamin (MULTIVITAMIN WITH MINERALS) TABS tablet Take 1 tablet by mouth daily.    . mupirocin ointment (BACTROBAN) 2 % Place 1 application into the nose 2 (two) times daily. 30 g 2  . naproxen sodium (ALEVE) 220 MG tablet Take 220 mg by mouth every 12 (twelve) hours as needed (pain).    . OXYGEN Inhale 5 L into the lungs continuous.    . pantoprazole (PROTONIX) 40 MG tablet Take 1 tablet (40 mg total) by mouth daily as needed (acid reflux). (Patient not taking: Reported on 06/18/2018) 90 tablet 1  . PRESCRIPTION MEDICATION Inhale into the lungs as needed (whenever sleeping - naps or at night). CPAP    . torsemide (DEMADEX) 20 MG tablet Take 3 tablets (60 mg total) by mouth at bedtime. 270 tablet 1  . warfarin (  COUMADIN) 5 MG tablet TAKE 2 TABLETS EVERY DAY.  PLEASE PRESENT TO THE CLINIC FOR AN INR CHECK EVERY WEEK UNTIL THERAPEUTIC CONSISTENTLY 60 tablet 0   No current facility-administered medications on file prior to visit.    Past Surgical History:  Procedure Laterality Date  . SHOULDER SURGERY      No Known Allergies  Social History   Socioeconomic History  . Marital status: Divorced    Spouse name: Not on file  . Number of children: 2  . Years of education: 12th grade  . Highest education level: High school graduate  Occupational History  . 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 Smoking status:  Never Smoker  . Smokeless tobacco: Never Used  Substance and Sexual Activity  . Alcohol use: No  . Drug use: No  . Sexual activity: Not Currently    Birth control/protection: Condom  Other Topics Concern  . Not on file  Social History Narrative   Lives in senior apartments, lives alone. Ground floor. Has social activities daily. Uses treadmill. No steps, grab rails in bathroom, smoke alarm, wears seat belt. No firearms.    Social Determinants of Health   Financial Resource Strain:   . Difficulty of Paying Living Expenses: Not on file  Food Insecurity:   . Worried About Charity fundraiser in the Last Year: Not on file  . Ran Out of Food in the Last Year: Not on file  Transportation Needs:   . Lack of Transportation (Medical): Not on file  . Lack of Transportation (Non-Medical): Not on file  Physical Activity:   . Days of Exercise per Week: Not on file  . Minutes of Exercise per Session: Not on file  Stress:   . Feeling of Stress : Not on file  Social Connections:   . Frequency of Communication with Friends and Family: Not on file  . Frequency of Social Gatherings with Friends and Family: Not on file  . Attends Religious Services: Not on file  . Active Member of Clubs or Organizations: Not on file  . Attends Archivist Meetings: Not on file  . Marital Status: Not on file  Intimate Partner Violence:   . Fear of Current or Ex-Partner: Not on file  . Emotionally Abused: Not on file  . Physically Abused: Not on file  . Sexually Abused: Not on file    Family History  Problem Relation Age of Onset  . Diabetes Mother   . Cancer Mother   . Diabetes Father   . High blood pressure Father   . Diabetes Brother   . Diabetes Brother     BP 129/67   Ht _0  (1.753 m)   Wt (!) 393 lb (178.3 kg)   BMI 58.04 kg/m   Review of Systems: See HPI above.     Objective:  Physical Exam:  Gen: NAD, comfortable in exam room  Bilateral knee exam not repeated today.    Assessment & Plan:  1. Bilateral knee pain - 2/2 osteoarthritis.  We had a long discussion today regarding treatment options especially visco vs steroid injections - he wanted to proceed with steroid injections so we did these today.  Also discussed NSAIDs and risks given recent labwork with GFR < 60 and being on coumadin - advised to discuss with PCP regarding this and consideration of meloxicam.  F/u with [place] prn - would need knee radiographs prior to approval for visco.  After  informed written consent timeout was performed, patient was seated in chair in exam room. Right knee was prepped with alcohol swab and utilizing anteromedial approach, patient's right knee was injected intraarticularly with 3:1 bupivicaine: depomedrol. Patient tolerated the procedure well without immediate complications.  After informed written consent timeout was performed, patient was seated in chair in exam room. Left knee was prepped with alcohol swab and utilizing anteromedial approach, patient's left knee was injected intraarticularly with 3:1 bupivicaine: depomedrol. Patient tolerated the procedure well without immediate complications.

## 2019-10-31 ENCOUNTER — Other Ambulatory Visit: Payer: Self-pay | Admitting: Family Medicine

## 2019-10-31 DIAGNOSIS — Z5181 Encounter for therapeutic drug level monitoring: Secondary | ICD-10-CM

## 2019-10-31 DIAGNOSIS — Z7901 Long term (current) use of anticoagulants: Secondary | ICD-10-CM

## 2019-10-31 DIAGNOSIS — I509 Heart failure, unspecified: Secondary | ICD-10-CM | POA: Diagnosis not present

## 2019-10-31 DIAGNOSIS — J9611 Chronic respiratory failure with hypoxia: Secondary | ICD-10-CM | POA: Diagnosis not present

## 2019-11-01 DIAGNOSIS — R0600 Dyspnea, unspecified: Secondary | ICD-10-CM | POA: Diagnosis not present

## 2019-11-01 DIAGNOSIS — I509 Heart failure, unspecified: Secondary | ICD-10-CM | POA: Diagnosis not present

## 2019-11-05 DIAGNOSIS — G4733 Obstructive sleep apnea (adult) (pediatric): Secondary | ICD-10-CM | POA: Diagnosis not present

## 2019-11-05 DIAGNOSIS — J9621 Acute and chronic respiratory failure with hypoxia: Secondary | ICD-10-CM | POA: Diagnosis not present

## 2019-11-05 DIAGNOSIS — R0902 Hypoxemia: Secondary | ICD-10-CM | POA: Diagnosis not present

## 2019-11-05 DIAGNOSIS — I509 Heart failure, unspecified: Secondary | ICD-10-CM | POA: Diagnosis not present

## 2019-11-12 ENCOUNTER — Telehealth (INDEPENDENT_AMBULATORY_CARE_PROVIDER_SITE_OTHER): Payer: Medicare HMO | Admitting: Family Medicine

## 2019-11-12 ENCOUNTER — Other Ambulatory Visit: Payer: Self-pay

## 2019-11-12 DIAGNOSIS — E1142 Type 2 diabetes mellitus with diabetic polyneuropathy: Secondary | ICD-10-CM

## 2019-11-12 DIAGNOSIS — Z7901 Long term (current) use of anticoagulants: Secondary | ICD-10-CM

## 2019-11-14 NOTE — Assessment & Plan Note (Signed)
Uncontrolled with A1c of 7.8.  Medication plan complicated by financial stress and warfarin anticoagulation  Patient accepts and is optimistic about scheduling with pharmacy to discuss financially assistance for changing to doac and adding glp1 to DM regimen

## 2019-11-14 NOTE — Assessment & Plan Note (Signed)
Pharmacy will schedule for discussion to potentially transition to [place] (failed xarelto) from warfarin which should provide safety (hard to get to INR checks) and more options for DM

## 2019-11-14 NOTE — Progress Notes (Signed)
[place] Telemedicine Visit  Patient consented to have virtual visit. Method of visit: Video  Encounter participants: Patient: Roberto [name] - located at home Provider: Sherene Sires - located at [facility] clinic  Chief Complaint: Diabetes follow-up  HPI: Main goal of visit was to discuss potential of adding a GLP-1.  Warfarin does interfere with some medication options and patient has issue with affording meds.  He really prefers to stay away from insulin if at all possible.  There has been prior staff discussion about trying an alternative DOAC to Xarelto as it has been difficult for patient to come regularly for INR checks and he has been difficult to keep within therapeutic range.  After discussing GLP-1, patient is very interested in doing this but is nervous that he will not be able to afford the medication.  He is also open to changing to a DOAC.  He has been attempting to start walking again which she had stopped because he is scared of of catching the coronavirus from his neighbors.  He feels he is doing moderately well with his diet.  ROS: per HPI  Pertinent PMHx: Patient with significant comorbidities including obesity, chronic oxygen need, on warfarin for DVT while on Xarelto.  Last A1c 7.8.  Exam:  Respiratory: Speaking clearly in full sentences, still using nasal cannula as he has a chronic 5 L oxygen need. Alert and showing good insight into his conditions with strong motivation to try and control his disease within his financial means.  Assessment/Plan:  Type 2 diabetes mellitus with diabetic polyneuropathy, without long-term current use of insulin (HCC) Uncontrolled with A1c of 7.8.  Medication plan complicated by financial stress and warfarin anticoagulation  Patient accepts and is optimistic about scheduling with pharmacy to discuss financially assistance for changing to doac and adding glp1 to DM regimen  Warfarin anticoagulation Pharmacy will  schedule for discussion to potentially transition to [place] (failed xarelto) from warfarin which should provide safety (hard to get to INR checks) and more options for DM    Time spent during visit with patient: 15 minutes

## 2019-11-20 ENCOUNTER — Telehealth: Payer: Self-pay

## 2019-11-20 ENCOUNTER — Ambulatory Visit: Payer: Medicare HMO | Admitting: Pharmacist

## 2019-11-20 NOTE — Telephone Encounter (Signed)
Patient had an in clinic visit to discuss financial assistance for potentially changing from warfarin to a DOAC for Afib and potentially adding a GLP-1 for diabetes management. Due to unforseen barriers, patient was unable to make it to clinic in person.   Follow-up with a phone call to establish next steps for assistance from the pharmacy team.After speaking with the patient, he is is in agreement that he will schedule a visit with Dr. Valentina Lucks in the upcoming weeks in clinic to further discuss in financial assistance for his medications.   Acey Lav, PharmD  PGY1 Acute Care Pharmacy Resident

## 2019-11-23 DIAGNOSIS — G4733 Obstructive sleep apnea (adult) (pediatric): Secondary | ICD-10-CM | POA: Diagnosis not present

## 2019-11-24 ENCOUNTER — Other Ambulatory Visit: Payer: Self-pay | Admitting: Family Medicine

## 2019-11-24 DIAGNOSIS — Z5181 Encounter for therapeutic drug level monitoring: Secondary | ICD-10-CM

## 2019-11-24 DIAGNOSIS — Z7901 Long term (current) use of anticoagulants: Secondary | ICD-10-CM

## 2019-11-26 ENCOUNTER — Other Ambulatory Visit: Payer: Self-pay | Admitting: Family Medicine

## 2019-11-26 DIAGNOSIS — E1142 Type 2 diabetes mellitus with diabetic polyneuropathy: Secondary | ICD-10-CM

## 2019-11-28 DIAGNOSIS — I509 Heart failure, unspecified: Secondary | ICD-10-CM | POA: Diagnosis not present

## 2019-11-28 DIAGNOSIS — J9611 Chronic respiratory failure with hypoxia: Secondary | ICD-10-CM | POA: Diagnosis not present

## 2019-12-03 DIAGNOSIS — J9621 Acute and chronic respiratory failure with hypoxia: Secondary | ICD-10-CM | POA: Diagnosis not present

## 2019-12-03 DIAGNOSIS — R0902 Hypoxemia: Secondary | ICD-10-CM | POA: Diagnosis not present

## 2019-12-03 DIAGNOSIS — I509 Heart failure, unspecified: Secondary | ICD-10-CM | POA: Diagnosis not present

## 2019-12-03 DIAGNOSIS — G4733 Obstructive sleep apnea (adult) (pediatric): Secondary | ICD-10-CM | POA: Diagnosis not present

## 2019-12-08 ENCOUNTER — Other Ambulatory Visit: Payer: Self-pay | Admitting: Family Medicine

## 2019-12-08 DIAGNOSIS — E1142 Type 2 diabetes mellitus with diabetic polyneuropathy: Secondary | ICD-10-CM

## 2019-12-15 ENCOUNTER — Other Ambulatory Visit: Payer: Self-pay | Admitting: Family Medicine

## 2019-12-15 DIAGNOSIS — Z5181 Encounter for therapeutic drug level monitoring: Secondary | ICD-10-CM

## 2019-12-15 DIAGNOSIS — Z7901 Long term (current) use of anticoagulants: Secondary | ICD-10-CM

## 2019-12-17 ENCOUNTER — Telehealth: Payer: Self-pay | Admitting: Pharmacist

## 2019-12-17 NOTE — Telephone Encounter (Signed)
Called patient on 12/17/2019 at 12:59 PM   Patient complains of having no energy and feeling awful. He is unable to schedule appt on the phone at the moment because he is too drained and cannot think of when he will be available. He states he will have to come into [facility] to get INR checked soon. Advised patient to schedule pharmacy appt the same day as INR check for ease of scheduling appt. Patient verbalized understanding. He states he will contact [facility] to schedule an appt when he can determine when his next INR check should be. Will await for patient to schedule at this time.  Thank you for involving pharmacy to assist in providing this patient's care.   Drexel Iha, PharmD PGY2 Ambulatory Care Pharmacy Resident

## 2019-12-18 ENCOUNTER — Other Ambulatory Visit: Payer: Self-pay | Admitting: Family Medicine

## 2019-12-18 DIAGNOSIS — I10 Essential (primary) hypertension: Secondary | ICD-10-CM

## 2019-12-23 DIAGNOSIS — G4733 Obstructive sleep apnea (adult) (pediatric): Secondary | ICD-10-CM | POA: Diagnosis not present

## 2019-12-29 DIAGNOSIS — J9611 Chronic respiratory failure with hypoxia: Secondary | ICD-10-CM | POA: Diagnosis not present

## 2019-12-29 DIAGNOSIS — I509 Heart failure, unspecified: Secondary | ICD-10-CM | POA: Diagnosis not present

## 2019-12-30 ENCOUNTER — Other Ambulatory Visit: Payer: Self-pay

## 2019-12-30 ENCOUNTER — Ambulatory Visit (INDEPENDENT_AMBULATORY_CARE_PROVIDER_SITE_OTHER): Payer: Medicare HMO | Admitting: Family Medicine

## 2019-12-30 VITALS — BP 118/78 | HR 87 | Ht 69.0 in | Wt 389.4 lb

## 2019-12-30 DIAGNOSIS — Z7901 Long term (current) use of anticoagulants: Secondary | ICD-10-CM

## 2019-12-30 DIAGNOSIS — E1142 Type 2 diabetes mellitus with diabetic polyneuropathy: Secondary | ICD-10-CM

## 2019-12-30 DIAGNOSIS — N529 Male erectile dysfunction, unspecified: Secondary | ICD-10-CM | POA: Diagnosis not present

## 2019-12-30 DIAGNOSIS — M62830 Muscle spasm of back: Secondary | ICD-10-CM | POA: Diagnosis not present

## 2019-12-30 LAB — POCT INR: INR: 2.1 (ref 2.0–3.0)

## 2019-12-30 MED ORDER — SILDENAFIL CITRATE 25 MG PO TABS: 25.0000 mg | ORAL_TABLET | Freq: Every day | ORAL | 0 refills | Status: DC | PRN

## 2019-12-30 MED ORDER — CYCLOBENZAPRINE HCL 10 MG PO TABS: 10.0000 mg | ORAL_TABLET | Freq: Three times a day (TID) | ORAL | 0 refills | Status: DC | PRN

## 2019-12-30 NOTE — Progress Notes (Signed)
SUBJECTIVE:   CHIEF COMPLAINT / HPI:   Back spasm Patient has these intermittently, they do interfere with his desire to try and be as active as possible which is already inhibited by his respiratory status and weight.  Erectile dysfunction Patient does have desire but also has difficulty starting and maintaining erection.  We discussed emergency symptoms of side effects and I prescribed a small low-dose course for him to test for effect  Warfarin anticoagulation Ask for pharmacy team trying to schedule with him to discuss change over to a DOAC.  Patient is very open to this and says that he would like to discuss it with them but he wants to check with his insurance first they cannot confirm the pricing.  He says that he is open to contact and can have a discussion with them.  Type 2 diabetes mellitus with diabetic polyneuropathy, without long-term current use of insulin ([place]) We discussed that the pharmacy team and been trying to reach him to potentially start him on a GLP-1, he wants to have a discussion with him personally about the pricing issues of this if he is concerned about it does not want to start something that he would like to immediately stop if he cannot afford it.  He is open to contact.  He did decline nutrition referral as we had a discussion about weight control being an issue  PERTINENT  PMH / PSH:   OBJECTIVE:   BP 118/78   Pulse 87   Ht _0  (1.753 m)   Wt (!) 389 lb 6.4 oz (176.6 kg)   SpO2 92%   BMI 57.50 kg/m   General: Chronically on 5 L, but status appears to be roughly baseline for him based on my experience Respiratory: Once he is seated tends to sat well as long as he is on his oxygen.  No acute distress, no increased work of breathing beyond his baseline Cardiac: Regular rate and rhythm   ASSESSMENT/PLAN:   Back spasm Patient has these intermittently, they do interfere with his desire to try and be as active as possible which is already  inhibited by his respiratory status and weight.  He does not use these chronically but only uses them when he is having these issues so we can prescribe some Flexeril for him.  This is worked for him in the past  Erectile dysfunction Patient does have desire but also has difficulty starting and maintaining erection.  We discussed emergency symptoms of side effects and I prescribed a small low-dose course for him to test for effect  Warfarin anticoagulation Ask for pharmacy team trying to schedule with him to discuss change over to a DOAC.  Patient is very open to this and says that he would like to discuss it with them but he wants to check with his insurance first they cannot confirm the pricing.  He says that he is open to contact and can have a discussion with them.  Type 2 diabetes mellitus with diabetic polyneuropathy, without long-term current use of insulin ([place]) We discussed that the pharmacy team and been trying to reach him to potentially start him on a GLP-1, he wants to have a discussion with him personally about the pricing issues of this if he is concerned about it does not want to start something that he would like to immediately stop if he cannot afford it.  He is open to contact.  He did decline nutrition referral as we had a discussion  about weight control being an issue     Sherene Sires, [place]

## 2019-12-30 NOTE — Patient Instructions (Addendum)
Schedule [email or web address]  Sildenafil tablets (Erectile Dysfunction) What is this medicine? SILDENAFIL (sil DEN a fil) is used to treat erection problems in men. This medicine may be used for other purposes; ask your health care provider or pharmacist if you have questions. COMMON BRAND NAME(S): Viagra What should I tell my health care provider before I take this medicine? They need to know if you have any of these conditions:  bleeding disorders  eye or vision problems, including a rare inherited eye disease called retinitis pigmentosa  anatomical deformation of the penis, Peyronie's disease, or history of priapism (painful and prolonged erection)  heart disease, angina, a history of heart attack, irregular heart beats, or other heart problems  high or low blood pressure  history of blood diseases, like sickle cell anemia or leukemia  history of stomach bleeding  kidney disease  liver disease  stroke  an unusual or allergic reaction to sildenafil, other medicines, foods, dyes, or preservatives  pregnant or trying to get pregnant  breast-feeding How should I use this medicine? Take this medicine by mouth with a glass of water. Follow the directions on the prescription label. The dose is usually taken 1 hour before sexual activity. You should not take the dose more than once per day. Do not take your medicine more often than directed. Talk to your pediatrician regarding the use of this medicine in children. This medicine is not used in children for this condition. Overdosage: If you think you have taken too much of this medicine contact a poison control center or emergency room at once. NOTE: This medicine is only for you. Do not share this medicine with others. What if I miss a dose? This does not apply. Do not take double or extra doses. What may interact with this medicine? Do not take this  medicine with any of the following medications:  cisapride  nitrates like amyl nitrite, isosorbide dinitrate, isosorbide mononitrate, nitroglycerin  riociguat This medicine may also interact with the following medications:  antiviral medicines for HIV or AIDS  bosentan  certain medicines for benign prostatic hyperplasia (BPH)  certain medicines for blood pressure  certain medicines for fungal infections like ketoconazole and itraconazole  cimetidine  erythromycin  rifampin This list may not describe all possible interactions. Give your health care provider a list of all the medicines, herbs, non-prescription drugs, or dietary supplements you use. Also tell them if you smoke, drink alcohol, or use illegal drugs. Some items may interact with your medicine. What should I watch for while using this medicine? If you notice any changes in your vision while taking this drug, call your doctor or health care professional as soon as possible. Stop using this medicine and call your health care provider right away if you have a loss of sight in one or both eyes. Contact your doctor or health care professional right away if you have an erection that lasts longer than 4 hours or if it becomes painful. This may be a sign of a serious problem and must be treated right away to prevent permanent damage. If you experience symptoms of nausea, dizziness, chest pain or arm pain upon initiation of sexual activity after taking this medicine, you should refrain from further activity and call your doctor or health care professional as soon as possible. Do not drink alcohol to excess (examples, 5 glasses of wine or 5 shots of whiskey) when taking this medicine. When taken in excess, alcohol can increase your chances of getting a headache  or getting dizzy, increasing your heart rate or lowering your blood pressure. Using this medicine does not protect you or your partner against HIV infection (the virus that  causes AIDS) or other sexually transmitted diseases. What side effects may I notice from receiving this medicine? Side effects that you should report to your doctor or health care professional as soon as possible:  allergic reactions like skin rash, itching or hives, swelling of the face, lips, or tongue  breathing problems  changes in hearing  changes in vision  chest pain  fast, irregular heartbeat  prolonged or painful erection  seizures Side effects that usually do not require medical attention (report to your doctor or health care professional if they continue or are bothersome):  back pain  dizziness  flushing  headache  indigestion  muscle aches  nausea  stuffy or runny nose This list may not describe all possible side effects. Call your doctor for medical advice about side effects. You may report side effects to FDA at 1-800-FDA-1088. Where should I keep my medicine? Keep out of reach of children. Store at room temperature between 15 and 30 degrees C (59 and 86 degrees F). Throw away any unused medicine after the expiration date. NOTE: This sheet is a summary. It may not cover all possible information. If you have questions about this medicine, talk to your doctor, pharmacist, or health care provider.  2020 Elsevier/Gold Standard (2015-08-25 12:00:25) Cyclobenzaprine tablets What is this medicine? CYCLOBENZAPRINE (sye kloe BEN za preen) is a muscle relaxer. It is used to treat muscle pain, spasms, and stiffness. This medicine may be used for other purposes; ask your health care provider or pharmacist if you have questions. COMMON BRAND NAME(S): Fexmid, Flexeril What should I tell my health care provider before I take this medicine? They need to know if you have any of these conditions:  heart disease, irregular heartbeat, or previous heart attack  liver disease  thyroid problem  an unusual or allergic reaction to cyclobenzaprine, tricyclic  antidepressants, lactose, other medicines, foods, dyes, or preservatives  pregnant or trying to get pregnant  breast-feeding How should I use this medicine? Take this medicine by mouth with a glass of water. Follow the directions on the prescription label. If this medicine upsets your stomach, take it with food or milk. Take your medicine at regular intervals. Do not take it more often than directed. Talk to your pediatrician regarding the use of this medicine in children. Special care may be needed. Overdosage: If you think you have taken too much of this medicine contact a poison control center or emergency room at once. NOTE: This medicine is only for you. Do not share this medicine with others. What if I miss a dose? If you miss a dose, take it as soon as you can. If it is almost time for your next dose, take only that dose. Do not take double or extra doses. What may interact with this medicine? Do not take this medicine with any of the following medications:  MAOIs like Carbex, Eldepryl, Marplan, Nardil, and Parnate  narcotic medicines for cough  safinamide This medicine may also interact with the following medications:  alcohol  bupropion  antihistamines for allergy, cough and cold  certain medicines for anxiety or sleep  certain medicines for bladder problems like oxybutynin, tolterodine  certain medicines for depression like amitriptyline, fluoxetine, sertraline  certain medicines for Parkinson's disease like benztropine, trihexyphenidyl  certain medicines for seizures like phenobarbital, primidone  certain medicines  for stomach problems like dicyclomine, hyoscyamine  certain medicines for travel sickness like scopolamine  general anesthetics like halothane, isoflurane, methoxyflurane, propofol  ipratropium  local anesthetics like lidocaine, pramoxine, tetracaine  medicines that relax muscles for surgery  narcotic medicines for pain  phenothiazines like  chlorpromazine, mesoridazine, prochlorperazine, thioridazine  verapamil This list may not describe all possible interactions. Give your health care provider a list of all the medicines, herbs, non-prescription drugs, or dietary supplements you use. Also tell them if you smoke, drink alcohol, or use illegal drugs. Some items may interact with your medicine. What should I watch for while using this medicine? Tell your doctor or health care professional if your symptoms do not start to get better or if they get worse. You may get drowsy or dizzy. Do not drive, use machinery, or do anything that needs mental alertness until you know how this medicine affects you. Do not stand or sit up quickly, especially if you are an older patient. This reduces the risk of dizzy or fainting spells. Alcohol may interfere with the effect of this medicine. Avoid alcoholic drinks. If you are taking another medicine that also causes drowsiness, you may have more side effects. Give your health care provider a list of all medicines you use. Your doctor will tell you how much medicine to take. Do not take more medicine than directed. Call emergency for help if you have problems breathing or unusual sleepiness. Your mouth may get dry. Chewing sugarless gum or sucking hard candy, and drinking plenty of water may help. Contact your doctor if the problem does not go away or is severe. What side effects may I notice from receiving this medicine? Side effects that you should report to your doctor or health care professional as soon as possible:  allergic reactions like skin rash, itching or hives, swelling of the face, lips, or tongue  breathing problems  chest pain  fast, irregular heartbeat  hallucinations  seizures  unusually weak or tired Side effects that usually do not require medical attention (report to your doctor or health care professional if they continue or are bothersome):  headache  nausea, vomiting This  list may not describe all possible side effects. Call your doctor for medical advice about side effects. You may report side effects to FDA at 1-800-FDA-1088. Where should I keep my medicine? Keep out of the reach of children. Store at room temperature between 15 and 30 degrees C (59 and 86 degrees F). Keep container tightly closed. Throw away any unused medicine after the expiration date. NOTE: This sheet is a summary. It may not cover all possible information. If you have questions about this medicine, talk to your doctor, pharmacist, or health care provider.  2020 Elsevier/Gold Standard (2018-08-14 12:49:26)

## 2020-01-02 DIAGNOSIS — M62830 Muscle spasm of back: Secondary | ICD-10-CM | POA: Insufficient documentation

## 2020-01-02 DIAGNOSIS — N529 Male erectile dysfunction, unspecified: Secondary | ICD-10-CM | POA: Insufficient documentation

## 2020-01-02 NOTE — Assessment & Plan Note (Signed)
Patient does have desire but also has difficulty starting and maintaining erection.  We discussed emergency symptoms of side effects and I prescribed a small low-dose course for him to test for effect

## 2020-01-02 NOTE — Assessment & Plan Note (Signed)
We discussed that the pharmacy team and been trying to reach him to potentially start him on a GLP-1, he wants to have a discussion with him personally about the pricing issues of this if he is concerned about it does not want to start something that he would like to immediately stop if he cannot afford it.  He is open to contact.  He did decline nutrition referral as we had a discussion about weight control being an issue

## 2020-01-02 NOTE — Assessment & Plan Note (Signed)
Patient has these intermittently, they do interfere with his desire to try and be as active as possible which is already inhibited by his respiratory status and weight.  He does not use these chronically but only uses them when he is having these issues so we can prescribe some Flexeril for him.  This is worked for him in the past

## 2020-01-02 NOTE — Assessment & Plan Note (Signed)
Ask for pharmacy team trying to schedule with him to discuss change over to a DOAC.  Patient is very open to this and says that he would like to discuss it with them but he wants to check with his insurance first they cannot confirm the pricing.  He says that he is open to contact and can have a discussion with them.

## 2020-01-03 DIAGNOSIS — I509 Heart failure, unspecified: Secondary | ICD-10-CM | POA: Diagnosis not present

## 2020-01-03 DIAGNOSIS — G4733 Obstructive sleep apnea (adult) (pediatric): Secondary | ICD-10-CM | POA: Diagnosis not present

## 2020-01-03 DIAGNOSIS — J9621 Acute and chronic respiratory failure with hypoxia: Secondary | ICD-10-CM | POA: Diagnosis not present

## 2020-01-03 DIAGNOSIS — R0902 Hypoxemia: Secondary | ICD-10-CM | POA: Diagnosis not present

## 2020-01-08 ENCOUNTER — Ambulatory Visit (INDEPENDENT_AMBULATORY_CARE_PROVIDER_SITE_OTHER): Payer: Medicare HMO | Admitting: Pharmacist

## 2020-01-08 ENCOUNTER — Other Ambulatory Visit: Payer: Self-pay

## 2020-01-08 ENCOUNTER — Ambulatory Visit: Payer: Medicare HMO | Admitting: Pharmacist

## 2020-01-08 DIAGNOSIS — E1142 Type 2 diabetes mellitus with diabetic polyneuropathy: Secondary | ICD-10-CM

## 2020-01-08 DIAGNOSIS — Z7901 Long term (current) use of anticoagulants: Secondary | ICD-10-CM | POA: Diagnosis not present

## 2020-01-08 LAB — POCT INR: INR: 2.9 (ref 2.0–3.0)

## 2020-01-08 MED ORDER — TRULICITY 0.75 MG/0.5ML ~~LOC~~ SOAJ: 0.7500 mg | SUBCUTANEOUS | 0 refills | Status: DC

## 2020-01-08 NOTE — Patient Instructions (Signed)
Nice to meet you today.   Start Trulicity 3.20QV once weekly.  First dose today.   All other medications the same.   Thanks for discussing Eliquis (Apixaban).  We can meet again to discuss more.   Follow-up with Dr. Criss Rosales.

## 2020-01-09 ENCOUNTER — Encounter: Payer: Self-pay | Admitting: Pharmacist

## 2020-01-09 NOTE — Progress Notes (Signed)
Reviewed: I agree with Dr. Graylin Shiver documentation and management.

## 2020-01-09 NOTE — Progress Notes (Signed)
S:     Chief Complaint  Patient presents with  . Medication Management    Diabetes / Anticoagulation    Patient arrives in good spirits, ambulating with assistance of rolling chair walker and using oxygen condentrator at 4L/min.  Presents for diabetes evaluation, education, and management, potential GLP initiation and conversation about alternative anticoagulation strategies.  Patient was referred and last seen by Primary Care Provider, Dr. Criss Rosales,  on 12/30/2018.   Patient reports Diabetes was diagnosed in 2019.  Insurance coverage/medication affordability: [employer]  Patient reports adherence with medications.  Current diabetes medications include: metformin which he notes causes loose stools which are manageable with current dose.  Notes that glyburide caused hypoglycemia in the past.   Patient denies recent hypoglycemic events.   Patient-reported exercise habits: limited due to body habitus and lung function    O:  Physical Exam Constitutional:      Appearance: He is obese.  Neurological:     Mental Status: He is alert.  Psychiatric:        Mood and Affect: Mood normal.        Thought Content: Thought content normal.        Judgment: Judgment normal.    Review of Systems  Respiratory: Positive for shortness of breath.   Cardiovascular: Positive for palpitations.  Gastrointestinal: Positive for diarrhea.  Musculoskeletal: Positive for back pain and joint pain.  All other systems reviewed and are negative.    Lab Results  Component Value Date   HGBA1C 7.8 (A) 10/08/2019   Vitals:   01/08/20 1555  BP: (!) 114/52  Pulse: 78  SpO2: 93%    Lipid Panel  No results found for: CHOL, TRIG, HDL, CHOLHDL, VLDL, LDLCALC, LDLDIRECT  Home fasting blood sugars: not routinely checking - A1C results have been fairly well controlled.     Clinical Atherosclerotic Cardiovascular Disease (ASCVD): Yes  The ASCVD Risk score Mikey Bussing DC Jr., et al., 2013) failed to  calculate for the following reasons:   Cannot find a previous HDL lab   Cannot find a previous total cholesterol lab    A/P: Diabetes of a few years duration and currently with fair control.  . Patient is able to verbalize appropriate hypoglycemia management plan. Patient appears adherent with medication. Control is suboptimal due to immobility and other chronic disease limitations.  -Initiated a trial of GLP-1 Trulicity (generic name dulaglutide) to help improve glycemic control, and promote weight loss.  -Patient educated on purpose, proper use and potential adverse effects including Nausea/vomiting.  Following instruction patient verbalized understanding of treatment plan and administered first dose in office with sample medication.  No new prescription sent in to pharmacy until assessment of efficacy and tolerability achieved.   ASCVD risk - secondary prevention in patient with diabetes and CHF.  No  LDL on file.   -Plan to obtain cholesterol panel at next lab draw.   Chronic anticoagulation discussed. Patient treatment with xarelto (rivaroxaban) in the past makes switch to apixaban less appealing to the patient.  He is comfortable with  and prefers continuing current treatment plan with warfarin.     Written patient instructions provided.  Total time in face to face counseling 28 minutes.   Follow up PCP Clinic Visit in near future, patient plans to schedule. Patient seen with Maryan Rued, PharmD PGY-1 Resident.        Start Trulicity 7.67MC once weekly.  First dose today.   All other medications the same.   Thanks  for discussing Eliquis (Apixaban).  We can meet again to discuss more.   Follow-up with Dr. Criss Rosales.

## 2020-01-09 NOTE — Assessment & Plan Note (Signed)
Diabetes of a few years duration and currently with fair control.  . Patient is able to verbalize appropriate hypoglycemia management plan. Patient appears adherent with medication. Control is suboptimal due to immobility and other chronic disease limitations.  -Initiated a trial of GLP-1 Trulicity (generic name dulaglutide) to help improve glycemic control, and promote weight loss.  -Patient educated on purpose, proper use and potential adverse effects including Nausea/vomiting.  Following instruction patient verbalized understanding of treatment plan and administered first dose in office with sample medication.  No new prescription sent in to pharmacy until assessment of efficacy and tolerability achieved.

## 2020-01-09 NOTE — Assessment & Plan Note (Signed)
Chronic anticoagulation discussed. Patient treatment with xarelto (rivaroxaban) in the past makes switch to apixaban less appealing to the patient.  He is comfortable with  and prefers continuing current treatment plan with warfarin.

## 2020-01-16 ENCOUNTER — Other Ambulatory Visit: Payer: Self-pay | Admitting: Family Medicine

## 2020-01-16 DIAGNOSIS — E1142 Type 2 diabetes mellitus with diabetic polyneuropathy: Secondary | ICD-10-CM

## 2020-01-21 ENCOUNTER — Other Ambulatory Visit: Payer: Self-pay | Admitting: Family Medicine

## 2020-01-22 ENCOUNTER — Ambulatory Visit: Payer: Medicare HMO

## 2020-01-23 DIAGNOSIS — G4733 Obstructive sleep apnea (adult) (pediatric): Secondary | ICD-10-CM | POA: Diagnosis not present

## 2020-01-28 DIAGNOSIS — J9611 Chronic respiratory failure with hypoxia: Secondary | ICD-10-CM | POA: Diagnosis not present

## 2020-01-28 DIAGNOSIS — I509 Heart failure, unspecified: Secondary | ICD-10-CM | POA: Diagnosis not present

## 2020-02-03 ENCOUNTER — Other Ambulatory Visit: Payer: Self-pay | Admitting: Family Medicine

## 2020-02-03 DIAGNOSIS — Z5181 Encounter for therapeutic drug level monitoring: Secondary | ICD-10-CM

## 2020-02-03 DIAGNOSIS — Z7901 Long term (current) use of anticoagulants: Secondary | ICD-10-CM

## 2020-02-17 DIAGNOSIS — N179 Acute kidney failure, unspecified: Secondary | ICD-10-CM | POA: Diagnosis not present

## 2020-02-17 DIAGNOSIS — I499 Cardiac arrhythmia, unspecified: Secondary | ICD-10-CM | POA: Diagnosis not present

## 2020-02-17 DIAGNOSIS — R52 Pain, unspecified: Secondary | ICD-10-CM | POA: Diagnosis not present

## 2020-02-17 DIAGNOSIS — I2781 Cor pulmonale (chronic): Secondary | ICD-10-CM | POA: Diagnosis not present

## 2020-02-17 DIAGNOSIS — R0602 Shortness of breath: Secondary | ICD-10-CM | POA: Diagnosis not present

## 2020-02-17 DIAGNOSIS — R600 Localized edema: Secondary | ICD-10-CM | POA: Diagnosis not present

## 2020-02-17 DIAGNOSIS — I4821 Permanent atrial fibrillation: Secondary | ICD-10-CM | POA: Diagnosis not present

## 2020-02-17 DIAGNOSIS — I4891 Unspecified atrial fibrillation: Secondary | ICD-10-CM | POA: Diagnosis not present

## 2020-02-17 DIAGNOSIS — I5023 Acute on chronic systolic (congestive) heart failure: Secondary | ICD-10-CM | POA: Diagnosis not present

## 2020-02-17 DIAGNOSIS — I130 Hypertensive heart and chronic kidney disease with heart failure and stage 1 through stage 4 chronic kidney disease, or unspecified chronic kidney disease: Secondary | ICD-10-CM | POA: Diagnosis not present

## 2020-02-17 DIAGNOSIS — Z7401 Bed confinement status: Secondary | ICD-10-CM | POA: Diagnosis not present

## 2020-02-17 DIAGNOSIS — N189 Chronic kidney disease, unspecified: Secondary | ICD-10-CM | POA: Diagnosis not present

## 2020-02-17 DIAGNOSIS — R0902 Hypoxemia: Secondary | ICD-10-CM | POA: Diagnosis not present

## 2020-02-17 DIAGNOSIS — I5043 Acute on chronic combined systolic (congestive) and diastolic (congestive) heart failure: Secondary | ICD-10-CM | POA: Diagnosis not present

## 2020-02-17 DIAGNOSIS — M79604 Pain in right leg: Secondary | ICD-10-CM | POA: Diagnosis not present

## 2020-02-17 DIAGNOSIS — I50813 Acute on chronic right heart failure: Secondary | ICD-10-CM | POA: Diagnosis not present

## 2020-02-17 DIAGNOSIS — J9601 Acute respiratory failure with hypoxia: Secondary | ICD-10-CM | POA: Diagnosis not present

## 2020-02-17 DIAGNOSIS — I272 Pulmonary hypertension, unspecified: Secondary | ICD-10-CM | POA: Diagnosis not present

## 2020-02-17 DIAGNOSIS — E1142 Type 2 diabetes mellitus with diabetic polyneuropathy: Secondary | ICD-10-CM | POA: Diagnosis not present

## 2020-02-17 DIAGNOSIS — R531 Weakness: Secondary | ICD-10-CM | POA: Diagnosis not present

## 2020-02-17 DIAGNOSIS — M79661 Pain in right lower leg: Secondary | ICD-10-CM | POA: Diagnosis not present

## 2020-02-17 DIAGNOSIS — J9611 Chronic respiratory failure with hypoxia: Secondary | ICD-10-CM | POA: Diagnosis not present

## 2020-02-17 DIAGNOSIS — Z6841 Body Mass Index (BMI) 40.0 and over, adult: Secondary | ICD-10-CM | POA: Diagnosis not present

## 2020-02-17 DIAGNOSIS — R918 Other nonspecific abnormal finding of lung field: Secondary | ICD-10-CM | POA: Diagnosis not present

## 2020-02-17 DIAGNOSIS — I110 Hypertensive heart disease with heart failure: Secondary | ICD-10-CM | POA: Diagnosis not present

## 2020-02-23 ENCOUNTER — Other Ambulatory Visit: Payer: Self-pay | Admitting: Family Medicine

## 2020-02-23 DIAGNOSIS — Z5181 Encounter for therapeutic drug level monitoring: Secondary | ICD-10-CM

## 2020-02-23 DIAGNOSIS — Z7901 Long term (current) use of anticoagulants: Secondary | ICD-10-CM

## 2020-02-28 DIAGNOSIS — J9611 Chronic respiratory failure with hypoxia: Secondary | ICD-10-CM | POA: Diagnosis not present

## 2020-02-28 DIAGNOSIS — I509 Heart failure, unspecified: Secondary | ICD-10-CM | POA: Diagnosis not present

## 2020-03-05 ENCOUNTER — Other Ambulatory Visit: Payer: Self-pay | Admitting: Family Medicine

## 2020-03-05 DIAGNOSIS — E1142 Type 2 diabetes mellitus with diabetic polyneuropathy: Secondary | ICD-10-CM

## 2020-03-08 ENCOUNTER — Other Ambulatory Visit: Payer: Self-pay

## 2020-03-09 MED ORDER — METFORMIN HCL 1000 MG PO TABS: 1000.0000 mg | ORAL_TABLET | Freq: Every day | ORAL | 3 refills | Status: DC

## 2020-03-10 ENCOUNTER — Other Ambulatory Visit: Payer: Self-pay

## 2020-03-10 ENCOUNTER — Ambulatory Visit: Payer: Medicare HMO | Admitting: Sports Medicine

## 2020-03-10 ENCOUNTER — Encounter: Payer: Self-pay | Admitting: Sports Medicine

## 2020-03-10 VITALS — BP 113/71 | Ht 69.0 in | Wt 391.0 lb

## 2020-03-10 DIAGNOSIS — M170 Bilateral primary osteoarthritis of knee: Secondary | ICD-10-CM

## 2020-03-10 MED ORDER — METHYLPREDNISOLONE ACETATE 40 MG/ML IJ SUSP
40.0000 mg | Freq: Once | INTRAMUSCULAR | Status: AC
  Administered 2020-03-10: 40 mg via INTRA_ARTICULAR

## 2020-03-10 NOTE — Progress Notes (Addendum)
PCP: Sherene Sires, DO  Subjective:   HPI: Patient is a 59 y.o. male here for bilateral knee osteoarthritis.  Patient has been getting intermittent corticosteroid injections.  Patient will get roughly 2 to 3 months of benefit from them.  Patient's last set of injections was in February.  Patient notes the pain has started to come back.  Locate along medial joint line.  Does not radiate.  Intermittent swelling.  Unable to do a knee replacement surgery due to BMI and medical comorbidities.  Patient is interested and viscosupplementation injections in the future as well.   Review of Systems: See HPI above.  Past Medical History:  Diagnosis Date  . Asthma   . CHF (congestive heart failure) ([place])   . COPD (chronic obstructive pulmonary disease) ([place])   . Diabetes mellitus without complication ([place])   . Hypertension     Current Outpatient Medications on File Prior to Visit  Medication Sig Dispense Refill  . ACCU-CHEK AVIVA PLUS test strip TEST TWO TIMES DAILY 200 strip 1  . Accu-Chek Softclix Lancets lancets USE TO TEST TWO TIMES DAILY 200 each 3  . acetaminophen (TYLENOL) 325 MG tablet Take 2 tablets (650 mg total) by mouth every 6 (six) hours as needed. 60 tablet 0  . Alcohol Swabs (B-D SINGLE USE SWABS REGULAR) PADS USE TWO TIMES DAILY 200 each 3  . amLODipine (NORVASC) 5 MG tablet TAKE 1 TABLET EVERY DAY 90 tablet 1  . Blood Glucose Monitoring Suppl (ACCU-CHEK AVIVA PLUS) w/Device KIT TEST TWO TIMES DAILY 1 kit 0  . cholecalciferol (VITAMIN D) 1000 units tablet Take 1 tablet (1,000 Units total) by mouth daily. (Patient not taking: Reported on 01/09/2020) 90 tablet 2  . cyclobenzaprine (FLEXERIL) 10 MG tablet Take 1 tablet (10 mg total) by mouth 3 (three) times daily as needed for muscle spasms. 30 tablet 0  . Dulaglutide (TRULICITY) 2.80 KL/4.9ZP SOPN Inject 0.75 mg into the skin once a week. 4 pen 0  . Elastic Bandages & Supports (WRIST SUPPORT/ELASTIC/FIRM LG) MISC     . ferrous sulfate  325 (65 FE) MG tablet Take 325 mg by mouth daily.    Marland Kitchen gabapentin (NEURONTIN) 100 MG capsule TAKE 1 CAPSULE THREE TIMES DAILY 90 capsule 0  . ipratropium-albuterol (DUONEB) 0.5-2.5 (3) MG/3ML SOLN Take 3 mLs by nebulization every 4 (four) hours as needed. (Patient not taking: Reported on 01/08/2020) 360 mL 3  . lisinopril (ZESTRIL) 10 MG tablet TAKE 1 TABLET EVERY DAY 90 tablet 1  . metFORMIN (GLUCOPHAGE) 1000 MG tablet Take 1 tablet (1,000 mg total) by mouth daily. 90 tablet 3  . Multiple Vitamin (MULTIVITAMIN WITH MINERALS) TABS tablet Take 1 tablet by mouth daily.    . OXYGEN Inhale 5 L into the lungs continuous.    Marland Kitchen PRESCRIPTION MEDICATION Inhale into the lungs as needed (whenever sleeping - naps or at night). CPAP    . sildenafil (VIAGRA) 25 MG tablet Take 1 tablet (25 mg total) by mouth daily as needed for erectile dysfunction. (Patient not taking: Reported on 01/08/2020) 10 tablet 0  . torsemide (DEMADEX) 20 MG tablet TAKE 3 TABLETS (60 MG TOTAL) BY MOUTH AT BEDTIME. 270 tablet 1  . warfarin (COUMADIN) 5 MG tablet TAKE 2 TABLETS EVERY DAY.  PLEASE PRESENT TO THE CLINIC FOR AN INR CHECK EVERY WEEK UNTIL THERAPEUTIC CONSISTENTLY 60 tablet 0   No current facility-administered medications on file prior to visit.    Past Surgical History:  Procedure Laterality Date  . SHOULDER  SURGERY      No Known Allergies  Social History   Socioeconomic History  . Marital status: Divorced    Spouse name: Not on file  . Number of children: 2  . Years of education: 12th grade  . Highest education level: High school graduate  Occupational History  . 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 Smoking status: Never Smoker  . Smokeless tobacco: Never Used  Vaping Use  . Vaping Use: Never used  Substance and Sexual Activity  . Alcohol use: No  . Drug use: No  . Sexual activity: Not Currently    Birth control/protection: Condom  Other Topics Concern  . Not  on file  Social History Narrative   Lives in senior apartments, lives alone. Ground floor. Has social activities daily. Uses treadmill. No steps, grab rails in bathroom, smoke alarm, wears seat belt. No firearms.    Social Determinants of Health   Financial Resource Strain:   . Difficulty of Paying Living Expenses:   Food Insecurity:   . Worried About Charity fundraiser in the Last Year:   . Arboriculturist in the Last Year:   Transportation Needs:   . Film/video editor (Medical):   Marland Kitchen Lack of Transportation (Non-Medical):   Physical Activity:   . Days of Exercise per Week:   . Minutes of Exercise per Session:   Stress:   . Feeling of Stress :   Social Connections:   . Frequency of Communication with Friends and Family:   . Frequency of Social Gatherings with Friends and Family:   . Attends Religious Services:   . Active Member of Clubs or Organizations:   . Attends Archivist Meetings:   Marland Kitchen Marital Status:   Intimate Partner Violence:   . Fear of Current or Ex-Partner:   . Emotionally Abused:   Marland Kitchen Physically Abused:   . Sexually Abused:     Family History  Problem Relation Age of Onset  . Diabetes Mother   . Cancer Mother   . Diabetes Father   . High blood pressure Father   . Diabetes Brother   . Diabetes Brother         Objective:  Physical Exam: BP 113/71   Ht _0  (1.753 m)   Wt (!) 391 lb (177.4 kg)   BMI 57.74 kg/m  Gen: NAD, comfortable in exam room Lungs: Breathing comfortably on room air Knee Exam Bilateral -Inspection: no deformity, no discoloration -Palpation: Medial joint line tenderness -ROM: Extension: 10 degrees; Flexion: 110 degrees -Strength: Extension: 5/5; Flexion: 5/5 -Special Tests: Varus Stress: Negative; Valgus Stress: Negative -Limb neurovascularly intact    Assessment & Plan:  Patient is a 59 y.o. male here for bilateral knee osteoarthritis  1.  Bilateral knee osteoarthritis -Consent was obtained for  corticosteroid injection of the bilateral knees.  Timeout was performed prior to the procedure.  Skin was cleaned and prepped using alcohol wipes.  Following this 40 mg of Depo-Medrol and 3 cc of lidocaine were injected into the suprapatellar pouch using the superior lateral approach.  Ultrasound guidance and sterile technique was used for the injections.  Patient tolerated the injections well there are no complications. -Patient will continue to take Tylenol as needed for pain -We will proceed with getting prior authorization for viscosupplementation injections  Patient will follow up after viscosupplementation is approved  I was the preceptor for this visit and available for immediate consultation Christia Reading  Yvetta Coder, DO

## 2020-03-15 DIAGNOSIS — I272 Pulmonary hypertension, unspecified: Secondary | ICD-10-CM | POA: Diagnosis not present

## 2020-03-15 DIAGNOSIS — R0600 Dyspnea, unspecified: Secondary | ICD-10-CM | POA: Diagnosis not present

## 2020-03-22 DIAGNOSIS — M25761 Osteophyte, right knee: Secondary | ICD-10-CM | POA: Diagnosis not present

## 2020-03-22 DIAGNOSIS — M25561 Pain in right knee: Secondary | ICD-10-CM | POA: Diagnosis not present

## 2020-03-22 DIAGNOSIS — M170 Bilateral primary osteoarthritis of knee: Secondary | ICD-10-CM | POA: Diagnosis not present

## 2020-03-22 DIAGNOSIS — M1711 Unilateral primary osteoarthritis, right knee: Secondary | ICD-10-CM | POA: Diagnosis not present

## 2020-03-22 DIAGNOSIS — M222X1 Patellofemoral disorders, right knee: Secondary | ICD-10-CM | POA: Diagnosis not present

## 2020-03-23 DIAGNOSIS — I5043 Acute on chronic combined systolic (congestive) and diastolic (congestive) heart failure: Secondary | ICD-10-CM | POA: Diagnosis not present

## 2020-03-23 DIAGNOSIS — I4891 Unspecified atrial fibrillation: Secondary | ICD-10-CM | POA: Diagnosis not present

## 2020-03-23 DIAGNOSIS — E1142 Type 2 diabetes mellitus with diabetic polyneuropathy: Secondary | ICD-10-CM | POA: Diagnosis not present

## 2020-03-23 DIAGNOSIS — J9611 Chronic respiratory failure with hypoxia: Secondary | ICD-10-CM | POA: Diagnosis not present

## 2020-03-23 DIAGNOSIS — G4733 Obstructive sleep apnea (adult) (pediatric): Secondary | ICD-10-CM | POA: Diagnosis not present

## 2020-03-23 DIAGNOSIS — I2781 Cor pulmonale (chronic): Secondary | ICD-10-CM | POA: Diagnosis not present

## 2020-03-23 DIAGNOSIS — I10 Essential (primary) hypertension: Secondary | ICD-10-CM | POA: Diagnosis not present

## 2020-03-23 DIAGNOSIS — N1831 Chronic kidney disease, stage 3a: Secondary | ICD-10-CM | POA: Diagnosis not present

## 2020-03-29 DIAGNOSIS — J9611 Chronic respiratory failure with hypoxia: Secondary | ICD-10-CM | POA: Diagnosis not present

## 2020-03-29 DIAGNOSIS — I509 Heart failure, unspecified: Secondary | ICD-10-CM | POA: Diagnosis not present

## 2020-03-31 DIAGNOSIS — M1711 Unilateral primary osteoarthritis, right knee: Secondary | ICD-10-CM | POA: Diagnosis not present

## 2020-03-31 DIAGNOSIS — I5043 Acute on chronic combined systolic (congestive) and diastolic (congestive) heart failure: Secondary | ICD-10-CM | POA: Diagnosis not present

## 2020-03-31 DIAGNOSIS — M1712 Unilateral primary osteoarthritis, left knee: Secondary | ICD-10-CM | POA: Diagnosis not present

## 2020-04-02 ENCOUNTER — Other Ambulatory Visit: Payer: Self-pay | Admitting: Family Medicine

## 2020-04-02 DIAGNOSIS — Z7901 Long term (current) use of anticoagulants: Secondary | ICD-10-CM

## 2020-04-02 DIAGNOSIS — Z5181 Encounter for therapeutic drug level monitoring: Secondary | ICD-10-CM

## 2020-04-05 DIAGNOSIS — M1711 Unilateral primary osteoarthritis, right knee: Secondary | ICD-10-CM | POA: Diagnosis not present

## 2020-04-13 DIAGNOSIS — M1712 Unilateral primary osteoarthritis, left knee: Secondary | ICD-10-CM | POA: Diagnosis not present

## 2020-04-14 ENCOUNTER — Other Ambulatory Visit: Payer: Self-pay

## 2020-04-14 ENCOUNTER — Other Ambulatory Visit: Payer: Self-pay | Admitting: Family Medicine

## 2020-04-14 DIAGNOSIS — E1142 Type 2 diabetes mellitus with diabetic polyneuropathy: Secondary | ICD-10-CM

## 2020-04-14 MED ORDER — GABAPENTIN 100 MG PO CAPS: ORAL_CAPSULE | ORAL | 0 refills | Status: DC

## 2020-04-20 DIAGNOSIS — M1711 Unilateral primary osteoarthritis, right knee: Secondary | ICD-10-CM | POA: Diagnosis not present

## 2020-04-22 DIAGNOSIS — M1712 Unilateral primary osteoarthritis, left knee: Secondary | ICD-10-CM | POA: Diagnosis not present

## 2020-04-26 ENCOUNTER — Other Ambulatory Visit: Payer: Self-pay | Admitting: Family Medicine

## 2020-04-26 DIAGNOSIS — Z5181 Encounter for therapeutic drug level monitoring: Secondary | ICD-10-CM

## 2020-04-26 DIAGNOSIS — Z7901 Long term (current) use of anticoagulants: Secondary | ICD-10-CM

## 2020-04-29 DIAGNOSIS — I509 Heart failure, unspecified: Secondary | ICD-10-CM | POA: Diagnosis not present

## 2020-04-29 DIAGNOSIS — J9611 Chronic respiratory failure with hypoxia: Secondary | ICD-10-CM | POA: Diagnosis not present

## 2020-05-04 DIAGNOSIS — M1711 Unilateral primary osteoarthritis, right knee: Secondary | ICD-10-CM | POA: Diagnosis not present

## 2020-05-05 DIAGNOSIS — I272 Pulmonary hypertension, unspecified: Secondary | ICD-10-CM | POA: Diagnosis not present

## 2020-05-05 DIAGNOSIS — J9611 Chronic respiratory failure with hypoxia: Secondary | ICD-10-CM | POA: Diagnosis not present

## 2020-05-05 DIAGNOSIS — Z20822 Contact with and (suspected) exposure to covid-19: Secondary | ICD-10-CM | POA: Diagnosis not present

## 2020-05-05 DIAGNOSIS — I4821 Permanent atrial fibrillation: Secondary | ICD-10-CM | POA: Diagnosis not present

## 2020-05-05 DIAGNOSIS — I2781 Cor pulmonale (chronic): Secondary | ICD-10-CM | POA: Diagnosis not present

## 2020-05-05 DIAGNOSIS — I130 Hypertensive heart and chronic kidney disease with heart failure and stage 1 through stage 4 chronic kidney disease, or unspecified chronic kidney disease: Secondary | ICD-10-CM | POA: Diagnosis not present

## 2020-05-05 DIAGNOSIS — R7989 Other specified abnormal findings of blood chemistry: Secondary | ICD-10-CM | POA: Diagnosis not present

## 2020-05-05 DIAGNOSIS — J9610 Chronic respiratory failure, unspecified whether with hypoxia or hypercapnia: Secondary | ICD-10-CM | POA: Diagnosis not present

## 2020-05-05 DIAGNOSIS — J9622 Acute and chronic respiratory failure with hypercapnia: Secondary | ICD-10-CM | POA: Diagnosis not present

## 2020-05-05 DIAGNOSIS — Z6841 Body Mass Index (BMI) 40.0 and over, adult: Secondary | ICD-10-CM | POA: Diagnosis not present

## 2020-05-05 DIAGNOSIS — N183 Chronic kidney disease, stage 3 unspecified: Secondary | ICD-10-CM | POA: Diagnosis not present

## 2020-05-05 DIAGNOSIS — I214 Non-ST elevation (NSTEMI) myocardial infarction: Secondary | ICD-10-CM | POA: Diagnosis not present

## 2020-05-05 DIAGNOSIS — J9621 Acute and chronic respiratory failure with hypoxia: Secondary | ICD-10-CM | POA: Diagnosis not present

## 2020-05-05 DIAGNOSIS — Z9989 Dependence on other enabling machines and devices: Secondary | ICD-10-CM | POA: Diagnosis not present

## 2020-05-05 DIAGNOSIS — R0600 Dyspnea, unspecified: Secondary | ICD-10-CM | POA: Diagnosis not present

## 2020-05-05 DIAGNOSIS — E1142 Type 2 diabetes mellitus with diabetic polyneuropathy: Secondary | ICD-10-CM | POA: Diagnosis not present

## 2020-05-05 DIAGNOSIS — I248 Other forms of acute ischemic heart disease: Secondary | ICD-10-CM | POA: Diagnosis not present

## 2020-05-05 DIAGNOSIS — J449 Chronic obstructive pulmonary disease, unspecified: Secondary | ICD-10-CM | POA: Diagnosis not present

## 2020-05-05 DIAGNOSIS — N189 Chronic kidney disease, unspecified: Secondary | ICD-10-CM | POA: Diagnosis not present

## 2020-05-05 DIAGNOSIS — R21 Rash and other nonspecific skin eruption: Secondary | ICD-10-CM | POA: Diagnosis not present

## 2020-05-05 DIAGNOSIS — I509 Heart failure, unspecified: Secondary | ICD-10-CM | POA: Diagnosis not present

## 2020-05-05 DIAGNOSIS — R778 Other specified abnormalities of plasma proteins: Secondary | ICD-10-CM | POA: Diagnosis not present

## 2020-05-05 DIAGNOSIS — G4733 Obstructive sleep apnea (adult) (pediatric): Secondary | ICD-10-CM | POA: Diagnosis not present

## 2020-05-05 DIAGNOSIS — E119 Type 2 diabetes mellitus without complications: Secondary | ICD-10-CM | POA: Diagnosis not present

## 2020-05-05 DIAGNOSIS — I50813 Acute on chronic right heart failure: Secondary | ICD-10-CM | POA: Diagnosis not present

## 2020-05-05 DIAGNOSIS — Z7901 Long term (current) use of anticoagulants: Secondary | ICD-10-CM | POA: Diagnosis not present

## 2020-05-05 DIAGNOSIS — I10 Essential (primary) hypertension: Secondary | ICD-10-CM | POA: Diagnosis not present

## 2020-05-05 DIAGNOSIS — R069 Unspecified abnormalities of breathing: Secondary | ICD-10-CM | POA: Diagnosis not present

## 2020-05-05 DIAGNOSIS — R609 Edema, unspecified: Secondary | ICD-10-CM | POA: Diagnosis not present

## 2020-05-05 DIAGNOSIS — I4891 Unspecified atrial fibrillation: Secondary | ICD-10-CM | POA: Diagnosis not present

## 2020-05-05 DIAGNOSIS — Z9981 Dependence on supplemental oxygen: Secondary | ICD-10-CM | POA: Diagnosis not present

## 2020-05-05 DIAGNOSIS — R9431 Abnormal electrocardiogram [ECG] [EKG]: Secondary | ICD-10-CM | POA: Diagnosis not present

## 2020-05-05 DIAGNOSIS — R079 Chest pain, unspecified: Secondary | ICD-10-CM | POA: Diagnosis not present

## 2020-05-05 DIAGNOSIS — R0602 Shortness of breath: Secondary | ICD-10-CM | POA: Diagnosis not present

## 2020-05-05 DIAGNOSIS — I517 Cardiomegaly: Secondary | ICD-10-CM | POA: Diagnosis not present

## 2020-05-05 DIAGNOSIS — R0902 Hypoxemia: Secondary | ICD-10-CM | POA: Diagnosis not present

## 2020-05-05 DIAGNOSIS — I5043 Acute on chronic combined systolic (congestive) and diastolic (congestive) heart failure: Secondary | ICD-10-CM | POA: Diagnosis not present

## 2020-05-25 DIAGNOSIS — I130 Hypertensive heart and chronic kidney disease with heart failure and stage 1 through stage 4 chronic kidney disease, or unspecified chronic kidney disease: Secondary | ICD-10-CM | POA: Diagnosis not present

## 2020-05-25 DIAGNOSIS — I2781 Cor pulmonale (chronic): Secondary | ICD-10-CM | POA: Diagnosis not present

## 2020-05-25 DIAGNOSIS — M170 Bilateral primary osteoarthritis of knee: Secondary | ICD-10-CM | POA: Diagnosis not present

## 2020-05-25 DIAGNOSIS — N183 Chronic kidney disease, stage 3 unspecified: Secondary | ICD-10-CM | POA: Diagnosis not present

## 2020-05-25 DIAGNOSIS — I4821 Permanent atrial fibrillation: Secondary | ICD-10-CM | POA: Diagnosis not present

## 2020-05-25 DIAGNOSIS — I5043 Acute on chronic combined systolic (congestive) and diastolic (congestive) heart failure: Secondary | ICD-10-CM | POA: Diagnosis not present

## 2020-05-25 DIAGNOSIS — G4733 Obstructive sleep apnea (adult) (pediatric): Secondary | ICD-10-CM | POA: Diagnosis not present

## 2020-05-25 DIAGNOSIS — E1142 Type 2 diabetes mellitus with diabetic polyneuropathy: Secondary | ICD-10-CM | POA: Diagnosis not present

## 2020-05-25 DIAGNOSIS — E1122 Type 2 diabetes mellitus with diabetic chronic kidney disease: Secondary | ICD-10-CM | POA: Diagnosis not present

## 2020-05-27 ENCOUNTER — Telehealth: Payer: Self-pay

## 2020-05-27 DIAGNOSIS — I4821 Permanent atrial fibrillation: Secondary | ICD-10-CM | POA: Diagnosis not present

## 2020-05-27 DIAGNOSIS — M170 Bilateral primary osteoarthritis of knee: Secondary | ICD-10-CM | POA: Diagnosis not present

## 2020-05-27 DIAGNOSIS — N183 Chronic kidney disease, stage 3 unspecified: Secondary | ICD-10-CM | POA: Diagnosis not present

## 2020-05-27 DIAGNOSIS — I5043 Acute on chronic combined systolic (congestive) and diastolic (congestive) heart failure: Secondary | ICD-10-CM | POA: Diagnosis not present

## 2020-05-27 DIAGNOSIS — I2781 Cor pulmonale (chronic): Secondary | ICD-10-CM | POA: Diagnosis not present

## 2020-05-27 DIAGNOSIS — I130 Hypertensive heart and chronic kidney disease with heart failure and stage 1 through stage 4 chronic kidney disease, or unspecified chronic kidney disease: Secondary | ICD-10-CM | POA: Diagnosis not present

## 2020-05-27 DIAGNOSIS — E1142 Type 2 diabetes mellitus with diabetic polyneuropathy: Secondary | ICD-10-CM | POA: Diagnosis not present

## 2020-05-27 DIAGNOSIS — E1122 Type 2 diabetes mellitus with diabetic chronic kidney disease: Secondary | ICD-10-CM | POA: Diagnosis not present

## 2020-05-27 DIAGNOSIS — G4733 Obstructive sleep apnea (adult) (pediatric): Secondary | ICD-10-CM | POA: Diagnosis not present

## 2020-05-27 NOTE — Telephone Encounter (Signed)
Levada Dy from [facility] for nursing verbal orders as follows:  1 time(s) weekly for 1 week(s), then 2 time(s) weekly for 2 week(s), then 1 time (s) weekly for 5 weeks.   Also received phone call from [place] with [facility] regarding physical therapy consult. Patient refused session and stated that he does not want to do anything that will make knees hurt. No more PT services to be ordered at this time.   Verbal orders given per [facility] protocol  Talbot Grumbling, RN

## 2020-05-30 DIAGNOSIS — J9611 Chronic respiratory failure with hypoxia: Secondary | ICD-10-CM | POA: Diagnosis not present

## 2020-05-30 DIAGNOSIS — I509 Heart failure, unspecified: Secondary | ICD-10-CM | POA: Diagnosis not present

## 2020-06-02 ENCOUNTER — Telehealth: Payer: Self-pay

## 2020-06-02 NOTE — Telephone Encounter (Signed)
Patient calls nurse line requesting an appointment to discuss medications for heart failure, specifically torsemide. Patient states that he was recently hospitalized in August due to fluid overload.   Patient want to follow up to see if medications need to be adjusted. PCP had no availabilities until October. Patient requested sooner appointment. Scheduled in ATC on Monday afternoon. Patient denies [facility] currently and does not appear to be in acute distress.   Strict ED precautions given.   Talbot Grumbling, RN

## 2020-06-04 ENCOUNTER — Telehealth: Payer: Self-pay

## 2020-06-04 NOTE — Telephone Encounter (Signed)
Received phone call from [place]orne [place], [place] with [facility] regarding patient. Patient is refusing home health visits until he talks with doctor regarding if services are appropriate for him. Patient has concerns with insurance covering services or if he will have to pay out of pocket.   Patient has an appointment on 9/13 in which he wishes to discuss necessity for home health services.   RN states that services have been put on hold until patient discusses with provider.   FYI to PCP  Talbot Grumbling, RN

## 2020-06-06 DIAGNOSIS — Z09 Encounter for follow-up examination after completed treatment for conditions other than malignant neoplasm: Secondary | ICD-10-CM | POA: Insufficient documentation

## 2020-06-06 NOTE — Progress Notes (Signed)
Patient no showed his appointment on 06/07/2020 for hospital follow-up.  Patient also no-show to appointment 01/22/2020.  Patient called at [phone]. Patient reports he had an issue with his transportation on 03/07/2020, asked him to let us know if he has issues with transportation so that we can help him with this.  Letter sent to patient's home 06/08/2020.  A copy of this letter was also sent directly to the patient via his MyChart account.  Patient has appointment on 06/10/2020 with Dr. Lyndee Hensen.     Milus Banister, [place], PGY-3 06/08/2020 9:44 AM

## 2020-06-07 ENCOUNTER — Ambulatory Visit (INDEPENDENT_AMBULATORY_CARE_PROVIDER_SITE_OTHER): Payer: Medicare HMO | Admitting: Family Medicine

## 2020-06-07 DIAGNOSIS — Z5329 Procedure and treatment not carried out because of patient's decision for other reasons: Secondary | ICD-10-CM

## 2020-06-08 DIAGNOSIS — Z5329 Procedure and treatment not carried out because of patient's decision for other reasons: Secondary | ICD-10-CM | POA: Insufficient documentation

## 2020-06-08 DIAGNOSIS — Z91199 Patient's noncompliance with other medical treatment and regimen due to unspecified reason: Secondary | ICD-10-CM | POA: Insufficient documentation

## 2020-06-10 ENCOUNTER — Ambulatory Visit: Payer: Medicare HMO | Admitting: Family Medicine

## 2020-06-11 ENCOUNTER — Telehealth: Payer: Self-pay | Admitting: *Deleted

## 2020-06-11 DIAGNOSIS — R911 Solitary pulmonary nodule: Secondary | ICD-10-CM | POA: Diagnosis not present

## 2020-06-11 DIAGNOSIS — I4891 Unspecified atrial fibrillation: Secondary | ICD-10-CM | POA: Diagnosis not present

## 2020-06-11 DIAGNOSIS — I130 Hypertensive heart and chronic kidney disease with heart failure and stage 1 through stage 4 chronic kidney disease, or unspecified chronic kidney disease: Secondary | ICD-10-CM | POA: Diagnosis not present

## 2020-06-11 DIAGNOSIS — Z6841 Body Mass Index (BMI) 40.0 and over, adult: Secondary | ICD-10-CM | POA: Diagnosis not present

## 2020-06-11 DIAGNOSIS — I2781 Cor pulmonale (chronic): Secondary | ICD-10-CM | POA: Diagnosis not present

## 2020-06-11 DIAGNOSIS — E1122 Type 2 diabetes mellitus with diabetic chronic kidney disease: Secondary | ICD-10-CM | POA: Diagnosis not present

## 2020-06-11 DIAGNOSIS — Z23 Encounter for immunization: Secondary | ICD-10-CM | POA: Diagnosis not present

## 2020-06-11 DIAGNOSIS — J449 Chronic obstructive pulmonary disease, unspecified: Secondary | ICD-10-CM | POA: Diagnosis not present

## 2020-06-11 DIAGNOSIS — I272 Pulmonary hypertension, unspecified: Secondary | ICD-10-CM | POA: Diagnosis not present

## 2020-06-11 DIAGNOSIS — R0600 Dyspnea, unspecified: Secondary | ICD-10-CM | POA: Diagnosis not present

## 2020-06-11 DIAGNOSIS — G4733 Obstructive sleep apnea (adult) (pediatric): Secondary | ICD-10-CM | POA: Diagnosis not present

## 2020-06-11 DIAGNOSIS — I5043 Acute on chronic combined systolic (congestive) and diastolic (congestive) heart failure: Secondary | ICD-10-CM | POA: Diagnosis not present

## 2020-06-11 NOTE — Telephone Encounter (Signed)
Received fax from [facility] stating that pt meets criteria for the Centers for Medicare and Medicaid service Stat Ratings measure Statin use in person with Diabetes. Recommended ones are atorvastatin, rosuvastatin, pravastatin,or lovastatin (maximum strength tolerable by pt. This is just suggesting that he should be on one if PCP thinks they should be.Seretha Estabrooks Zimmerman Rumple, CMA

## 2020-06-14 DIAGNOSIS — I700 Atherosclerosis of aorta: Secondary | ICD-10-CM | POA: Diagnosis not present

## 2020-06-14 DIAGNOSIS — E278 Other specified disorders of adrenal gland: Secondary | ICD-10-CM | POA: Diagnosis not present

## 2020-06-14 DIAGNOSIS — R911 Solitary pulmonary nodule: Secondary | ICD-10-CM | POA: Diagnosis not present

## 2020-06-22 DIAGNOSIS — R0600 Dyspnea, unspecified: Secondary | ICD-10-CM | POA: Diagnosis not present

## 2020-06-22 DIAGNOSIS — R0609 Other forms of dyspnea: Secondary | ICD-10-CM | POA: Diagnosis not present

## 2020-06-22 DIAGNOSIS — R918 Other nonspecific abnormal finding of lung field: Secondary | ICD-10-CM | POA: Diagnosis not present

## 2020-06-24 ENCOUNTER — Other Ambulatory Visit: Payer: Self-pay | Admitting: *Deleted

## 2020-06-24 DIAGNOSIS — I4891 Unspecified atrial fibrillation: Secondary | ICD-10-CM | POA: Diagnosis not present

## 2020-06-24 DIAGNOSIS — I10 Essential (primary) hypertension: Secondary | ICD-10-CM | POA: Diagnosis not present

## 2020-06-24 DIAGNOSIS — G4733 Obstructive sleep apnea (adult) (pediatric): Secondary | ICD-10-CM | POA: Diagnosis not present

## 2020-06-24 DIAGNOSIS — I5032 Chronic diastolic (congestive) heart failure: Secondary | ICD-10-CM | POA: Diagnosis not present

## 2020-06-24 DIAGNOSIS — I272 Pulmonary hypertension, unspecified: Secondary | ICD-10-CM | POA: Diagnosis not present

## 2020-06-28 DIAGNOSIS — R0602 Shortness of breath: Secondary | ICD-10-CM | POA: Diagnosis not present

## 2020-06-29 ENCOUNTER — Telehealth: Payer: Self-pay | Admitting: Family Medicine

## 2020-06-29 DIAGNOSIS — J9611 Chronic respiratory failure with hypoxia: Secondary | ICD-10-CM | POA: Diagnosis not present

## 2020-06-29 DIAGNOSIS — I509 Heart failure, unspecified: Secondary | ICD-10-CM | POA: Diagnosis not present

## 2020-06-29 NOTE — Telephone Encounter (Signed)
Spoke with patient regarding multiple no-shows. Re-iterated our policy and that he will be dismissed from our practice if he no-shows his next appointment. Patient voiced understanding and stated if we "kick him out of the practice he's not going to cry about it".   Patient was reminded of his appointment tomorrow at 3:15pm.

## 2020-06-30 ENCOUNTER — Other Ambulatory Visit: Payer: Self-pay

## 2020-06-30 ENCOUNTER — Encounter: Payer: Self-pay | Admitting: Family Medicine

## 2020-06-30 ENCOUNTER — Ambulatory Visit (INDEPENDENT_AMBULATORY_CARE_PROVIDER_SITE_OTHER): Payer: Medicare HMO | Admitting: Family Medicine

## 2020-06-30 VITALS — BP 110/62 | Ht 69.0 in | Wt 378.2 lb

## 2020-06-30 DIAGNOSIS — E1142 Type 2 diabetes mellitus with diabetic polyneuropathy: Secondary | ICD-10-CM

## 2020-06-30 DIAGNOSIS — I4891 Unspecified atrial fibrillation: Secondary | ICD-10-CM

## 2020-06-30 DIAGNOSIS — Z5181 Encounter for therapeutic drug level monitoring: Secondary | ICD-10-CM

## 2020-06-30 DIAGNOSIS — Z7901 Long term (current) use of anticoagulants: Secondary | ICD-10-CM | POA: Diagnosis not present

## 2020-06-30 DIAGNOSIS — I10 Essential (primary) hypertension: Secondary | ICD-10-CM

## 2020-06-30 DIAGNOSIS — I5042 Chronic combined systolic (congestive) and diastolic (congestive) heart failure: Secondary | ICD-10-CM | POA: Diagnosis not present

## 2020-06-30 LAB — POCT GLYCOSYLATED HEMOGLOBIN (HGB A1C): HbA1c, POC (controlled diabetic range): 7.4 % — AB (ref 0.0–7.0)

## 2020-06-30 LAB — POCT INR: INR: 4.6 — AB (ref 2.0–3.0)

## 2020-06-30 MED ORDER — GABAPENTIN 100 MG PO CAPS: ORAL_CAPSULE | ORAL | 0 refills | Status: DC

## 2020-06-30 MED ORDER — TORSEMIDE 20 MG PO TABS: 60.0000 mg | ORAL_TABLET | Freq: Every day | ORAL | 0 refills | Status: DC

## 2020-06-30 MED ORDER — APIXABAN 5 MG PO TABS: 5.0000 mg | ORAL_TABLET | Freq: Two times a day (BID) | ORAL | 0 refills | Status: DC

## 2020-06-30 NOTE — Patient Instructions (Signed)
It was great to meet you!  Our plans for today: -STOP taking your Coumadin, effective immediately. -On Monday Oct. 11th, START taking Eliquis 7m twice daily  As discussed, it would be best to establish [facility] since your other doctors are there.  Take care and seek immediate care sooner if you develop any concerns.  Dr. WEdrick KinsFamily Medicine

## 2020-06-30 NOTE — Progress Notes (Signed)
a1

## 2020-06-30 NOTE — Progress Notes (Signed)
    SUBJECTIVE:   CHIEF COMPLAINT / HPI:   Anticoagulation Management, Medication Refills Patient presents requesting refills on several of his medications. Patient needs refills on his Coumadin, Torsemide, and Gabapentin. He was told he needed to be seen in the office for INR check prior to receiving Coumadin refill.  Type 2 Diabetes Patient has a history of T2DM and is due for an A1c check. He takes Metformin 1058m daily at home with excellent compliance. Had previously been on Trulicity for 1 month in April 2021, but stopped taking it due to cost/lack of insurance coverage.  PERTINENT  PMH / PSH: T2DM, CHF with multiple exacerbations/hospitalizations, HTN, a-fib, hx of DVT  OBJECTIVE:   Wt (!) 378 lb 4 oz (171.6 kg)   BMI 55.86 kg/m   Gen: alert, obese, NAD Neck: No JVD appreciated Cardiac: RRR, normal S1/S2 without murmurs, rubs or gallops Lungs: Normal work of breathing on 4L O2, breath sounds equal bilaterally, lungs CTA without wheezes or rales Abd: obese, nontender Ext: compression socks in place bilaterally, no significant peripheral edema   ASSESSMENT/PLAN:   Chronic anticoagulation Patient on Coumadin for several years for hx of DVT as well as atrial fibrillation. INR supratherapeutic at 4.6 today. Upon discussion, patient is amenable to switching to DOAC. -Patient will STOP coumadin, effective immediately -Will initiate Eliquis 55mBID starting Monday, 07/05/2020  T2DM Well-controlled, A1C 7.4% today. -Continue Metformin 10005maily  Medication Refills Patient requesting refills for Gabapentin and Torsemide. I have provided refills today, however patient's cardiologist/pulmonologist should prescribe his Torsemide going forward, as they are the ones managing his heart failure and have recently changed his regimen to include Metolazone.   Encouraged patient to consider [facility], as this is where his cardiologist and pulmonologist  are, and they are managing the majority of his medical problems aside from his diabetes.   Discussed case with Dr. ChaErin HearingAshAlcus DadD ConPierpont

## 2020-07-05 ENCOUNTER — Telehealth: Payer: Self-pay

## 2020-07-05 DIAGNOSIS — I4891 Unspecified atrial fibrillation: Secondary | ICD-10-CM

## 2020-07-05 NOTE — Telephone Encounter (Signed)
[facility] will not be able to deliver patients Eliquis until the end of the week. Patient is requesting a short supply sent to local [facility] in [place]. Please advise.

## 2020-07-07 MED ORDER — APIXABAN 5 MG PO TABS: 5.0000 mg | ORAL_TABLET | Freq: Two times a day (BID) | ORAL | 0 refills | Status: DC

## 2020-07-07 NOTE — Telephone Encounter (Signed)
5 day supply of Eliquis sent to patient's local [facility].

## 2020-07-09 ENCOUNTER — Other Ambulatory Visit: Payer: Self-pay | Admitting: *Deleted

## 2020-07-10 MED ORDER — ACCU-CHEK SOFTCLIX LANCETS MISC: 3 refills | Status: AC

## 2020-07-10 MED ORDER — BD SWAB SINGLE USE REGULAR PADS: MEDICATED_PAD | 3 refills | Status: AC

## 2020-07-12 DIAGNOSIS — R0602 Shortness of breath: Secondary | ICD-10-CM | POA: Diagnosis not present

## 2020-07-15 ENCOUNTER — Other Ambulatory Visit: Payer: Self-pay

## 2020-07-15 DIAGNOSIS — E1142 Type 2 diabetes mellitus with diabetic polyneuropathy: Secondary | ICD-10-CM

## 2020-07-20 ENCOUNTER — Other Ambulatory Visit: Payer: Self-pay

## 2020-07-20 DIAGNOSIS — I5042 Chronic combined systolic (congestive) and diastolic (congestive) heart failure: Secondary | ICD-10-CM

## 2020-07-20 NOTE — Telephone Encounter (Signed)
Pharmacy requesting a 90 day supply. Ottis Stain, CMA

## 2020-07-21 MED ORDER — TORSEMIDE 20 MG PO TABS: 60.0000 mg | ORAL_TABLET | Freq: Every day | ORAL | 0 refills | Status: DC

## 2020-07-23 ENCOUNTER — Other Ambulatory Visit: Payer: Self-pay | Admitting: Family Medicine

## 2020-07-23 DIAGNOSIS — I4891 Unspecified atrial fibrillation: Secondary | ICD-10-CM

## 2020-07-29 NOTE — Telephone Encounter (Signed)
Patient calling nurse line to check the status of refill.

## 2020-07-30 DIAGNOSIS — J9611 Chronic respiratory failure with hypoxia: Secondary | ICD-10-CM | POA: Diagnosis not present

## 2020-07-30 DIAGNOSIS — I509 Heart failure, unspecified: Secondary | ICD-10-CM | POA: Diagnosis not present

## 2020-08-04 ENCOUNTER — Other Ambulatory Visit: Payer: Self-pay

## 2020-08-04 DIAGNOSIS — E1142 Type 2 diabetes mellitus with diabetic polyneuropathy: Secondary | ICD-10-CM

## 2020-08-04 MED ORDER — GABAPENTIN 100 MG PO CAPS: ORAL_CAPSULE | ORAL | 0 refills | Status: DC

## 2020-08-04 NOTE — Telephone Encounter (Signed)
Patient calls nurse line upset his gabapentin has not been refilled. I advised patient medication was sent in on 10/6 for #360 and he should not need anymore. Patient reports he never received this prescription. Looks like it was sent, however "verification failed to pharmacy." I verified with pharmacy. Will forward to PCP to resend since its been almost 1 month.

## 2020-08-06 DIAGNOSIS — R0602 Shortness of breath: Secondary | ICD-10-CM | POA: Diagnosis not present

## 2020-08-25 ENCOUNTER — Other Ambulatory Visit: Payer: Self-pay | Admitting: Family Medicine

## 2020-08-25 DIAGNOSIS — I4891 Unspecified atrial fibrillation: Secondary | ICD-10-CM

## 2020-08-29 DIAGNOSIS — J9611 Chronic respiratory failure with hypoxia: Secondary | ICD-10-CM | POA: Diagnosis not present

## 2020-08-29 DIAGNOSIS — I509 Heart failure, unspecified: Secondary | ICD-10-CM | POA: Diagnosis not present

## 2020-09-01 ENCOUNTER — Other Ambulatory Visit: Payer: Self-pay | Admitting: Family Medicine

## 2020-09-01 DIAGNOSIS — E1142 Type 2 diabetes mellitus with diabetic polyneuropathy: Secondary | ICD-10-CM

## 2020-09-09 ENCOUNTER — Telehealth: Payer: Self-pay

## 2020-09-09 ENCOUNTER — Telehealth: Payer: Self-pay | Admitting: Family Medicine

## 2020-09-09 DIAGNOSIS — E876 Hypokalemia: Secondary | ICD-10-CM

## 2020-09-09 DIAGNOSIS — R600 Localized edema: Secondary | ICD-10-CM | POA: Diagnosis not present

## 2020-09-09 MED ORDER — POTASSIUM CHLORIDE ER 20 MEQ PO TBCR: 40.0000 meq | EXTENDED_RELEASE_TABLET | Freq: Two times a day (BID) | ORAL | 0 refills | Status: DC

## 2020-09-09 NOTE — Telephone Encounter (Signed)
Scott from [facility] calls nurse line to report drug alert for patient. Due to diabetes diagnosis, patient is recommended to receive statin drug therapy.   Please review and prescription can be sent electronically to [facility].    Best contact number [phone]  Please advise  To PCP  Talbot Grumbling, RN

## 2020-09-09 NOTE — Telephone Encounter (Signed)
Called patient to discuss statin therapy and to see if there's a reason he's not currently prescribed one. Patient states he has never been on a statin to his knowledge, but is agreeable to starting one. Patient will call the office and schedule an appointment to discuss further. Will likely initiate statin therapy at that time.

## 2020-09-09 NOTE — Telephone Encounter (Signed)
Called by [facility] to advise of outpatient lab draw today with K of 2.6.    Called patient and advised of low value.  He states that he was previously on potassium and has some at home, but doesn't know how much.  He can't pick up a new Rx until tomorrow.  Advised patient to take up to 40 mEq tonight (pending how much he has), but not more than 40.  Advised 40 mEq tomorrow in AM and tomorrow in PM.  Will also have him have labs checked on Monday, 12/20.  Will place future lab order for [facility], but patient states that he might go to [facility] to have it checked.  Patient understands instructions.  Will forward to PCP.  [place] Constable, D.O.  PGY-3 Family Medicine  09/09/2020 5:20 PM

## 2020-09-10 DIAGNOSIS — Z86718 Personal history of other venous thrombosis and embolism: Secondary | ICD-10-CM | POA: Diagnosis not present

## 2020-09-10 DIAGNOSIS — E119 Type 2 diabetes mellitus without complications: Secondary | ICD-10-CM | POA: Diagnosis not present

## 2020-09-10 DIAGNOSIS — E876 Hypokalemia: Secondary | ICD-10-CM | POA: Diagnosis not present

## 2020-09-10 DIAGNOSIS — I110 Hypertensive heart disease with heart failure: Secondary | ICD-10-CM | POA: Diagnosis not present

## 2020-09-10 DIAGNOSIS — Z0389 Encounter for observation for other suspected diseases and conditions ruled out: Secondary | ICD-10-CM | POA: Diagnosis not present

## 2020-09-10 DIAGNOSIS — M1990 Unspecified osteoarthritis, unspecified site: Secondary | ICD-10-CM | POA: Diagnosis not present

## 2020-09-10 DIAGNOSIS — Z0000 Encounter for general adult medical examination without abnormal findings: Secondary | ICD-10-CM | POA: Diagnosis not present

## 2020-09-10 DIAGNOSIS — J449 Chronic obstructive pulmonary disease, unspecified: Secondary | ICD-10-CM | POA: Diagnosis not present

## 2020-09-10 DIAGNOSIS — I509 Heart failure, unspecified: Secondary | ICD-10-CM | POA: Diagnosis not present

## 2020-09-10 NOTE — Telephone Encounter (Signed)
Patient calls nurse line reporting he took Potassium last night and this morning, 40mq. Patient reports he had this at home and has not picked up the new prescription yet. Patient reports he went back to ED to have labs drawn and is still waiting there. Patient wanted to update PCP. [facility] until he is told he can go.

## 2020-09-15 ENCOUNTER — Other Ambulatory Visit: Payer: Self-pay | Admitting: Family Medicine

## 2020-09-15 DIAGNOSIS — I4891 Unspecified atrial fibrillation: Secondary | ICD-10-CM

## 2020-09-15 DIAGNOSIS — I5042 Chronic combined systolic (congestive) and diastolic (congestive) heart failure: Secondary | ICD-10-CM

## 2020-09-17 DIAGNOSIS — G4733 Obstructive sleep apnea (adult) (pediatric): Secondary | ICD-10-CM | POA: Diagnosis not present

## 2020-09-25 HISTORY — PX: RIGHT HEART CATH: SHX6075

## 2020-09-29 DIAGNOSIS — J9611 Chronic respiratory failure with hypoxia: Secondary | ICD-10-CM | POA: Diagnosis not present

## 2020-09-29 DIAGNOSIS — I509 Heart failure, unspecified: Secondary | ICD-10-CM | POA: Diagnosis not present

## 2020-10-06 DIAGNOSIS — G4733 Obstructive sleep apnea (adult) (pediatric): Secondary | ICD-10-CM | POA: Diagnosis not present

## 2020-10-13 ENCOUNTER — Other Ambulatory Visit: Payer: Self-pay | Admitting: Family Medicine

## 2020-10-13 DIAGNOSIS — I4891 Unspecified atrial fibrillation: Secondary | ICD-10-CM

## 2020-10-30 DIAGNOSIS — I509 Heart failure, unspecified: Secondary | ICD-10-CM | POA: Diagnosis not present

## 2020-10-30 DIAGNOSIS — J9611 Chronic respiratory failure with hypoxia: Secondary | ICD-10-CM | POA: Diagnosis not present

## 2020-11-11 ENCOUNTER — Other Ambulatory Visit: Payer: Self-pay | Admitting: Family Medicine

## 2020-11-11 DIAGNOSIS — I4891 Unspecified atrial fibrillation: Secondary | ICD-10-CM

## 2020-11-22 DIAGNOSIS — R6889 Other general symptoms and signs: Secondary | ICD-10-CM | POA: Diagnosis not present

## 2020-11-22 DIAGNOSIS — M1711 Unilateral primary osteoarthritis, right knee: Secondary | ICD-10-CM | POA: Diagnosis not present

## 2020-11-22 DIAGNOSIS — M1712 Unilateral primary osteoarthritis, left knee: Secondary | ICD-10-CM | POA: Diagnosis not present

## 2020-11-27 DIAGNOSIS — J9611 Chronic respiratory failure with hypoxia: Secondary | ICD-10-CM | POA: Diagnosis not present

## 2020-11-27 DIAGNOSIS — I509 Heart failure, unspecified: Secondary | ICD-10-CM | POA: Diagnosis not present

## 2020-11-29 ENCOUNTER — Other Ambulatory Visit: Payer: Self-pay

## 2020-11-29 DIAGNOSIS — M1712 Unilateral primary osteoarthritis, left knee: Secondary | ICD-10-CM | POA: Diagnosis not present

## 2020-11-29 DIAGNOSIS — R6889 Other general symptoms and signs: Secondary | ICD-10-CM | POA: Diagnosis not present

## 2020-11-29 DIAGNOSIS — E1142 Type 2 diabetes mellitus with diabetic polyneuropathy: Secondary | ICD-10-CM

## 2020-11-29 DIAGNOSIS — M170 Bilateral primary osteoarthritis of knee: Secondary | ICD-10-CM | POA: Diagnosis not present

## 2020-11-29 DIAGNOSIS — M1711 Unilateral primary osteoarthritis, right knee: Secondary | ICD-10-CM | POA: Diagnosis not present

## 2020-11-29 MED ORDER — GABAPENTIN 100 MG PO CAPS: ORAL_CAPSULE | ORAL | 0 refills | Status: DC

## 2020-12-01 ENCOUNTER — Other Ambulatory Visit: Payer: Self-pay | Admitting: Family Medicine

## 2020-12-01 DIAGNOSIS — I5042 Chronic combined systolic (congestive) and diastolic (congestive) heart failure: Secondary | ICD-10-CM

## 2020-12-06 DIAGNOSIS — R6889 Other general symptoms and signs: Secondary | ICD-10-CM | POA: Diagnosis not present

## 2020-12-06 DIAGNOSIS — M1712 Unilateral primary osteoarthritis, left knee: Secondary | ICD-10-CM | POA: Diagnosis not present

## 2020-12-06 DIAGNOSIS — M170 Bilateral primary osteoarthritis of knee: Secondary | ICD-10-CM | POA: Diagnosis not present

## 2020-12-06 DIAGNOSIS — M1711 Unilateral primary osteoarthritis, right knee: Secondary | ICD-10-CM | POA: Diagnosis not present

## 2020-12-09 NOTE — Progress Notes (Signed)
    SUBJECTIVE:   CHIEF COMPLAINT / HPI: nose bleeds concern for blood thinner  Patient currently on eliquis for DVT in femoral vein of LLE as well as hx of AF. He that he has been having nose bleeds for the last 3 months and has been prescribed eliquis 31m twice daily for 4-5 months. He was taking as prescribed up until 1 month ago in an effort to see if this would decrease the nose bleeds. Patient states the bleeding occurs in the form of clots when he blows his nose. He denies feelings of dizziness or lightheadedness. He reports being on Xarelto in the past and experiencing hemoptysis so he was changed to eliquis. He denies hematuria, hematochezia or melena.   Pustules on Fingers  Patient reports that he has had multiple pustules on his fingers for 2-3 weeks. He denies any tenderness associated with the pustules. He denies having the pustules anywhere besides his fingers of both hands. He denies using new soaps, lotions or new chemicals prior to onset of the pustules. He reports some numbness in his fingers intermittently. The pustules do not bleed. He has tried to "pop" them but no fluid came out. They are not associated with any pruritis; patient adds this is a first time occurrence.   PERTINENT  PMH / PSH:  DVT  AF   OBJECTIVE:   BP 122/70   Pulse 78   Wt (!) 355 lb 3.2 oz (161.1 kg)   SpO2 (!) 87%   BMI 52.45 kg/m    General: male appearing stated age in no acute distress, sitting in electrical wheelchair  Cardio: Normal S1 and S2, no S3 or S4. Rhythm is regular Pulm: Clear to auscultation bilaterally, no crackles, wheezing, or diminished breath sounds. Normal respiratory effort, stable on 4L via [place] (patient declines increasing oxygen to 5 liters as he does not want the oxygen to run out before he returns home) Abdomen: Bowel sounds normal. Abdomen soft and non-tender.  Skin:  Media Information         Document Information  Photos    12/10/2020 15:11  Attached To:   Office Visit on 12/10/20 with SEulis Foster MD   Source Information  SEulis Foster MD  [facility] Resident     ASSESSMENT/PLAN:   Epistaxis Patient with hx of bleeding while taking anticoagulant. Patient has subsequently decreased dosing. Discussed importance of taking proper dose given hx of AF and DVT.  Scheduled patient for pharmacy clinic for dosing recommendations  CBC   Skin pustule Given color of pustules may be due to elevated cholesterol levels or elevated TG. Will check lipid panel today. Also considered dyshydrotic eczema but coloration and lack of clear fluid when pustules are ruptured makes this less likely.  - lipid panel      MEulis Foster MD CPitt

## 2020-12-10 ENCOUNTER — Encounter: Payer: Self-pay | Admitting: Family Medicine

## 2020-12-10 ENCOUNTER — Ambulatory Visit (INDEPENDENT_AMBULATORY_CARE_PROVIDER_SITE_OTHER): Payer: Medicare HMO | Admitting: Family Medicine

## 2020-12-10 ENCOUNTER — Other Ambulatory Visit: Payer: Self-pay

## 2020-12-10 VITALS — BP 122/70 | HR 78 | Wt 355.2 lb

## 2020-12-10 DIAGNOSIS — R040 Epistaxis: Secondary | ICD-10-CM | POA: Diagnosis not present

## 2020-12-10 DIAGNOSIS — L089 Local infection of the skin and subcutaneous tissue, unspecified: Secondary | ICD-10-CM | POA: Diagnosis not present

## 2020-12-10 DIAGNOSIS — R6889 Other general symptoms and signs: Secondary | ICD-10-CM | POA: Diagnosis not present

## 2020-12-10 NOTE — Patient Instructions (Signed)
Today we will check your blood work to see how your blood counts are doing.  The areas on your fingers may be due to elevated cholesterol, so we will check your cholesterol levels today as well.  I will also schedule you with our pharmacist to discuss alternative options for blood thinning medication as it is important to prevent strokes or blood clots, however, we want to make sure your not bleeding because of these medications.

## 2020-12-11 LAB — CBC WITH DIFFERENTIAL/PLATELET
Basophils Absolute: 0.1 10*3/uL (ref 0.0–0.2)
Basos: 1 %
EOS (ABSOLUTE): 0.1 10*3/uL (ref 0.0–0.4)
Eos: 1 %
Hematocrit: 34.5 % — ABNORMAL LOW (ref 37.5–51.0)
Hemoglobin: 11.2 g/dL — ABNORMAL LOW (ref 13.0–17.7)
Immature Grans (Abs): 0.1 10*3/uL (ref 0.0–0.1)
Immature Granulocytes: 1 %
Lymphocytes Absolute: 0.5 10*3/uL — ABNORMAL LOW (ref 0.7–3.1)
Lymphs: 5 %
MCH: 28.7 pg (ref 26.6–33.0)
MCHC: 32.5 g/dL (ref 31.5–35.7)
MCV: 89 fL (ref 79–97)
Monocytes Absolute: 1 10*3/uL — ABNORMAL HIGH (ref 0.1–0.9)
Monocytes: 10 %
Neutrophils Absolute: 9.1 10*3/uL — ABNORMAL HIGH (ref 1.4–7.0)
Neutrophils: 82 %
Platelets: 364 10*3/uL (ref 150–450)
RBC: 3.9 x10E6/uL — ABNORMAL LOW (ref 4.14–5.80)
RDW: 13.1 % (ref 11.6–15.4)
WBC: 10.9 10*3/uL — ABNORMAL HIGH (ref 3.4–10.8)

## 2020-12-11 LAB — LIPID PANEL
Chol/HDL Ratio: 5.1 ratio — ABNORMAL HIGH (ref 0.0–5.0)
Cholesterol, Total: 137 mg/dL (ref 100–199)
HDL: 27 mg/dL — ABNORMAL LOW (ref >39)
LDL Chol Calc (NIH): 95 mg/dL (ref 0–99)
Triglycerides: 77 mg/dL (ref 0–149)
VLDL Cholesterol Cal: 15 mg/dL (ref 5–40)

## 2020-12-12 DIAGNOSIS — L089 Local infection of the skin and subcutaneous tissue, unspecified: Secondary | ICD-10-CM | POA: Insufficient documentation

## 2020-12-12 DIAGNOSIS — R040 Epistaxis: Secondary | ICD-10-CM | POA: Insufficient documentation

## 2020-12-12 NOTE — Assessment & Plan Note (Signed)
Given color of pustules may be due to elevated cholesterol levels or elevated TG. Will check lipid panel today. Also considered dyshydrotic eczema but coloration and lack of clear fluid when pustules are ruptured makes this less likely.  - lipid panel

## 2020-12-12 NOTE — Assessment & Plan Note (Signed)
Patient with hx of bleeding while taking anticoagulant. Patient has subsequently decreased dosing. Discussed importance of taking proper dose given hx of AF and DVT.  Scheduled patient for pharmacy clinic for dosing recommendations  CBC

## 2020-12-13 DIAGNOSIS — M1711 Unilateral primary osteoarthritis, right knee: Secondary | ICD-10-CM | POA: Diagnosis not present

## 2020-12-13 DIAGNOSIS — R6889 Other general symptoms and signs: Secondary | ICD-10-CM | POA: Diagnosis not present

## 2020-12-13 DIAGNOSIS — M170 Bilateral primary osteoarthritis of knee: Secondary | ICD-10-CM | POA: Diagnosis not present

## 2020-12-13 DIAGNOSIS — M1712 Unilateral primary osteoarthritis, left knee: Secondary | ICD-10-CM | POA: Diagnosis not present

## 2020-12-15 ENCOUNTER — Ambulatory Visit: Payer: Medicare HMO | Admitting: Pharmacist

## 2020-12-16 DIAGNOSIS — G4733 Obstructive sleep apnea (adult) (pediatric): Secondary | ICD-10-CM | POA: Diagnosis not present

## 2020-12-20 ENCOUNTER — Ambulatory Visit: Payer: Medicare HMO | Admitting: Pharmacist

## 2020-12-20 NOTE — Progress Notes (Deleted)
   Subjective:   Patient ID: Roberto [name]    DOB: [birth year], 60 y.o. male   MRN: [id number]  Roberto [name] is a 60 y.o. male with a history of saphenous vein clot on left, HTN, chronic cor pulmonale, CHF, afib on warfarin, h/o DVT, h/o CAP, h/o chronic respiratory failure w/ hypoxia, GERD, T2DM, OA, morbid obesity, ED, dyspnea, bradycardia, back spasm here for pustules on hand.  Rash on Hand: Patient seen on 3/18 for "pustules on fingers" that patient denied any pain, pruritis, or other symptoms. Unclear etiology at that time. He had lipid panel that was normal. Today he notes ***    Review of Systems:  Per HPI.   Objective:   There were no vitals taken for this visit. Vitals and nursing note reviewed.  General: pleasant ***, sitting comfortably in exam chair, well nourished, well developed, in no acute distress with non-toxic appearance HEENT: normocephalic, atraumatic, moist mucous membranes, oropharynx clear without erythema or exudate, TM normal bilaterally  Neck: supple, non-tender without lymphadenopathy CV: regular rate and rhythm without murmurs, rubs, or gallops, no lower extremity edema, 2+ radial and pedal pulses bilaterally Lungs: clear to auscultation bilaterally with normal work of breathing on room air Resp: breathing comfortably on room air, speaking in full sentences Abdomen: soft, non-tender, non-distended, no masses or organomegaly palpable, normoactive bowel sounds Skin: warm, dry, no rashes or lesions Extremities: warm and well perfused, normal tone MSK: ROM grossly intact, strength intact, gait normal Neuro: Alert and oriented, speech normal  Assessment & Plan:   No problem-specific Assessment & Plan notes found for this encounter.  No orders of the defined types were placed in this encounter.  No orders of the defined types were placed in this encounter.   {    This will disappear when note is signed, click to select method of visit    :1}  [name], DO PGY-3, [facility] 12/20/2020 3:43 PM

## 2020-12-22 ENCOUNTER — Ambulatory Visit: Payer: Medicare HMO | Admitting: Pharmacist

## 2020-12-22 ENCOUNTER — Ambulatory Visit: Payer: Medicare HMO

## 2020-12-27 ENCOUNTER — Telehealth: Payer: Self-pay | Admitting: Pharmacist

## 2020-12-27 NOTE — Telephone Encounter (Cosign Needed)
Patient referred to pharmacy team for question regarding dosing of Eliquis due to frequent nosebleeds reported at last visit with Dr. Alba Cory on 12/10/20.  PMH signficant for DVT and Afib.  Based on diagnosis of Afib patient should continue taking Eliquis 45m BID. I would suggest further workup, preferably by ENT, to identify possible cause of nosebleeds.   Patient has appt with provider on 12/29/20.

## 2020-12-28 DIAGNOSIS — J9611 Chronic respiratory failure with hypoxia: Secondary | ICD-10-CM | POA: Diagnosis not present

## 2020-12-28 DIAGNOSIS — I509 Heart failure, unspecified: Secondary | ICD-10-CM | POA: Diagnosis not present

## 2020-12-29 ENCOUNTER — Ambulatory Visit: Payer: Medicare HMO | Admitting: Pharmacist

## 2020-12-29 ENCOUNTER — Ambulatory Visit (INDEPENDENT_AMBULATORY_CARE_PROVIDER_SITE_OTHER): Payer: Medicare HMO | Admitting: Family Medicine

## 2020-12-29 ENCOUNTER — Other Ambulatory Visit: Payer: Self-pay

## 2020-12-29 VITALS — BP 105/68 | HR 73

## 2020-12-29 DIAGNOSIS — I4891 Unspecified atrial fibrillation: Secondary | ICD-10-CM

## 2020-12-29 DIAGNOSIS — R6889 Other general symptoms and signs: Secondary | ICD-10-CM | POA: Diagnosis not present

## 2020-12-29 DIAGNOSIS — J9611 Chronic respiratory failure with hypoxia: Secondary | ICD-10-CM | POA: Diagnosis not present

## 2020-12-29 DIAGNOSIS — D1720 Benign lipomatous neoplasm of skin and subcutaneous tissue of unspecified limb: Secondary | ICD-10-CM

## 2020-12-29 DIAGNOSIS — L089 Local infection of the skin and subcutaneous tissue, unspecified: Secondary | ICD-10-CM

## 2020-12-29 DIAGNOSIS — R040 Epistaxis: Secondary | ICD-10-CM

## 2020-12-29 NOTE — Patient Instructions (Addendum)
It was a pleasure to see you today!  1. We will contact a specialists and work up the finger lesions more. I will let you know when we have a response.  2. For your fatigue, this is most likely coming from your heart disease. I agree that follow up with cardiology is next best step. Please call the pulmonology office to schedule a sleep study: [phone].  3. The pharmacists recommend you continue taking eliquis. I have placed a referral to ENT regarding nose bleeds. You should expect to receive a call in 1-2 weeks to schedule this appointment.   Be Well,  Dr. Chauncey Reading

## 2020-12-29 NOTE — Progress Notes (Signed)
SUBJECTIVE:   CHIEF COMPLAINT / HPI: f/u finger blisters  Finger pustules: patient seen on 12/20/20 with Dr. Alba Cory regarding finger "blisters". Lipid panel done which showed controlled cholesterol. Patient has a number of pustules on the finger pads of digits 1-4 on each hand. These appeared within the last 3-4 months and have become more numerous. They are not painful or pruritic, but patient notes that his fingertips are more "sensitive." These pustules appear to have pus inside, but when ruptured, do not produce any liquid.   Nodules on forearms: patient has 1-3 nodules on b/l dorsal forearms, each about 1-3 cm, firm, not mobile, not painful. Appear most consistent with lipomas.   Fatigue: patient asks about Vit B12 shots to help with fatigue. He notes that he has felt very tired recently. He has cor pulmonale, HFrEF, recent dx of pulmonary HTN, and uses 5L of O2 at baseline. He was referred by pulmonologist for sleep study, according to referral notes on chart review, he needs insurance approval. Patient has upcoming appt with cardiology.   Epistaxis: patient is on eliquis 5 mg BID for h/o DVT as well as a. Fib. He reports that he was having epistaxis nearly daily when he was on eliquis 10 mg BID, but this has significantly reduced since decreasing to 5 mg. He has epistaxis perhaps 1-2 per month. The episodes are also less severe than previously. He recently saw Dr. Alba Cory and discussed this as well.  A. Fib: patient is on eliquis 5 mg BID, rate controlled today at 73 bpm.   PERTINENT  PMH / PSH: a. Fib, epistaxis, CHF  OBJECTIVE:   BP 105/68   Pulse 73   SpO2 91%   Nursing note and vitals reviewed GEN: age appropriate man, resting comfortably in chair, NAD, obese HEENT: NCAT. PERRLA. Sclera without injection or icterus. MMM.  Neck: Supple.  Cardiac: Regular rate, but irregularly irregular rehythm. Normal S1/S2. No murmurs, rubs, or gallops appreciated. 2+  radial pulses that are similarly irregularly irregular Lungs: Clear bilaterally to ascultation. No increased WOB, no accessory muscle usage. No w/r/r. Neuro: Alert and at baseline Ext: 1+ peripheral edema, compression stockings present Psych: Pleasant and appropriate  ASSESSMENT/PLAN:   Skin pustule Unusual presentation with unclear diagnosis. It is not typically pruritic as would be expected for dishidrotic eczema. Seen with Dr. Erin Hearing. Reassured that patient is not in pain. Will request referral from dermatology via [place] and will follow up when have response.  Lipoma of arm Present b/l. Gave reassurance that these are likely lipomas and can be monitored over time.  Epistaxis Discussed with Dr. Alba Cory. She consulted with pharmacy, they recommend patient continue at low dose eliquis. Will refer to ENT, order placed.  Chronic respiratory failure with hypoxia (HCC) Suspect that this along with comorbidities of CHF and pulmonary HTN, are to blame for fatigue, not vitamin deficiency. Counseled on optimization of risk factors and treatment of comorbidities. Recommend patient contact his pulmonologist and f/u with insurance to get sleep study to better characterize pulmonary htn, OSA vs OSH, etc.   Atrial fibrillation with slow ventricular response ([place]) Patient is likely in a. Fib today due to irregularly irregular rhythm, but rate controlled in 70s and appropriate medical tx. Patient will f/u with cardiology on 4/12.     Gladys Damme, MD [place]

## 2020-12-30 DIAGNOSIS — D1720 Benign lipomatous neoplasm of skin and subcutaneous tissue of unspecified limb: Secondary | ICD-10-CM | POA: Insufficient documentation

## 2020-12-30 NOTE — Assessment & Plan Note (Signed)
Discussed with Dr. Alba Cory. She consulted with pharmacy, they recommend patient continue at low dose eliquis. Will refer to ENT, order placed.

## 2020-12-30 NOTE — Assessment & Plan Note (Signed)
Patient is likely in a. Fib today due to irregularly irregular rhythm, but rate controlled in 70s and appropriate medical tx. Patient will f/u with cardiology on 4/12.

## 2020-12-30 NOTE — Assessment & Plan Note (Signed)
Present b/l. Gave reassurance that these are likely lipomas and can be monitored over time.

## 2020-12-30 NOTE — Assessment & Plan Note (Signed)
Unusual presentation with unclear diagnosis. It is not typically pruritic as would be expected for dishidrotic eczema. Seen with Dr. Erin Hearing. Reassured that patient is not in pain. Will request referral from dermatology via [place] and will follow up when have response.

## 2020-12-30 NOTE — Assessment & Plan Note (Signed)
Suspect that this along with comorbidities of CHF and pulmonary HTN, are to blame for fatigue, not vitamin deficiency. Counseled on optimization of risk factors and treatment of comorbidities. Recommend patient contact his pulmonologist and f/u with insurance to get sleep study to better characterize pulmonary htn, OSA vs OSH, etc.

## 2021-01-24 ENCOUNTER — Ambulatory Visit: Payer: Medicare HMO | Admitting: Family Medicine

## 2021-01-25 DIAGNOSIS — I2781 Cor pulmonale (chronic): Secondary | ICD-10-CM | POA: Diagnosis not present

## 2021-01-25 DIAGNOSIS — I10 Essential (primary) hypertension: Secondary | ICD-10-CM | POA: Diagnosis not present

## 2021-01-25 DIAGNOSIS — G4733 Obstructive sleep apnea (adult) (pediatric): Secondary | ICD-10-CM | POA: Diagnosis not present

## 2021-01-25 DIAGNOSIS — I4891 Unspecified atrial fibrillation: Secondary | ICD-10-CM | POA: Diagnosis not present

## 2021-01-26 ENCOUNTER — Ambulatory Visit: Payer: Medicare HMO | Admitting: Family Medicine

## 2021-01-26 ENCOUNTER — Telehealth: Payer: Self-pay | Admitting: *Deleted

## 2021-01-26 NOTE — Telephone Encounter (Signed)
Pt states that he had an appt with his cardiologist yesterday.  He was advised that since he is not taking 2 Xarelto a day ( pt reports he was told to decrease to one a day at a previous [facility] visit) he needs to go back on coumadin.  Pt has heard about the ability to do coumadin checks at home and is interested in that.   Will forward to PCP for next steps. Christen Bame, CMA

## 2021-01-27 DIAGNOSIS — J9611 Chronic respiratory failure with hypoxia: Secondary | ICD-10-CM | POA: Diagnosis not present

## 2021-01-27 DIAGNOSIS — I509 Heart failure, unspecified: Secondary | ICD-10-CM | POA: Diagnosis not present

## 2021-01-28 ENCOUNTER — Other Ambulatory Visit: Payer: Self-pay | Admitting: Family Medicine

## 2021-01-28 DIAGNOSIS — E1142 Type 2 diabetes mellitus with diabetic polyneuropathy: Secondary | ICD-10-CM

## 2021-01-31 ENCOUNTER — Other Ambulatory Visit: Payer: Self-pay

## 2021-01-31 ENCOUNTER — Ambulatory Visit: Payer: Medicare HMO | Admitting: Family Medicine

## 2021-01-31 VITALS — BP 123/65 | Ht 69.0 in | Wt 352.0 lb

## 2021-01-31 DIAGNOSIS — R6889 Other general symptoms and signs: Secondary | ICD-10-CM | POA: Diagnosis not present

## 2021-01-31 DIAGNOSIS — M170 Bilateral primary osteoarthritis of knee: Secondary | ICD-10-CM | POA: Diagnosis not present

## 2021-01-31 MED ORDER — METHYLPREDNISOLONE ACETATE 40 MG/ML IJ SUSP
40.0000 mg | Freq: Once | INTRAMUSCULAR | Status: AC
  Administered 2021-01-31: 40 mg via INTRA_ARTICULAR

## 2021-01-31 NOTE — Progress Notes (Signed)
PCP: Alcus Dad, MD  Subjective:   HPI: Patient is a 60 y.o. male here for bilateral knee arthritis.  History of bilateral knee injections 1 year prior for knee arthritis. Had some relief with knee injections in the past.  Endorsing pain in posteromedial left knee and medial right knee. Pain mostly with weightbearing.  Patient utilizes motorized scooter mostly but also will weight-bear with a cane. Would like knee injections today.  Past Medical History:  Diagnosis Date  . Asthma   . CHF (congestive heart failure) ([place])   . COPD (chronic obstructive pulmonary disease) ([place])   . Diabetes mellitus without complication ([place])   . Hypertension     Current Outpatient Medications on File Prior to Visit  Medication Sig Dispense Refill  . ACCU-CHEK AVIVA PLUS test strip TEST BLOOD SUGAR TWICE DAILY 200 strip 1  . Accu-Chek Softclix Lancets lancets USE TO TEST TWO TIMES DAILY 200 each 3  . acetaminophen (TYLENOL) 325 MG tablet Take 2 tablets (650 mg total) by mouth every 6 (six) hours as needed. 60 tablet 0  . Alcohol Swabs (B-D SINGLE USE SWABS REGULAR) PADS USE TWO TIMES DAILY 200 each 3  . Blood Glucose Monitoring Suppl (ACCU-CHEK AVIVA PLUS) w/Device KIT TEST TWO TIMES DAILY 1 kit 0  . cholecalciferol (VITAMIN D) 1000 units tablet Take 1 tablet (1,000 Units total) by mouth daily. 90 tablet 2  . cyclobenzaprine (FLEXERIL) 10 MG tablet Take 1 tablet (10 mg total) by mouth 3 (three) times daily as needed for muscle spasms. (Patient not taking: Reported on 12/10/2020) 30 tablet 0  . Elastic Bandages & Supports (WRIST SUPPORT/ELASTIC/FIRM LG) MISC     . ELIQUIS 5 MG TABS tablet TAKE 1 TABLET TWICE DAILY 60 tablet 0  . ferrous sulfate 325 (65 FE) MG tablet Take 325 mg by mouth daily. (Patient not taking: No sig reported)    . gabapentin (NEURONTIN) 100 MG capsule Take 2 capsules (27m) twice daily 360 capsule 0  . metFORMIN (GLUCOPHAGE) 1000 MG tablet Take 1 tablet (1,000 mg total) by mouth  daily. 90 tablet 3  . metolazone (ZAROXOLYN) 2.5 MG tablet Take 2.5 mg by mouth daily.    . OXYGEN Inhale 5 L into the lungs continuous.    . potassium chloride 20 MEQ TBCR Take 40 mEq by mouth 2 (two) times daily for 2 doses. 4 tablet 0  . PRESCRIPTION MEDICATION Inhale into the lungs as needed (whenever sleeping - naps or at night). CPAP    . sildenafil (VIAGRA) 25 MG tablet Take 1 tablet (25 mg total) by mouth daily as needed for erectile dysfunction. (Patient not taking: No sig reported) 10 tablet 0  . torsemide (DEMADEX) 20 MG tablet TAKE 3 TABLETS EVERY DAY 270 tablet 0  . valsartan (DIOVAN) 40 MG tablet Take 40 mg by mouth daily. (Patient not taking: Reported on 12/10/2020)     No current facility-administered medications on file prior to visit.    Past Surgical History:  Procedure Laterality Date  . SHOULDER SURGERY      No Known Allergies  Social History   Socioeconomic History  . Marital status: Single    Spouse name: Not on file  . Number of children: 2  . Years of education: 12th grade  . Highest education level: High school graduate  Occupational History  . 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 Smoking status: Never Smoker  . Smokeless tobacco: Never Used  Vaping Use  .  Vaping Use: Never used  Substance and Sexual Activity  . Alcohol use: No  . Drug use: No  . Sexual activity: Not Currently    Birth control/protection: Condom  Other Topics Concern  . Not on file  Social History Narrative   Lives in senior apartments, lives alone. Ground floor. Has social activities daily. Uses treadmill. No steps, grab rails in bathroom, smoke alarm, wears seat belt. No firearms.    Social Determinants of Health   Financial Resource Strain: Not on file  Food Insecurity: Not on file  Transportation Needs: Not on file  Physical Activity: Not on file  Stress: Not on file  Social Connections: Not on file  Intimate Partner Violence:  Not on file    Family History  Problem Relation Age of Onset  . Diabetes Mother   . Cancer Mother   . Diabetes Father   . High blood pressure Father   . Diabetes Brother   . Diabetes Brother     BP 123/65   Ht 5' 9" (1.753 m)   Wt (!) 352 lb (159.7 kg)   BMI 51.98 kg/m   No flowsheet data found.  No flowsheet data found.  Review of Systems: See HPI above.     Objective:  Physical Exam:  Gen: NAD, comfortable in exam room  Bilateral Knees: Overall exam difficult due to body habitus. No gross deformity or ecchymoses.  Global swelling bilaterally. TTP medial joint lines bilaterally. Decreased ROM; loss of 5 degrees of extension. Negative ant/post drawers. Negative valgus/varus testing. Negative mcmurrays, apleys.  NV intact distally.  Assessment & Plan:  1. Osteoarthritis of both knees - known severe arthropathy.  S/p bilateral knee injections 03/10/2020.  Will proceed with bilateral injections again today.   Procedure Notes: After informed written consent timeout was performed, patient was seated in chair in exam room. Right knee was prepped with alcohol swab and utilizing anteromedial approach, patient's right knee was injected intraarticularly with 3:1 lidocaine: depomedrol. Patient tolerated the procedure well without immediate complications.  After informed written consent timeout was performed, patient was seated in chair in exam room. Left knee was prepped with alcohol swab and utilizing anteromedial approach, patient's left knee was injected intraarticularly with 3:1 lidocaine: depomedrol. Patient tolerated the procedure well without immediate complications.

## 2021-01-31 NOTE — Patient Instructions (Signed)
Your pain is due to arthritis. These are the different medications you can take for this: Tylenol 552m 1-2 tabs three times a day for pain. Capsaicin, aspercreme, or biofreeze topically up to four times a day may also help with pain. Some supplements that may help for arthritis: Boswellia extract, curcumin, pycnogenol Cortisone injections are an option - you were given these today. It's important that you continue to stay active. Straight leg raises, knee extensions 3 sets of 10 once a day (add ankle weight if these become too easy). Consider physical therapy to strengthen muscles around the joint that hurts to take pressure off of the joint itself. Shoe inserts with good arch support may be helpful. Heat or ice 15 minutes at a time 3-4 times a day as needed to help with pain. Follow up with me as needed.

## 2021-02-01 ENCOUNTER — Encounter: Payer: Self-pay | Admitting: Family Medicine

## 2021-02-01 NOTE — Telephone Encounter (Signed)
Patient calls nurse line again in regards to previous message. Patients cardiologist stated if he can not tolerate (2) eliquis a day he needs to switch to coumadin asap. Patient is reaching out to agencies to see who can manage his coumadin at home. Patient advised to call us so we can place the referral if needed. Patient stated in the meantime what should he do about his eliquis verses coumadin. Attempted to schedule with PCP, however he declined.

## 2021-02-02 ENCOUNTER — Other Ambulatory Visit: Payer: Self-pay

## 2021-02-02 ENCOUNTER — Encounter (HOSPITAL_COMMUNITY): Payer: Self-pay | Admitting: *Deleted

## 2021-02-02 ENCOUNTER — Inpatient Hospital Stay (HOSPITAL_COMMUNITY)
Admission: EM | Admit: 2021-02-02 | Discharge: 2021-02-06 | DRG: 291 | Disposition: A | Discharge status: Home Health | Payer: Medicare HMO | Attending: Internal Medicine | Admitting: Internal Medicine

## 2021-02-02 ENCOUNTER — Emergency Department (HOSPITAL_COMMUNITY): Payer: Medicare HMO

## 2021-02-02 DIAGNOSIS — Z9981 Dependence on supplemental oxygen: Secondary | ICD-10-CM

## 2021-02-02 DIAGNOSIS — N1832 Chronic kidney disease, stage 3b: Secondary | ICD-10-CM | POA: Diagnosis present

## 2021-02-02 DIAGNOSIS — Z20822 Contact with and (suspected) exposure to covid-19: Secondary | ICD-10-CM | POA: Diagnosis present

## 2021-02-02 DIAGNOSIS — I5042 Chronic combined systolic (congestive) and diastolic (congestive) heart failure: Secondary | ICD-10-CM

## 2021-02-02 DIAGNOSIS — E1142 Type 2 diabetes mellitus with diabetic polyneuropathy: Secondary | ICD-10-CM

## 2021-02-02 DIAGNOSIS — J449 Chronic obstructive pulmonary disease, unspecified: Secondary | ICD-10-CM | POA: Diagnosis present

## 2021-02-02 DIAGNOSIS — Z6841 Body Mass Index (BMI) 40.0 and over, adult: Secondary | ICD-10-CM

## 2021-02-02 DIAGNOSIS — I4891 Unspecified atrial fibrillation: Secondary | ICD-10-CM | POA: Diagnosis not present

## 2021-02-02 DIAGNOSIS — Z7901 Long term (current) use of anticoagulants: Secondary | ICD-10-CM

## 2021-02-02 DIAGNOSIS — Z79899 Other long term (current) drug therapy: Secondary | ICD-10-CM

## 2021-02-02 DIAGNOSIS — M1711 Unilateral primary osteoarthritis, right knee: Secondary | ICD-10-CM | POA: Diagnosis present

## 2021-02-02 DIAGNOSIS — J9611 Chronic respiratory failure with hypoxia: Secondary | ICD-10-CM | POA: Diagnosis not present

## 2021-02-02 DIAGNOSIS — Z8249 Family history of ischemic heart disease and other diseases of the circulatory system: Secondary | ICD-10-CM | POA: Diagnosis not present

## 2021-02-02 DIAGNOSIS — I509 Heart failure, unspecified: Secondary | ICD-10-CM | POA: Diagnosis not present

## 2021-02-02 DIAGNOSIS — I4821 Permanent atrial fibrillation: Secondary | ICD-10-CM | POA: Diagnosis not present

## 2021-02-02 DIAGNOSIS — Z9114 Patient's other noncompliance with medication regimen: Secondary | ICD-10-CM

## 2021-02-02 DIAGNOSIS — I130 Hypertensive heart and chronic kidney disease with heart failure and stage 1 through stage 4 chronic kidney disease, or unspecified chronic kidney disease: Principal | ICD-10-CM | POA: Diagnosis present

## 2021-02-02 DIAGNOSIS — I82412 Acute embolism and thrombosis of left femoral vein: Secondary | ICD-10-CM | POA: Diagnosis present

## 2021-02-02 DIAGNOSIS — E662 Morbid (severe) obesity with alveolar hypoventilation: Secondary | ICD-10-CM | POA: Diagnosis not present

## 2021-02-02 DIAGNOSIS — R069 Unspecified abnormalities of breathing: Secondary | ICD-10-CM | POA: Diagnosis not present

## 2021-02-02 DIAGNOSIS — E1140 Type 2 diabetes mellitus with diabetic neuropathy, unspecified: Secondary | ICD-10-CM | POA: Diagnosis present

## 2021-02-02 DIAGNOSIS — E1122 Type 2 diabetes mellitus with diabetic chronic kidney disease: Secondary | ICD-10-CM | POA: Diagnosis present

## 2021-02-02 DIAGNOSIS — R0602 Shortness of breath: Secondary | ICD-10-CM | POA: Diagnosis not present

## 2021-02-02 DIAGNOSIS — I517 Cardiomegaly: Secondary | ICD-10-CM | POA: Diagnosis not present

## 2021-02-02 DIAGNOSIS — I110 Hypertensive heart disease with heart failure: Secondary | ICD-10-CM | POA: Diagnosis not present

## 2021-02-02 DIAGNOSIS — J9610 Chronic respiratory failure, unspecified whether with hypoxia or hypercapnia: Secondary | ICD-10-CM | POA: Diagnosis not present

## 2021-02-02 DIAGNOSIS — Z9111 Patient's noncompliance with dietary regimen: Secondary | ICD-10-CM | POA: Diagnosis not present

## 2021-02-02 DIAGNOSIS — I2781 Cor pulmonale (chronic): Secondary | ICD-10-CM | POA: Diagnosis present

## 2021-02-02 DIAGNOSIS — I2729 Other secondary pulmonary hypertension: Secondary | ICD-10-CM | POA: Diagnosis not present

## 2021-02-02 DIAGNOSIS — Z833 Family history of diabetes mellitus: Secondary | ICD-10-CM

## 2021-02-02 DIAGNOSIS — I5043 Acute on chronic combined systolic (congestive) and diastolic (congestive) heart failure: Secondary | ICD-10-CM | POA: Diagnosis not present

## 2021-02-02 DIAGNOSIS — Z7984 Long term (current) use of oral hypoglycemic drugs: Secondary | ICD-10-CM | POA: Diagnosis not present

## 2021-02-02 DIAGNOSIS — K219 Gastro-esophageal reflux disease without esophagitis: Secondary | ICD-10-CM | POA: Diagnosis present

## 2021-02-02 DIAGNOSIS — R0902 Hypoxemia: Secondary | ICD-10-CM | POA: Diagnosis not present

## 2021-02-02 HISTORY — DX: Pulmonary hypertension, unspecified: I27.20

## 2021-02-02 HISTORY — DX: Unspecified atrial fibrillation: I48.91

## 2021-02-02 LAB — CBC WITH DIFFERENTIAL/PLATELET
Abs Immature Granulocytes: 0.09 10*3/uL — ABNORMAL HIGH (ref 0.00–0.07)
Basophils Absolute: 0.1 10*3/uL (ref 0.0–0.1)
Basophils Relative: 1 %
Eosinophils Absolute: 0.1 10*3/uL (ref 0.0–0.5)
Eosinophils Relative: 1 %
HCT: 38.3 % — ABNORMAL LOW (ref 39.0–52.0)
Hemoglobin: 11.4 g/dL — ABNORMAL LOW (ref 13.0–17.0)
Immature Granulocytes: 1 %
Lymphocytes Relative: 7 %
Lymphs Abs: 0.7 10*3/uL (ref 0.7–4.0)
MCH: 28.2 pg (ref 26.0–34.0)
MCHC: 29.8 g/dL — ABNORMAL LOW (ref 30.0–36.0)
MCV: 94.8 fL (ref 80.0–100.0)
Monocytes Absolute: 0.9 10*3/uL (ref 0.1–1.0)
Monocytes Relative: 10 %
Neutro Abs: 7.7 10*3/uL (ref 1.7–7.7)
Neutrophils Relative %: 80 %
Platelets: 315 10*3/uL (ref 150–400)
RBC: 4.04 MIL/uL — ABNORMAL LOW (ref 4.22–5.81)
RDW: 17.8 % — ABNORMAL HIGH (ref 11.5–15.5)
WBC: 9.6 10*3/uL (ref 4.0–10.5)
nRBC: 0 % (ref 0.0–0.2)

## 2021-02-02 LAB — BASIC METABOLIC PANEL
Anion gap: 15 (ref 5–15)
BUN: 95 mg/dL — ABNORMAL HIGH (ref 6–20)
CO2: 27 mmol/L (ref 22–32)
Calcium: 8.9 mg/dL (ref 8.9–10.3)
Chloride: 93 mmol/L — ABNORMAL LOW (ref 98–111)
Creatinine, Ser: 2.21 mg/dL — ABNORMAL HIGH (ref 0.61–1.24)
GFR, Estimated: 33 mL/min — ABNORMAL LOW (ref >60)
Glucose, Bld: 228 mg/dL — ABNORMAL HIGH (ref 70–99)
Potassium: 3.6 mmol/L (ref 3.5–5.1)
Sodium: 135 mmol/L (ref 135–145)

## 2021-02-02 LAB — BRAIN NATRIURETIC PEPTIDE: B Natriuretic Peptide: 458 pg/mL — ABNORMAL HIGH (ref 0.0–100.0)

## 2021-02-02 LAB — TROPONIN I (HIGH SENSITIVITY)
Troponin I (High Sensitivity): 37 ng/L — ABNORMAL HIGH (ref <18)
Troponin I (High Sensitivity): 38 ng/L — ABNORMAL HIGH (ref <18)

## 2021-02-02 LAB — CBG MONITORING, ED: Glucose-Capillary: 144 mg/dL — ABNORMAL HIGH (ref 70–99)

## 2021-02-02 LAB — MAGNESIUM: Magnesium: 1.8 mg/dL (ref 1.7–2.4)

## 2021-02-02 MED ORDER — FUROSEMIDE 10 MG/ML IJ SOLN
60.0000 mg | Freq: Two times a day (BID) | INTRAMUSCULAR | Status: DC
  Administered 2021-02-03 – 2021-02-06 (×7): 60 mg via INTRAVENOUS
  Filled 2021-02-02 (×7): qty 6

## 2021-02-02 MED ORDER — INSULIN ASPART 100 UNIT/ML IJ SOLN
0.0000 [IU] | Freq: Three times a day (TID) | INTRAMUSCULAR | Status: DC
  Administered 2021-02-03: 5 [IU] via SUBCUTANEOUS
  Administered 2021-02-03: 3 [IU] via SUBCUTANEOUS
  Administered 2021-02-03: 5 [IU] via SUBCUTANEOUS
  Administered 2021-02-04: 2 [IU] via SUBCUTANEOUS
  Administered 2021-02-04 – 2021-02-05 (×2): 3 [IU] via SUBCUTANEOUS
  Administered 2021-02-05: 2 [IU] via SUBCUTANEOUS
  Administered 2021-02-05 – 2021-02-06 (×2): 3 [IU] via SUBCUTANEOUS
  Administered 2021-02-06: 2 [IU] via SUBCUTANEOUS

## 2021-02-02 MED ORDER — FUROSEMIDE 10 MG/ML IJ SOLN: 80.0000 mg | Freq: Once | INTRAMUSCULAR | Status: DC

## 2021-02-02 MED ORDER — FUROSEMIDE 10 MG/ML IJ SOLN
80.0000 mg | Freq: Once | INTRAMUSCULAR | Status: AC
  Administered 2021-02-02: 80 mg via INTRAMUSCULAR
  Filled 2021-02-02: qty 8

## 2021-02-02 MED ORDER — ACETAMINOPHEN 500 MG PO TABS
1000.0000 mg | ORAL_TABLET | Freq: Four times a day (QID) | ORAL | Status: DC | PRN
  Administered 2021-02-02 – 2021-02-06 (×8): 1000 mg via ORAL
  Filled 2021-02-02 (×8): qty 2

## 2021-02-02 MED ORDER — INSULIN ASPART 100 UNIT/ML IJ SOLN
0.0000 [IU] | Freq: Every day | INTRAMUSCULAR | Status: DC
  Administered 2021-02-03: 2 [IU] via SUBCUTANEOUS

## 2021-02-02 NOTE — H&P (Signed)
TRH H&P   Patient Demographics:    Roberto [name], is a 60 y.o. male  MRN: [id number]   DOB - June 14, 1961  Admit Date - 02/02/2021  Outpatient Primary MD for the patient is Alcus Dad, MD  Referring MD/NP/PA: PA IDol  Outpatient Specialists:  Followed at [facility] for Pulm HTN and CHF  Patient coming from: Home  Chief Complaint  Patient presents with  . Shortness of Breath      HPI:    Roberto [name]  is a 60 y.o. male, with  past medical history of essential hypertension, morbid obesity and severe obstructive sleep apnea, severe pulmonary hypertension, chronic right heart failure/cor pulmonale, permanent atrial fibrillation chronic diastolic heart failure as well as diabetes mellitus, chronic respiratory failure on 5 L nasal cannula secondary to pulmonary hypertension. -Patient presents to ED secondary to worsening dyspnea, patient reports worsening dyspnea for last few days, mainly orthopnea, reports he has been sitting up for last few days, he reports 19 pound weight gain (ideal weight 251, reports he was 272 recently at his PCP office,), he is on 5 L nasal cannula currently 100% on 6 L nasal cannula, and reports he has not been compliant with salt restriction for last couple weeks, but he has been otherwise been compliant with his medications, as well report he has been intermittently noncompliant with his Eliquis, but he has been managed with diuresis otherwise. -In ED he was noted to have significant lower extremity edema, chest x-ray with vascular congestion, fattening 2.2, BNP of 458, [facility] hospitalist consulted to admit.    Review of systems:    In addition to the HPI above,  No Fever-chills, No Headache, No changes with Vision or hearing, No problems swallowing food or Liquids, No Chest pain, Cough, he does report dyspnea and worsening lower extremity edema No Abdominal  pain, No Nausea or Vommitting, Bowel movements are regular, No Blood in stool or Urine, No dysuria, No new skin rashes or bruises, No new joints pains-aches,  No new weakness, tingling, numbness in any extremity, No recent weight gain or loss, No polyuria, polydypsia or polyphagia, No significant Mental Stressors.  A full 10 point Review of Systems was done, except as stated above, all other Review of Systems were negative.   With Past History of the following :    Past Medical History:  Diagnosis Date  . Asthma   . Atrial fibrillation ([place])   . CHF (congestive heart failure) ([place])   . COPD (chronic obstructive pulmonary disease) ([place])   . Diabetes mellitus without complication ([place])   . Hypertension   . Pulmonary hypertension ([place])       Past Surgical History:  Procedure Laterality Date  . SHOULDER SURGERY        Social History:     Social History   Tobacco Use  . Smoking status: Never Smoker  . Smokeless tobacco: Never  Used  Substance Use Topics  . Alcohol use: No       Family History :     Family History  Problem Relation Age of Onset  . Diabetes Mother   . Cancer Mother   . Diabetes Father   . High blood pressure Father   . Diabetes Brother   . Diabetes Brother       Home Medications:   Prior to Admission medications   Medication Sig Start Date End Date Taking? Authorizing Provider  acetaminophen (TYLENOL) 325 MG tablet Take 2 tablets (650 mg total) by mouth every 6 (six) hours as needed. 03/08/18  Yes Carlyle Dolly, MD  acetaminophen (TYLENOL) 650 MG CR tablet Take 1,300 mg by mouth every 8 (eight) hours as needed for pain.   Yes [provider]  Alcohol Swabs (B-D SINGLE USE SWABS REGULAR) PADS USE TWO TIMES DAILY 07/10/20  Yes Alcus Dad, MD  cholecalciferol (VITAMIN D) 1000 units tablet Take 1 tablet (1,000 Units total) by mouth daily. 03/08/18  Yes Gambino, Arlie Solomons, MD  ELIQUIS 5 MG TABS tablet TAKE 1 TABLET TWICE  DAILY Patient taking differently: Take 5 mg by mouth daily. 11/15/20  Yes Alcus Dad, MD  gabapentin (NEURONTIN) 100 MG capsule Take 2 capsules (241m) twice daily 11/29/20  Yes WAlcus Dad MD  ibuprofen (ADVIL) 800 MG tablet Take 800 mg by mouth every 8 (eight) hours as needed.   Yes [provider]  metFORMIN (GLUCOPHAGE) 1000 MG tablet Take 1 tablet (1,000 mg total) by mouth daily. 03/09/20  Yes BSherene Sires DO  Multiple Vitamin (MULTIVITAMIN) tablet Take 1 tablet by mouth daily.   Yes [provider]  OXYGEN Inhale 5 L into the lungs continuous.   Yes [provider]  oxymetazoline (AFRIN) 0.05 % nasal spray 1 spray See admin instructions. 2 sprays into one nostril as needed for nasal congestion   Yes [provider]  Potassium Citrate 99 MG CAPS Take 1 capsule by mouth daily.   Yes [provider]  PRESCRIPTION MEDICATION Inhale into the lungs as needed (whenever sleeping - naps or at night). CPAP   Yes [provider]  torsemide (DEMADEX) 20 MG tablet TAKE 3 TABLETS EVERY DAY Patient taking differently: Take 60 mg by mouth every evening. 12/10/20  Yes Simmons-[name], [place], MD  ACCU-CHEK AVIVA PLUS test strip TEST BLOOD SUGAR TWICE DAILY 02/01/21   WAlcus Dad MD  Accu-Chek Softclix Lancets lancets USE TO TEST TWO TIMES DAILY 07/10/20   WAlcus Dad MD  Blood Glucose Monitoring Suppl (ACCU-CHEK AVIVA PLUS) w/Device KIT TEST TWO TIMES DAILY 08/06/18   BSherene Sires DO  cyclobenzaprine (FLEXERIL) 10 MG tablet Take 1 tablet (10 mg total) by mouth 3 (three) times daily as needed for muscle spasms. Patient not taking: No sig reported 12/30/19   BSherene Sires DO  Elastic Bandages & Supports (WRIST SUPPORT/ELASTIC/FIRM LG) MISC  05/28/18   [provider]  ferrous sulfate 325 (65 FE) MG tablet Take 325 mg by mouth daily. Patient not taking: No sig reported    [provider]  KLOR-CON M20 20 MEQ tablet Take 40  mEq by mouth 2 (two) times daily. Patient not taking: No sig reported 09/09/20   [provider]  metolazone (ZAROXOLYN) 2.5 MG tablet Take 2.5 mg by mouth daily. Patient not taking: No sig reported    [provider]  potassium chloride 20 MEQ TBCR Take 40 mEq by mouth 2 (two) times daily for 2 doses.  Patient not taking: Reported on 02/02/2021 09/09/20 09/10/20  Meccariello, Bernita Raisin, DO  sildenafil (VIAGRA) 25 MG tablet Take 1 tablet (25 mg total) by mouth daily as needed for erectile dysfunction. Patient not taking: No sig reported 12/30/19   Sherene Sires, DO  valsartan (DIOVAN) 40 MG tablet Take 40 mg by mouth daily. Patient not taking: No sig reported    [provider]     Allergies:    No Known Allergies   Physical Exam:   Vitals  Blood pressure 113/68, pulse 67, temperature 98.4 F (36.9 C), temperature source Oral, resp. rate 18, height _0  (1.753 m), weight (!) 168.3 kg, SpO2 99 %.   1. General Roberto [name] obese male, lying in bed, in mild respiratory discomfort  2. Normal affect and insight, Not Suicidal or Homicidal, Awake Alert, Oriented X 3.  3. No F.N deficits, ALL C.Nerves Intact, Strength 5/5 all 4 extremities, Sensation intact all 4 extremities, Plantars down going.  4. Ears and Eyes appear Normal, Conjunctivae clear, PERRLA. Moist Oral Mucosa.  5. Supple Neck, No JVD, No cervical lymphadenopathy appriciated, No Carotid Bruits.  6. Symmetrical Chest wall movement, Minister air entry at the bases with scattered crackles  7. RRR, No Gallops, Rubs or Murmurs, No Parasternal Heave.  +3 edema to above knee area  8. Positive Bowel Sounds, Abdomen Soft, No tenderness, No organomegaly appriciated,No rebound -guarding or rigidity.  9.  No Cyanosis, Normal Skin Turgor, No Skin Rash or Bruise.  10. Good muscle tone,  joints appear normal , no effusions, Normal ROM.  11. No Palpable Lymph Nodes in Neck or Axillae     Data Review:     CBC Recent Labs  Lab 02/02/21 1730  WBC 9.6  HGB 11.4*  HCT 38.3*  PLT 315  MCV 94.8  MCH 28.2  MCHC 29.8*  RDW 17.8*  LYMPHSABS 0.7  MONOABS 0.9  EOSABS 0.1  BASOSABS 0.1   ------------------------------------------------------------------------------------------------------------------  Chemistries  Recent Labs  Lab 02/02/21 1730  NA 135  K 3.6  CL 93*  CO2 27  GLUCOSE 228*  BUN 95*  CREATININE 2.21*  CALCIUM 8.9  MG 1.8   ------------------------------------------------------------------------------------------------------------------ estimated creatinine clearance is 55.2 mL/min (A) (by C-G formula based on SCr of 2.21 mg/dL (H)). ------------------------------------------------------------------------------------------------------------------ No results for input(s): TSH, T4TOTAL, T3FREE, THYROIDAB in the last 72 hours.  Invalid input(s): FREET3  Coagulation profile No results for input(s): INR, PROTIME in the last 168 hours. ------------------------------------------------------------------------------------------------------------------- No results for input(s): DDIMER in the last 72 hours. -------------------------------------------------------------------------------------------------------------------  Cardiac Enzymes No results for input(s): CKMB, TROPONINI, MYOGLOBIN in the last 168 hours.  Invalid input(s): CK ------------------------------------------------------------------------------------------------------------------    Component Value Date/Time   BNP 458.0 (H) 02/02/2021 1730     ---------------------------------------------------------------------------------------------------------------  Urinalysis No results found for: COLORURINE, APPEARANCEUR, LABSPEC, PHURINE, GLUCOSEU, HGBUR, BILIRUBINUR, KETONESUR, PROTEINUR, UROBILINOGEN, NITRITE,  LEUKOCYTESUR  ----------------------------------------------------------------------------------------------------------------   Imaging Results:    DG Chest Portable 1 View  Result Date: 02/02/2021 CLINICAL DATA:  Short of breath EXAM: PORTABLE CHEST 1 VIEW COMPARISON:  05/09/2020 FINDINGS: Cardiomegaly with vascular congestion. No pleural effusion, consolidation or pneumothorax IMPRESSION: Cardiomegaly with vascular congestion Electronically Signed   By: Donavan Foil M.D.   On: 02/02/2021 18:20    My personal review of EKG undetermined rhythm, Rate  78 /min, QTc448   Assessment & Plan:    Active Problems:   Atrial fibrillation with slow ventricular response (HCC)   Chronic respiratory failure with hypoxia (HCC)   Cor pulmonale, chronic (  [place])   Acute deep vein thrombosis (DVT) of femoral vein of left lower extremity (HCC)   GERD without esophagitis   CHF, acute on chronic (HCC)    Acute Chronic systolic and diastolic congestive heart failure/right heart failure -Patient with history of chronic systolic/diastolic CHF documented at [facility]. -Most recent echo at [facility] on 02/2020 with EF 65 to 70%, with normal wall thickness, left atrium mildly to moderately dilated, right ventricle moderately to severely dilated, with severe pulmonary hypertension systolic pressure is 98 limited mercury. -He is with evidence of volume overload, 19 pounds weight gain in the last month, 251, most recent 271) -We will continue with IV Lasix, hold torsemide, will give metolazone as needed. -Used to be on Aldactone, discontinued due to hyperkalemia but his cardiologist office. -In his complicated cardiac history will consult cardiology to assist with diuresis. -We will keep low-salt diet, daily weight, strict ins and out. -Plan for right heart cath next month at [facility].  Cor pulmonale, chronic / severe pulmonary hypertension - As above -He is following at [facility] for right heart cath by their  documentation  Atrial fibrillation with CVR  -History at [facility] he is not requiring any rate control medications . - high CHADS2VASC score - No palpitations - Not requiring rate control medications -Continue with Eliquis.  Essential hypertension -continue with home meds  Obstructive sleep apnea of adult -continue with BiPAP and chronic oxygen therapy  Chronic respiratory failure with hypoxia  - Continue oxygen supplement, he is at baseline, currently on 5 L nasal cannula  Type 2 diabetes mellitus  -hold metformin, continue with insulin sliding scale, will check A1c  Morbid obesity - He is unable to exercise or walk much due to chronic severe right knee pain/DJD  Chronic kidney disease stage IIIb -Creatinine at baseline, most recent was 1.89 earlier in the month at [facility], it is 2.2 today, monitor closely as on IV diuresis    DVT Prophylaxis on Eliquis  AM Labs Ordered, also please review Full Orders  Family Communication: Admission, patients condition and plan of care including tests being ordered have been discussed with the patient  who indicate understanding and agree with the plan and Code Status.  Code Status Full  Likely DC to  home  Condition GUARDED    Consults called: cardiology requested ib EPIC    Admission status: inpatient    Time spent in minutes : 60 minutes   Phillips Climes M.D on 02/02/2021 at 8:18 PM   [facility] - Office  [phone]

## 2021-02-02 NOTE — Progress Notes (Signed)
TR[facility] night shift.  The patient is requesting Tylenol for mild pain/headache.  He is 168.3 kg with a BMI of 54.79 kg/m.  Acetaminophen 1000 mg every 6 hours as needed ordered.  Tennis Must, MD.

## 2021-02-02 NOTE — ED Notes (Signed)
Frozen meal heated and provided for pt. With diet lemon lime soda

## 2021-02-02 NOTE — ED Provider Notes (Signed)
  Face-to-face evaluation   History: He presents for evaluation of shortness of breath, with weight gain of about 20 pounds.  He states that he takes his medicines as prescribed but sometimes eats foods which contain salt which he knows he is not supposed to.  He denies chest pain.  He has a history of an abnormal EKG but never had a known diagnosed heart attack.  He states he has heart failure and saw his cardiologist weeks ago for checkup.  He reports that they plan on doing a right sided heart cath, to check on his pulmonary hypertension, in about a month.  He denies fever, chills, cough, nausea or vomiting.  Physical exam: Morbidly obese, alert and cooperative.  No dysarthria or aphasia.  No respiratory distress.  He is currently on 6 L nasal cannula oxygen.  Extremities are remarkable for moderate edema bilaterally   Medical screening examination/treatment/procedure(s) were conducted as a shared visit with non-physician practitioner(s) and myself.  I personally evaluated the patient during the encounter    Daleen Bo, MD 02/03/21 [id number]

## 2021-02-02 NOTE — Progress Notes (Signed)
Pt brought his home machine but not ready to set up . Not sure if patient will get in house bed as of yet.. Informed patient to have nurse call when ready

## 2021-02-02 NOTE — Telephone Encounter (Signed)
Returned patient's call to discuss his blood thinners. Patient was not compliant with regular INR monitoring in the past and therefore I would be very hesitant to prescribe Coumadin. I recommended he remain on Eliquis 6m BID for the time being. I explained this to the patient, and he is adamant he would like to be on Coumadin because he did well on it previously without bleeding issues. He also mentions his skin pustules and thinks these are possibly related to Eliquis use (since he didn't have them when he was on the Coumadin).  Patient was unable to talk further as he was currently in the hospital/doctor's office for breathing concerns. Advised patient to schedule an office appointment ASAP to discuss further.

## 2021-02-02 NOTE — ED Provider Notes (Signed)
[facility] EMERGENCY DEPARTMENT Provider Note   CSN: [id number] Arrival date & time: 02/02/21  1707     History Chief Complaint  Patient presents with  . Shortness of Breath    Roberto [name] is a 60 y.o. male with a history of atrial fibrillation on Eliquis, history of CHF, pulmonary hypertension, type 2 diabetes and COPD per chart however patient denies this diagnosis presenting for evaluation of increasing shortness of breath for the past 2 days.  He also has a history of sleep apnea and has been unable to use his CPAP machine for the past 2 nights as he is unable to lie down without becoming significantly short of breath.  He has been sleeping in his recliner chair for the past 2 nights.  He also endorses 17 pound weight gain in the past month.  He is chronically on home oxygen at 5 L, per EMS he was satting 88% on 5 L probably returned to 95% on 6 L.  Patient denies significant cough, denies chest pain and has been afebrile.  He does endorse increased lower extremity edema.  He also endorses not being compliant with food choices, has had increased salt intake recently.  He also notes that he was recently placed on Eliquis, prior had been on Coumadin for years.  He is unhappy with Eliquis stating he has nosebleeds frequently since being placed on this medication.  He states his PCP will not switch him back to Coumadin although his cardiologist wants him on Coumadin since he is on a nontherapeutic dose of Eliquis at 5 mg (had severe nose bleeds when on 10 mg).   Additionally, notes a sharp stabbing, fleeting pain in his lower abdomen only when he bends forward in extreme (to tie his shoes).  Denies n/v, no obvious increased abdominal distention, no scrotal pain.   HPI     Past Medical History:  Diagnosis Date  . Asthma   . Atrial fibrillation ([place])   . CHF (congestive heart failure) ([place])   . COPD (chronic obstructive pulmonary disease) ([place])   . Diabetes mellitus without  complication ([place])   . Hypertension   . Pulmonary hypertension [facility])     Patient Active Problem List   Diagnosis Date Noted  . CHF, acute on chronic ([place]) 02/02/2021  . Lipoma of arm 12/30/2020  . Epistaxis 12/12/2020  . Skin pustule 12/12/2020  . Erectile dysfunction 01/02/2020  . Back spasm 01/02/2020  . Warfarin anticoagulation 10/10/2019  . OA (osteoarthritis) of knee 07/14/2019  . Monoarticular arthritis 05/17/2018  . Lesions of both ulnar nerves 04/11/2018  . Solitary pulmonary nodule 06/20/2017  . GERD without esophagitis 05/10/2017  . Acute deep vein thrombosis (DVT) of femoral vein of left lower extremity ([place])   . Essential hypertension   . Morbid obesity ([place])   . Dyspnea   . Bradycardia   . Saphenous vein clot, left 05/04/2017  . Cor pulmonale, chronic ([place]) 05/04/2017  . Type 2 diabetes mellitus with diabetic polyneuropathy, without long-term current use of insulin ([place])   . Chronic respiratory failure with hypoxia ([place])   . Acute deep vein thrombosis (DVT) of proximal vein of left lower extremity ([place])   . Combined congestive systolic and diastolic heart failure ([place])   . Atrial fibrillation with slow ventricular response ([place])   . Community acquired pneumonia     Past Surgical History:  Procedure Laterality Date  . SHOULDER SURGERY         Family History  Problem Relation  Age of Onset  . Diabetes Mother   . Cancer Mother   . Diabetes Father   . High blood pressure Father   . Diabetes Brother   . Diabetes Brother     Social History   Tobacco Use  . Smoking status: Never Smoker  . Smokeless tobacco: Never Used  Vaping Use  . Vaping Use: Never used  Substance Use Topics  . Alcohol use: No  . Drug use: No    Home Medications Prior to Admission medications   Medication Sig Start Date End Date Taking? Authorizing Provider  acetaminophen (TYLENOL) 325 MG tablet Take 2 tablets (650 mg total) by mouth every 6 (six) hours as needed. 03/08/18  Yes  Carlyle Dolly, MD  acetaminophen (TYLENOL) 650 MG CR tablet Take 1,300 mg by mouth every 8 (eight) hours as needed for pain.   Yes [provider]  Alcohol Swabs (B-D SINGLE USE SWABS REGULAR) PADS USE TWO TIMES DAILY 07/10/20  Yes Alcus Dad, MD  cholecalciferol (VITAMIN D) 1000 units tablet Take 1 tablet (1,000 Units total) by mouth daily. 03/08/18  Yes Gambino, Arlie Solomons, MD  ELIQUIS 5 MG TABS tablet TAKE 1 TABLET TWICE DAILY Patient taking differently: Take 5 mg by mouth daily. 11/15/20  Yes Alcus Dad, MD  gabapentin (NEURONTIN) 100 MG capsule Take 2 capsules (273m) twice daily 11/29/20  Yes WAlcus Dad MD  ibuprofen (ADVIL) 800 MG tablet Take 800 mg by mouth every 8 (eight) hours as needed.   Yes [provider]  metFORMIN (GLUCOPHAGE) 1000 MG tablet Take 1 tablet (1,000 mg total) by mouth daily. 03/09/20  Yes BSherene Sires DO  Multiple Vitamin (MULTIVITAMIN) tablet Take 1 tablet by mouth daily.   Yes [provider]  OXYGEN Inhale 5 L into the lungs continuous.   Yes [provider]  oxymetazoline (AFRIN) 0.05 % nasal spray 1 spray See admin instructions. 2 sprays into one nostril as needed for nasal congestion   Yes [provider]  Potassium Citrate 99 MG CAPS Take 1 capsule by mouth daily.   Yes [provider]  PRESCRIPTION MEDICATION Inhale into the lungs as needed (whenever sleeping - naps or at night). CPAP   Yes [provider]  torsemide (DEMADEX) 20 MG tablet TAKE 3 TABLETS EVERY DAY Patient taking differently: Take 60 mg by mouth every evening. 12/10/20  Yes Simmons-[name], Makiera, MD  ACCU-CHEK AVIVA PLUS test strip TEST BLOOD SUGAR TWICE DAILY 02/01/21   WAlcus Dad MD  Accu-Chek Softclix Lancets lancets USE TO TEST TWO TIMES DAILY 07/10/20   WAlcus Dad MD  Blood Glucose Monitoring Suppl (ACCU-CHEK AVIVA PLUS) w/Device KIT TEST TWO TIMES DAILY 08/06/18   BSherene Sires DO   cyclobenzaprine (FLEXERIL) 10 MG tablet Take 1 tablet (10 mg total) by mouth 3 (three) times daily as needed for muscle spasms. Patient not taking: No sig reported 12/30/19   BSherene Sires DO  Elastic Bandages & Supports (WRIST SUPPORT/ELASTIC/FIRM LG) MISC  05/28/18   [provider]  ferrous sulfate 325 (65 FE) MG tablet Take 325 mg by mouth daily. Patient not taking: No sig reported    [provider]  KLOR-CON M20 20 MEQ tablet Take 40 mEq by mouth 2 (two) times daily. Patient not taking: No sig reported 09/09/20   [provider]  metolazone (ZAROXOLYN) 2.5 MG tablet Take 2.5 mg by mouth daily. Patient not taking: No sig reported    [provider]  potassium chloride 20 MEQ TBCR  Take 40 mEq by mouth 2 (two) times daily for 2 doses. Patient not taking: Reported on 02/02/2021 09/09/20 09/10/20  Meccariello, Bernita Raisin, DO  sildenafil (VIAGRA) 25 MG tablet Take 1 tablet (25 mg total) by mouth daily as needed for erectile dysfunction. Patient not taking: No sig reported 12/30/19   Sherene Sires, DO  valsartan (DIOVAN) 40 MG tablet Take 40 mg by mouth daily. Patient not taking: No sig reported    [provider]    Allergies    Patient has no known allergies.  Review of Systems   Review of Systems  Constitutional: Negative for chills and fever.  HENT: Negative for congestion and sore throat.   Eyes: Negative.   Respiratory: Positive for shortness of breath. Negative for cough, chest tightness and wheezing.   Cardiovascular: Positive for leg swelling. Negative for chest pain and palpitations.  Gastrointestinal: Negative for abdominal pain, nausea and vomiting.  Genitourinary: Negative.   Musculoskeletal: Negative for arthralgias, joint swelling and neck pain.  Skin: Negative.  Negative for rash and wound.  Neurological: Negative for dizziness, weakness, light-headedness, numbness and headaches.  Psychiatric/Behavioral: Negative.   All other  systems reviewed and are negative.   Physical Exam Updated Vital Signs BP 125/81   Pulse 61   Temp 98.4 F (36.9 C) (Oral)   Resp 18   Ht _0  (1.753 m)   Wt (!) 168.3 kg   SpO2 98%   BMI 54.79 kg/m   Physical Exam Vitals and nursing note reviewed.  Constitutional:      Appearance: He is well-developed.  HENT:     Head: Normocephalic and atraumatic.  Eyes:     Conjunctiva/sclera: Conjunctivae normal.  Cardiovascular:     Rate and Rhythm: Normal rate. Rhythm irregular.     Heart sounds: Normal heart sounds.  Pulmonary:     Effort: Pulmonary effort is normal.     Breath sounds: Decreased breath sounds present. No wheezing, rhonchi or rales.  Chest:     Chest wall: No tenderness.  Abdominal:     General: Abdomen is protuberant. Bowel sounds are normal.     Palpations: Abdomen is soft.     Tenderness: There is no abdominal tenderness.  Musculoskeletal:        General: Normal range of motion.     Cervical back: Normal range of motion.     Right lower leg: Edema present.     Left lower leg: Edema present.     Comments: Bilateral lower extremity pitting edema to upper tibia.  Skin:    General: Skin is warm and dry.  Neurological:     Mental Status: He is alert.     ED Results / Procedures / Treatments   Labs (all labs ordered are listed, but only abnormal results are displayed) Labs Reviewed  CBC WITH DIFFERENTIAL/PLATELET - Abnormal; Notable for the following components:      Result Value   RBC 4.04 (*)    Hemoglobin 11.4 (*)    HCT 38.3 (*)    MCHC 29.8 (*)    RDW 17.8 (*)    Abs Immature Granulocytes 0.09 (*)    All other components within normal limits  BASIC METABOLIC PANEL - Abnormal; Notable for the following components:   Chloride 93 (*)    Glucose, Bld 228 (*)    BUN 95 (*)    Creatinine, Ser 2.21 (*)    GFR, Estimated 33 (*)    All other components within normal limits  BRAIN NATRIURETIC PEPTIDE - Abnormal; Notable for the following components:    B Natriuretic Peptide 458.0 (*)    All other components within normal limits  TROPONIN I (HIGH SENSITIVITY) - Abnormal; Notable for the following components:   Troponin I (High Sensitivity) 37 (*)    All other components within normal limits  MAGNESIUM  TROPONIN I (HIGH SENSITIVITY)    EKG EKG Interpretation  Date/Time:  Wednesday Feb 02 2021 19:20:46 EDT Ventricular Rate:  78 PR Interval:  172 QRS Duration: 127 QT Interval:  393 QTC Calculation: 448 R Axis:   95 Text Interpretation: Unknown rhythm, irregular rate Right bundle branch block Repol abnrm suggests ischemia, diffuse leads Since last tracing now with RBBB Otherwise no significant change Confirmed by Daleen Bo [phone]) on 02/02/2021 7:30:25 PM   Radiology DG Chest Portable 1 View  Result Date: 02/02/2021 CLINICAL DATA:  Short of breath EXAM: PORTABLE CHEST 1 VIEW COMPARISON:  05/09/2020 FINDINGS: Cardiomegaly with vascular congestion. No pleural effusion, consolidation or pneumothorax IMPRESSION: Cardiomegaly with vascular congestion Electronically Signed   By: Donavan Foil M.D.   On: 02/02/2021 18:20    Procedures Procedures   Medications Ordered in ED Medications  furosemide (LASIX) injection 80 mg (80 mg Intravenous Not Given 02/02/21 1941)  furosemide (LASIX) injection 80 mg (80 mg Intramuscular Given 02/02/21 1937)    ED Course  I have reviewed the triage vital signs and the nursing notes.  Pertinent labs & imaging results that were available during my care of the patient were reviewed by me and considered in my medical decision making (see chart for details).    MDM Rules/Calculators/A&P                          Patient with acute on chronic CHF with increasing hypoxia, 17 pound weight gain over the past month, chest x-ray suggesting pulmonary edema, BNP is elevated at 458.  He also has acute on chronic renal insufficiency with today's creatinine at 2.21.  His first troponin is slightly elevated at  37, patient denies chest pain, I suspect this elevation is secondary to renal insufficiency.  A delta troponin has been ordered.  Patient will require admission for treatment of his CHF.  Call placed to the hospitalist service. Discussed with Dr [facility]ron [facility] who accepts pt for admission.    Of note, pt received IV lasix 80 mg only, not IM dose.   Final Clinical Impression(s) / ED Diagnoses Final diagnoses:  Acute on chronic congestive heart failure, unspecified heart failure type [facility])    Rx / DC Orders ED Discharge Orders    None       Landis Martins 02/02/21 1942    Daleen Bo, MD 02/03/21 [phone]

## 2021-02-02 NOTE — Telephone Encounter (Signed)
Patient returns call to nurse line in regards to previous message. Patient reports that he would like referral sent to [facility] for skilled nursing to manage coumadin.   Patient still declines to schedule an appointment, as he would like to start coumadin therapy and then follow up in office with provider and to get labs checked.   Please advise.   Talbot Grumbling, RN

## 2021-02-02 NOTE — ED Triage Notes (Addendum)
Pt brought in by [facility] from home with c/o SOB when lying down x 2 days. Pt wears cpap at night but has been unable to the last 2 nights because he couldn't lay down. EMS reports O2 sat of 88% on 5L at home. They increased his oxygen to 6L and O2 sat improved to 95%. Pt also has some questions/confusion about his blood thinner medication. Pt reports he is currently on Eliquis per his PCP but his cardiologist wants him on Warfarin. He is requesting help to determine which medication to take. Pt reports a 17lb weight gain in the last month.

## 2021-02-03 DIAGNOSIS — I5043 Acute on chronic combined systolic (congestive) and diastolic (congestive) heart failure: Secondary | ICD-10-CM | POA: Diagnosis not present

## 2021-02-03 DIAGNOSIS — J9611 Chronic respiratory failure with hypoxia: Secondary | ICD-10-CM | POA: Diagnosis not present

## 2021-02-03 DIAGNOSIS — J9610 Chronic respiratory failure, unspecified whether with hypoxia or hypercapnia: Secondary | ICD-10-CM

## 2021-02-03 DIAGNOSIS — I509 Heart failure, unspecified: Secondary | ICD-10-CM | POA: Diagnosis not present

## 2021-02-03 DIAGNOSIS — I4891 Unspecified atrial fibrillation: Secondary | ICD-10-CM | POA: Diagnosis not present

## 2021-02-03 LAB — CBC WITH DIFFERENTIAL/PLATELET
Abs Immature Granulocytes: 0.06 10*3/uL (ref 0.00–0.07)
Basophils Absolute: 0.1 10*3/uL (ref 0.0–0.1)
Basophils Relative: 1 %
Eosinophils Absolute: 0.1 10*3/uL (ref 0.0–0.5)
Eosinophils Relative: 1 %
HCT: 37.5 % — ABNORMAL LOW (ref 39.0–52.0)
Hemoglobin: 11.3 g/dL — ABNORMAL LOW (ref 13.0–17.0)
Immature Granulocytes: 1 %
Lymphocytes Relative: 10 %
Lymphs Abs: 0.9 10*3/uL (ref 0.7–4.0)
MCH: 28.5 pg (ref 26.0–34.0)
MCHC: 30.1 g/dL (ref 30.0–36.0)
MCV: 94.7 fL (ref 80.0–100.0)
Monocytes Absolute: 0.9 10*3/uL (ref 0.1–1.0)
Monocytes Relative: 10 %
Neutro Abs: 7 10*3/uL (ref 1.7–7.7)
Neutrophils Relative %: 77 %
Platelets: 294 10*3/uL (ref 150–400)
RBC: 3.96 MIL/uL — ABNORMAL LOW (ref 4.22–5.81)
RDW: 17.7 % — ABNORMAL HIGH (ref 11.5–15.5)
WBC: 9 10*3/uL (ref 4.0–10.5)
nRBC: 0 % (ref 0.0–0.2)

## 2021-02-03 LAB — BASIC METABOLIC PANEL
Anion gap: 9 (ref 5–15)
BUN: 90 mg/dL — ABNORMAL HIGH (ref 6–20)
CO2: 34 mmol/L — ABNORMAL HIGH (ref 22–32)
Calcium: 9.4 mg/dL (ref 8.9–10.3)
Chloride: 94 mmol/L — ABNORMAL LOW (ref 98–111)
Creatinine, Ser: 1.91 mg/dL — ABNORMAL HIGH (ref 0.61–1.24)
GFR, Estimated: 40 mL/min — ABNORMAL LOW (ref >60)
Glucose, Bld: 173 mg/dL — ABNORMAL HIGH (ref 70–99)
Potassium: 3.1 mmol/L — ABNORMAL LOW (ref 3.5–5.1)
Sodium: 137 mmol/L (ref 135–145)

## 2021-02-03 LAB — GLUCOSE, CAPILLARY
Glucose-Capillary: 156 mg/dL — ABNORMAL HIGH (ref 70–99)
Glucose-Capillary: 231 mg/dL — ABNORMAL HIGH (ref 70–99)
Glucose-Capillary: 238 mg/dL — ABNORMAL HIGH (ref 70–99)
Glucose-Capillary: 228 mg/dL — ABNORMAL HIGH (ref 70–99)

## 2021-02-03 LAB — HEMOGLOBIN A1C
Hgb A1c MFr Bld: 8.7 % — ABNORMAL HIGH (ref 4.8–5.6)
Mean Plasma Glucose: 202.99 mg/dL

## 2021-02-03 LAB — PROTIME-INR
INR: 1.3 — ABNORMAL HIGH (ref 0.8–1.2)
Prothrombin Time: 16.2 seconds — ABNORMAL HIGH (ref 11.4–15.2)

## 2021-02-03 LAB — SARS CORONAVIRUS 2 (TAT 6-24 HRS): SARS Coronavirus 2: NEGATIVE

## 2021-02-03 LAB — MAGNESIUM: Magnesium: 1.8 mg/dL (ref 1.7–2.4)

## 2021-02-03 MED ORDER — POTASSIUM CHLORIDE CRYS ER 20 MEQ PO TBCR
40.0000 meq | EXTENDED_RELEASE_TABLET | ORAL | Status: AC
  Administered 2021-02-03 (×2): 40 meq via ORAL
  Filled 2021-02-03 (×2): qty 2

## 2021-02-03 MED ORDER — APIXABAN 5 MG PO TABS
5.0000 mg | ORAL_TABLET | Freq: Two times a day (BID) | ORAL | Status: DC
  Administered 2021-02-03: 5 mg via ORAL
  Filled 2021-02-03: qty 1

## 2021-02-03 MED ORDER — WARFARIN SODIUM 5 MG PO TABS
10.0000 mg | ORAL_TABLET | ORAL | Status: AC
  Administered 2021-02-03: 10 mg via ORAL
  Filled 2021-02-03: qty 2

## 2021-02-03 MED ORDER — ENOXAPARIN SODIUM 300 MG/3ML IJ SOLN
165.0000 mg | Freq: Two times a day (BID) | INTRAMUSCULAR | Status: DC
  Administered 2021-02-03 – 2021-02-06 (×5): 165 mg via SUBCUTANEOUS
  Filled 2021-02-03 (×9): qty 1.65

## 2021-02-03 MED ORDER — GABAPENTIN 100 MG PO CAPS
200.0000 mg | ORAL_CAPSULE | Freq: Two times a day (BID) | ORAL | Status: DC
  Administered 2021-02-03 – 2021-02-04 (×3): 200 mg via ORAL
  Filled 2021-02-03 (×3): qty 2

## 2021-02-03 MED ORDER — WARFARIN - PHARMACIST DOSING INPATIENT: Freq: Every day | Status: DC

## 2021-02-03 NOTE — Progress Notes (Signed)
PROGRESS NOTE    Roberto [name]  ZJQ:[id number] DOB: [birth year] DOA: 02/02/2021 PCP: Alcus Dad, MD    Brief Narrative:  60-year-old male with a history of severe pulmonary hypertension, chronic right heart failure, primary atrial fibrillation, admitted to the hospital with decompensated CHF.  He is approximately 20 pounds above his dry weight.  He is admitted for IV diuresis.   Assessment & Plan:   Active Problems:   Atrial fibrillation with slow ventricular response (HCC)   Chronic respiratory failure with hypoxia (HCC)   Cor pulmonale, chronic (HCC)   Acute deep vein thrombosis (DVT) of femoral vein of left lower extremity (HCC)   GERD without esophagitis   CHF, acute on chronic (HCC)   Acute on chronic diastolic congestive heart failure Severe pulmonary hypertension Cor pulmonale -Patient reports that his dry weight is approximately 350 pounds -Admission weight noted to be 370 pounds -Currently on intravenous Lasix -Appreciate cardiology assistance -Metolazone has been discontinued for now -Once he is adequately diuresed, he will need follow-up but [facility] for right heart catheterization -He could potentially be placed on specific therapies for pulmonary hypertension -Continue compression wraps -Continue to monitor intake and output  Chronic respiratory failure with hypoxia -Chronically on 5 L of oxygen -Continue to monitor  Severe obstructive sleep apnea/obesity hypoventilation -Continue on nightly BiPAP  Chronic atrial fibrillation -Currently rate controlled -He is chronically on Eliquis for anticoagulation, but wishes to discontinue this in favor of Coumadin -He has been on Coumadin in the past and feels that he has had less bleeding episodes when on Coumadin.  He also attributes the development of nodules on his fingers to Eliquis -Per cardiology, okay to transition to Coumadin -We will start on Coumadin per pharmacy with Lovenox bridge  Chronic  kidney disease stage IIIb -Creatinine currently at baseline at 1.9 -Continue to monitor in the setting of diuresis -Hold ARB for now  Diabetes type 2 with neuropathy -Hold metformin -Continue on sliding insulin -Continue on gabapentin  Obesity, class III BMI 54.3 -Counseled on importance of diet and exercise -Exercise is somewhat limited due to severe right knee DJD as well as respiratory limitations   DVT prophylaxis:  apixaban (ELIQUIS) tablet 5 mg  Code Status: Full code Family Communication: Discussed with patient Disposition Plan: Status is: Inpatient  Remains inpatient appropriate because:IV treatments appropriate due to intensity of illness or inability to take PO and Inpatient level of care appropriate due to severity of illness   Dispo: The patient is from: Home              Anticipated d/c is to: Home              Patient currently is not medically stable to d/c.   Difficult to place patient No   Consultants:   Cardiology  Procedures:     Antimicrobials:       Subjective: Feels her breathing is improving although not back to baseline as of yet.  Objective: Vitals:   02/03/21 0500 02/03/21 0545 02/03/21 0908 02/03/21 1232  BP:   (!) 92/45 101/66  Pulse:  67 66 62  Resp:  _0 Temp:   97.7 F (36.5 C) (!) 97.4 F (36.3 C)  TempSrc:   Oral Oral  SpO2:  94% 92% 92%  Weight: (!) 167 kg     Height:        Intake/Output Summary (Last 24 hours) at 02/03/2021 1841 Last data filed at 02/03/2021 1300 Gross per 24  hour  Intake 720 ml  Output 2125 ml  Net -1405 ml   Filed Weights   02/02/21 1722 02/03/21 0500  Weight: (!) 168.3 kg (!) 167 kg    Examination:  General exam: Appears calm and comfortable  Respiratory system: bibasilar crackles. Respiratory effort normal. Cardiovascular system: S1 & S2 heard, RRR. No JVD, murmurs, rubs, gallops or clicks. Gastrointestinal system: Abdomen is nondistended, soft and nontender. No organomegaly or  masses felt. Normal bowel sounds heard. Central nervous system: Alert and oriented. No focal neurological deficits. Extremities: bilateral lower extremity edema with compression socks on Skin: No rashes, lesions or ulcers Psychiatry: Judgement and insight appear normal. Mood & affect appropriate.     Data Reviewed: I have personally reviewed following labs and imaging studies  CBC: Recent Labs  Lab 02/02/21 1730 02/03/21 0539  WBC 9.6 9.0  NEUTROABS 7.7 7.0  HGB 11.4* 11.3*  HCT 38.3* 37.5*  MCV 94.8 94.7  PLT 315 081   Basic Metabolic Panel: Recent Labs  Lab 02/02/21 1730 02/03/21 0539  NA 135 137  K 3.6 3.1*  CL 93* 94*  CO2 27 34*  GLUCOSE 228* 173*  BUN 95* 90*  CREATININE 2.21* 1.91*  CALCIUM 8.9 9.4  MG 1.8 1.8   GFR: Estimated Creatinine Clearance: 63.5 mL/min (A) (by C-G formula based on SCr of 1.91 mg/dL (H)). Liver Function Tests: No results for input(s): AST, ALT, ALKPHOS, BILITOT, PROT, ALBUMIN in the last 168 hours. No results for input(s): LIPASE, AMYLASE in the last 168 hours. No results for input(s): AMMONIA in the last 168 hours. Coagulation Profile: No results for input(s): INR, PROTIME in the last 168 hours. Cardiac Enzymes: No results for input(s): CKTOTAL, CKMB, CKMBINDEX, TROPONINI in the last 168 hours. BNP (last 3 results) No results for input(s): PROBNP in the last 8760 hours. HbA1C: Recent Labs    02/02/21 2032  HGBA1C 8.7*   CBG: Recent Labs  Lab 02/02/21 2134 02/03/21 0725 02/03/21 1122 02/03/21 1606  GLUCAP 144* 156* 231* 238*   Lipid Profile: No results for input(s): CHOL, HDL, LDLCALC, TRIG, CHOLHDL, LDLDIRECT in the last 72 hours. Thyroid Function Tests: No results for input(s): TSH, T4TOTAL, FREET4, T3FREE, THYROIDAB in the last 72 hours. Anemia Panel: No results for input(s): VITAMINB12, FOLATE, FERRITIN, TIBC, IRON, RETICCTPCT in the last 72 hours. Sepsis Labs: No results for input(s): PROCALCITON, LATICACIDVEN  in the last 168 hours.  Recent Results (from the past 240 hour(s))  SARS CORONAVIRUS 2 (TAT 6-24 HRS) Nasopharyngeal Nasopharyngeal Swab     Status: None   Collection Time: 02/02/21  7:45 PM   Specimen: Nasopharyngeal Swab  Result Value Ref Range Status   SARS Coronavirus 2 NEGATIVE NEGATIVE Final    Comment: (NOTE) SARS-CoV-2 target nucleic acids are NOT DETECTED.  The SARS-CoV-2 RNA is generally detectable in upper and lower respiratory specimens during the acute phase of infection. Negative results do not preclude SARS-CoV-2 infection, do not rule out co-infections with other pathogens, and should not be used as the sole basis for treatment or other patient management decisions. Negative results must be combined with clinical observations, patient history, and epidemiological information. The expected result is Negative.  Fact Sheet for Patients: SugarRoll.be  Fact Sheet for Healthcare Providers: https://www.woods-mathews.com/  This test is not yet approved or cleared by the [place] FDA and  has been authorized for detection and/or diagnosis of SARS-CoV-2 by FDA under an Emergency Use Authorization (EUA). This EUA will remain  in effect (meaning this  test can be used) for the duration of the COVID-19 declaration under Se ction 564(b)(1) of the Act, 21 U.S.C. section 360bbb-3(b)(1), unless the authorization is terminated or revoked sooner.  Performed at [facility], [place] [address] [place], [place] [address]          Radiology Studies: DG Chest Portable 1 View  Result Date: 02/02/2021 CLINICAL DATA:  Short of breath EXAM: PORTABLE CHEST 1 VIEW COMPARISON:  05/09/2020 FINDINGS: Cardiomegaly with vascular congestion. No pleural effusion, consolidation or pneumothorax IMPRESSION: Cardiomegaly with vascular congestion Electronically Signed   By: Donavan Foil M.D.   On: 02/02/2021 18:20        Scheduled Meds: .  apixaban  5 mg Oral BID  . furosemide  60 mg Intravenous BID  . gabapentin  200 mg Oral BID  . insulin aspart  0-15 Units Subcutaneous TID WC  . insulin aspart  0-5 Units Subcutaneous QHS   Continuous Infusions:   LOS: 1 day    Time spent: 22mns    JKathie Dike MD [facility]   If 7PM-7AM, please contact night-coverage [email or web address]  02/03/2021, 6:41 PM

## 2021-02-03 NOTE — TOC Initial Note (Signed)
Transition of Care [facility]) - Initial/Assessment Note    Patient Details  Name: Roberto [name] MRN: [id number] Date of Birth: 12-Jun-1961  Transition of [facility]) CM/SW Contact:    Iona Beard, [place] Phone Number: 02/03/2021, 11:00 AM  Clinical Narrative:                 [facility] consulted for CHF screen. CSW spoke with pt to complete assessment. Pt states that he lives alone. Pt states that he can complete ADLs independently. Pt states that he does not drive much, if he cannot get to his appointments on his scooter he will get transportation provided by [employer]. Pt states that he has not had [place] services. Pt has a scooter, a cane, and a rollator. Pt is chronic on 5L O2 supplied through [employer].   Pt states that he weighs himself every three days. CSW educated pt on importance of daily weights. Pt states that he does take all medications as prescribed. Pt does not follow a hearty healthy diet. CSW educated pt on importance of this. Pts cardiologist listed in his chart is Dr. Marlou Porch. TOC to follow.   Expected Discharge Plan: Home/Self Care Barriers to Discharge: Continued Medical Work up   Patient Goals and CMS Choice Patient states their goals for this hospitalization and ongoing recovery are:: Go home CMS Medicare.gov Compare Post Acute Care list provided to:: Patient Choice offered to / list presented to : Patient  Expected Discharge Plan and Services Expected Discharge Plan: Home/Self Care In-house Referral: Clinical Social Work Discharge Planning Services: CM Consult Post Acute Care Choice: NA Living arrangements for the past 2 months: Apartment                 DME Arranged: N/A DME Agency: NA       HH Arranged: NA [place] Agency: NA        Prior Living Arrangements/Services Living arrangements for the past 2 months: Apartment Lives with:: Self Patient language and need for interpreter reviewed:: Yes Do you feel safe going back to the place where you live?: Yes      Need  for Family Participation in Patient Care: No (Comment) Care giver support system in place?: Yes (comment) Current home services: DME Criminal Activity/Legal Involvement Pertinent to Current Situation/Hospitalization: No - Comment as needed  Activities of Daily Living Home Assistive Devices/Equipment: Cane (specify quad or straight),CPAP,Oxygen,Walker (specify type),Other (Comment) (Rollator, electric scooter) ADL Screening (condition at time of admission) Patient's cognitive ability adequate to safely complete daily activities?: Yes Is the patient deaf or have difficulty hearing?: No Does the patient have difficulty seeing, even when wearing glasses/contacts?: No Does the patient have difficulty concentrating, remembering, or making decisions?: No Patient able to express need for assistance with ADLs?: Yes Does the patient have difficulty dressing or bathing?: No Independently performs ADLs?: Yes (appropriate for developmental age) Does the patient have difficulty walking or climbing stairs?: Yes Weakness of Legs: Left Weakness of Arms/Hands: None  Permission Sought/Granted                  Emotional Assessment Appearance:: Appears stated age Attitude/Demeanor/Rapport: Engaged Affect (typically observed): Accepting Orientation: : Oriented to Self,Oriented to Place,Oriented to  Time,Oriented to Situation Alcohol / Substance Use: Not Applicable Psych Involvement: No (comment)  Admission diagnosis:  CHF, acute on chronic (HCC) [I50.9] Acute on chronic congestive heart failure, unspecified heart failure type [facility]) [I50.9] Patient Active Problem List   Diagnosis Date Noted  . CHF, acute on chronic (  [place]) 02/02/2021  . Lipoma of arm 12/30/2020  . Epistaxis 12/12/2020  . Skin pustule 12/12/2020  . Erectile dysfunction 01/02/2020  . Back spasm 01/02/2020  . Warfarin anticoagulation 10/10/2019  . OA (osteoarthritis) of knee 07/14/2019  . Monoarticular arthritis 05/17/2018  .  Lesions of both ulnar nerves 04/11/2018  . Solitary pulmonary nodule 06/20/2017  . GERD without esophagitis 05/10/2017  . Acute deep vein thrombosis (DVT) of femoral vein of left lower extremity ([place])   . Essential hypertension   . Morbid obesity ([place])   . Dyspnea   . Bradycardia   . Saphenous vein clot, left 05/04/2017  . Cor pulmonale, chronic ([place]) 05/04/2017  . Type 2 diabetes mellitus with diabetic polyneuropathy, without long-term current use of insulin ([place])   . Chronic respiratory failure with hypoxia ([place])   . Acute deep vein thrombosis (DVT) of proximal vein of left lower extremity ([place])   . Combined congestive systolic and diastolic heart failure ([place])   . Atrial fibrillation with slow ventricular response ([place])   . Community acquired pneumonia    PCP:  Alcus Dad, MD Pharmacy:   [facility], [place] [address] Phone: [phone] Fax: [phone]  [facility] [address] [place] [address] Phone: [phone] Fax: [phone]     Social Determinants of Health (SDOH) Interventions    Readmission Risk Interventions Readmission Risk Prevention Plan 02/03/2021  Medication Screening Complete  Transportation Screening Complete  Some recent data might be hidden

## 2021-02-03 NOTE — Consult Note (Signed)
Cardiology Consultation:   Patient ID: Roberto [name] MRN: [id number]; DOB: 09-Feb-1961  Admit date: 02/02/2021 Date of Consult: 02/03/2021  PCP:  Alcus Dad, MD   [facility] Providers Cardiologist:  Candee Furbish, MD / Currently follows at [facility] in [place] (Cathie Hoops, MD)   Patient Profile:   Roberto [name] is a 60 y.o. male with a hx of hx of chronic atrial fibrillation, chronic diastolic heart failure, prior DVT (left leg 2018), morbid obesity, cor pulmonale, COPD, on home oxygen 5L, HTN, GERD, venous insufficiency, DM and severe OSA on CPAP who is being seen 02/03/2021 for the evaluation of worsening dyspnea at the request of Kathie Dike, MD.  History of Present Illness:   Roberto [name] sees a cardiologist in [facility]) and a Pulmonary HTN specialist in [place]. For his hx of pulmonary HTN, he is apparently scheduled for a RHC in June 2022. Patient states that he has been taking Metolazone and torsemide daily for ~6 months now. Previously used to be on aldactone as well which was discontinued for hyperkalemia. He admits to significant depression due to his chronic illnesses. He has been eating and drinking without abiding to any salt or calorie restriction. For the past 2-3 days he noticed worsening resting SOB and leg edema. He therefore decided to come to the hospital to get checked out.   He denies any history of MI or heart catheterizations. He has never been a smoker and does not drink alcohol. History of atrial fibrillation with one previous attempted cardioversion. As far as he knows, he has been maintaining afib and has had low heart rates for years. He had been anticoagulated first for afib, then developed DVT. He failed Xarelto with increased DVT and then was on warfarin. Unclear when this was stopped but currently he takes Eliquis. Does not follow the recommended dosing and is on 67m once daily. He states he has had discussions with his primary  cardiologist and the patient has complaints of nose bleeds and some skin lesions that he believes are a side effect of the Eliquis.   In the ED, vitals were: BP 139/73 mmHg, HR 80s. Labs were: K 3.6, gluc 228, BUN 95, Cr 2.21, Mg 1.8, BNP 458, Hgb 11.4, Plts 315, hs-Trops 37 and 38. ECG from 02/02/21 (at 19:20) that I reviewed personally showed AFIB, rate 78 bpm, RBBB, repolarization abnormalities. CXR: Cardiomegaly with vascular congestion  Given Lasix 80 mg IV x 1 on 02/02/21. UOP 1.5 L since yesterday.   Past Medical History:  Diagnosis Date  . Asthma   . Atrial fibrillation ([place]   . CHF (congestive heart failure) ([place]   . COPD (chronic obstructive pulmonary disease) ([place]   . Diabetes mellitus without complication ([place]   . Hypertension   . Pulmonary hypertension ([place]     Past Surgical History:  Procedure Laterality Date  . SHOULDER SURGERY       Home Medications:  Prior to Admission medications   Medication Sig Start Date End Date Taking? Authorizing Provider  acetaminophen (TYLENOL) 325 MG tablet Take 2 tablets (650 mg total) by mouth every 6 (six) hours as needed. 03/08/18  Yes GCarlyle Dolly MD  acetaminophen (TYLENOL) 650 MG CR tablet Take 1,300 mg by mouth every 8 (eight) hours as needed for pain.   Yes [provider]  Alcohol Swabs (B-D SINGLE USE SWABS REGULAR) PADS USE TWO TIMES DAILY 07/10/20  Yes WAlcus Dad MD  cholecalciferol (VITAMIN D) 1000 units tablet Take 1  tablet (1,000 Units total) by mouth daily. 03/08/18  Yes Gambino, Arlie Solomons, MD  ELIQUIS 5 MG TABS tablet TAKE 1 TABLET TWICE DAILY Patient taking differently: Take 5 mg by mouth daily. 11/15/20  Yes Alcus Dad, MD  gabapentin (NEURONTIN) 100 MG capsule Take 2 capsules (249m) twice daily 11/29/20  Yes WAlcus Dad MD  ibuprofen (ADVIL) 800 MG tablet Take 800 mg by mouth every 8 (eight) hours as needed.   Yes [provider]  metFORMIN (GLUCOPHAGE) 1000 MG tablet Take  1 tablet (1,000 mg total) by mouth daily. 03/09/20  Yes BSherene Sires DO  Multiple Vitamin (MULTIVITAMIN) tablet Take 1 tablet by mouth daily.   Yes [provider]  OXYGEN Inhale 5 L into the lungs continuous.   Yes [provider]  oxymetazoline (AFRIN) 0.05 % nasal spray 1 spray See admin instructions. 2 sprays into one nostril as needed for nasal congestion   Yes [provider]  Potassium Citrate 99 MG CAPS Take 1 capsule by mouth daily.   Yes [provider]  PRESCRIPTION MEDICATION Inhale into the lungs as needed (whenever sleeping - naps or at night). CPAP   Yes [provider]  torsemide (DEMADEX) 20 MG tablet TAKE 3 TABLETS EVERY DAY Patient taking differently: Take 60 mg by mouth every evening. 12/10/20  Yes Simmons-[name], [place], MD  ACCU-CHEK AVIVA PLUS test strip TEST BLOOD SUGAR TWICE DAILY 02/01/21   WAlcus Dad MD  Accu-Chek Softclix Lancets lancets USE TO TEST TWO TIMES DAILY 07/10/20   WAlcus Dad MD  Blood Glucose Monitoring Suppl (ACCU-CHEK AVIVA PLUS) w/Device KIT TEST TWO TIMES DAILY 08/06/18   BSherene Sires DO  cyclobenzaprine (FLEXERIL) 10 MG tablet Take 1 tablet (10 mg total) by mouth 3 (three) times daily as needed for muscle spasms. Patient not taking: No sig reported 12/30/19   BSherene Sires DO  Elastic Bandages & Supports (WRIST SUPPORT/ELASTIC/FIRM LG) MISC  05/28/18   [provider]  ferrous sulfate 325 (65 FE) MG tablet Take 325 mg by mouth daily. Patient not taking: No sig reported    [provider]  KLOR-CON M20 20 MEQ tablet Take 40 mEq by mouth 2 (two) times daily. Patient not taking: No sig reported 09/09/20   [provider]  metolazone (ZAROXOLYN) 2.5 MG tablet Take 2.5 mg by mouth daily. Patient not taking: No sig reported    [provider]  potassium chloride 20 MEQ TBCR Take 40 mEq by mouth 2 (two) times daily for 2 doses. Patient not taking: Reported on 02/02/2021  09/09/20 09/10/20  Meccariello, BBernita Raisin DO  sildenafil (VIAGRA) 25 MG tablet Take 1 tablet (25 mg total) by mouth daily as needed for erectile dysfunction. Patient not taking: No sig reported 12/30/19   BSherene Sires DO  valsartan (DIOVAN) 40 MG tablet Take 40 mg by mouth daily. Patient not taking: No sig reported    [provider]    Inpatient Medications: Scheduled Meds: . apixaban  5 mg Oral BID  . furosemide  60 mg Intravenous BID  . gabapentin  200 mg Oral BID  . insulin aspart  0-15 Units Subcutaneous TID WC  . insulin aspart  0-5 Units Subcutaneous QHS   Continuous Infusions:  PRN Meds: acetaminophen  Allergies:   No Known Allergies  Social History:   Social History   Socioeconomic History  . Marital status: Single    Spouse name: Not on file  . Number of children: 2  . Years of education:  12th grade  . Highest education level: High school graduate  Occupational History  . 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 Smoking status: Never Smoker  . Smokeless tobacco: Never Used  Vaping Use  . Vaping Use: Never used  Substance and Sexual Activity  . Alcohol use: No  . Drug use: No  . Sexual activity: Not Currently    Birth control/protection: Condom  Other Topics Concern  . Not on file  Social History Narrative   Lives in senior apartments, lives alone. Ground floor. Has social activities daily. Uses treadmill. No steps, grab rails in bathroom, smoke alarm, wears seat belt. No firearms.    Social Determinants of Health   Financial Resource Strain: Not on file  Food Insecurity: Not on file  Transportation Needs: Not on file  Physical Activity: Not on file  Stress: Not on file  Social Connections: Not on file  Intimate Partner Violence: Not on file    Family History:    Family History  Problem Relation Age of Onset  . Diabetes Mother   . Cancer Mother   . Diabetes Father   . High blood pressure Father    . Diabetes Brother   . Diabetes Brother      ROS:  Please see the history of present illness.   All other ROS reviewed and negative.     Physical Exam/Data:   Vitals:   02/03/21 0405 02/03/21 0500 02/03/21 0545 02/03/21 0908  BP: 103/62   (!) 92/45  Pulse: 62  67 66  Resp: _0 Temp: 98 F (36.7 C)   97.7 F (36.5 C)  TempSrc:    Oral  SpO2: 92%  94% 92%  Weight:  (!) 167 kg    Height:        Intake/Output Summary (Last 24 hours) at 02/03/2021 0913 Last data filed at 02/02/2021 2227 Gross per 24 hour  Intake --  Output 1525 ml  Net -1525 ml   Last 3 Weights 02/03/2021 02/02/2021 01/31/2021  Weight (lbs) 368 lb 2.7 oz 371 lb 352 lb  Weight (kg) 167 kg 168.284 kg 159.666 kg     Body mass index is 54.37 kg/m.  General:  Morbidly obese, in no acute distress HEENT: normal Lymph: no adenopathy Neck: cannot assess due to body habitus Endocrine:  No thryomegaly Vascular: No carotid bruits; FA pulses 2+ bilaterally without bruits  Cardiac:  normal S1, S2; no murmurs, irregular  Lungs:  Decreased breath sounds at bases, otherwise CTA  Abd: soft, nontender  Ext: edema present B/L LEs Musculoskeletal:  No deformities, BUE and BLE strength normal and equal Skin: warm and dry  Neuro:  CNs 2-12 intact, no focal abnormalities noted Psych:  Flat affect  EKG:  ECG from 02/02/21 (at 19:20) that I reviewed personally showed AFIB, rate 78 bpm, RBBB, repolarization abnormalities.  Telemetry:  Telemetry was personally reviewed and demonstrates: AFIB, rate 70s   Laboratory Data:  High Sensitivity Troponin:   Recent Labs  Lab 02/02/21 1730 02/02/21 1944  TROPONINIHS 37* 38*     Chemistry Recent Labs  Lab 02/02/21 1730 02/03/21 0539  NA 135 137  K 3.6 3.1*  CL 93* 94*  CO2 27 34*  GLUCOSE 228* 173*  BUN 95* 90*  CREATININE 2.21* 1.91*  CALCIUM 8.9 9.4  GFRNONAA 33* 40*  ANIONGAP 15 9    No results for input(s): PROT, ALBUMIN, AST, ALT, ALKPHOS, BILITOT in the  last 168 hours. Hematology Recent Labs  Lab 02/02/21 1730 02/03/21 0539  WBC 9.6 9.0  RBC 4.04* 3.96*  HGB 11.4* 11.3*  HCT 38.3* 37.5*  MCV 94.8 94.7  MCH 28.2 28.5  MCHC 29.8* 30.1  RDW 17.8* 17.7*  PLT 315 294   BNP Recent Labs  Lab 02/02/21 1730  BNP 458.0*    DDimer No results for input(s): DDIMER in the last 168 hours.   Radiology/Studies:  DG Chest Portable 1 View  Result Date: 02/02/2021 CLINICAL DATA:  Short of breath EXAM: PORTABLE CHEST 1 VIEW COMPARISON:  05/09/2020 FINDINGS: Cardiomegaly with vascular congestion. No pleural effusion, consolidation or pneumothorax IMPRESSION: Cardiomegaly with vascular congestion Electronically Signed   By: Donavan Foil M.D.   On: 02/02/2021 18:20    Relevant CV studies Echocardiogram 04/15/2017 Study Conclusions - Left ventricle: The cavity size was normal. There was severe concentric hypertrophy. Systolic function was normal. The estimated ejection fraction was in the range of 60% to 65%. Wall motion was normal; there were no regional wall motion abnormalities. - Aortic valve: There was trivial regurgitation. - Aorta: Ascending aorta diameter: 41 mm (ED). - Ascending aorta: The ascending aorta was mildly dilated. - Mitral valve: There was mild regurgitation. - Right ventricle: The cavity size was severely dilated. Wall thickness was normal. Systolic function was severely reduced. - Atrial septum: There was increased thickness of the septum, consistent with lipomatous hypertrophy. - Pulmonic valve: There was mild regurgitation. - Pulmonary arteries: PA peak pressure: 86 mm Hg (S).  Impressions: - The right ventricular systolic pressure was increased consistent with severe pulmonary hypertension.  Echocardiogram 01/22/2018 Study Conclusions  - Left ventricle: The cavity size was normal. Wall thickness was  increased in a pattern of moderate LVH. Systolic function was  normal. The estimated  ejection fraction was in the range of 60%  to 65%. Wall motion was normal; there were no regional wall  motion abnormalities.  - Ventricular septum: The contour showed diastolic flattening and  systolic flattening.  - Ascending aorta: The ascending aorta was mildly dilated.  - Mitral valve: Calcified annulus.  - Left atrium: The atrium was mildly dilated.  - Right ventricle: The cavity size was mildly dilated. Systolic  function was severely reduced.  - Right atrium: The atrium was mildly dilated.  - Atrial septum: The septum bowed from right to left, consistent  with increased right atrial pressure.  - Tricuspid valve: There was moderate regurgitation.  - Pulmonary arteries: Systolic pressure was severely increased. PA  peak pressure: 61 mm Hg (S).  - Pericardium, extracardiac: A trivial pericardial effusion was  identified.  Impressions:  - Normal LV systolic function; moderate LVH; D shaped septum;  mildly dilated ascending aorta; mild LAE and RAE; mild RVE with  severely reduced RV function; moderate TR with severe pulmonary  hypertension.    Assessment and Plan:   1. Chronic respiratory failure From mixed etiology of pulmonary HTN/cor pulmonale (normal LV fx, severely reduced RV fx and severe Tr), OSA/morbid obesity, COPD and possible diastolic dysfunction. Presents now with worsening shortness of breath and leg swelling. Admits to dietary non-compliance. States that he takes metolazone and torsemide daily.  BNP 458. Hs-Trops were 37 and 38. BUN 90 (previously 25), Cr 2.21 (baseline 1.3 to 1.4, three years ago), HGBA1c 8.7 CXR: Cardiomegaly with vascular congestion ECG: AFIB, rate RBBB, rate 78 bpm.  -Accurate I and Os -Daily weights -IV lasix in twice daily dosing is appropriate. If doesn't respond to 60 mg  then increase to 80 mg IV (q12h). -Discontinue metolazone - patient with unusual outpatient dosing (of taking it daily). BUN/Cr elevated on  admission -Hold valsartan -Compression wraps for legs  He needs to re-establish care with his pulmonary hypertension specialist since after inpatient diuresis most of his outpatient long-term care (including a right heart cath) will be dictated by that service. They can also assess his need for advanced PH therapies.   2.  Atrial fibrillation - rate controlled Inconsistent compliance with chronic anticoagulation in the past. The patient does not want to take Eliquis since he believes it is causing side effects.  - Can start warfarin. INR goal 2.0 to 2.5  He is rate controlled. No AV nodal blockers currently indicated.   For questions or updates, please contact [place] Please consult [email or web address] for contact info under    Signed, Meade Maw, MD  02/03/2021 9:13 AM

## 2021-02-03 NOTE — Progress Notes (Signed)
Placed patient's home unit at bedside and gave mask to patient.  O2 is running at 4L for patient.  Patient stated he would put himself on.

## 2021-02-03 NOTE — Progress Notes (Addendum)
ANTICOAGULATION CONSULT NOTE - Initial Consult  Pharmacy Consult for Lovenox and Warfarin Indication: atrial fibrillation  No Known Allergies  Patient Measurements: Height: _0  (175.3 cm) Weight: (!) 167 kg (368 lb 2.7 oz) IBW/kg (Calculated) : 70.7  Vital Signs: Temp: 98 F (36.7 C) (05/12 2051) Temp Source: Oral (05/12 2051) BP: 132/80 (05/12 2051) Pulse Rate: 75 (05/12 2051)  Labs: Recent Labs    02/02/21 1730 02/02/21 1944 02/03/21 0539 02/03/21 1954  HGB 11.4*  --  11.3*  --   HCT 38.3*  --  37.5*  --   PLT 315  --  294  --   LABPROT  --   --   --  16.2*  INR  --   --   --  1.3*  CREATININE 2.21*  --  1.91*  --   TROPONINIHS 37* 38*  --   --     Estimated Creatinine Clearance: 63.5 mL/min (A) (by C-G formula based on SCr of 1.91 mg/dL (H)).   Medical History: Past Medical History:  Diagnosis Date  . Asthma   . Atrial fibrillation ([place])   . CHF (congestive heart failure) ([place])   . COPD (chronic obstructive pulmonary disease) ([place])   . Diabetes mellitus without complication ([place])   . Hypertension   . Pulmonary hypertension (HCC)     Medications:  Medications Prior to Admission  Medication Sig Dispense Refill Last Dose  . acetaminophen (TYLENOL) 325 MG tablet Take 2 tablets (650 mg total) by mouth every 6 (six) hours as needed. 60 tablet 0   . acetaminophen (TYLENOL) 650 MG CR tablet Take 1,300 mg by mouth every 8 (eight) hours as needed for pain.   02/02/2021 at Unknown time  . Alcohol Swabs (B-D SINGLE USE SWABS REGULAR) PADS USE TWO TIMES DAILY 200 each 3   . cholecalciferol (VITAMIN D) 1000 units tablet Take 1 tablet (1,000 Units total) by mouth daily. 90 tablet 2 02/02/2021 at Unknown time  . ELIQUIS 5 MG TABS tablet TAKE 1 TABLET TWICE DAILY (Patient taking differently: Take 5 mg by mouth daily.) 60 tablet 0 02/01/2021 at 2030  . gabapentin (NEURONTIN) 100 MG capsule Take 2 capsules (251m) twice daily 360 capsule 0 02/02/2021 at Unknown time  .  ibuprofen (ADVIL) 800 MG tablet Take 800 mg by mouth every 8 (eight) hours as needed.   02/01/2021 at Unknown time  . metFORMIN (GLUCOPHAGE) 1000 MG tablet Take 1 tablet (1,000 mg total) by mouth daily. 90 tablet 3 02/02/2021 at Unknown time  . Multiple Vitamin (MULTIVITAMIN) tablet Take 1 tablet by mouth daily.   02/02/2021 at Unknown time  . OXYGEN Inhale 5 L into the lungs continuous.   02/02/2021 at Unknown time  . oxymetazoline (AFRIN) 0.05 % nasal spray 1 spray See admin instructions. 2 sprays into one nostril as needed for nasal congestion   02/01/2021 at Unknown time  . Potassium Citrate 99 MG CAPS Take 1 capsule by mouth daily.   02/02/2021 at Unknown time  . PRESCRIPTION MEDICATION Inhale into the lungs as needed (whenever sleeping - naps or at night). CPAP   Past Week at Unknown time  . torsemide (DEMADEX) 20 MG tablet TAKE 3 TABLETS EVERY DAY (Patient taking differently: Take 60 mg by mouth every evening.) 270 tablet 0 02/01/2021 at Unknown time  . ACCU-CHEK AVIVA PLUS test strip TEST BLOOD SUGAR TWICE DAILY 200 strip 1   . Accu-Chek Softclix Lancets lancets USE TO TEST TWO TIMES DAILY 200 each 3   .  Blood Glucose Monitoring Suppl (ACCU-CHEK AVIVA PLUS) w/Device KIT TEST TWO TIMES DAILY 1 kit 0   . cyclobenzaprine (FLEXERIL) 10 MG tablet Take 1 tablet (10 mg total) by mouth 3 (three) times daily as needed for muscle spasms. (Patient not taking: No sig reported) 30 tablet 0 Not Taking at Unknown time  . Elastic Bandages & Supports (WRIST SUPPORT/ELASTIC/FIRM LG) MISC      . ferrous sulfate 325 (65 FE) MG tablet Take 325 mg by mouth daily. (Patient not taking: No sig reported)   Not Taking at Unknown time  . KLOR-CON M20 20 MEQ tablet Take 40 mEq by mouth 2 (two) times daily. (Patient not taking: No sig reported)   Not Taking at Unknown time  . metolazone (ZAROXOLYN) 2.5 MG tablet Take 2.5 mg by mouth daily. (Patient not taking: No sig reported)   Not Taking at Unknown time  . potassium chloride  20 MEQ TBCR Take 40 mEq by mouth 2 (two) times daily for 2 doses. (Patient not taking: Reported on 02/02/2021) 4 tablet 0 Not Taking at Unknown time  . sildenafil (VIAGRA) 25 MG tablet Take 1 tablet (25 mg total) by mouth daily as needed for erectile dysfunction. (Patient not taking: No sig reported) 10 tablet 0   . valsartan (DIOVAN) 40 MG tablet Take 40 mg by mouth daily. (Patient not taking: No sig reported)   Not Taking at Unknown time    Assessment: 60 y.o. M presents with decompensated CHF. Pt was on Eliquis - dose PTA was 76m daily - Cards wants pt off of this medication but primary doctor has cut the dose from BID to once daily. Pt was ordered 542mBID on admission - last dose given 5/12 a.m. Pt would like to change to warfarin as he felt he had less issues with warfarin than Eliquis. Pharmacy consulted for Lovenox to warfarin bridge for afib. Baseline INR 1.3.  Goal of Therapy:  INR 2-3 Anti-Xa level 0.6-1 units/ml 4hrs after LMWH dose given Monitor platelets by anticoagulation protocol: Yes   Plan:  Lovenox 165 mg SQ q12h Consider anti-Xa level at Css in obese pt with SCr 1.91 Warfarin 1072mow  Daily PT/INR  CarSherlon HandingharmD, BCPS Please see amion for complete clinical pharmacist phone list 02/03/2021,10:26 PM

## 2021-02-04 DIAGNOSIS — I509 Heart failure, unspecified: Secondary | ICD-10-CM | POA: Diagnosis not present

## 2021-02-04 DIAGNOSIS — I5043 Acute on chronic combined systolic (congestive) and diastolic (congestive) heart failure: Secondary | ICD-10-CM | POA: Diagnosis not present

## 2021-02-04 DIAGNOSIS — I4891 Unspecified atrial fibrillation: Secondary | ICD-10-CM | POA: Diagnosis not present

## 2021-02-04 DIAGNOSIS — J9611 Chronic respiratory failure with hypoxia: Secondary | ICD-10-CM | POA: Diagnosis not present

## 2021-02-04 LAB — RENAL FUNCTION PANEL
Albumin: 3.3 g/dL — ABNORMAL LOW (ref 3.5–5.0)
Anion gap: 13 (ref 5–15)
BUN: 81 mg/dL — ABNORMAL HIGH (ref 6–20)
CO2: 34 mmol/L — ABNORMAL HIGH (ref 22–32)
Calcium: 9.3 mg/dL (ref 8.9–10.3)
Chloride: 90 mmol/L — ABNORMAL LOW (ref 98–111)
Creatinine, Ser: 1.71 mg/dL — ABNORMAL HIGH (ref 0.61–1.24)
GFR, Estimated: 45 mL/min — ABNORMAL LOW (ref >60)
Glucose, Bld: 164 mg/dL — ABNORMAL HIGH (ref 70–99)
Phosphorus: 3.4 mg/dL (ref 2.5–4.6)
Potassium: 3.4 mmol/L — ABNORMAL LOW (ref 3.5–5.1)
Sodium: 137 mmol/L (ref 135–145)

## 2021-02-04 LAB — GLUCOSE, CAPILLARY
Glucose-Capillary: 142 mg/dL — ABNORMAL HIGH (ref 70–99)
Glucose-Capillary: 190 mg/dL — ABNORMAL HIGH (ref 70–99)
Glucose-Capillary: 120 mg/dL — ABNORMAL HIGH (ref 70–99)
Glucose-Capillary: 184 mg/dL — ABNORMAL HIGH (ref 70–99)

## 2021-02-04 LAB — PROTIME-INR
INR: 1.5 — ABNORMAL HIGH (ref 0.8–1.2)
Prothrombin Time: 18.2 seconds — ABNORMAL HIGH (ref 11.4–15.2)

## 2021-02-04 LAB — BASIC METABOLIC PANEL
Anion gap: 13 (ref 5–15)
BUN: 84 mg/dL — ABNORMAL HIGH (ref 6–20)
CO2: 34 mmol/L — ABNORMAL HIGH (ref 22–32)
Calcium: 9.3 mg/dL (ref 8.9–10.3)
Chloride: 90 mmol/L — ABNORMAL LOW (ref 98–111)
Creatinine, Ser: 1.78 mg/dL — ABNORMAL HIGH (ref 0.61–1.24)
GFR, Estimated: 43 mL/min — ABNORMAL LOW (ref >60)
Glucose, Bld: 163 mg/dL — ABNORMAL HIGH (ref 70–99)
Potassium: 3.4 mmol/L — ABNORMAL LOW (ref 3.5–5.1)
Sodium: 137 mmol/L (ref 135–145)

## 2021-02-04 MED ORDER — GABAPENTIN 300 MG PO CAPS
300.0000 mg | ORAL_CAPSULE | Freq: Two times a day (BID) | ORAL | Status: DC
  Administered 2021-02-04 – 2021-02-06 (×4): 300 mg via ORAL
  Filled 2021-02-04 (×4): qty 1

## 2021-02-04 MED ORDER — OXYCODONE HCL 5 MG PO TABS
5.0000 mg | ORAL_TABLET | ORAL | Status: DC | PRN
  Administered 2021-02-04 – 2021-02-05 (×2): 5 mg via ORAL
  Administered 2021-02-06: 10 mg via ORAL
  Filled 2021-02-04 (×2): qty 1
  Filled 2021-02-04: qty 2

## 2021-02-04 MED ORDER — WARFARIN SODIUM 5 MG PO TABS
10.0000 mg | ORAL_TABLET | Freq: Once | ORAL | Status: AC
  Administered 2021-02-04: 10 mg via ORAL
  Filled 2021-02-04: qty 2

## 2021-02-04 MED ORDER — DICLOFENAC SODIUM 1 % EX GEL
4.0000 g | Freq: Four times a day (QID) | CUTANEOUS | Status: DC
  Administered 2021-02-04 – 2021-02-06 (×8): 4 g via TOPICAL
  Filled 2021-02-04: qty 100

## 2021-02-04 NOTE — Progress Notes (Signed)
PROGRESS NOTE    Roberto [name]  XTK:[id number] DOB: December 06, 1960 DOA: 02/02/2021 PCP: Alcus Dad, MD    Brief Narrative:  60-year-old male with a history of severe pulmonary hypertension, chronic right heart failure, primary atrial fibrillation, admitted to the hospital with decompensated CHF.  He is approximately 20 pounds above his dry weight.  He is admitted for IV diuresis.   Assessment & Plan:   Active Problems:   Atrial fibrillation with slow ventricular response (HCC)   Chronic respiratory failure with hypoxia (HCC)   Cor pulmonale, chronic (HCC)   Acute deep vein thrombosis (DVT) of femoral vein of left lower extremity (HCC)   GERD without esophagitis   CHF, acute on chronic (HCC)   Acute on chronic diastolic congestive heart failure Severe pulmonary hypertension Cor pulmonale -Patient reports that his dry weight is approximately 350 pounds -Admission weight noted to be 371 pounds which has now improved to 363 pounds -Currently on intravenous Lasix -Appreciate cardiology assistance -Metolazone has been discontinued for now -Once he is adequately diuresed, he will need follow-up but [facility] for right heart catheterization -He could potentially be placed on specific therapies for pulmonary hypertension -Continue compression wraps -Continue to monitor intake and output  Chronic respiratory failure with hypoxia -Chronically on 5 L of oxygen -Continue to monitor  Severe obstructive sleep apnea/obesity hypoventilation -Continue on nightly BiPAP  Chronic atrial fibrillation -Currently rate controlled -He is chronically on Eliquis for anticoagulation, but wishes to discontinue this in favor of Coumadin -He has been on Coumadin in the past and feels that he has had less bleeding episodes when on Coumadin.  He also attributes the development of nodules on his fingers to Eliquis -Per cardiology, okay to transition to Coumadin -Started on Coumadin per pharmacy  with Lovenox bridge  Chronic kidney disease stage IIIb -Creatinine currently at baseline at 1.7, improving with diuresis -Continue to monitor in the setting of diuresis -Hold ARB for now  Diabetes type 2 with neuropathy -Hold metformin -Continue on sliding insulin -Continue on gabapentin  Obesity, class III BMI 54.3 -Counseled on importance of diet and exercise -Exercise is somewhat limited due to severe right knee DJD as well as respiratory limitations   DVT prophylaxis: Lovenox/Coumadin  Code Status: Full code Family Communication: Discussed with patient Disposition Plan: Status is: Inpatient  Remains inpatient appropriate because:IV treatments appropriate due to intensity of illness or inability to take PO and Inpatient level of care appropriate due to severity of illness   Dispo: The patient is from: Home              Anticipated d/c is to: Home              Patient currently is not medically stable to d/c.   Difficult to place patient No   Consultants:   Cardiology  Procedures:     Antimicrobials:       Subjective: Feels her breathing is slowly improving.  Still has significant edema in lower extremities.  Complains of pain in his feet bilaterally which she attributes to neuropathy.  Objective: Vitals:   02/04/21 0534 02/04/21 0612 02/04/21 0735 02/04/21 1357  BP: (!) 98/51   92/65  Pulse: 73   83  Resp: 18   17  Temp: 98.4 F (36.9 C)   (!) 97.5 F (36.4 C)  TempSrc:    Oral  SpO2: 94%  92% 91%  Weight:  (!) 165.1 kg    Height:  Intake/Output Summary (Last 24 hours) at 02/04/2021 1713 Last data filed at 02/04/2021 1356 Gross per 24 hour  Intake 720 ml  Output 2800 ml  Net -2080 ml   Filed Weights   02/02/21 1722 02/03/21 0500 02/04/21 0612  Weight: (!) 168.3 kg (!) 167 kg (!) 165.1 kg    Examination:  General exam: Appears calm and comfortable  Respiratory system: bibasilar crackles. Respiratory effort normal. Cardiovascular  system: S1 & S2 heard, RRR. No JVD, murmurs, rubs, gallops or clicks. Gastrointestinal system: Abdomen is nondistended, soft and nontender. No organomegaly or masses felt. Normal bowel sounds heard. Central nervous system: Alert and oriented. No focal neurological deficits. Extremities: bilateral lower extremity edema with compression socks on Skin: No rashes, lesions or ulcers Psychiatry: Judgement and insight appear normal. Mood & affect appropriate.     Data Reviewed: I have personally reviewed following labs and imaging studies  CBC: Recent Labs  Lab 02/02/21 1730 02/03/21 0539  WBC 9.6 9.0  NEUTROABS 7.7 7.0  HGB 11.4* 11.3*  HCT 38.3* 37.5*  MCV 94.8 94.7  PLT 315 081   Basic Metabolic Panel: Recent Labs  Lab 02/02/21 1730 02/03/21 0539 02/04/21 0534  NA 135 137 137  137  K 3.6 3.1* 3.4*  3.4*  CL 93* 94* 90*  90*  CO2 27 34* 34*  34*  GLUCOSE 228* 173* 164*  163*  BUN 95* 90* 81*  84*  CREATININE 2.21* 1.91* 1.71*  1.78*  CALCIUM 8.9 9.4 9.3  9.3  MG 1.8 1.8  --   PHOS  --   --  3.4   GFR: Estimated Creatinine Clearance: 67.7 mL/min (A) (by C-G formula based on SCr of 1.78 mg/dL (H)). Liver Function Tests: Recent Labs  Lab 02/04/21 0534  ALBUMIN 3.3*   No results for input(s): LIPASE, AMYLASE in the last 168 hours. No results for input(s): AMMONIA in the last 168 hours. Coagulation Profile: Recent Labs  Lab 02/03/21 1954 02/04/21 0534  INR 1.3* 1.5*   Cardiac Enzymes: No results for input(s): CKTOTAL, CKMB, CKMBINDEX, TROPONINI in the last 168 hours. BNP (last 3 results) No results for input(s): PROBNP in the last 8760 hours. HbA1C: Recent Labs    02/02/21 2032  HGBA1C 8.7*   CBG: Recent Labs  Lab 02/03/21 1606 02/03/21 2128 02/04/21 0748 02/04/21 1130 02/04/21 1641  GLUCAP 238* 228* 142* 190* 120*   Lipid Profile: No results for input(s): CHOL, HDL, LDLCALC, TRIG, CHOLHDL, LDLDIRECT in the last 72 hours. Thyroid Function  Tests: No results for input(s): TSH, T4TOTAL, FREET4, T3FREE, THYROIDAB in the last 72 hours. Anemia Panel: No results for input(s): VITAMINB12, FOLATE, FERRITIN, TIBC, IRON, RETICCTPCT in the last 72 hours. Sepsis Labs: No results for input(s): PROCALCITON, LATICACIDVEN in the last 168 hours.  Recent Results (from the past 240 hour(s))  SARS CORONAVIRUS 2 (TAT 6-24 HRS) Nasopharyngeal Nasopharyngeal Swab     Status: None   Collection Time: 02/02/21  7:45 PM   Specimen: Nasopharyngeal Swab  Result Value Ref Range Status   SARS Coronavirus 2 NEGATIVE NEGATIVE Final    Comment: (NOTE) SARS-CoV-2 target nucleic acids are NOT DETECTED.  The SARS-CoV-2 RNA is generally detectable in upper and lower respiratory specimens during the acute phase of infection. Negative results do not preclude SARS-CoV-2 infection, do not rule out co-infections with other pathogens, and should not be used as the sole basis for treatment or other patient management decisions. Negative results must be combined with clinical observations, patient history, and  epidemiological information. The expected result is Negative.  Fact Sheet for Patients: SugarRoll.be  Fact Sheet for Healthcare Providers: https://www.woods-mathews.com/  This test is not yet approved or cleared by the [place] FDA and  has been authorized for detection and/or diagnosis of SARS-CoV-2 by FDA under an Emergency Use Authorization (EUA). This EUA will remain  in effect (meaning this test can be used) for the duration of the COVID-19 declaration under Se ction 564(b)(1) of the Act, 21 U.S.C. section 360bbb-3(b)(1), unless the authorization is terminated or revoked sooner.  Performed at [facility], [place] [address] [place], [place] [address]          Radiology Studies: DG Chest Portable 1 View  Result Date: 02/02/2021 CLINICAL DATA:  Short of breath EXAM: PORTABLE CHEST 1 VIEW  COMPARISON:  05/09/2020 FINDINGS: Cardiomegaly with vascular congestion. No pleural effusion, consolidation or pneumothorax IMPRESSION: Cardiomegaly with vascular congestion Electronically Signed   By: Donavan Foil M.D.   On: 02/02/2021 18:20        Scheduled Meds: . diclofenac Sodium  4 g Topical QID  . enoxaparin (LOVENOX) injection  165 mg Subcutaneous Q12H  . furosemide  60 mg Intravenous BID  . gabapentin  300 mg Oral BID  . insulin aspart  0-15 Units Subcutaneous TID WC  . insulin aspart  0-5 Units Subcutaneous QHS  . Warfarin - Pharmacist Dosing Inpatient   Does not apply q1600   Continuous Infusions:   LOS: 2 days    Time spent: 70mns    JKathie Dike MD [facility]   If 7PM-7AM, please contact night-coverage [email or web address]  02/04/2021, 5:13 PM

## 2021-02-04 NOTE — Care Management Important Message (Signed)
Important Message  Patient Details  Name: Roberto [name] MRN: [id number] Date of Birth: 06-14-1961   Medicare Important Message Given:  Yes     Tommy Medal 02/04/2021, 12:48 PM

## 2021-02-04 NOTE — Progress Notes (Addendum)
ANTICOAGULATION CONSULT NOTE - Initial Consult  Pharmacy Consult for Lovenox and Warfarin Indication: atrial fibrillation  No Known Allergies  Patient Measurements: Height: _0  (175.3 cm) Weight: (!) 165.1 kg (363 lb 15.7 oz) IBW/kg (Calculated) : 70.7  Vital Signs: Temp: 98.4 F (36.9 C) (05/13 0534) Temp Source: Oral (05/12 2051) BP: 98/51 (05/13 0534) Pulse Rate: 73 (05/13 0534)  Labs: Recent Labs    02/02/21 1730 02/02/21 1944 02/03/21 0539 02/03/21 1954 02/04/21 0534  HGB 11.4*  --  11.3*  --   --   HCT 38.3*  --  37.5*  --   --   PLT 315  --  294  --   --   LABPROT  --   --   --  16.2* 18.2*  INR  --   --   --  1.3* 1.5*  CREATININE 2.21*  --  1.91*  --  1.71*  1.78*  TROPONINIHS 37* 38*  --   --   --     Estimated Creatinine Clearance: 67.7 mL/min (A) (by C-G formula based on SCr of 1.78 mg/dL (H)).   Medical History: Past Medical History:  Diagnosis Date  . Asthma   . Atrial fibrillation ([place])   . CHF (congestive heart failure) ([place])   . COPD (chronic obstructive pulmonary disease) ([place])   . Diabetes mellitus without complication ([place])   . Hypertension   . Pulmonary hypertension (HCC)     Medications:  Medications Prior to Admission  Medication Sig Dispense Refill Last Dose  . acetaminophen (TYLENOL) 325 MG tablet Take 2 tablets (650 mg total) by mouth every 6 (six) hours as needed. 60 tablet 0   . acetaminophen (TYLENOL) 650 MG CR tablet Take 1,300 mg by mouth every 8 (eight) hours as needed for pain.   02/02/2021 at Unknown time  . Alcohol Swabs (B-D SINGLE USE SWABS REGULAR) PADS USE TWO TIMES DAILY 200 each 3   . cholecalciferol (VITAMIN D) 1000 units tablet Take 1 tablet (1,000 Units total) by mouth daily. 90 tablet 2 02/02/2021 at Unknown time  . ELIQUIS 5 MG TABS tablet TAKE 1 TABLET TWICE DAILY (Patient taking differently: Take 5 mg by mouth daily.) 60 tablet 0 02/01/2021 at 2030  . gabapentin (NEURONTIN) 100 MG capsule Take 2 capsules  (271m) twice daily 360 capsule 0 02/02/2021 at Unknown time  . ibuprofen (ADVIL) 800 MG tablet Take 800 mg by mouth every 8 (eight) hours as needed.   02/01/2021 at Unknown time  . metFORMIN (GLUCOPHAGE) 1000 MG tablet Take 1 tablet (1,000 mg total) by mouth daily. 90 tablet 3 02/02/2021 at Unknown time  . Multiple Vitamin (MULTIVITAMIN) tablet Take 1 tablet by mouth daily.   02/02/2021 at Unknown time  . OXYGEN Inhale 5 L into the lungs continuous.   02/02/2021 at Unknown time  . oxymetazoline (AFRIN) 0.05 % nasal spray 1 spray See admin instructions. 2 sprays into one nostril as needed for nasal congestion   02/01/2021 at Unknown time  . Potassium Citrate 99 MG CAPS Take 1 capsule by mouth daily.   02/02/2021 at Unknown time  . PRESCRIPTION MEDICATION Inhale into the lungs as needed (whenever sleeping - naps or at night). CPAP   Past Week at Unknown time  . torsemide (DEMADEX) 20 MG tablet TAKE 3 TABLETS EVERY DAY (Patient taking differently: Take 60 mg by mouth every evening.) 270 tablet 0 02/01/2021 at Unknown time  . ACCU-CHEK AVIVA PLUS test strip TEST BLOOD SUGAR TWICE DAILY 200  strip 1   . Accu-Chek Softclix Lancets lancets USE TO TEST TWO TIMES DAILY 200 each 3   . Blood Glucose Monitoring Suppl (ACCU-CHEK AVIVA PLUS) w/Device KIT TEST TWO TIMES DAILY 1 kit 0   . cyclobenzaprine (FLEXERIL) 10 MG tablet Take 1 tablet (10 mg total) by mouth 3 (three) times daily as needed for muscle spasms. (Patient not taking: No sig reported) 30 tablet 0 Not Taking at Unknown time  . Elastic Bandages & Supports (WRIST SUPPORT/ELASTIC/FIRM LG) MISC      . ferrous sulfate 325 (65 FE) MG tablet Take 325 mg by mouth daily. (Patient not taking: No sig reported)   Not Taking at Unknown time  . KLOR-CON M20 20 MEQ tablet Take 40 mEq by mouth 2 (two) times daily. (Patient not taking: No sig reported)   Not Taking at Unknown time  . metolazone (ZAROXOLYN) 2.5 MG tablet Take 2.5 mg by mouth daily. (Patient not taking: No sig  reported)   Not Taking at Unknown time  . potassium chloride 20 MEQ TBCR Take 40 mEq by mouth 2 (two) times daily for 2 doses. (Patient not taking: Reported on 02/02/2021) 4 tablet 0 Not Taking at Unknown time  . sildenafil (VIAGRA) 25 MG tablet Take 1 tablet (25 mg total) by mouth daily as needed for erectile dysfunction. (Patient not taking: No sig reported) 10 tablet 0   . valsartan (DIOVAN) 40 MG tablet Take 40 mg by mouth daily. (Patient not taking: No sig reported)   Not Taking at Unknown time    Assessment: 60 y.o. M presents with decompensated CHF. Pt was on Eliquis - dose PTA was 20m daily - Cards wants pt off of this medication but primary doctor has cut the dose from BID to once daily. Pt was ordered 560mBID on admission - last dose given 5/12 a.m. Pt would like to change to warfarin as he felt he had less issues with warfarin than Eliquis. Pharmacy consulted for Lovenox to warfarin bridge for afib. Baseline INR 1.3.  5/13 INR 1.5, subtherapeutic   Goal of Therapy:  INR 2-3 Anti-Xa level 0.6-1 units/ml 4hrs after LMWH dose given Monitor platelets by anticoagulation protocol: Yes   Plan:  Lovenox 165 mg SQ q12h Consider anti-Xa level at Css in obese pt with SCr 1.91 Warfarin 1022m1  Daily PT/INR  GarDonna Christenffee, PharmD, MBA, BCGP Clinical Pharmacist  Please see amion for complete clinical pharmacist phone list 02/04/2021,7:35 AM

## 2021-02-04 NOTE — Progress Notes (Signed)
Made sure patient's home unit was hooked up to his 4L O2 and that his machine was at bedside for patient.  Patient places hisself on his own machine.

## 2021-02-05 DIAGNOSIS — I509 Heart failure, unspecified: Secondary | ICD-10-CM | POA: Diagnosis not present

## 2021-02-05 DIAGNOSIS — I2781 Cor pulmonale (chronic): Secondary | ICD-10-CM | POA: Diagnosis not present

## 2021-02-05 DIAGNOSIS — I4891 Unspecified atrial fibrillation: Secondary | ICD-10-CM | POA: Diagnosis not present

## 2021-02-05 DIAGNOSIS — J9611 Chronic respiratory failure with hypoxia: Secondary | ICD-10-CM | POA: Diagnosis not present

## 2021-02-05 DIAGNOSIS — Z6841 Body Mass Index (BMI) 40.0 and over, adult: Secondary | ICD-10-CM

## 2021-02-05 LAB — GLUCOSE, CAPILLARY
Glucose-Capillary: 167 mg/dL — ABNORMAL HIGH (ref 70–99)
Glucose-Capillary: 180 mg/dL — ABNORMAL HIGH (ref 70–99)
Glucose-Capillary: 145 mg/dL — ABNORMAL HIGH (ref 70–99)
Glucose-Capillary: 137 mg/dL — ABNORMAL HIGH (ref 70–99)

## 2021-02-05 LAB — BASIC METABOLIC PANEL
Anion gap: 16 — ABNORMAL HIGH (ref 5–15)
BUN: 81 mg/dL — ABNORMAL HIGH (ref 6–20)
CO2: 33 mmol/L — ABNORMAL HIGH (ref 22–32)
Calcium: 9.5 mg/dL (ref 8.9–10.3)
Chloride: 89 mmol/L — ABNORMAL LOW (ref 98–111)
Creatinine, Ser: 1.87 mg/dL — ABNORMAL HIGH (ref 0.61–1.24)
GFR, Estimated: 41 mL/min — ABNORMAL LOW (ref >60)
Glucose, Bld: 171 mg/dL — ABNORMAL HIGH (ref 70–99)
Potassium: 3.6 mmol/L (ref 3.5–5.1)
Sodium: 138 mmol/L (ref 135–145)

## 2021-02-05 LAB — PROTIME-INR
INR: 1.6 — ABNORMAL HIGH (ref 0.8–1.2)
Prothrombin Time: 18.7 seconds — ABNORMAL HIGH (ref 11.4–15.2)

## 2021-02-05 MED ORDER — OXYMETAZOLINE HCL 0.05 % NA SOLN
1.0000 | Freq: Two times a day (BID) | NASAL | Status: DC | PRN
  Administered 2021-02-06: 1 via NASAL
  Filled 2021-02-05: qty 30

## 2021-02-05 MED ORDER — WARFARIN SODIUM 5 MG PO TABS
12.0000 mg | ORAL_TABLET | Freq: Once | ORAL | Status: AC
  Administered 2021-02-05: 12 mg via ORAL
  Filled 2021-02-05: qty 2

## 2021-02-05 NOTE — Progress Notes (Signed)
Patient stating that he is ready to leave without the doctor's order. Patient educated on the need to stay to receive the proper care at this time. Patient stated he is trying to find someone to take him home. Dr. Dyann Kief made aware.

## 2021-02-05 NOTE — Progress Notes (Signed)
PROGRESS NOTE    Roberto [name]  JXB:[id number] DOB: 1961/05/23 DOA: 02/02/2021 PCP: Alcus Dad, MD    Brief Narrative:  60-year-old male with a history of severe pulmonary hypertension, chronic right heart failure, primary atrial fibrillation, admitted to the hospital with decompensated CHF.  He is approximately 20 pounds above his dry weight.  He is admitted for IV diuresis.   Assessment & Plan:   Active Problems:   Atrial fibrillation with slow ventricular response (HCC)   Chronic respiratory failure with hypoxia (HCC)   Cor pulmonale, chronic (HCC)   Acute deep vein thrombosis (DVT) of femoral vein of left lower extremity (HCC)   GERD without esophagitis   CHF, acute on chronic (HCC)   Acute on chronic diastolic congestive heart failure Severe pulmonary hypertension Cor pulmonale -Patient reports that his dry weight is approximately 350 pounds -Admission weight noted to be 371 pounds which has now improved to 357 pounds -Will continue with IV diuresis -Appreciate cardiology assistance -Metolazone has been discontinued for now -Once he is adequately diuresed, he will need follow-up but [facility] for right heart catheterization and further management/therapies for pulmonary hypertension -Continue compression wraps -Continue to monitor intake and output along with daily weights and low-sodium diet.  Chronic respiratory failure with hypoxia/severe OSA and obesity hypoventilation syndrome. -Chronically on 5 L of oxygen -Continue to monitor saturation -continue BIPAP QHS  Chronic atrial fibrillation -Currently rate controlled -He was chronically on Eliquis for anticoagulation, but wishes to discontinue this in favor of Coumadin -He has been on Coumadin in the past and feels that he has had less bleeding episodes when on Coumadin.  He also attributes the development of nodules on his fingers to Eliquis -Per cardiology, okay to transition to Coumadin -Started on  Coumadin per pharmacy with Lovenox bridge -INR 1.6 currently.  Chronic kidney disease stage IIIb -Creatinine currently at baseline at 1.7--1.8 -peaked at 2.21 improving with diuresis -Continue to monitor in the setting of diuresis -Hold ARB for now  Diabetes type 2 with neuropathy -Continue holding metformin while inpatient. -Continue the use of sliding scale insulin -Follow CBGs and adjust hypoglycemic regimen. -Continue the use of gabapentin   Obesity, class III -BMI 54.3 -Low calorie diet and portion control discussed with patient.   DVT prophylaxis: Lovenox/Coumadin  Code Status: Full code Family Communication: Discussed with patient Disposition Plan: Status is: Inpatient   Dispo: The patient is from: Home              Anticipated d/c is to: Home              Patient currently is not medically stable to d/c.  INR still subtherapeutic receiving bridging therapy.  Still with signs of fluid overload, orthopnea and difficulty speaking in full sentences.   Difficult to place patient No   Consultants:   Cardiology  Procedures:   See below clinic for report.  Antimicrobials:    none    Subjective: Patient reports breathing is better today; denies chest pain, no nausea, no vomiting.  Still with significant treatment note: Still having difficulty speaking full sentences and is short of breath with activity.  Positive orthopnea base of the decree elevation head of bed that his bed has to be able to sleep.  Objective: Vitals:   02/04/21 1357 02/04/21 2233 02/05/21 0506 02/05/21 1346  BP: 92/65 131/72 (!) 128/103 (!) 136/121  Pulse: 83 90 91 100  Resp: _0 Temp: (!) 97.5 F (36.4 C) 99.3  F (37.4 C) 99.5 F (37.5 C) 99 F (37.2 C)  TempSrc: Oral Oral Oral Oral  SpO2: 91% 93% (!) 86% 99%  Weight:   (!) 161.5 kg   Height:        Intake/Output Summary (Last 24 hours) at 02/05/2021 1727 Last data filed at 02/05/2021 1638 Gross per 24 hour  Intake 840 ml   Output 2000 ml  Net -1160 ml   Filed Weights   02/03/21 0500 02/04/21 0612 02/05/21 0506  Weight: (!) 167 kg (!) 165.1 kg (!) 161.5 kg    Examination: General exam: Alert, awake, oriented x 3; reports breathing is improved and is demanding to go home.  He denies chest pain.  Still short of breath while trying to complete full sentences.  Requiring bed to be partially elevated secondary to presence of orthopnea and on physical exam there is signs of fluid overload. Respiratory system: Decreased breath sounds at the bases; no frank crackles, no using accessory muscles.  Fair movement bilaterally. Cardiovascular system: RRR. No murmurs, rubs or gallops.  Unable to properly assess JVD with body habitus. Gastrointestinal system: Abdomen is obese, soft, nondistended, and with positive bowel sounds.   Central nervous system: Alert and oriented. No focal neurological deficits. Extremities: No cyanosis or clubbing.  2-3+ edema appreciated bilaterally. Skin: No petechiae.  Chronic excessive dermatitis appreciated on both lower extremities. Psychiatry: Judgement and insight appear normal. Mood & affect appropriate.    Data Reviewed: I have personally reviewed following labs and imaging studies  CBC: Recent Labs  Lab 02/02/21 1730 02/03/21 0539  WBC 9.6 9.0  NEUTROABS 7.7 7.0  HGB 11.4* 11.3*  HCT 38.3* 37.5*  MCV 94.8 94.7  PLT 315 326   Basic Metabolic Panel: Recent Labs  Lab 02/02/21 1730 02/03/21 0539 02/04/21 0534 02/05/21 0429  NA 135 137 137  137 138  K 3.6 3.1* 3.4*  3.4* 3.6  CL 93* 94* 90*  90* 89*  CO2 27 34* 34*  34* 33*  GLUCOSE 228* 173* 164*  163* 171*  BUN 95* 90* 81*  84* 81*  CREATININE 2.21* 1.91* 1.71*  1.78* 1.87*  CALCIUM 8.9 9.4 9.3  9.3 9.5  MG 1.8 1.8  --   --   PHOS  --   --  3.4  --    GFR: Estimated Creatinine Clearance: 63.6 mL/min (A) (by C-G formula based on SCr of 1.87 mg/dL (H)).   Liver Function Tests: Recent Labs  Lab  02/04/21 0534  ALBUMIN 3.3*   Coagulation Profile: Recent Labs  Lab 02/03/21 1954 02/04/21 0534 02/05/21 0429  INR 1.3* 1.5* 1.6*   HbA1C: Recent Labs    02/02/21 2032  HGBA1C 8.7*   CBG: Recent Labs  Lab 02/04/21 1641 02/04/21 2229 02/05/21 0737 02/05/21 1140 02/05/21 1632  GLUCAP 120* 184* 167* 180* 145*    Recent Results (from the past 240 hour(s))  SARS CORONAVIRUS 2 (TAT 6-24 HRS) Nasopharyngeal Nasopharyngeal Swab     Status: None   Collection Time: 02/02/21  7:45 PM   Specimen: Nasopharyngeal Swab  Result Value Ref Range Status   SARS Coronavirus 2 NEGATIVE NEGATIVE Final    Comment: (NOTE) SARS-CoV-2 target nucleic acids are NOT DETECTED.  The SARS-CoV-2 RNA is generally detectable in upper and lower respiratory specimens during the acute phase of infection. Negative results do not preclude SARS-CoV-2 infection, do not rule out co-infections with other pathogens, and should not be used as the sole basis for treatment or other patient  management decisions. Negative results must be combined with clinical observations, patient history, and epidemiological information. The expected result is Negative.  Fact Sheet for Patients: SugarRoll.be  Fact Sheet for Healthcare Providers: https://www.woods-mathews.com/  This test is not yet approved or cleared by the [place] FDA and  has been authorized for detection and/or diagnosis of SARS-CoV-2 by FDA under an Emergency Use Authorization (EUA). This EUA will remain  in effect (meaning this test can be used) for the duration of the COVID-19 declaration under Se ction 564(b)(1) of the Act, 21 U.S.C. section 360bbb-3(b)(1), unless the authorization is terminated or revoked sooner.  Performed at [facility], [place] [address] [place], [place] [address]      Radiology Studies: No results found.   Scheduled Meds: . diclofenac Sodium  4 g Topical QID  .  enoxaparin (LOVENOX) injection  165 mg Subcutaneous Q12H  . furosemide  60 mg Intravenous BID  . gabapentin  300 mg Oral BID  . insulin aspart  0-15 Units Subcutaneous TID WC  . insulin aspart  0-5 Units Subcutaneous QHS  . Warfarin - Pharmacist Dosing Inpatient   Does not apply q1600   Continuous Infusions:   LOS: 3 days    Time spent: 35 mins   Barton Dubois, MD [phone] [facility]   If 7PM-7AM, please contact night-coverage [email or web address]  02/05/2021, 5:27 PM

## 2021-02-05 NOTE — Progress Notes (Signed)
ANTICOAGULATION CONSULT NOTE - Initial Consult  Pharmacy Consult for Lovenox and Warfarin Indication: atrial fibrillation  No Known Allergies  Patient Measurements: Height: _0  (175.3 cm) Weight: (!) 161.5 kg (356 lb 0.7 oz) IBW/kg (Calculated) : 70.7  Vital Signs: Temp: 99.5 F (37.5 C) (05/14 0506) Temp Source: Oral (05/14 0506) BP: 128/103 (05/14 0506) Pulse Rate: 91 (05/14 0506)  Labs: Recent Labs    02/02/21 1730 02/02/21 1944 02/03/21 0539 02/03/21 1954 02/04/21 0534 02/05/21 0429  HGB 11.4*  --  11.3*  --   --   --   HCT 38.3*  --  37.5*  --   --   --   PLT 315  --  294  --   --   --   LABPROT  --   --   --  16.2* 18.2* 18.7*  INR  --   --   --  1.3* 1.5* 1.6*  CREATININE 2.21*  --  1.91*  --  1.71*  1.78* 1.87*  TROPONINIHS 37* 38*  --   --   --   --     Estimated Creatinine Clearance: 63.6 mL/min (A) (by C-G formula based on SCr of 1.87 mg/dL (H)).   Medical History: Past Medical History:  Diagnosis Date  . Asthma   . Atrial fibrillation ([place])   . CHF (congestive heart failure) ([place])   . COPD (chronic obstructive pulmonary disease) ([place])   . Diabetes mellitus without complication ([place])   . Hypertension   . Pulmonary hypertension (HCC)     Medications:  Medications Prior to Admission  Medication Sig Dispense Refill Last Dose  . acetaminophen (TYLENOL) 325 MG tablet Take 2 tablets (650 mg total) by mouth every 6 (six) hours as needed. 60 tablet 0   . acetaminophen (TYLENOL) 650 MG CR tablet Take 1,300 mg by mouth every 8 (eight) hours as needed for pain.   02/02/2021 at Unknown time  . Alcohol Swabs (B-D SINGLE USE SWABS REGULAR) PADS USE TWO TIMES DAILY 200 each 3   . cholecalciferol (VITAMIN D) 1000 units tablet Take 1 tablet (1,000 Units total) by mouth daily. 90 tablet 2 02/02/2021 at Unknown time  . ELIQUIS 5 MG TABS tablet TAKE 1 TABLET TWICE DAILY (Patient taking differently: Take 5 mg by mouth daily.) 60 tablet 0 02/01/2021 at 2030  .  gabapentin (NEURONTIN) 100 MG capsule Take 2 capsules (230m) twice daily 360 capsule 0 02/02/2021 at Unknown time  . ibuprofen (ADVIL) 800 MG tablet Take 800 mg by mouth every 8 (eight) hours as needed.   02/01/2021 at Unknown time  . metFORMIN (GLUCOPHAGE) 1000 MG tablet Take 1 tablet (1,000 mg total) by mouth daily. 90 tablet 3 02/02/2021 at Unknown time  . Multiple Vitamin (MULTIVITAMIN) tablet Take 1 tablet by mouth daily.   02/02/2021 at Unknown time  . OXYGEN Inhale 5 L into the lungs continuous.   02/02/2021 at Unknown time  . oxymetazoline (AFRIN) 0.05 % nasal spray 1 spray See admin instructions. 2 sprays into one nostril as needed for nasal congestion   02/01/2021 at Unknown time  . Potassium Citrate 99 MG CAPS Take 1 capsule by mouth daily.   02/02/2021 at Unknown time  . PRESCRIPTION MEDICATION Inhale into the lungs as needed (whenever sleeping - naps or at night). CPAP   Past Week at Unknown time  . torsemide (DEMADEX) 20 MG tablet TAKE 3 TABLETS EVERY DAY (Patient taking differently: Take 60 mg by mouth every evening.) 270 tablet 0  02/01/2021 at Unknown time  . ACCU-CHEK AVIVA PLUS test strip TEST BLOOD SUGAR TWICE DAILY 200 strip 1   . Accu-Chek Softclix Lancets lancets USE TO TEST TWO TIMES DAILY 200 each 3   . Blood Glucose Monitoring Suppl (ACCU-CHEK AVIVA PLUS) w/Device KIT TEST TWO TIMES DAILY 1 kit 0   . cyclobenzaprine (FLEXERIL) 10 MG tablet Take 1 tablet (10 mg total) by mouth 3 (three) times daily as needed for muscle spasms. (Patient not taking: No sig reported) 30 tablet 0 Not Taking at Unknown time  . Elastic Bandages & Supports (WRIST SUPPORT/ELASTIC/FIRM LG) MISC      . ferrous sulfate 325 (65 FE) MG tablet Take 325 mg by mouth daily. (Patient not taking: No sig reported)   Not Taking at Unknown time  . KLOR-CON M20 20 MEQ tablet Take 40 mEq by mouth 2 (two) times daily. (Patient not taking: No sig reported)   Not Taking at Unknown time  . metolazone (ZAROXOLYN) 2.5 MG tablet  Take 2.5 mg by mouth daily. (Patient not taking: No sig reported)   Not Taking at Unknown time  . potassium chloride 20 MEQ TBCR Take 40 mEq by mouth 2 (two) times daily for 2 doses. (Patient not taking: Reported on 02/02/2021) 4 tablet 0 Not Taking at Unknown time  . sildenafil (VIAGRA) 25 MG tablet Take 1 tablet (25 mg total) by mouth daily as needed for erectile dysfunction. (Patient not taking: No sig reported) 10 tablet 0   . valsartan (DIOVAN) 40 MG tablet Take 40 mg by mouth daily. (Patient not taking: No sig reported)   Not Taking at Unknown time    Assessment: 60 y.o. M presents with decompensated CHF. Pt was on Eliquis - dose PTA was 40m daily - Cards wants pt off of this medication but primary doctor has cut the dose from BID to once daily. Pt was ordered 527mBID on admission - last dose given 5/12 a.m. Pt would like to change to warfarin as he felt he had less issues with warfarin than Eliquis. Pharmacy consulted for Lovenox to warfarin bridge for afib. Baseline INR 1.3.  5/13 INR 1.5, subtherapeutic 1057miven 5/14 INR 1.6, subtherapeutic   Goal of Therapy:  INR 2-3 Anti-Xa level 0.6-1 units/ml 4hrs after LMWH dose given Monitor platelets by anticoagulation protocol: Yes   Plan:  Lovenox 165 mg SQ q12h Warfarin 60m54m  Daily PT/INR  GarrThomasenia SalesarmD, MBA, BCGP Clinical Pharmacist  Please see [email or web address] for complete clinical pharmacist phone list 02/05/2021,8:26 AM

## 2021-02-05 NOTE — Plan of Care (Signed)
  Problem: Education: Goal: Knowledge of General Education information will improve Description: Including pain rating scale, medication(s)/side effects and non-pharmacologic comfort measures Outcome: Progressing   Problem: Health Behavior/Discharge Planning: Goal: Ability to manage health-related needs will improve Outcome: Progressing

## 2021-02-06 DIAGNOSIS — K219 Gastro-esophageal reflux disease without esophagitis: Secondary | ICD-10-CM

## 2021-02-06 DIAGNOSIS — E1142 Type 2 diabetes mellitus with diabetic polyneuropathy: Secondary | ICD-10-CM

## 2021-02-06 DIAGNOSIS — J9611 Chronic respiratory failure with hypoxia: Secondary | ICD-10-CM | POA: Diagnosis not present

## 2021-02-06 DIAGNOSIS — I5043 Acute on chronic combined systolic (congestive) and diastolic (congestive) heart failure: Secondary | ICD-10-CM | POA: Diagnosis not present

## 2021-02-06 DIAGNOSIS — I2781 Cor pulmonale (chronic): Secondary | ICD-10-CM | POA: Diagnosis not present

## 2021-02-06 DIAGNOSIS — I4891 Unspecified atrial fibrillation: Secondary | ICD-10-CM | POA: Diagnosis not present

## 2021-02-06 LAB — BASIC METABOLIC PANEL
Anion gap: 15 (ref 5–15)
BUN: 84 mg/dL — ABNORMAL HIGH (ref 6–20)
CO2: 34 mmol/L — ABNORMAL HIGH (ref 22–32)
Calcium: 9.2 mg/dL (ref 8.9–10.3)
Chloride: 88 mmol/L — ABNORMAL LOW (ref 98–111)
Creatinine, Ser: 1.95 mg/dL — ABNORMAL HIGH (ref 0.61–1.24)
GFR, Estimated: 39 mL/min — ABNORMAL LOW (ref >60)
Glucose, Bld: 144 mg/dL — ABNORMAL HIGH (ref 70–99)
Potassium: 3.6 mmol/L (ref 3.5–5.1)
Sodium: 137 mmol/L (ref 135–145)

## 2021-02-06 LAB — GLUCOSE, CAPILLARY
Glucose-Capillary: 154 mg/dL — ABNORMAL HIGH (ref 70–99)
Glucose-Capillary: 148 mg/dL — ABNORMAL HIGH (ref 70–99)
Glucose-Capillary: 165 mg/dL — ABNORMAL HIGH (ref 70–99)

## 2021-02-06 LAB — PROTIME-INR
INR: 1.8 — ABNORMAL HIGH (ref 0.8–1.2)
Prothrombin Time: 20.6 seconds — ABNORMAL HIGH (ref 11.4–15.2)

## 2021-02-06 MED ORDER — ENOXAPARIN SODIUM 120 MG/0.8ML IJ SOSY: 240.0000 mg | PREFILLED_SYRINGE | INTRAMUSCULAR | 0 refills | Status: DC

## 2021-02-06 MED ORDER — TORSEMIDE 40 MG PO TABS: 80.0000 mg | ORAL_TABLET | Freq: Every day | ORAL | 3 refills | Status: DC

## 2021-02-06 MED ORDER — DICLOFENAC SODIUM 1 % EX GEL: 4.0000 g | Freq: Four times a day (QID) | CUTANEOUS | 1 refills | Status: AC | PRN

## 2021-02-06 MED ORDER — WARFARIN SODIUM 5 MG PO TABS
12.0000 mg | ORAL_TABLET | Freq: Once | ORAL | Status: AC
  Administered 2021-02-06: 12 mg via ORAL
  Filled 2021-02-06: qty 2

## 2021-02-06 MED ORDER — WARFARIN SODIUM 2.5 MG PO TABS: ORAL_TABLET | ORAL | 2 refills | Status: DC

## 2021-02-06 NOTE — Discharge Summary (Addendum)
Physician Discharge Summary  Roberto [name] ZOX:[id number] DOB: [birth year] DOA: 02/02/2021  PCP: Roberto Dad, MD  Admit date: 02/02/2021 Discharge date: 02/06/2021  Time spent: 35 minutes  Recommendations for Outpatient Follow-up:  1. Patient will benefit of goals of care and advance care planning discussion 2. -Repeat basic metabolic panel to follow electrolytes and renal function 3. Continue to follow CBGs with further adjustment to hypoglycemia regimen as required 4. -Continue assisting patient with weight loss management; he will benefit of referral to bariatric clinic for further treatment.   Discharge Diagnoses:  Active Problems:   Atrial fibrillation with slow ventricular response (HCC)   Chronic respiratory failure with hypoxia (HCC)   Cor pulmonale, chronic (HCC)   Acute deep vein thrombosis (DVT) of femoral vein of left lower extremity (HCC)   Morbid obesity with body mass index of 50.0-59.9 in adult [facility])   GERD without esophagitis   CHF, acute on chronic [facility])   Discharge Condition: Stable and improved.  Discharged home with instruction to follow-up with PCP in 1 week and to follow-up with cardiology service in approximately 10 days.  -CODE STATUS: Full code.  Diet recommendation: Heart healthy, low calorie and modify carbohydrate diet.  Filed Weights   02/04/21 0612 02/05/21 0506 02/05/21 1700  Weight: (!) 165.1 kg (!) 161.5 kg (!) 162.2 kg    History of present illness:  As per H&P written by Dr. Waldron [name] on 02/02/2021 Roberto [name]  is a 60 y.o. male, with  past medical history of essential hypertension, morbid obesity and severe obstructive sleep apnea, severe pulmonary hypertension, chronic right heart failure/cor pulmonale, permanent atrial fibrillation chronic diastolic heart failure as well as diabetes mellitus, chronic respiratory failure on 5 L nasal cannula secondary to pulmonary hypertension. -Patient presents to ED secondary to worsening dyspnea,  patient reports worsening dyspnea for last few days, mainly orthopnea, reports he has been sitting up for last few days, he reports 19 pound weight gain (ideal weight 251, reports he was 272 recently at his PCP office,), he is on 5 L nasal cannula currently 100% on 6 L nasal cannula, and reports he has not been compliant with salt restriction for last couple weeks, but he has been otherwise been compliant with his medications, as well report he has been intermittently noncompliant with his Eliquis, but he has been managed with diuresis otherwise. -In ED he was noted to have significant lower extremity edema, chest x-ray with vascular congestion, fattening 2.2, BNP of 458, [facility] hospitalist consulted to admit.  Hospital Course:  Acute on chronic diastolic congestive heart failure Severe pulmonary hypertension Cor pulmonale -Patient reports that his dry weight is approximately 350 pounds -Admission weight noted to be 371 pounds which has now improved to 356 pounds at discharge. -Patient's Demadex dose has been adjusted to continue assisting with volume management. -Patient advised to check his weight on daily basis, to follow low-sodium diet and to maintain adequate hydration. -Appreciate cardiology assistance -Patient will need follow-up but [facility] for right heart catheterization and further management/therapies for pulmonary hypertension -Continue compression wraps as part of treatment for lymphedema.  Chronic respiratory failure with hypoxia/severe OSA and obesity hypoventilation syndrome. -Chronically on 5 L of oxygen -Continue to monitor saturation -continue BIPAP QHS  Chronic atrial fibrillation -Currently rate controlled -He was chronically on Eliquis for anticoagulation, but wishes to discontinue this in favor of Coumadin -He has been on Coumadin in the past and feels that he has had less bleeding episodes when on Coumadin.  He  also attributes the development of nodules on  his fingers to Eliquis -Per cardiology, okay to transition to Coumadin -Started on Coumadin per pharmacy with Lovenox bridge; home health services has been arranged to have patient's Coumadin level check and information sent to PCP to further adjust Coumadin level.  Following pharmacy recommendation patient will be alternated Coumadin dose between 10 and 12 mg every other day.  Once a day Lovenox shot has been prescribed with anticipation of dual treatment until Coumadin therapeutic for 24 hours. -INR 1.8 currently.  Chronic kidney disease stage IIIb -Creatinine currently at baseline at 1.7--1.8 -peaked at 2.21 improving with diuresis -Continue to monitor in the setting of diuresis -Repeat basic metabolic panel at follow-up visit to assess renal function trend/stability.  Diabetes type 2 with neuropathy -Patient advised to follow modified carbohydrate diet -Resume the use of metformin as previously instructed -Continue the use of gabapentin -Patient advised to follow-up with PCP to follow CBGs fluctuation and further adjust hypoglycemic regimen as required.  Obesity, class III -BMI 54.3 -Low calorie diet and portion control discussed with patient.  Procedures:  See below for x-ray reports  Consultations:  Cardiology service  Discharge Exam: Vitals:   02/06/21 0537 02/06/21 1344  BP: (!) 137/100 138/88  Pulse: 81 94  Resp: 20 17  Temp: 97.7 F (36.5 C) 98.4 F (36.9 C)  SpO2: 93% 90%    General: Patient reports breathing close to baseline; asking to be discharged home otherwise he will sign out [place].  No chest pain, no nausea, no vomiting, no fever.  During my evaluation he was laying completely flat in bed and expressed no orthopnea symptoms.  Able to speak in full sentences throughout evaluation. Cardiovascular: Irregular, rate controlled; no rubs, no gallops, unable to assess JVD with body habitus. Respiratory: Improved air movement bilaterally; no  wheezing, no crackles, no using accessory muscles. Abdomen: Obese, soft, nontender, positive bowel sounds Extremities: 2+ edema appreciated bilaterally with chronic stasis dermatitis.  No cyanosis or clubbing.  Discharge Instructions   Discharge Instructions    (HEART FAILURE PATIENTS) Call MD:  Anytime you have any of the following symptoms: 1) 3 pound weight gain in 24 hours or 5 pounds in 1 week 2) shortness of breath, with or without a dry hacking cough 3) swelling in the hands, feet or stomach 4) if you have to sleep on extra pillows at night in order to breathe.   Complete by: As directed    Diet - low sodium heart healthy   Complete by: As directed    Diet Carb Modified   Complete by: As directed    Discharge instructions   Complete by: As directed    Take medications as prescribed Check your weight on daily basis Maintain adequate hydration Follow heart healthy/low-sodium and modified carbohydrate diet; less than 2 g of sodium on daily basis. Arrange follow-up with PCP in 1 week as discussed.   Increase activity slowly   Complete by: As directed      Allergies as of 02/06/2021   No Known Allergies     Medication List    STOP taking these medications   Eliquis 5 MG Tabs tablet Generic drug: apixaban   ibuprofen 800 MG tablet Commonly known as: ADVIL   Klor-Con M20 20 MEQ tablet Generic drug: potassium chloride SA   metolazone 2.5 MG tablet Commonly known as: ZAROXOLYN   Potassium Chloride ER 20 MEQ Tbcr   valsartan 40 MG tablet Commonly known as: DIOVAN  TAKE these medications   Accu-Chek Aviva Plus test strip Generic drug: glucose blood TEST BLOOD SUGAR TWICE DAILY   Accu-Chek Aviva Plus w/Device Kit TEST TWO TIMES DAILY   Accu-Chek Softclix Lancets lancets USE TO TEST TWO TIMES DAILY   acetaminophen 650 MG CR tablet Commonly known as: TYLENOL Take 1,300 mg by mouth every 8 (eight) hours as needed for pain. What changed: Another medication  with the same name was removed. Continue taking this medication, and follow the directions you see here.   B-D SINGLE USE SWABS REGULAR Pads USE TWO TIMES DAILY   cholecalciferol 1000 units tablet Commonly known as: VITAMIN D Take 1 tablet (1,000 Units total) by mouth daily.   cyclobenzaprine 10 MG tablet Commonly known as: FLEXERIL Take 1 tablet (10 mg total) by mouth 3 (three) times daily as needed for muscle spasms.   diclofenac Sodium 1 % Gel Commonly known as: VOLTAREN Apply 4 g topically 4 (four) times daily as needed (joint pain).   enoxaparin 120 MG/0.8ML injection Commonly known as: LOVENOX Inject 1.6 mLs (240 mg total) into the skin daily for 3 days.   ferrous sulfate 325 (65 FE) MG tablet Take 325 mg by mouth daily.   gabapentin 100 MG capsule Commonly known as: NEURONTIN Take 2 capsules (218m) twice daily   metFORMIN 1000 MG tablet Commonly known as: GLUCOPHAGE Take 1 tablet (1,000 mg total) by mouth daily.   multivitamin tablet Take 1 tablet by mouth daily.   OXYGEN Inhale 5 L into the lungs continuous.   oxymetazoline 0.05 % nasal spray Commonly known as: AFRIN 1 spray See admin instructions. 2 sprays into one nostril as needed for nasal congestion   Potassium Citrate 99 MG Caps Take 1 capsule by mouth daily.   PRESCRIPTION MEDICATION Inhale into the lungs as needed (whenever sleeping - naps or at night). CPAP   sildenafil 25 MG tablet Commonly known as: VIAGRA Take 1 tablet (25 mg total) by mouth daily as needed for erectile dysfunction.   Torsemide 40 MG Tabs Take 80 mg by mouth daily. What changed:   medication strength  See the new instructions.   warfarin 2.5 MG tablet Commonly known as: Coumadin Take 10 mg of Coumadin every other day alternating with 12 mg.   Wrist Support/Elastic/Firm Lg Misc      No Known Allergies  Follow-up Information    Assar, Soheil, DO. Schedule an appointment as soon as possible for a visit in 10  day(s).   Specialty: Cardiology Contact information: [phone]        WAlcus Dad MD. Schedule an appointment as soon as possible for a visit in 1 week(s).   Specialty: Family Medicine Why: Schedule Follow up appt not longer than 1 week  Contact information: [place] [phone]              The results of significant diagnostics from this hospitalization (including imaging, microbiology, ancillary and laboratory) are listed below for reference.    Significant Diagnostic Studies: DG Chest Portable 1 View  Result Date: 02/02/2021 CLINICAL DATA:  Short of breath EXAM: PORTABLE CHEST 1 VIEW COMPARISON:  05/09/2020 FINDINGS: Cardiomegaly with vascular congestion. No pleural effusion, consolidation or pneumothorax IMPRESSION: Cardiomegaly with vascular congestion Electronically Signed   By: KDonavan FoilM.D.   On: 02/02/2021 18:20    Microbiology: Recent Results (from the past 240 hour(s))  SARS CORONAVIRUS 2 (TAT  6-24 HRS) Nasopharyngeal Nasopharyngeal Swab     Status: None   Collection Time: 02/02/21  7:45 PM   Specimen: Nasopharyngeal Swab  Result Value Ref Range Status   SARS Coronavirus 2 NEGATIVE NEGATIVE Final    Comment: (NOTE) SARS-CoV-2 target nucleic acids are NOT DETECTED.  The SARS-CoV-2 RNA is generally detectable in upper and lower respiratory specimens during the acute phase of infection. Negative results do not preclude SARS-CoV-2 infection, do not rule out co-infections with other pathogens, and should not be used as the sole basis for treatment or other patient management decisions. Negative results must be combined with clinical observations, patient history, and epidemiological information. The expected result is Negative.  Fact Sheet for Patients: SugarRoll.be  Fact Sheet for Healthcare  Providers: https://www.woods-mathews.com/  This test is not yet approved or cleared by the [place] FDA and  has been authorized for detection and/or diagnosis of SARS-CoV-2 by FDA under an Emergency Use Authorization (EUA). This EUA will remain  in effect (meaning this test can be used) for the duration of the COVID-19 declaration under Se ction 564(b)(1) of the Act, 21 U.S.C. section 360bbb-3(b)(1), unless the authorization is terminated or revoked sooner.  Performed at [facility], [place] [address] [place], [place] [address]      [name]: Basic Metabolic Panel: Recent [name]  Lab 02/02/21 1730 02/03/21 0539 02/04/21 0534 02/05/21 0429 02/06/21 0433  NA 135 137 137  137 138 137  K 3.6 3.1* 3.4*  3.4* 3.6 3.6  CL 93* 94* 90*  90* 89* 88*  CO2 27 34* 34*  34* 33* 34*  GLUCOSE 228* 173* 164*  163* 171* 144*  BUN 95* 90* 81*  84* 81* 84*  CREATININE 2.21* 1.91* 1.71*  1.78* 1.87* 1.95*  CALCIUM 8.9 9.4 9.3  9.3 9.5 9.2  MG 1.8 1.8  --   --   --   PHOS  --   --  3.4  --   --    Liver Function Tests: Recent [name]  Lab 02/04/21 0534  ALBUMIN 3.3*   CBC: Recent [name]  Lab 02/02/21 1730 02/03/21 0539  WBC 9.6 9.0  NEUTROABS 7.7 7.0  HGB 11.4* 11.3*  HCT 38.3* 37.5*  MCV 94.8 94.7  PLT 315 294    BNP (last 3 results) Recent [name]    02/02/21 1730  BNP 458.0*   CBG: Recent [name]  Lab 02/05/21 1140 02/05/21 1632 02/05/21 1940 02/06/21 0719 02/06/21 1130  GLUCAP 180* 145* 137* 154* 148*    Signed:  Barton Dubois MD.  [facility] 02/06/2021, 2:43 PM

## 2021-02-06 NOTE — TOC Progression Note (Signed)
Transition of Care [facility]) - Progression Note    Patient Details  Name: Roberto [name] MRN: [id number] Date of Birth: 01/02/1961  [facility]) CM/SW Contact  Natasha Bence, LCSW Phone Number: 02/06/2021, 12:20 PM  Clinical Narrative:    CSW notified of patient's PCP and HH needs. Patient agreeable to [facility] per MD. CSW referred patient to [place] with [facility] Georgina Snell agreeable to take patient. CSW attempted to schedule follow up appt with patient's PCP. Patient's PCP is not open on wknds and unable to take voicemail on answering machine. TOC to follow.    Expected Discharge Plan: Home/Self Care Barriers to Discharge: Barriers Resolved  Expected Discharge Plan and Services Expected Discharge Plan: Home/Self Care In-house Referral: Clinical Social Work Discharge Planning Services: CM Consult Post Acute Care Choice: NA Living arrangements for the past 2 months: Apartment                 DME Arranged: N/A DME Agency: NA       HH Arranged: RN,PT [place] Agency: [place]         Social Determinants of Health (SDOH) Interventions    Readmission Risk Interventions Readmission Risk Prevention Plan 02/03/2021  Medication Screening Complete  Transportation Screening Complete  Some recent data might be hidden

## 2021-02-06 NOTE — Progress Notes (Signed)
PT refusing to stand to weigh.Stated" that his knees hurt" Nursing staff and Dr. Olevia Bowens came to speak with the pt to encourage and educate the importance to have accurate weight from standing scale, and still refused. Weight on bed was 161.4kg. Dr. advised, awaiting orders. Will continue to monitor.

## 2021-02-06 NOTE — TOC Transition Note (Signed)
Transition of Care [facility]) - CM/SW Discharge Note   Patient Details  Name: Raun Routh MRN: [id number] Date of Birth: 1961/08/05  Transition [facility]) CM/SW Contact:  Natasha Bence, LCSW Phone Number: 02/06/2021, 3:16 PM   Clinical Narrative:    CSW notified of patient's readiness for discharge. CSW completed med necessity and called EMS. CSW notified Georgina Snell with [facility] of discharge. CSW also reviewed patient's need to follow up with PCP to schedule an appt. TOC signing off.   Final next level of care: [place] Barriers to Discharge: Barriers Resolved   Patient Goals and CMS Choice Patient states their goals for this hospitalization and ongoing recovery are:: Return home with [facility] CMS Medicare.gov Compare Post Acute Care list provided to:: Patient Choice offered to / list presented to : Patient  Discharge Placement                    Patient and family notified of of transfer: 02/06/21  Discharge Plan and Services In-house Referral: Clinical Social Work Discharge Planning Services: CM Consult Post Acute Care Choice: NA          DME Arranged: N/A DME Agency: NA       HH Arranged: RN,PT [place] Agency: [place]        Social Determinants of Health (SDOH) Interventions     Readmission Risk Interventions Readmission Risk Prevention Plan 02/03/2021  Medication Screening Complete  Transportation Screening Complete  Some recent data might be hidden

## 2021-02-06 NOTE — Progress Notes (Signed)
ANTICOAGULATION CONSULT NOTE - Initial Consult  Pharmacy Consult for Lovenox and Warfarin Indication: atrial fibrillation  No Known Allergies  Patient Measurements: Height: _0  (175.3 cm) Weight: (!) 162.2 kg (357 lb 9.4 oz) IBW/kg (Calculated) : 70.7  Vital Signs: Temp: 97.7 F (36.5 C) (05/15 0537) Temp Source: Oral (05/15 0537) BP: 137/100 (05/15 0537) Pulse Rate: 81 (05/15 0537)  Labs: Recent Labs    02/04/21 0534 02/05/21 0429 02/06/21 0433  LABPROT 18.2* 18.7* 20.6*  INR 1.5* 1.6* 1.8*  CREATININE 1.71*  1.78* 1.87* 1.95*    Estimated Creatinine Clearance: 61.1 mL/min (A) (by C-G formula based on SCr of 1.95 mg/dL (H)).   Medical History: Past Medical History:  Diagnosis Date  . Asthma   . Atrial fibrillation ([place])   . CHF (congestive heart failure) ([place])   . COPD (chronic obstructive pulmonary disease) ([place])   . Diabetes mellitus without complication ([place])   . Hypertension   . Pulmonary hypertension (HCC)     Medications:  Medications Prior to Admission  Medication Sig Dispense Refill Last Dose  . acetaminophen (TYLENOL) 325 MG tablet Take 2 tablets (650 mg total) by mouth every 6 (six) hours as needed. 60 tablet 0   . acetaminophen (TYLENOL) 650 MG CR tablet Take 1,300 mg by mouth every 8 (eight) hours as needed for pain.   02/02/2021 at Unknown time  . Alcohol Swabs (B-D SINGLE USE SWABS REGULAR) PADS USE TWO TIMES DAILY 200 each 3   . cholecalciferol (VITAMIN D) 1000 units tablet Take 1 tablet (1,000 Units total) by mouth daily. 90 tablet 2 02/02/2021 at Unknown time  . ELIQUIS 5 MG TABS tablet TAKE 1 TABLET TWICE DAILY (Patient taking differently: Take 5 mg by mouth daily.) 60 tablet 0 02/01/2021 at 2030  . gabapentin (NEURONTIN) 100 MG capsule Take 2 capsules (26m) twice daily 360 capsule 0 02/02/2021 at Unknown time  . ibuprofen (ADVIL) 800 MG tablet Take 800 mg by mouth every 8 (eight) hours as needed.   02/01/2021 at Unknown time  . metFORMIN  (GLUCOPHAGE) 1000 MG tablet Take 1 tablet (1,000 mg total) by mouth daily. 90 tablet 3 02/02/2021 at Unknown time  . Multiple Vitamin (MULTIVITAMIN) tablet Take 1 tablet by mouth daily.   02/02/2021 at Unknown time  . OXYGEN Inhale 5 L into the lungs continuous.   02/02/2021 at Unknown time  . oxymetazoline (AFRIN) 0.05 % nasal spray 1 spray See admin instructions. 2 sprays into one nostril as needed for nasal congestion   02/01/2021 at Unknown time  . Potassium Citrate 99 MG CAPS Take 1 capsule by mouth daily.   02/02/2021 at Unknown time  . PRESCRIPTION MEDICATION Inhale into the lungs as needed (whenever sleeping - naps or at night). CPAP   Past Week at Unknown time  . torsemide (DEMADEX) 20 MG tablet TAKE 3 TABLETS EVERY DAY (Patient taking differently: Take 60 mg by mouth every evening.) 270 tablet 0 02/01/2021 at Unknown time  . ACCU-CHEK AVIVA PLUS test strip TEST BLOOD SUGAR TWICE DAILY 200 strip 1   . Accu-Chek Softclix Lancets lancets USE TO TEST TWO TIMES DAILY 200 each 3   . Blood Glucose Monitoring Suppl (ACCU-CHEK AVIVA PLUS) w/Device KIT TEST TWO TIMES DAILY 1 kit 0   . cyclobenzaprine (FLEXERIL) 10 MG tablet Take 1 tablet (10 mg total) by mouth 3 (three) times daily as needed for muscle spasms. (Patient not taking: No sig reported) 30 tablet 0 Not Taking at Unknown time  .  Elastic Bandages & Supports (WRIST SUPPORT/ELASTIC/FIRM LG) MISC      . ferrous sulfate 325 (65 FE) MG tablet Take 325 mg by mouth daily. (Patient not taking: No sig reported)   Not Taking at Unknown time  . KLOR-CON M20 20 MEQ tablet Take 40 mEq by mouth 2 (two) times daily. (Patient not taking: No sig reported)   Not Taking at Unknown time  . metolazone (ZAROXOLYN) 2.5 MG tablet Take 2.5 mg by mouth daily. (Patient not taking: No sig reported)   Not Taking at Unknown time  . potassium chloride 20 MEQ TBCR Take 40 mEq by mouth 2 (two) times daily for 2 doses. (Patient not taking: Reported on 02/02/2021) 4 tablet 0 Not  Taking at Unknown time  . sildenafil (VIAGRA) 25 MG tablet Take 1 tablet (25 mg total) by mouth daily as needed for erectile dysfunction. (Patient not taking: No sig reported) 10 tablet 0   . valsartan (DIOVAN) 40 MG tablet Take 40 mg by mouth daily. (Patient not taking: No sig reported)   Not Taking at Unknown time    Assessment: 60 y.o. M presents with decompensated CHF. Pt was on Eliquis - dose PTA was 27m daily - Cards wants pt off of this medication but primary doctor has cut the dose from BID to once daily. Pt was ordered 555mBID on admission - last dose given 5/12 a.m. Pt would like to change to warfarin as he felt he had less issues with warfarin than Eliquis. Pharmacy consulted for Lovenox to warfarin bridge for afib. Baseline INR 1.3.  5/13 INR 1.5, subtherapeutic 1078miven 5/14 INR 1.6, subtherapeutic 12 mg given 5/15 INR 1.8, subtherapeutic  Goal of Therapy:  INR 2-3 Anti-Xa level 0.6-1 units/ml 4hrs after LMWH dose given Monitor platelets by anticoagulation protocol: Yes   Plan:  Lovenox 165 mg SQ q12h Warfarin 2m85m  Daily PT/INR  GarrDonna Christenfee, PharmD, MBA, BCGP Clinical Pharmacist  Please see amion for complete clinical pharmacist phone list 02/06/2021,7:42 AM

## 2021-02-07 ENCOUNTER — Ambulatory Visit: Payer: Medicare HMO | Admitting: Family Medicine

## 2021-02-08 ENCOUNTER — Other Ambulatory Visit: Payer: Self-pay

## 2021-02-08 DIAGNOSIS — W19XXXA Unspecified fall, initial encounter: Secondary | ICD-10-CM | POA: Diagnosis not present

## 2021-02-08 DIAGNOSIS — R531 Weakness: Secondary | ICD-10-CM | POA: Diagnosis not present

## 2021-02-08 DIAGNOSIS — I5032 Chronic diastolic (congestive) heart failure: Secondary | ICD-10-CM

## 2021-02-08 DIAGNOSIS — R5381 Other malaise: Secondary | ICD-10-CM | POA: Diagnosis not present

## 2021-02-11 ENCOUNTER — Telehealth: Payer: Self-pay | Admitting: *Deleted

## 2021-02-11 NOTE — Chronic Care Management (AMB) (Signed)
  Care Management   Note  02/11/2021 Name: Roberto [name] MRN: [id number] DOB: 04/19/1961  [name] is a 60 y.o. year old male who is a primary care patient of Alcus Dad, MD. I reached out to [name] by phone today in response to a referral sent by Mr. Braiden Armon's health plan.    Mr. Rudin was given information about care management services today including:  1. Care management services include personalized support from designated clinical staff supervised by his physician, including individualized plan of care and coordination with other care providers 2. 24/7 contact phone numbers for assistance for urgent and routine care needs. 3. The patient may stop care management services at any time by phone call to the office staff.  Patient did not agree to enrollment in care management services and does not wish to consider at this time.  Follow up plan: Patient declines engagement by the care management team. Appropriate care team members and provider have been notified via electronic communication.  The care management team is available to follow up with the patient after provider conversation with the patient regarding recommendation for care management engagement and subsequent re-referral to the care management team.   [place] [facility]

## 2021-02-14 ENCOUNTER — Other Ambulatory Visit: Payer: Self-pay | Admitting: Family Medicine

## 2021-02-14 DIAGNOSIS — N1832 Chronic kidney disease, stage 3b: Secondary | ICD-10-CM | POA: Diagnosis not present

## 2021-02-14 DIAGNOSIS — E1122 Type 2 diabetes mellitus with diabetic chronic kidney disease: Secondary | ICD-10-CM | POA: Diagnosis not present

## 2021-02-14 DIAGNOSIS — I82412 Acute embolism and thrombosis of left femoral vein: Secondary | ICD-10-CM | POA: Diagnosis not present

## 2021-02-14 DIAGNOSIS — I50813 Acute on chronic right heart failure: Secondary | ICD-10-CM | POA: Diagnosis not present

## 2021-02-14 DIAGNOSIS — E1142 Type 2 diabetes mellitus with diabetic polyneuropathy: Secondary | ICD-10-CM

## 2021-02-14 DIAGNOSIS — I4821 Permanent atrial fibrillation: Secondary | ICD-10-CM | POA: Diagnosis not present

## 2021-02-14 DIAGNOSIS — J9611 Chronic respiratory failure with hypoxia: Secondary | ICD-10-CM | POA: Diagnosis not present

## 2021-02-14 DIAGNOSIS — I130 Hypertensive heart and chronic kidney disease with heart failure and stage 1 through stage 4 chronic kidney disease, or unspecified chronic kidney disease: Secondary | ICD-10-CM | POA: Diagnosis not present

## 2021-02-14 DIAGNOSIS — I5043 Acute on chronic combined systolic (congestive) and diastolic (congestive) heart failure: Secondary | ICD-10-CM | POA: Diagnosis not present

## 2021-02-14 DIAGNOSIS — I2729 Other secondary pulmonary hypertension: Secondary | ICD-10-CM | POA: Diagnosis not present

## 2021-02-16 ENCOUNTER — Ambulatory Visit (INDEPENDENT_AMBULATORY_CARE_PROVIDER_SITE_OTHER): Payer: Medicare HMO | Admitting: Family Medicine

## 2021-02-16 ENCOUNTER — Encounter: Payer: Self-pay | Admitting: Family Medicine

## 2021-02-16 ENCOUNTER — Other Ambulatory Visit: Payer: Self-pay

## 2021-02-16 ENCOUNTER — Telehealth: Payer: Self-pay

## 2021-02-16 VITALS — BP 122/82 | HR 71

## 2021-02-16 DIAGNOSIS — Z7901 Long term (current) use of anticoagulants: Secondary | ICD-10-CM | POA: Diagnosis not present

## 2021-02-16 DIAGNOSIS — I5042 Chronic combined systolic (congestive) and diastolic (congestive) heart failure: Secondary | ICD-10-CM

## 2021-02-16 DIAGNOSIS — I82412 Acute embolism and thrombosis of left femoral vein: Secondary | ICD-10-CM | POA: Diagnosis not present

## 2021-02-16 DIAGNOSIS — R2231 Localized swelling, mass and lump, right upper limb: Secondary | ICD-10-CM

## 2021-02-16 DIAGNOSIS — E1142 Type 2 diabetes mellitus with diabetic polyneuropathy: Secondary | ICD-10-CM | POA: Diagnosis not present

## 2021-02-16 DIAGNOSIS — I4891 Unspecified atrial fibrillation: Secondary | ICD-10-CM | POA: Diagnosis not present

## 2021-02-16 DIAGNOSIS — N1832 Chronic kidney disease, stage 3b: Secondary | ICD-10-CM | POA: Diagnosis not present

## 2021-02-16 DIAGNOSIS — R6889 Other general symptoms and signs: Secondary | ICD-10-CM | POA: Diagnosis not present

## 2021-02-16 DIAGNOSIS — I5043 Acute on chronic combined systolic (congestive) and diastolic (congestive) heart failure: Secondary | ICD-10-CM | POA: Diagnosis not present

## 2021-02-16 DIAGNOSIS — R791 Abnormal coagulation profile: Secondary | ICD-10-CM | POA: Diagnosis not present

## 2021-02-16 DIAGNOSIS — I4821 Permanent atrial fibrillation: Secondary | ICD-10-CM | POA: Diagnosis not present

## 2021-02-16 DIAGNOSIS — I50813 Acute on chronic right heart failure: Secondary | ICD-10-CM | POA: Diagnosis not present

## 2021-02-16 DIAGNOSIS — E1122 Type 2 diabetes mellitus with diabetic chronic kidney disease: Secondary | ICD-10-CM | POA: Diagnosis not present

## 2021-02-16 DIAGNOSIS — J9611 Chronic respiratory failure with hypoxia: Secondary | ICD-10-CM | POA: Diagnosis not present

## 2021-02-16 DIAGNOSIS — I2729 Other secondary pulmonary hypertension: Secondary | ICD-10-CM | POA: Diagnosis not present

## 2021-02-16 DIAGNOSIS — I130 Hypertensive heart and chronic kidney disease with heart failure and stage 1 through stage 4 chronic kidney disease, or unspecified chronic kidney disease: Secondary | ICD-10-CM | POA: Diagnosis not present

## 2021-02-16 LAB — POCT INR: INR: 5.2 — AB (ref 2.0–3.0)

## 2021-02-16 NOTE — Telephone Encounter (Signed)
Received phone call from [place] with [facility] regarding patient. Patient is requesting home health nursing to perform INR checks.   Please advise if verbal orders can be given for nursing to perform this lab.   Talbot Grumbling, RN

## 2021-02-16 NOTE — Patient Instructions (Signed)
It was great to see you!  Things we discussed at today's visit: - Your INR was elevated. You should not take your Coumadin today or tomorrow. On Friday, 5/27, resume your Coumadin. Your new Coumadin dose will be 12.57m twice weekly (Mondays and Fridays) and 13mthe remaining days.  -I will arrange for your home health nurse to check your INR weekly.  -For the nodule on your hand, I have put in a referral to hand surgery. They should contact you to schedule an appointment.   We are checking some labs today. I will send you a MyChart message with the results.   Take care and seek immediate care sooner if you develop any concerns.  Dr. [facility]

## 2021-02-16 NOTE — Progress Notes (Signed)
SUBJECTIVE:   CHIEF COMPLAINT / HPI:   Hospital Follow-up Patient was admitted to [facility] from 5/11-5/15 for acute on chronic CHF. Reports he has not had any respiratory issues since discharge from the hospital. Remains on his 5L baseline O2 and CPAP nightly. He is taking Torsemide 31m daily. States he is prescribed 811mdaily but he thinks that is too much. Weighs himself daily-- most recent weight 257 (was 256 at discharge, dry weight ~255). Of note, he has right heart cath scheduled for June 10th for his pulmonary HTN.  [facility] on Coumadin for his a-fib/history of DVT. Patient had difficulty tolerating Eliquis due to epistaxis. He also attributed the skin nodules on his hands to the Eliquis. At discharge, patient's INR was 1.8. He was prescribed Lovenox to bridge until his Coumadin was therapeutic. Patient did not pick up the Lovenox prescription and has not had his INR checked since leaving the hospital. His home health nurse reportedly does not have an order to check his INR. He has been taking 1076mlternating with 59m64mily.  Nodule on R Thumb Patient has a lump on his right thumb. States it has been present for 45 years but is getting bigger. It is not painful. States it's from a pencil injury when he was younger and lead got trapped in his finger. He would like it excised.   PERTINENT  PMH / PSH:  T2DM, HTN, HLD, morbid obesity, CHF, chronic respiratory failure 2/2 severe pulmonary HTN, a-fib, DVT, CKD   OBJECTIVE:   BP 122/82   Pulse 71   SpO2 95%   General: NAD, able to participate in exam Cardiac: regular rate, irregular rhythm, S1 S2 present. Respiratory: CTAB, normal effort, No wheezes, rales or rhonchi Abdomen: non-tender Skin: warm and dry, 1cm subcutaneous nodule on right thumb at the MCP joint with blueish grey discoloration centrally and ?central punctum. Nontender to palpation.  Neuro:  alert, no obvious focal deficits Psych: Normal affect and mood   ASSESSMENT/PLAN:   Chronic Anti-coagulation, Supratherapeutic INR Patient restarted on Coumadin while hospitalized 2 weeks ago due to patient preference/poor tolerance of Eliquis. Today his INR is supratherapeutic at 5.2 (confirmed via venous sample). Goal INR 2-3. -Hold Coumadin today and tomorrow -Resume on Friday 5/27 with 12.5mg 44man for 12.5mg t26me weekly (Monday and Friday) and 10mg t47memaining days -Home health nurse given orders to check Coumadin weekly  Combined Systolic/Diastolic Heart Failure Chronic Respiratory Failure 2/2 Severe Pulmonary HTN Chronic, stable on 5L O2. Patient is at baseline. Dry weight 355 (weight 357 at home yesterday per patient). -Continue Torsemide 60mg da35m-Check BMP today -Follow up with cardiology -Right heart cath scheduled for June 10th  Right Thumb Nodule Present x45 years per patient. Asymptomatic but he would like it excised. He reportedly has lead under his skin in that area. No signs of infection. Alternatively could be large epidermal cyst. -Referral to hand surgery for removal  Diabetes Patient's A1c found to be 8.7 on 02/02/2021 while admitted. Currently on Metformin 1000mg dai34m -Will recheck BMP today -Would benefit from both statin therapy and SGLT-2 due to his comorbidities and A1c above goal. He is not on ACE/ARB either. -Did not have time at today's visit to discuss starting these medications due to addressing acute issue of his INR. Follow-up in 3-4 weeks for diabetes visit specifically.   Mersadie Kavanaugh Alcus Dad HealLucas

## 2021-02-17 DIAGNOSIS — J9611 Chronic respiratory failure with hypoxia: Secondary | ICD-10-CM | POA: Diagnosis not present

## 2021-02-17 DIAGNOSIS — I82412 Acute embolism and thrombosis of left femoral vein: Secondary | ICD-10-CM | POA: Diagnosis not present

## 2021-02-17 DIAGNOSIS — E1122 Type 2 diabetes mellitus with diabetic chronic kidney disease: Secondary | ICD-10-CM | POA: Diagnosis not present

## 2021-02-17 DIAGNOSIS — N1832 Chronic kidney disease, stage 3b: Secondary | ICD-10-CM | POA: Diagnosis not present

## 2021-02-17 DIAGNOSIS — I4821 Permanent atrial fibrillation: Secondary | ICD-10-CM | POA: Diagnosis not present

## 2021-02-17 DIAGNOSIS — I50813 Acute on chronic right heart failure: Secondary | ICD-10-CM | POA: Diagnosis not present

## 2021-02-17 DIAGNOSIS — I2729 Other secondary pulmonary hypertension: Secondary | ICD-10-CM | POA: Diagnosis not present

## 2021-02-17 DIAGNOSIS — I5043 Acute on chronic combined systolic (congestive) and diastolic (congestive) heart failure: Secondary | ICD-10-CM | POA: Diagnosis not present

## 2021-02-17 DIAGNOSIS — I130 Hypertensive heart and chronic kidney disease with heart failure and stage 1 through stage 4 chronic kidney disease, or unspecified chronic kidney disease: Secondary | ICD-10-CM | POA: Diagnosis not present

## 2021-02-17 LAB — BASIC METABOLIC PANEL
BUN/Creatinine Ratio: 37 — ABNORMAL HIGH (ref 10–24)
BUN: 72 mg/dL — ABNORMAL HIGH (ref 8–27)
CO2: 29 mmol/L (ref 20–29)
Calcium: 9.6 mg/dL (ref 8.6–10.2)
Chloride: 87 mmol/L — ABNORMAL LOW (ref 96–106)
Creatinine, Ser: 1.96 mg/dL — ABNORMAL HIGH (ref 0.76–1.27)
Glucose: 248 mg/dL — ABNORMAL HIGH (ref 65–99)
Potassium: 3.6 mmol/L (ref 3.5–5.2)
Sodium: 136 mmol/L (ref 134–144)
eGFR: 38 mL/min/{1.73_m2} — ABNORMAL LOW (ref >59)

## 2021-02-17 LAB — PROTIME-INR
INR: 5.2 (ref 0.9–1.2)
Prothrombin Time: 48.7 s — ABNORMAL HIGH (ref 9.1–12.0)

## 2021-02-17 NOTE — Telephone Encounter (Signed)
Called [place] and provided verbal orders for weekly INR check.   Talbot Grumbling, RN

## 2021-02-17 NOTE — Telephone Encounter (Signed)
Yes-- please give verbal orders for weekly INR check starting 6/1

## 2021-02-21 DIAGNOSIS — Z86718 Personal history of other venous thrombosis and embolism: Secondary | ICD-10-CM | POA: Diagnosis not present

## 2021-02-21 DIAGNOSIS — I110 Hypertensive heart disease with heart failure: Secondary | ICD-10-CM | POA: Diagnosis not present

## 2021-02-21 DIAGNOSIS — Z79899 Other long term (current) drug therapy: Secondary | ICD-10-CM | POA: Diagnosis not present

## 2021-02-21 DIAGNOSIS — I517 Cardiomegaly: Secondary | ICD-10-CM | POA: Diagnosis not present

## 2021-02-21 DIAGNOSIS — Z7984 Long term (current) use of oral hypoglycemic drugs: Secondary | ICD-10-CM | POA: Diagnosis not present

## 2021-02-21 DIAGNOSIS — I509 Heart failure, unspecified: Secondary | ICD-10-CM | POA: Diagnosis not present

## 2021-02-21 DIAGNOSIS — R21 Rash and other nonspecific skin eruption: Secondary | ICD-10-CM | POA: Diagnosis not present

## 2021-02-21 DIAGNOSIS — E669 Obesity, unspecified: Secondary | ICD-10-CM | POA: Diagnosis not present

## 2021-02-21 DIAGNOSIS — J449 Chronic obstructive pulmonary disease, unspecified: Secondary | ICD-10-CM | POA: Diagnosis not present

## 2021-02-21 DIAGNOSIS — R0789 Other chest pain: Secondary | ICD-10-CM | POA: Diagnosis not present

## 2021-02-21 DIAGNOSIS — R079 Chest pain, unspecified: Secondary | ICD-10-CM | POA: Diagnosis not present

## 2021-02-21 DIAGNOSIS — E119 Type 2 diabetes mellitus without complications: Secondary | ICD-10-CM | POA: Diagnosis not present

## 2021-02-23 ENCOUNTER — Telehealth: Payer: Self-pay

## 2021-02-23 DIAGNOSIS — I4821 Permanent atrial fibrillation: Secondary | ICD-10-CM | POA: Diagnosis not present

## 2021-02-23 DIAGNOSIS — I2729 Other secondary pulmonary hypertension: Secondary | ICD-10-CM | POA: Diagnosis not present

## 2021-02-23 DIAGNOSIS — I130 Hypertensive heart and chronic kidney disease with heart failure and stage 1 through stage 4 chronic kidney disease, or unspecified chronic kidney disease: Secondary | ICD-10-CM | POA: Diagnosis not present

## 2021-02-23 DIAGNOSIS — E1122 Type 2 diabetes mellitus with diabetic chronic kidney disease: Secondary | ICD-10-CM | POA: Diagnosis not present

## 2021-02-23 DIAGNOSIS — I5043 Acute on chronic combined systolic (congestive) and diastolic (congestive) heart failure: Secondary | ICD-10-CM | POA: Diagnosis not present

## 2021-02-23 DIAGNOSIS — I50813 Acute on chronic right heart failure: Secondary | ICD-10-CM | POA: Diagnosis not present

## 2021-02-23 DIAGNOSIS — I82412 Acute embolism and thrombosis of left femoral vein: Secondary | ICD-10-CM | POA: Diagnosis not present

## 2021-02-23 DIAGNOSIS — N1832 Chronic kidney disease, stage 3b: Secondary | ICD-10-CM | POA: Diagnosis not present

## 2021-02-23 DIAGNOSIS — J9611 Chronic respiratory failure with hypoxia: Secondary | ICD-10-CM | POA: Diagnosis not present

## 2021-02-23 NOTE — Telephone Encounter (Signed)
Provider returned page. Advised to call patient to assess for signs of bleeding and instruct patient not to take coumadin for the next two days.   Patient reports that he is currently having a nose bleed that has been ongoing for 15 minutes.   Spoke with precepting physicians, Pray and Chambliss, for immediate plan of care. Advised that patient continue to hold direct pressure for the next 15 minutes. If bleeding does not stop, patient will need further evaluation in the ED. Advised that patient be reevaluated in clinic on Monday and for patient to hold Coumadin until this appointment.   Informed patient of plan. Patient verbalizes understanding.   ED precautions given.   Talbot Grumbling, RN

## 2021-02-23 NOTE — Telephone Encounter (Signed)
Received phone call from [place], RN with [facility] with patient's INR results.   PT INR- 4.7  RN reports that patient has not taken warfarin since Sunday, 5/29.  Paged provider at [phone]

## 2021-02-25 DIAGNOSIS — I2729 Other secondary pulmonary hypertension: Secondary | ICD-10-CM | POA: Diagnosis not present

## 2021-02-25 DIAGNOSIS — N1832 Chronic kidney disease, stage 3b: Secondary | ICD-10-CM | POA: Diagnosis not present

## 2021-02-25 DIAGNOSIS — I130 Hypertensive heart and chronic kidney disease with heart failure and stage 1 through stage 4 chronic kidney disease, or unspecified chronic kidney disease: Secondary | ICD-10-CM | POA: Diagnosis not present

## 2021-02-25 DIAGNOSIS — J9611 Chronic respiratory failure with hypoxia: Secondary | ICD-10-CM | POA: Diagnosis not present

## 2021-02-25 DIAGNOSIS — I82412 Acute embolism and thrombosis of left femoral vein: Secondary | ICD-10-CM | POA: Diagnosis not present

## 2021-02-25 DIAGNOSIS — I4821 Permanent atrial fibrillation: Secondary | ICD-10-CM | POA: Diagnosis not present

## 2021-02-25 DIAGNOSIS — E1122 Type 2 diabetes mellitus with diabetic chronic kidney disease: Secondary | ICD-10-CM | POA: Diagnosis not present

## 2021-02-25 DIAGNOSIS — I50813 Acute on chronic right heart failure: Secondary | ICD-10-CM | POA: Diagnosis not present

## 2021-02-25 DIAGNOSIS — I5043 Acute on chronic combined systolic (congestive) and diastolic (congestive) heart failure: Secondary | ICD-10-CM | POA: Diagnosis not present

## 2021-02-27 DIAGNOSIS — J9611 Chronic respiratory failure with hypoxia: Secondary | ICD-10-CM | POA: Diagnosis not present

## 2021-02-27 DIAGNOSIS — I509 Heart failure, unspecified: Secondary | ICD-10-CM | POA: Diagnosis not present

## 2021-02-28 ENCOUNTER — Telehealth: Payer: Self-pay

## 2021-02-28 ENCOUNTER — Other Ambulatory Visit: Payer: Self-pay

## 2021-02-28 ENCOUNTER — Ambulatory Visit (INDEPENDENT_AMBULATORY_CARE_PROVIDER_SITE_OTHER): Payer: Medicare HMO | Admitting: Family Medicine

## 2021-02-28 VITALS — BP 116/76 | HR 85

## 2021-02-28 DIAGNOSIS — M533 Sacrococcygeal disorders, not elsewhere classified: Secondary | ICD-10-CM | POA: Insufficient documentation

## 2021-02-28 DIAGNOSIS — Z7901 Long term (current) use of anticoagulants: Secondary | ICD-10-CM | POA: Diagnosis not present

## 2021-02-28 DIAGNOSIS — I4891 Unspecified atrial fibrillation: Secondary | ICD-10-CM

## 2021-02-28 DIAGNOSIS — R6889 Other general symptoms and signs: Secondary | ICD-10-CM | POA: Diagnosis not present

## 2021-02-28 DIAGNOSIS — E1142 Type 2 diabetes mellitus with diabetic polyneuropathy: Secondary | ICD-10-CM | POA: Diagnosis not present

## 2021-02-28 LAB — POCT INR: INR: 1.4 — AB (ref 2.0–3.0)

## 2021-02-28 MED ORDER — TRULICITY 0.75 MG/0.5ML ~~LOC~~ SOAJ: 0.7500 mg | SUBCUTANEOUS | 1 refills | Status: AC

## 2021-02-28 MED ORDER — WARFARIN SODIUM 2.5 MG PO TABS: ORAL_TABLET | ORAL | 2 refills | Status: DC

## 2021-02-28 NOTE — Assessment & Plan Note (Addendum)
A1c 8.7%.  Will retrial Trulicity weekly as he tolerated this well in the past, hopefully covered by his insurance.  Continue metformin 1000 mg as is, recheck BMP today as recent CMP with GFR <30.  If persistently low, may need to completely discontinue metformin.  Provided support and encouragement towards balancing his diet.  Will postpone discussion for statin/ARB with PCP on follow-up.

## 2021-02-28 NOTE — Patient Instructions (Signed)
It was wonderful to see you today.  I encourage you to purchase a doughnut cushion to offload the pressure in your tailbone.  You can continue using Biofreeze or can also place ice/heat for 20 minutes at a time several times a day to help.  If it is not feeling better in the next several weeks or sooner if getting worse, please follow-up.  We will restart your warfarin at 10 mg daily.  Follow-up next Monday for repeat INR check.  We will also be starting a medication called Trulicity if your insurance will cover this appropriately.  This is a weekly injection as you previously have done.  Sometimes can cause some nausea but should get better with some time.  If you are having any trouble with this medicine, please let us know.

## 2021-02-28 NOTE — Assessment & Plan Note (Signed)
Without preceding injury, suspect repetitive compression of the region.  No additional skin lesions or erythema to suggest contributing cellulitis or ulceration at least over low back and superior gluteus--however unable to fully visualize due to patient habitus and inability to maneuver out of his chair currently.  Instructed to visualize the area himself at home and to contact if any abnormality seen.  Encouraged continued Biofreeze, ice/heat, and purchasing a donut cushion to offput pressure.

## 2021-02-28 NOTE — Progress Notes (Signed)
    SUBJECTIVE:   CHIEF COMPLAINT / HPI: INR check   Roberto [name] is a 60 year old gentleman presenting to a same-day appointment to discuss the following:  INR check: He last took his warfarin this past Wednesday, 6/1.  It has been checked several times and persistently elevated, most recently on 5/30 was 4.3.  He previously was using warfarin 10 mg daily with 12.5 mg twice weekly.  Tailbone pain: Started about a few weeks ago, insidiously. No preceding injury or trauma. Constant aching when he is sitting down. Doesn't hurt when he is laying down/off putting pressure. Uses biofreeze on the area which does improve the pain. He also has been sitting a cushioned pillow with minimal relief. He has not tried any other medications or ice/heat in the area. No fever. No rash that he is aware of but hasn't looked, has not felt any bumps or skin breakdown.   T2DM: Most recent A1c 8.7% on 02/02/2021.  He is taking metformin 1000 mg daily with a known history of CKD stage IIIb.  He reports "poor eating habits "in reference to why his A1c has likely gone up recently.  States he "loves" sweet tea.  Previously tried Trulicity was not covered by his insurance at the time, tolerated well otherwise.  PERTINENT  PMH / PSH: Combined systolic/diastolic heart failure, A. fib, recent DVT, T2DM, chronic hypoxic respiratory failure on home O2, elevated BMI  OBJECTIVE:   BP 116/76   Pulse 85   SpO2 90%   General: Alert, NAD HEENT: NCAT, MMM Lungs: Clear bilaterally, no increased WOB on home O2  Msk: Moves all extremities spontaneously, visualized lower back and superior portion of bilateral buttocks with no rash, erythema, or ulceration.  No tenderness to palpation of low back however not able to reach coccyx region. Ext: Warm, dry.  Sitting in motorized wheelchair.   ASSESSMENT/PLAN:   Warfarin anticoagulation INR 1.4 today, therapeutic goal of 2-3.  Will restart warfarin today with ~ 10% reduction from previous  dosing, start 10 mg daily with repeat INR check on Monday 6/13 via his home health nurse.  Coccyodynia Without preceding injury, suspect repetitive compression of the region.  No additional skin lesions or erythema to suggest contributing cellulitis or ulceration at least over low back and superior gluteus--however unable to fully visualize due to patient habitus and inability to maneuver out of his chair currently.  Instructed to visualize the area himself at home and to contact if any abnormality seen.  Encouraged continued Biofreeze, ice/heat, and purchasing a donut cushion to offput pressure.   Type 2 diabetes mellitus with diabetic polyneuropathy, without long-term current use of insulin (HCC) A1c 8.7%.  Will retrial Trulicity weekly as he tolerated this well in the past, hopefully covered by his insurance.  Continue metformin 1000 mg as is, recheck BMP today as recent CMP with GFR <30.  If persistently low, may need to completely discontinue metformin.  Provided support and encouragement towards balancing his diet.  Will postpone discussion for statin/ARB with PCP on follow-up.    Discussed with Dr. Wendy Poet.  Follow-up INR in 1 week, otherwise already has follow-up with PCP in the next 2-3 weeks to check-in or sooner if needed.  Roberto [name], [place]

## 2021-02-28 NOTE — Telephone Encounter (Signed)
[facility] calls nurse line requesting verbal orders for [facility] PT as follows.  2x a week for 4 weeks 1x a week for 4 weeks   Verbal orders given per [facility] protocol.

## 2021-02-28 NOTE — Assessment & Plan Note (Signed)
INR 1.4 today, therapeutic goal of 2-3.  Will restart warfarin today with ~ 10% reduction from previous dosing, start 10 mg daily with repeat INR check on Monday 6/13 via his home health nurse.

## 2021-03-01 DIAGNOSIS — N1832 Chronic kidney disease, stage 3b: Secondary | ICD-10-CM | POA: Diagnosis not present

## 2021-03-01 DIAGNOSIS — I5043 Acute on chronic combined systolic (congestive) and diastolic (congestive) heart failure: Secondary | ICD-10-CM | POA: Diagnosis not present

## 2021-03-01 DIAGNOSIS — I50813 Acute on chronic right heart failure: Secondary | ICD-10-CM | POA: Diagnosis not present

## 2021-03-01 DIAGNOSIS — I2729 Other secondary pulmonary hypertension: Secondary | ICD-10-CM | POA: Diagnosis not present

## 2021-03-01 DIAGNOSIS — J9611 Chronic respiratory failure with hypoxia: Secondary | ICD-10-CM | POA: Diagnosis not present

## 2021-03-01 DIAGNOSIS — I130 Hypertensive heart and chronic kidney disease with heart failure and stage 1 through stage 4 chronic kidney disease, or unspecified chronic kidney disease: Secondary | ICD-10-CM | POA: Diagnosis not present

## 2021-03-01 DIAGNOSIS — I4821 Permanent atrial fibrillation: Secondary | ICD-10-CM | POA: Diagnosis not present

## 2021-03-01 DIAGNOSIS — E1122 Type 2 diabetes mellitus with diabetic chronic kidney disease: Secondary | ICD-10-CM | POA: Diagnosis not present

## 2021-03-01 DIAGNOSIS — I82412 Acute embolism and thrombosis of left femoral vein: Secondary | ICD-10-CM | POA: Diagnosis not present

## 2021-03-01 LAB — BASIC METABOLIC PANEL
BUN/Creatinine Ratio: 37 — ABNORMAL HIGH (ref 10–24)
BUN: 73 mg/dL — ABNORMAL HIGH (ref 8–27)
CO2: 32 mmol/L — ABNORMAL HIGH (ref 20–29)
Calcium: 9.6 mg/dL (ref 8.6–10.2)
Chloride: 87 mmol/L — ABNORMAL LOW (ref 96–106)
Creatinine, Ser: 1.97 mg/dL — ABNORMAL HIGH (ref 0.76–1.27)
Glucose: 229 mg/dL — ABNORMAL HIGH (ref 65–99)
Potassium: 3.5 mmol/L (ref 3.5–5.2)
Sodium: 138 mmol/L (ref 134–144)
eGFR: 38 mL/min/{1.73_m2} — ABNORMAL LOW (ref >59)

## 2021-03-02 DIAGNOSIS — I2729 Other secondary pulmonary hypertension: Secondary | ICD-10-CM | POA: Diagnosis not present

## 2021-03-02 DIAGNOSIS — I82412 Acute embolism and thrombosis of left femoral vein: Secondary | ICD-10-CM | POA: Diagnosis not present

## 2021-03-02 DIAGNOSIS — J9611 Chronic respiratory failure with hypoxia: Secondary | ICD-10-CM | POA: Diagnosis not present

## 2021-03-02 DIAGNOSIS — I5043 Acute on chronic combined systolic (congestive) and diastolic (congestive) heart failure: Secondary | ICD-10-CM | POA: Diagnosis not present

## 2021-03-02 DIAGNOSIS — N1832 Chronic kidney disease, stage 3b: Secondary | ICD-10-CM | POA: Diagnosis not present

## 2021-03-02 DIAGNOSIS — I50813 Acute on chronic right heart failure: Secondary | ICD-10-CM | POA: Diagnosis not present

## 2021-03-02 DIAGNOSIS — E1122 Type 2 diabetes mellitus with diabetic chronic kidney disease: Secondary | ICD-10-CM | POA: Diagnosis not present

## 2021-03-02 DIAGNOSIS — I130 Hypertensive heart and chronic kidney disease with heart failure and stage 1 through stage 4 chronic kidney disease, or unspecified chronic kidney disease: Secondary | ICD-10-CM | POA: Diagnosis not present

## 2021-03-02 DIAGNOSIS — I4821 Permanent atrial fibrillation: Secondary | ICD-10-CM | POA: Diagnosis not present

## 2021-03-03 DIAGNOSIS — N1832 Chronic kidney disease, stage 3b: Secondary | ICD-10-CM | POA: Diagnosis not present

## 2021-03-03 DIAGNOSIS — J9611 Chronic respiratory failure with hypoxia: Secondary | ICD-10-CM | POA: Diagnosis not present

## 2021-03-03 DIAGNOSIS — I50813 Acute on chronic right heart failure: Secondary | ICD-10-CM | POA: Diagnosis not present

## 2021-03-03 DIAGNOSIS — E1122 Type 2 diabetes mellitus with diabetic chronic kidney disease: Secondary | ICD-10-CM | POA: Diagnosis not present

## 2021-03-03 DIAGNOSIS — I130 Hypertensive heart and chronic kidney disease with heart failure and stage 1 through stage 4 chronic kidney disease, or unspecified chronic kidney disease: Secondary | ICD-10-CM | POA: Diagnosis not present

## 2021-03-03 DIAGNOSIS — I5043 Acute on chronic combined systolic (congestive) and diastolic (congestive) heart failure: Secondary | ICD-10-CM | POA: Diagnosis not present

## 2021-03-03 DIAGNOSIS — I4821 Permanent atrial fibrillation: Secondary | ICD-10-CM | POA: Diagnosis not present

## 2021-03-03 DIAGNOSIS — I2729 Other secondary pulmonary hypertension: Secondary | ICD-10-CM | POA: Diagnosis not present

## 2021-03-03 DIAGNOSIS — I82412 Acute embolism and thrombosis of left femoral vein: Secondary | ICD-10-CM | POA: Diagnosis not present

## 2021-03-04 DIAGNOSIS — E1122 Type 2 diabetes mellitus with diabetic chronic kidney disease: Secondary | ICD-10-CM | POA: Diagnosis not present

## 2021-03-04 DIAGNOSIS — I130 Hypertensive heart and chronic kidney disease with heart failure and stage 1 through stage 4 chronic kidney disease, or unspecified chronic kidney disease: Secondary | ICD-10-CM | POA: Diagnosis not present

## 2021-03-04 DIAGNOSIS — R6889 Other general symptoms and signs: Secondary | ICD-10-CM | POA: Diagnosis not present

## 2021-03-04 DIAGNOSIS — J449 Chronic obstructive pulmonary disease, unspecified: Secondary | ICD-10-CM | POA: Diagnosis not present

## 2021-03-04 DIAGNOSIS — I272 Pulmonary hypertension, unspecified: Secondary | ICD-10-CM | POA: Diagnosis not present

## 2021-03-04 DIAGNOSIS — Z7901 Long term (current) use of anticoagulants: Secondary | ICD-10-CM | POA: Diagnosis not present

## 2021-03-04 DIAGNOSIS — I5042 Chronic combined systolic (congestive) and diastolic (congestive) heart failure: Secondary | ICD-10-CM | POA: Diagnosis not present

## 2021-03-04 DIAGNOSIS — I4891 Unspecified atrial fibrillation: Secondary | ICD-10-CM | POA: Diagnosis not present

## 2021-03-04 DIAGNOSIS — N189 Chronic kidney disease, unspecified: Secondary | ICD-10-CM | POA: Diagnosis not present

## 2021-03-07 ENCOUNTER — Telehealth: Payer: Self-pay

## 2021-03-07 DIAGNOSIS — I2729 Other secondary pulmonary hypertension: Secondary | ICD-10-CM | POA: Diagnosis not present

## 2021-03-07 DIAGNOSIS — J9611 Chronic respiratory failure with hypoxia: Secondary | ICD-10-CM | POA: Diagnosis not present

## 2021-03-07 DIAGNOSIS — I50813 Acute on chronic right heart failure: Secondary | ICD-10-CM | POA: Diagnosis not present

## 2021-03-07 DIAGNOSIS — E1122 Type 2 diabetes mellitus with diabetic chronic kidney disease: Secondary | ICD-10-CM | POA: Diagnosis not present

## 2021-03-07 DIAGNOSIS — I4821 Permanent atrial fibrillation: Secondary | ICD-10-CM | POA: Diagnosis not present

## 2021-03-07 DIAGNOSIS — I130 Hypertensive heart and chronic kidney disease with heart failure and stage 1 through stage 4 chronic kidney disease, or unspecified chronic kidney disease: Secondary | ICD-10-CM | POA: Diagnosis not present

## 2021-03-07 DIAGNOSIS — I5043 Acute on chronic combined systolic (congestive) and diastolic (congestive) heart failure: Secondary | ICD-10-CM | POA: Diagnosis not present

## 2021-03-07 DIAGNOSIS — N1832 Chronic kidney disease, stage 3b: Secondary | ICD-10-CM | POA: Diagnosis not present

## 2021-03-07 DIAGNOSIS — I82412 Acute embolism and thrombosis of left femoral vein: Secondary | ICD-10-CM | POA: Diagnosis not present

## 2021-03-07 LAB — POCT INR: INR: 1.9 — AB (ref 2.0–3.0)

## 2021-03-07 NOTE — Telephone Encounter (Signed)
Debbie patients home health nurse calls nurse line reporting today's INR results.   INR 1.9  I spoke with Roberto [name] who pull his schedule and precept next steps.

## 2021-03-08 ENCOUNTER — Ambulatory Visit (INDEPENDENT_AMBULATORY_CARE_PROVIDER_SITE_OTHER): Payer: Medicare HMO | Admitting: *Deleted

## 2021-03-08 DIAGNOSIS — I2729 Other secondary pulmonary hypertension: Secondary | ICD-10-CM | POA: Diagnosis not present

## 2021-03-08 DIAGNOSIS — I50813 Acute on chronic right heart failure: Secondary | ICD-10-CM | POA: Diagnosis not present

## 2021-03-08 DIAGNOSIS — I5043 Acute on chronic combined systolic (congestive) and diastolic (congestive) heart failure: Secondary | ICD-10-CM | POA: Diagnosis not present

## 2021-03-08 DIAGNOSIS — E1122 Type 2 diabetes mellitus with diabetic chronic kidney disease: Secondary | ICD-10-CM | POA: Diagnosis not present

## 2021-03-08 DIAGNOSIS — I82412 Acute embolism and thrombosis of left femoral vein: Secondary | ICD-10-CM | POA: Diagnosis not present

## 2021-03-08 DIAGNOSIS — I130 Hypertensive heart and chronic kidney disease with heart failure and stage 1 through stage 4 chronic kidney disease, or unspecified chronic kidney disease: Secondary | ICD-10-CM | POA: Diagnosis not present

## 2021-03-08 DIAGNOSIS — J9611 Chronic respiratory failure with hypoxia: Secondary | ICD-10-CM | POA: Diagnosis not present

## 2021-03-08 DIAGNOSIS — N1832 Chronic kidney disease, stage 3b: Secondary | ICD-10-CM | POA: Diagnosis not present

## 2021-03-08 DIAGNOSIS — I4821 Permanent atrial fibrillation: Secondary | ICD-10-CM | POA: Diagnosis not present

## 2021-03-10 DIAGNOSIS — I2729 Other secondary pulmonary hypertension: Secondary | ICD-10-CM | POA: Diagnosis not present

## 2021-03-10 DIAGNOSIS — I82412 Acute embolism and thrombosis of left femoral vein: Secondary | ICD-10-CM | POA: Diagnosis not present

## 2021-03-10 DIAGNOSIS — I50813 Acute on chronic right heart failure: Secondary | ICD-10-CM | POA: Diagnosis not present

## 2021-03-10 DIAGNOSIS — I4821 Permanent atrial fibrillation: Secondary | ICD-10-CM | POA: Diagnosis not present

## 2021-03-10 DIAGNOSIS — J9611 Chronic respiratory failure with hypoxia: Secondary | ICD-10-CM | POA: Diagnosis not present

## 2021-03-10 DIAGNOSIS — I5043 Acute on chronic combined systolic (congestive) and diastolic (congestive) heart failure: Secondary | ICD-10-CM | POA: Diagnosis not present

## 2021-03-10 DIAGNOSIS — N1832 Chronic kidney disease, stage 3b: Secondary | ICD-10-CM | POA: Diagnosis not present

## 2021-03-10 DIAGNOSIS — E1122 Type 2 diabetes mellitus with diabetic chronic kidney disease: Secondary | ICD-10-CM | POA: Diagnosis not present

## 2021-03-10 DIAGNOSIS — I130 Hypertensive heart and chronic kidney disease with heart failure and stage 1 through stage 4 chronic kidney disease, or unspecified chronic kidney disease: Secondary | ICD-10-CM | POA: Diagnosis not present

## 2021-03-14 ENCOUNTER — Ambulatory Visit (INDEPENDENT_AMBULATORY_CARE_PROVIDER_SITE_OTHER): Payer: Medicare HMO | Admitting: *Deleted

## 2021-03-14 DIAGNOSIS — I82412 Acute embolism and thrombosis of left femoral vein: Secondary | ICD-10-CM | POA: Diagnosis not present

## 2021-03-14 DIAGNOSIS — E119 Type 2 diabetes mellitus without complications: Secondary | ICD-10-CM | POA: Diagnosis not present

## 2021-03-14 DIAGNOSIS — N1832 Chronic kidney disease, stage 3b: Secondary | ICD-10-CM | POA: Diagnosis not present

## 2021-03-14 DIAGNOSIS — I5043 Acute on chronic combined systolic (congestive) and diastolic (congestive) heart failure: Secondary | ICD-10-CM | POA: Diagnosis not present

## 2021-03-14 DIAGNOSIS — I50813 Acute on chronic right heart failure: Secondary | ICD-10-CM | POA: Diagnosis not present

## 2021-03-14 DIAGNOSIS — I130 Hypertensive heart and chronic kidney disease with heart failure and stage 1 through stage 4 chronic kidney disease, or unspecified chronic kidney disease: Secondary | ICD-10-CM | POA: Diagnosis not present

## 2021-03-14 DIAGNOSIS — J9611 Chronic respiratory failure with hypoxia: Secondary | ICD-10-CM | POA: Diagnosis not present

## 2021-03-14 DIAGNOSIS — I4821 Permanent atrial fibrillation: Secondary | ICD-10-CM | POA: Diagnosis not present

## 2021-03-14 DIAGNOSIS — I2729 Other secondary pulmonary hypertension: Secondary | ICD-10-CM | POA: Diagnosis not present

## 2021-03-14 DIAGNOSIS — E1122 Type 2 diabetes mellitus with diabetic chronic kidney disease: Secondary | ICD-10-CM | POA: Diagnosis not present

## 2021-03-14 LAB — POCT INR: INR: 3 (ref 2.0–3.0)

## 2021-03-15 DIAGNOSIS — I130 Hypertensive heart and chronic kidney disease with heart failure and stage 1 through stage 4 chronic kidney disease, or unspecified chronic kidney disease: Secondary | ICD-10-CM | POA: Diagnosis not present

## 2021-03-15 DIAGNOSIS — E1122 Type 2 diabetes mellitus with diabetic chronic kidney disease: Secondary | ICD-10-CM | POA: Diagnosis not present

## 2021-03-15 DIAGNOSIS — I5043 Acute on chronic combined systolic (congestive) and diastolic (congestive) heart failure: Secondary | ICD-10-CM | POA: Diagnosis not present

## 2021-03-15 DIAGNOSIS — J9611 Chronic respiratory failure with hypoxia: Secondary | ICD-10-CM | POA: Diagnosis not present

## 2021-03-15 DIAGNOSIS — I82412 Acute embolism and thrombosis of left femoral vein: Secondary | ICD-10-CM | POA: Diagnosis not present

## 2021-03-15 DIAGNOSIS — N1832 Chronic kidney disease, stage 3b: Secondary | ICD-10-CM | POA: Diagnosis not present

## 2021-03-15 DIAGNOSIS — I2729 Other secondary pulmonary hypertension: Secondary | ICD-10-CM | POA: Diagnosis not present

## 2021-03-15 DIAGNOSIS — I50813 Acute on chronic right heart failure: Secondary | ICD-10-CM | POA: Diagnosis not present

## 2021-03-15 DIAGNOSIS — I4821 Permanent atrial fibrillation: Secondary | ICD-10-CM | POA: Diagnosis not present

## 2021-03-21 ENCOUNTER — Ambulatory Visit (INDEPENDENT_AMBULATORY_CARE_PROVIDER_SITE_OTHER): Payer: Medicare HMO | Admitting: Family Medicine

## 2021-03-21 DIAGNOSIS — I50813 Acute on chronic right heart failure: Secondary | ICD-10-CM | POA: Diagnosis not present

## 2021-03-21 DIAGNOSIS — I130 Hypertensive heart and chronic kidney disease with heart failure and stage 1 through stage 4 chronic kidney disease, or unspecified chronic kidney disease: Secondary | ICD-10-CM | POA: Diagnosis not present

## 2021-03-21 DIAGNOSIS — E1122 Type 2 diabetes mellitus with diabetic chronic kidney disease: Secondary | ICD-10-CM | POA: Diagnosis not present

## 2021-03-21 DIAGNOSIS — I82412 Acute embolism and thrombosis of left femoral vein: Secondary | ICD-10-CM | POA: Diagnosis not present

## 2021-03-21 DIAGNOSIS — J9611 Chronic respiratory failure with hypoxia: Secondary | ICD-10-CM | POA: Diagnosis not present

## 2021-03-21 DIAGNOSIS — I2729 Other secondary pulmonary hypertension: Secondary | ICD-10-CM | POA: Diagnosis not present

## 2021-03-21 DIAGNOSIS — N1832 Chronic kidney disease, stage 3b: Secondary | ICD-10-CM | POA: Diagnosis not present

## 2021-03-21 DIAGNOSIS — I5043 Acute on chronic combined systolic (congestive) and diastolic (congestive) heart failure: Secondary | ICD-10-CM | POA: Diagnosis not present

## 2021-03-21 DIAGNOSIS — I4821 Permanent atrial fibrillation: Secondary | ICD-10-CM | POA: Diagnosis not present

## 2021-03-21 LAB — POCT INR: INR: 3.2 — AB (ref 2.0–3.0)

## 2021-03-23 ENCOUNTER — Ambulatory Visit: Payer: Medicare HMO | Admitting: Family Medicine

## 2021-03-24 DIAGNOSIS — R6889 Other general symptoms and signs: Secondary | ICD-10-CM | POA: Diagnosis not present

## 2021-03-24 DIAGNOSIS — S60551A Superficial foreign body of right hand, initial encounter: Secondary | ICD-10-CM | POA: Diagnosis not present

## 2021-03-24 DIAGNOSIS — M1A9XX1 Chronic gout, unspecified, with tophus (tophi): Secondary | ICD-10-CM | POA: Diagnosis not present

## 2021-03-29 ENCOUNTER — Other Ambulatory Visit: Payer: Self-pay

## 2021-03-29 DIAGNOSIS — I2729 Other secondary pulmonary hypertension: Secondary | ICD-10-CM | POA: Diagnosis not present

## 2021-03-29 DIAGNOSIS — J9611 Chronic respiratory failure with hypoxia: Secondary | ICD-10-CM | POA: Diagnosis not present

## 2021-03-29 DIAGNOSIS — I50813 Acute on chronic right heart failure: Secondary | ICD-10-CM | POA: Diagnosis not present

## 2021-03-29 DIAGNOSIS — I4821 Permanent atrial fibrillation: Secondary | ICD-10-CM | POA: Diagnosis not present

## 2021-03-29 DIAGNOSIS — I5043 Acute on chronic combined systolic (congestive) and diastolic (congestive) heart failure: Secondary | ICD-10-CM | POA: Diagnosis not present

## 2021-03-29 DIAGNOSIS — N1832 Chronic kidney disease, stage 3b: Secondary | ICD-10-CM | POA: Diagnosis not present

## 2021-03-29 DIAGNOSIS — E1122 Type 2 diabetes mellitus with diabetic chronic kidney disease: Secondary | ICD-10-CM | POA: Diagnosis not present

## 2021-03-29 DIAGNOSIS — I509 Heart failure, unspecified: Secondary | ICD-10-CM | POA: Diagnosis not present

## 2021-03-29 DIAGNOSIS — I130 Hypertensive heart and chronic kidney disease with heart failure and stage 1 through stage 4 chronic kidney disease, or unspecified chronic kidney disease: Secondary | ICD-10-CM | POA: Diagnosis not present

## 2021-03-29 DIAGNOSIS — I82412 Acute embolism and thrombosis of left femoral vein: Secondary | ICD-10-CM | POA: Diagnosis not present

## 2021-03-30 DIAGNOSIS — I50813 Acute on chronic right heart failure: Secondary | ICD-10-CM | POA: Diagnosis not present

## 2021-03-30 DIAGNOSIS — I4821 Permanent atrial fibrillation: Secondary | ICD-10-CM | POA: Diagnosis not present

## 2021-03-30 DIAGNOSIS — I2729 Other secondary pulmonary hypertension: Secondary | ICD-10-CM | POA: Diagnosis not present

## 2021-03-30 DIAGNOSIS — N1832 Chronic kidney disease, stage 3b: Secondary | ICD-10-CM | POA: Diagnosis not present

## 2021-03-30 DIAGNOSIS — I130 Hypertensive heart and chronic kidney disease with heart failure and stage 1 through stage 4 chronic kidney disease, or unspecified chronic kidney disease: Secondary | ICD-10-CM | POA: Diagnosis not present

## 2021-03-30 DIAGNOSIS — I82412 Acute embolism and thrombosis of left femoral vein: Secondary | ICD-10-CM | POA: Diagnosis not present

## 2021-03-30 DIAGNOSIS — E1122 Type 2 diabetes mellitus with diabetic chronic kidney disease: Secondary | ICD-10-CM | POA: Diagnosis not present

## 2021-03-30 DIAGNOSIS — I5043 Acute on chronic combined systolic (congestive) and diastolic (congestive) heart failure: Secondary | ICD-10-CM | POA: Diagnosis not present

## 2021-03-30 DIAGNOSIS — J9611 Chronic respiratory failure with hypoxia: Secondary | ICD-10-CM | POA: Diagnosis not present

## 2021-03-30 MED ORDER — METFORMIN HCL 1000 MG PO TABS: 1000.0000 mg | ORAL_TABLET | Freq: Every day | ORAL | 3 refills | Status: DC

## 2021-04-01 ENCOUNTER — Other Ambulatory Visit: Payer: Self-pay

## 2021-04-01 DIAGNOSIS — I4891 Unspecified atrial fibrillation: Secondary | ICD-10-CM

## 2021-04-01 DIAGNOSIS — I5042 Chronic combined systolic (congestive) and diastolic (congestive) heart failure: Secondary | ICD-10-CM

## 2021-04-01 NOTE — Telephone Encounter (Deleted)
Pt will also need an Rx for True Metrix Blood Glucose Mtr. Ottis Stain, CMA

## 2021-04-01 NOTE — Telephone Encounter (Addendum)
Received fax from [facility] mail delivery: They are requesting the following Rx and directions to be sent to them:  True Metrix Blood Glucose Mtr. [employer] True Metrix Test Strip TruePlu 33G lancets True Metrix Level 1 CTRL SOLN  Ottis Stain, CMA

## 2021-04-04 ENCOUNTER — Ambulatory Visit (INDEPENDENT_AMBULATORY_CARE_PROVIDER_SITE_OTHER): Payer: Medicare HMO | Admitting: *Deleted

## 2021-04-04 ENCOUNTER — Other Ambulatory Visit: Payer: Self-pay | Admitting: Family Medicine

## 2021-04-04 ENCOUNTER — Encounter: Payer: Self-pay | Admitting: Pharmacist

## 2021-04-04 DIAGNOSIS — I4891 Unspecified atrial fibrillation: Secondary | ICD-10-CM

## 2021-04-04 DIAGNOSIS — I2729 Other secondary pulmonary hypertension: Secondary | ICD-10-CM | POA: Diagnosis not present

## 2021-04-04 DIAGNOSIS — I130 Hypertensive heart and chronic kidney disease with heart failure and stage 1 through stage 4 chronic kidney disease, or unspecified chronic kidney disease: Secondary | ICD-10-CM | POA: Diagnosis not present

## 2021-04-04 DIAGNOSIS — J9611 Chronic respiratory failure with hypoxia: Secondary | ICD-10-CM | POA: Diagnosis not present

## 2021-04-04 DIAGNOSIS — I4821 Permanent atrial fibrillation: Secondary | ICD-10-CM | POA: Diagnosis not present

## 2021-04-04 DIAGNOSIS — I50813 Acute on chronic right heart failure: Secondary | ICD-10-CM | POA: Diagnosis not present

## 2021-04-04 DIAGNOSIS — I5043 Acute on chronic combined systolic (congestive) and diastolic (congestive) heart failure: Secondary | ICD-10-CM | POA: Diagnosis not present

## 2021-04-04 DIAGNOSIS — N1832 Chronic kidney disease, stage 3b: Secondary | ICD-10-CM | POA: Diagnosis not present

## 2021-04-04 DIAGNOSIS — E1122 Type 2 diabetes mellitus with diabetic chronic kidney disease: Secondary | ICD-10-CM | POA: Diagnosis not present

## 2021-04-04 DIAGNOSIS — I82412 Acute embolism and thrombosis of left femoral vein: Secondary | ICD-10-CM | POA: Diagnosis not present

## 2021-04-04 LAB — POCT INR
INR: 6 — AB (ref 2.0–3.0)
INR: 6 — AB (ref 2.0–3.0)

## 2021-04-04 NOTE — Progress Notes (Signed)
This encounter was created in error - please disregard.

## 2021-04-04 NOTE — Progress Notes (Signed)
Spoke with pt regarding insurance coverage for bilateral orthovisc injections.  He will be responsible for 20% of the cost of medicine. Prior Josem Kaufmann is required. Pt would like to think about it and call us back. If he decides to proceed with injections we will obtain prior auth.

## 2021-04-04 NOTE — Progress Notes (Signed)
Critical INR of 6.0 reported to Dr McDiarmid, reported by home health nurse. Patient is to hold his coumadin today and tomorrow. Repeat INR Wednesday. [name] Locklan Canoy

## 2021-04-05 ENCOUNTER — Other Ambulatory Visit: Payer: Self-pay

## 2021-04-05 DIAGNOSIS — I5043 Acute on chronic combined systolic (congestive) and diastolic (congestive) heart failure: Secondary | ICD-10-CM | POA: Diagnosis not present

## 2021-04-05 DIAGNOSIS — N1832 Chronic kidney disease, stage 3b: Secondary | ICD-10-CM | POA: Diagnosis not present

## 2021-04-05 DIAGNOSIS — J9611 Chronic respiratory failure with hypoxia: Secondary | ICD-10-CM | POA: Diagnosis not present

## 2021-04-05 DIAGNOSIS — I82412 Acute embolism and thrombosis of left femoral vein: Secondary | ICD-10-CM | POA: Diagnosis not present

## 2021-04-05 DIAGNOSIS — I130 Hypertensive heart and chronic kidney disease with heart failure and stage 1 through stage 4 chronic kidney disease, or unspecified chronic kidney disease: Secondary | ICD-10-CM | POA: Diagnosis not present

## 2021-04-05 DIAGNOSIS — E1122 Type 2 diabetes mellitus with diabetic chronic kidney disease: Secondary | ICD-10-CM | POA: Diagnosis not present

## 2021-04-05 DIAGNOSIS — I50813 Acute on chronic right heart failure: Secondary | ICD-10-CM | POA: Diagnosis not present

## 2021-04-05 DIAGNOSIS — I2729 Other secondary pulmonary hypertension: Secondary | ICD-10-CM | POA: Diagnosis not present

## 2021-04-05 DIAGNOSIS — I4821 Permanent atrial fibrillation: Secondary | ICD-10-CM | POA: Diagnosis not present

## 2021-04-06 ENCOUNTER — Other Ambulatory Visit: Payer: Self-pay | Admitting: *Deleted

## 2021-04-06 DIAGNOSIS — J9611 Chronic respiratory failure with hypoxia: Secondary | ICD-10-CM | POA: Diagnosis not present

## 2021-04-06 DIAGNOSIS — I4821 Permanent atrial fibrillation: Secondary | ICD-10-CM | POA: Diagnosis not present

## 2021-04-06 DIAGNOSIS — I82412 Acute embolism and thrombosis of left femoral vein: Secondary | ICD-10-CM | POA: Diagnosis not present

## 2021-04-06 DIAGNOSIS — I50813 Acute on chronic right heart failure: Secondary | ICD-10-CM | POA: Diagnosis not present

## 2021-04-06 DIAGNOSIS — I5043 Acute on chronic combined systolic (congestive) and diastolic (congestive) heart failure: Secondary | ICD-10-CM | POA: Diagnosis not present

## 2021-04-06 DIAGNOSIS — I130 Hypertensive heart and chronic kidney disease with heart failure and stage 1 through stage 4 chronic kidney disease, or unspecified chronic kidney disease: Secondary | ICD-10-CM | POA: Diagnosis not present

## 2021-04-06 DIAGNOSIS — I2729 Other secondary pulmonary hypertension: Secondary | ICD-10-CM | POA: Diagnosis not present

## 2021-04-06 DIAGNOSIS — E1122 Type 2 diabetes mellitus with diabetic chronic kidney disease: Secondary | ICD-10-CM | POA: Diagnosis not present

## 2021-04-06 DIAGNOSIS — N1832 Chronic kidney disease, stage 3b: Secondary | ICD-10-CM | POA: Diagnosis not present

## 2021-04-06 LAB — POCT INR: INR: 4 — AB (ref 2.0–3.0)

## 2021-04-06 NOTE — Progress Notes (Signed)
Continue to hold coumadin today, then take 7.5 mg tomorrow. Recheck INR at office visit Friday. [name] Moses Odoherty

## 2021-04-08 ENCOUNTER — Ambulatory Visit (INDEPENDENT_AMBULATORY_CARE_PROVIDER_SITE_OTHER): Payer: Medicare HMO | Admitting: Family Medicine

## 2021-04-08 ENCOUNTER — Other Ambulatory Visit: Payer: Self-pay

## 2021-04-08 VITALS — BP 117/70 | HR 79 | Ht 69.0 in | Wt 357.0 lb

## 2021-04-08 DIAGNOSIS — E1142 Type 2 diabetes mellitus with diabetic polyneuropathy: Secondary | ICD-10-CM | POA: Diagnosis not present

## 2021-04-08 DIAGNOSIS — I5042 Chronic combined systolic (congestive) and diastolic (congestive) heart failure: Secondary | ICD-10-CM

## 2021-04-08 DIAGNOSIS — J9611 Chronic respiratory failure with hypoxia: Secondary | ICD-10-CM

## 2021-04-08 DIAGNOSIS — I4891 Unspecified atrial fibrillation: Secondary | ICD-10-CM | POA: Diagnosis not present

## 2021-04-08 DIAGNOSIS — Z7901 Long term (current) use of anticoagulants: Secondary | ICD-10-CM | POA: Diagnosis not present

## 2021-04-08 DIAGNOSIS — R6889 Other general symptoms and signs: Secondary | ICD-10-CM | POA: Diagnosis not present

## 2021-04-08 LAB — POCT INR: INR: 2.6 (ref 2.0–3.0)

## 2021-04-08 MED ORDER — TORSEMIDE 40 MG PO TABS: 80.0000 mg | ORAL_TABLET | Freq: Every day | ORAL | 0 refills | Status: DC

## 2021-04-08 MED ORDER — ATORVASTATIN CALCIUM 20 MG PO TABS: 20.0000 mg | ORAL_TABLET | Freq: Every day | ORAL | 2 refills | Status: DC

## 2021-04-08 MED ORDER — WARFARIN SODIUM 2.5 MG PO TABS: ORAL_TABLET | ORAL | 0 refills | Status: DC

## 2021-04-08 NOTE — Patient Instructions (Addendum)
Your INR was 2.6 today. Going forward you should take 7.14m five days per week and 526mtwice per week (Monday and Thursday).  I do not feel it's good medical care to continue prescribing your Coumadin. Please call your cardiologist to inquire about options for coumadin prescribing. [facility] believe [facility] [place] cardi[place] can guide you. I will prescribe a 1 month supply in the interim.  We are starting a new medication called Atorvastatin today. This is a medication that everyone with diabetes should take to help protect their heart. It does not interact with your Adempas.   Take care and seek immediate care sooner if you develop any concerns.  Dr. [facility]

## 2021-04-08 NOTE — Progress Notes (Signed)
    SUBJECTIVE:   CHIEF COMPLAINT / HPI:   Anticoagulation Management Patient on anticoagulation due to a-fib and hx of DVT. Currently on Coumadin, which was started while in the hospital several months ago. Patient did not tolerate Eliquis or Xarelto in the past due to nose bleeds and hemoptysis. Most recent regimen: 7.87m four times/week and 1235mthree times/week. INR elevated to 6.0 on 04/04/21. Recheck on 04/06/21 was 4.0. He has been holding his Coumadin since the 11th.  Type 2 Diabetes Last A1c 8.7% in May 2022 Takes Metformin 106979YIaily and Trulicity 0.0.16PVeekly. Reports appropriate medication compliance. States he occasionally checks his sugars and they are usually "good" (104, 99). Endorses poor dietary habits- frequently drinks milkshakes.  Pulmonary HTN Follows with pulmonologist. They are starting a new medication called Adempas.   Hand Nodule Reports he saw the hand surgeon. They won't do surgery until his A1c is improved. Was also told the smaller bumps on his hands are gouty tophi and is wondering if he needs med for gout. No gout flares in his joints.  PERTINENT  PMH / PSH: T2DM, combined CHF, pulmonary HTN, DVT, a-fib, HTN, obesity  OBJECTIVE:   BP 117/70   Pulse 79   Ht _0  (1.753 m)   Wt (!) 357 lb (161.9 kg)   SpO2 92% Comment: _1   BMI 52.72 kg/m   General: NAD, pleasant, able to participate in exam Cardiac: RRR, S1 S2 present. normal heart sounds Respiratory: normal effort on 4L Neuro: alert, no obvious focal deficits Psych: Normal affect and mood   ASSESSMENT/PLAN:   Warfarin anticoagulation INR 2.6 today (within goal range of 2-3). Will resume Coumadin at 20% reduced dose. Plan going forward: 35m24mn Mon/Thurs and 7.35mg52m remaining days. Home health nurse will recheck INR in 5 days (7/20). Had long discussion with patient about risks/benefits of Coumadin therapy. Given that his INR has been supratherapeutic essentially every time it's checked,  and patient has not always been compliant with regular INR monitoring, the risks outweigh the benefits and I do not feel it's safe to continue prescribing his Coumadin going forward. Patient not interested in trying DOAC again. Discussed risks of life-threatening bleeding with Coumadin and supratherapeutic INR. He will contact his cardiologist and try to find alternate prescriber. Given 30 day supply in the interim.   Chronic respiratory failure with hypoxia (HCC) Secondary to severe pulmonary HTN and combined systolic/diastolic CHF. Followed by pulmonology.  Stable on 4L O2. Patient requires continued long term oxygen therapy.  Type 2 diabetes mellitus with diabetic polyneuropathy, without long-term current use of insulin (HCC) Last A1c 8.7% two months ago. -Continue current regimen (Metformin and Trulicity) -Start Atorvastatin 20mg68mly -Patient motivated to work on dietary changes, as hand Copyt remove his thumb nodule unless A1c is improved -Recheck A1c in 1 month. If persistently elevated, discuss adding SGLT2    AshleAlcus DadCone [place]

## 2021-04-09 NOTE — Assessment & Plan Note (Signed)
Secondary to severe pulmonary HTN and combined systolic/diastolic CHF. Followed by pulmonology.  Stable on 4L O2. Patient requires continued long term oxygen therapy.

## 2021-04-09 NOTE — Assessment & Plan Note (Signed)
INR 2.6 today (within goal range of 2-3). Will resume Coumadin at 20% reduced dose. Plan going forward: 5m on Mon/Thurs and 7.530mon remaining days. Home health nurse will recheck INR in 5 days (7/20). Had long discussion with patient about risks/benefits of Coumadin therapy. Given that his INR has been supratherapeutic essentially every time it's checked, and patient has not always been compliant with regular INR monitoring, the risks outweigh the benefits and I do not feel it's safe to continue prescribing his Coumadin going forward. Patient not interested in trying DOAC again. Discussed risks of life-threatening bleeding with Coumadin and supratherapeutic INR. He will contact his cardiologist and try to find alternate prescriber. Given 30 day supply in the interim.

## 2021-04-09 NOTE — Assessment & Plan Note (Signed)
Last A1c 8.7% two months ago. -Continue current regimen (Metformin and Trulicity) -Start Atorvastatin 38m daily -Patient motivated to work on dietary changes, as hCopywon't remove his thumb nodule unless A1c is improved -Recheck A1c in 1 month. If persistently elevated, discuss adding SGLT2

## 2021-04-12 ENCOUNTER — Telehealth: Payer: Self-pay

## 2021-04-12 DIAGNOSIS — E1142 Type 2 diabetes mellitus with diabetic polyneuropathy: Secondary | ICD-10-CM

## 2021-04-12 NOTE — Telephone Encounter (Signed)
Fax from [facility] Requesting refill of the following. Did not see on med list.  [employer] True Metrix test strip TruePlus 33G Lancets True Metrix Level 1 CTRL SOLN  Ottis Stain, CMA

## 2021-04-13 ENCOUNTER — Ambulatory Visit: Payer: Self-pay | Admitting: *Deleted

## 2021-04-13 ENCOUNTER — Other Ambulatory Visit: Payer: Self-pay | Admitting: Family Medicine

## 2021-04-13 DIAGNOSIS — I2729 Other secondary pulmonary hypertension: Secondary | ICD-10-CM | POA: Diagnosis not present

## 2021-04-13 DIAGNOSIS — E1122 Type 2 diabetes mellitus with diabetic chronic kidney disease: Secondary | ICD-10-CM | POA: Diagnosis not present

## 2021-04-13 DIAGNOSIS — N1832 Chronic kidney disease, stage 3b: Secondary | ICD-10-CM | POA: Diagnosis not present

## 2021-04-13 DIAGNOSIS — I4821 Permanent atrial fibrillation: Secondary | ICD-10-CM | POA: Diagnosis not present

## 2021-04-13 DIAGNOSIS — I5043 Acute on chronic combined systolic (congestive) and diastolic (congestive) heart failure: Secondary | ICD-10-CM | POA: Diagnosis not present

## 2021-04-13 DIAGNOSIS — I5042 Chronic combined systolic (congestive) and diastolic (congestive) heart failure: Secondary | ICD-10-CM

## 2021-04-13 DIAGNOSIS — I50813 Acute on chronic right heart failure: Secondary | ICD-10-CM | POA: Diagnosis not present

## 2021-04-13 DIAGNOSIS — I130 Hypertensive heart and chronic kidney disease with heart failure and stage 1 through stage 4 chronic kidney disease, or unspecified chronic kidney disease: Secondary | ICD-10-CM | POA: Diagnosis not present

## 2021-04-13 DIAGNOSIS — I82412 Acute embolism and thrombosis of left femoral vein: Secondary | ICD-10-CM | POA: Diagnosis not present

## 2021-04-13 DIAGNOSIS — J9611 Chronic respiratory failure with hypoxia: Secondary | ICD-10-CM | POA: Diagnosis not present

## 2021-04-13 LAB — POCT INR: INR: 2.5 (ref 2.0–3.0)

## 2021-04-14 ENCOUNTER — Other Ambulatory Visit: Payer: Self-pay | Admitting: Family Medicine

## 2021-04-14 DIAGNOSIS — Z7901 Long term (current) use of anticoagulants: Secondary | ICD-10-CM

## 2021-04-14 MED ORDER — TORSEMIDE 20 MG PO TABS: 80.0000 mg | ORAL_TABLET | Freq: Every day | ORAL | 2 refills | Status: DC

## 2021-04-14 NOTE — Progress Notes (Signed)
[facility] will manage his Coumadin going forward per patient's home health nurse. Referral placed.  Also placed new referral for home health RN. His prior order expired yesterday (04/13/21) and he needs INR checked on 04/20/2021.

## 2021-04-15 MED ORDER — TRUEPLUS LANCETS 33G MISC: 12 refills | Status: DC

## 2021-04-15 MED ORDER — TRUE METRIX BLOOD GLUCOSE TEST VI STRP: ORAL_STRIP | 12 refills | Status: DC

## 2021-04-15 MED ORDER — TRUE METRIX LEVEL 1 LOW VI SOLN: 6 refills | Status: DC

## 2021-04-15 NOTE — Addendum Note (Signed)
Addended by: Alcus Dad on: 04/15/2021 03:59 PM   Modules accepted: Orders

## 2021-04-15 NOTE — Telephone Encounter (Signed)
Sent to pharmacy as requested

## 2021-04-20 ENCOUNTER — Ambulatory Visit: Payer: Medicare HMO

## 2021-04-20 DIAGNOSIS — I50813 Acute on chronic right heart failure: Secondary | ICD-10-CM | POA: Diagnosis not present

## 2021-04-20 DIAGNOSIS — R5383 Other fatigue: Secondary | ICD-10-CM | POA: Diagnosis not present

## 2021-04-20 DIAGNOSIS — I5043 Acute on chronic combined systolic (congestive) and diastolic (congestive) heart failure: Secondary | ICD-10-CM | POA: Diagnosis not present

## 2021-04-20 DIAGNOSIS — E1122 Type 2 diabetes mellitus with diabetic chronic kidney disease: Secondary | ICD-10-CM | POA: Diagnosis not present

## 2021-04-20 DIAGNOSIS — I130 Hypertensive heart and chronic kidney disease with heart failure and stage 1 through stage 4 chronic kidney disease, or unspecified chronic kidney disease: Secondary | ICD-10-CM | POA: Diagnosis not present

## 2021-04-20 DIAGNOSIS — M170 Bilateral primary osteoarthritis of knee: Secondary | ICD-10-CM | POA: Diagnosis not present

## 2021-04-20 DIAGNOSIS — N1832 Chronic kidney disease, stage 3b: Secondary | ICD-10-CM | POA: Diagnosis not present

## 2021-04-20 DIAGNOSIS — I2729 Other secondary pulmonary hypertension: Secondary | ICD-10-CM | POA: Diagnosis not present

## 2021-04-20 DIAGNOSIS — U071 COVID-19: Secondary | ICD-10-CM | POA: Diagnosis not present

## 2021-04-21 ENCOUNTER — Telehealth: Payer: Self-pay | Admitting: Family Medicine

## 2021-04-21 DIAGNOSIS — R600 Localized edema: Secondary | ICD-10-CM | POA: Diagnosis not present

## 2021-04-21 DIAGNOSIS — J811 Chronic pulmonary edema: Secondary | ICD-10-CM | POA: Diagnosis not present

## 2021-04-21 DIAGNOSIS — R0602 Shortness of breath: Secondary | ICD-10-CM | POA: Diagnosis not present

## 2021-04-21 DIAGNOSIS — I4891 Unspecified atrial fibrillation: Secondary | ICD-10-CM

## 2021-04-21 DIAGNOSIS — R609 Edema, unspecified: Secondary | ICD-10-CM | POA: Diagnosis not present

## 2021-04-21 DIAGNOSIS — R911 Solitary pulmonary nodule: Secondary | ICD-10-CM | POA: Diagnosis not present

## 2021-04-21 DIAGNOSIS — I509 Heart failure, unspecified: Secondary | ICD-10-CM | POA: Diagnosis not present

## 2021-04-21 DIAGNOSIS — I110 Hypertensive heart disease with heart failure: Secondary | ICD-10-CM | POA: Diagnosis not present

## 2021-04-21 DIAGNOSIS — R0989 Other specified symptoms and signs involving the circulatory and respiratory systems: Secondary | ICD-10-CM | POA: Diagnosis not present

## 2021-04-21 DIAGNOSIS — J449 Chronic obstructive pulmonary disease, unspecified: Secondary | ICD-10-CM | POA: Diagnosis not present

## 2021-04-21 DIAGNOSIS — I517 Cardiomegaly: Secondary | ICD-10-CM | POA: Diagnosis not present

## 2021-04-21 DIAGNOSIS — Z7984 Long term (current) use of oral hypoglycemic drugs: Secondary | ICD-10-CM | POA: Diagnosis not present

## 2021-04-21 DIAGNOSIS — R3589 Other polyuria: Secondary | ICD-10-CM | POA: Diagnosis not present

## 2021-04-21 DIAGNOSIS — E119 Type 2 diabetes mellitus without complications: Secondary | ICD-10-CM | POA: Diagnosis not present

## 2021-04-21 MED ORDER — WARFARIN SODIUM 2.5 MG PO TABS: ORAL_TABLET | ORAL | 0 refills | Status: DC

## 2021-04-21 NOTE — Telephone Encounter (Signed)
Called patient to discuss his Warfarin management. Home health nurse will be checking his INR tomorrow, 7/29. Verbal orders were given to home health agency via telephone this morning.  Additionally, patient has an appointment scheduled at [facility] with Dr. Domenic Polite on September 7th at 1:20pm. Patient informed of this appointment. Informed that we would continue to manage his Coumadin in conjunction with the home health nurse in the meantime. Additional refills will be provided as needed.   He was somewhat frustrated and states he has been coming to [facility] for 8 years without issue until he started seeing me and was started on the Eliquis. Reports he is now dealing with a lot of health problems. Offered support and reassured him that we want to provide the best care for him. Offered an opportunity to speak with my supervising doctor but he declined.

## 2021-04-22 ENCOUNTER — Ambulatory Visit (INDEPENDENT_AMBULATORY_CARE_PROVIDER_SITE_OTHER): Payer: Medicare HMO | Admitting: *Deleted

## 2021-04-22 DIAGNOSIS — I517 Cardiomegaly: Secondary | ICD-10-CM | POA: Diagnosis not present

## 2021-04-22 DIAGNOSIS — I50813 Acute on chronic right heart failure: Secondary | ICD-10-CM | POA: Diagnosis not present

## 2021-04-22 DIAGNOSIS — M170 Bilateral primary osteoarthritis of knee: Secondary | ICD-10-CM | POA: Diagnosis not present

## 2021-04-22 DIAGNOSIS — J811 Chronic pulmonary edema: Secondary | ICD-10-CM | POA: Diagnosis not present

## 2021-04-22 DIAGNOSIS — I130 Hypertensive heart and chronic kidney disease with heart failure and stage 1 through stage 4 chronic kidney disease, or unspecified chronic kidney disease: Secondary | ICD-10-CM | POA: Diagnosis not present

## 2021-04-22 DIAGNOSIS — I2729 Other secondary pulmonary hypertension: Secondary | ICD-10-CM | POA: Diagnosis not present

## 2021-04-22 DIAGNOSIS — U071 COVID-19: Secondary | ICD-10-CM | POA: Diagnosis not present

## 2021-04-22 DIAGNOSIS — I5043 Acute on chronic combined systolic (congestive) and diastolic (congestive) heart failure: Secondary | ICD-10-CM | POA: Diagnosis not present

## 2021-04-22 DIAGNOSIS — N1832 Chronic kidney disease, stage 3b: Secondary | ICD-10-CM | POA: Diagnosis not present

## 2021-04-22 DIAGNOSIS — R0602 Shortness of breath: Secondary | ICD-10-CM | POA: Diagnosis not present

## 2021-04-22 DIAGNOSIS — E1122 Type 2 diabetes mellitus with diabetic chronic kidney disease: Secondary | ICD-10-CM | POA: Diagnosis not present

## 2021-04-22 DIAGNOSIS — R5383 Other fatigue: Secondary | ICD-10-CM | POA: Diagnosis not present

## 2021-04-22 LAB — POCT INR: INR: 3.8 — AB (ref 2.0–3.0)

## 2021-04-22 NOTE — Progress Notes (Signed)
Dosed by Dr Ky Barban.

## 2021-04-25 ENCOUNTER — Telehealth: Payer: Self-pay

## 2021-04-25 NOTE — Telephone Encounter (Signed)
Pt called back.   Pt scheduled for 8.16.22 at 4pm

## 2021-04-25 NOTE — Telephone Encounter (Signed)
LVM to have pt call back to schedule AWV.   RE: confirm insurance and schedule AWV on my schedule if times are convenient for patient or other AWV schedule as template permits.

## 2021-04-26 DIAGNOSIS — R2243 Localized swelling, mass and lump, lower limb, bilateral: Secondary | ICD-10-CM | POA: Diagnosis not present

## 2021-04-26 DIAGNOSIS — I517 Cardiomegaly: Secondary | ICD-10-CM | POA: Diagnosis not present

## 2021-04-26 DIAGNOSIS — M7989 Other specified soft tissue disorders: Secondary | ICD-10-CM | POA: Diagnosis not present

## 2021-04-26 DIAGNOSIS — R0902 Hypoxemia: Secondary | ICD-10-CM | POA: Diagnosis not present

## 2021-04-26 DIAGNOSIS — E876 Hypokalemia: Secondary | ICD-10-CM | POA: Diagnosis not present

## 2021-04-26 DIAGNOSIS — U071 COVID-19: Secondary | ICD-10-CM | POA: Diagnosis not present

## 2021-04-27 ENCOUNTER — Other Ambulatory Visit: Payer: Self-pay | Admitting: Family Medicine

## 2021-04-27 DIAGNOSIS — M1830 Unilateral post-traumatic osteoarthritis of first carpometacarpal joint, unspecified hand: Secondary | ICD-10-CM | POA: Diagnosis not present

## 2021-04-27 DIAGNOSIS — I272 Pulmonary hypertension, unspecified: Secondary | ICD-10-CM | POA: Diagnosis not present

## 2021-04-27 DIAGNOSIS — I4821 Permanent atrial fibrillation: Secondary | ICD-10-CM | POA: Diagnosis not present

## 2021-04-27 DIAGNOSIS — G4733 Obstructive sleep apnea (adult) (pediatric): Secondary | ICD-10-CM | POA: Diagnosis not present

## 2021-04-27 DIAGNOSIS — J9610 Chronic respiratory failure, unspecified whether with hypoxia or hypercapnia: Secondary | ICD-10-CM | POA: Diagnosis not present

## 2021-04-27 DIAGNOSIS — U071 COVID-19: Secondary | ICD-10-CM | POA: Diagnosis not present

## 2021-04-27 DIAGNOSIS — E1122 Type 2 diabetes mellitus with diabetic chronic kidney disease: Secondary | ICD-10-CM | POA: Diagnosis not present

## 2021-04-27 DIAGNOSIS — R601 Generalized edema: Secondary | ICD-10-CM | POA: Diagnosis not present

## 2021-04-27 DIAGNOSIS — R778 Other specified abnormalities of plasma proteins: Secondary | ICD-10-CM | POA: Diagnosis not present

## 2021-04-28 DIAGNOSIS — E8770 Fluid overload, unspecified: Secondary | ICD-10-CM | POA: Diagnosis not present

## 2021-04-28 DIAGNOSIS — J9610 Chronic respiratory failure, unspecified whether with hypoxia or hypercapnia: Secondary | ICD-10-CM | POA: Diagnosis not present

## 2021-04-28 DIAGNOSIS — G4733 Obstructive sleep apnea (adult) (pediatric): Secondary | ICD-10-CM | POA: Diagnosis not present

## 2021-04-28 DIAGNOSIS — I272 Pulmonary hypertension, unspecified: Secondary | ICD-10-CM | POA: Diagnosis not present

## 2021-04-28 DIAGNOSIS — E1122 Type 2 diabetes mellitus with diabetic chronic kidney disease: Secondary | ICD-10-CM | POA: Diagnosis not present

## 2021-04-28 DIAGNOSIS — R601 Generalized edema: Secondary | ICD-10-CM | POA: Diagnosis not present

## 2021-04-28 DIAGNOSIS — R778 Other specified abnormalities of plasma proteins: Secondary | ICD-10-CM | POA: Diagnosis not present

## 2021-04-28 DIAGNOSIS — U071 COVID-19: Secondary | ICD-10-CM | POA: Diagnosis not present

## 2021-04-28 DIAGNOSIS — N183 Chronic kidney disease, stage 3 unspecified: Secondary | ICD-10-CM | POA: Diagnosis not present

## 2021-04-29 DIAGNOSIS — E876 Hypokalemia: Secondary | ICD-10-CM | POA: Diagnosis not present

## 2021-04-29 DIAGNOSIS — R601 Generalized edema: Secondary | ICD-10-CM | POA: Diagnosis not present

## 2021-04-29 DIAGNOSIS — I272 Pulmonary hypertension, unspecified: Secondary | ICD-10-CM | POA: Diagnosis not present

## 2021-04-29 DIAGNOSIS — R778 Other specified abnormalities of plasma proteins: Secondary | ICD-10-CM | POA: Diagnosis not present

## 2021-04-29 DIAGNOSIS — E8770 Fluid overload, unspecified: Secondary | ICD-10-CM | POA: Diagnosis not present

## 2021-04-29 DIAGNOSIS — J9610 Chronic respiratory failure, unspecified whether with hypoxia or hypercapnia: Secondary | ICD-10-CM | POA: Diagnosis not present

## 2021-04-29 DIAGNOSIS — I4821 Permanent atrial fibrillation: Secondary | ICD-10-CM | POA: Diagnosis not present

## 2021-04-29 DIAGNOSIS — J9611 Chronic respiratory failure with hypoxia: Secondary | ICD-10-CM | POA: Diagnosis not present

## 2021-04-29 DIAGNOSIS — I509 Heart failure, unspecified: Secondary | ICD-10-CM | POA: Diagnosis not present

## 2021-04-29 DIAGNOSIS — N183 Chronic kidney disease, stage 3 unspecified: Secondary | ICD-10-CM | POA: Diagnosis not present

## 2021-04-29 DIAGNOSIS — E1122 Type 2 diabetes mellitus with diabetic chronic kidney disease: Secondary | ICD-10-CM | POA: Diagnosis not present

## 2021-04-30 DIAGNOSIS — R601 Generalized edema: Secondary | ICD-10-CM | POA: Diagnosis not present

## 2021-04-30 DIAGNOSIS — E1122 Type 2 diabetes mellitus with diabetic chronic kidney disease: Secondary | ICD-10-CM | POA: Diagnosis not present

## 2021-04-30 DIAGNOSIS — I272 Pulmonary hypertension, unspecified: Secondary | ICD-10-CM | POA: Diagnosis not present

## 2021-04-30 DIAGNOSIS — R778 Other specified abnormalities of plasma proteins: Secondary | ICD-10-CM | POA: Diagnosis not present

## 2021-04-30 DIAGNOSIS — I4821 Permanent atrial fibrillation: Secondary | ICD-10-CM | POA: Diagnosis not present

## 2021-04-30 DIAGNOSIS — E876 Hypokalemia: Secondary | ICD-10-CM | POA: Diagnosis not present

## 2021-04-30 DIAGNOSIS — U071 COVID-19: Secondary | ICD-10-CM | POA: Diagnosis not present

## 2021-04-30 DIAGNOSIS — E8770 Fluid overload, unspecified: Secondary | ICD-10-CM | POA: Diagnosis not present

## 2021-04-30 DIAGNOSIS — J9611 Chronic respiratory failure with hypoxia: Secondary | ICD-10-CM | POA: Diagnosis not present

## 2021-05-01 DIAGNOSIS — N183 Chronic kidney disease, stage 3 unspecified: Secondary | ICD-10-CM | POA: Diagnosis not present

## 2021-05-01 DIAGNOSIS — E1122 Type 2 diabetes mellitus with diabetic chronic kidney disease: Secondary | ICD-10-CM | POA: Diagnosis not present

## 2021-05-01 DIAGNOSIS — R778 Other specified abnormalities of plasma proteins: Secondary | ICD-10-CM | POA: Diagnosis not present

## 2021-05-01 DIAGNOSIS — J9611 Chronic respiratory failure with hypoxia: Secondary | ICD-10-CM | POA: Diagnosis not present

## 2021-05-01 DIAGNOSIS — E8770 Fluid overload, unspecified: Secondary | ICD-10-CM | POA: Diagnosis not present

## 2021-05-01 DIAGNOSIS — I272 Pulmonary hypertension, unspecified: Secondary | ICD-10-CM | POA: Diagnosis not present

## 2021-05-01 DIAGNOSIS — U071 COVID-19: Secondary | ICD-10-CM | POA: Diagnosis not present

## 2021-05-01 DIAGNOSIS — R601 Generalized edema: Secondary | ICD-10-CM | POA: Diagnosis not present

## 2021-05-01 DIAGNOSIS — I4821 Permanent atrial fibrillation: Secondary | ICD-10-CM | POA: Diagnosis not present

## 2021-05-02 DIAGNOSIS — R601 Generalized edema: Secondary | ICD-10-CM | POA: Diagnosis not present

## 2021-05-02 DIAGNOSIS — I272 Pulmonary hypertension, unspecified: Secondary | ICD-10-CM | POA: Diagnosis not present

## 2021-05-02 DIAGNOSIS — U071 COVID-19: Secondary | ICD-10-CM | POA: Diagnosis not present

## 2021-05-02 DIAGNOSIS — E1122 Type 2 diabetes mellitus with diabetic chronic kidney disease: Secondary | ICD-10-CM | POA: Diagnosis not present

## 2021-05-02 DIAGNOSIS — I50812 Chronic right heart failure: Secondary | ICD-10-CM | POA: Diagnosis not present

## 2021-05-02 DIAGNOSIS — I130 Hypertensive heart and chronic kidney disease with heart failure and stage 1 through stage 4 chronic kidney disease, or unspecified chronic kidney disease: Secondary | ICD-10-CM | POA: Diagnosis not present

## 2021-05-02 DIAGNOSIS — N183 Chronic kidney disease, stage 3 unspecified: Secondary | ICD-10-CM | POA: Diagnosis not present

## 2021-05-02 DIAGNOSIS — I4821 Permanent atrial fibrillation: Secondary | ICD-10-CM | POA: Diagnosis not present

## 2021-05-09 ENCOUNTER — Ambulatory Visit: Payer: Self-pay | Admitting: *Deleted

## 2021-05-09 DIAGNOSIS — I50813 Acute on chronic right heart failure: Secondary | ICD-10-CM | POA: Diagnosis not present

## 2021-05-09 DIAGNOSIS — I130 Hypertensive heart and chronic kidney disease with heart failure and stage 1 through stage 4 chronic kidney disease, or unspecified chronic kidney disease: Secondary | ICD-10-CM | POA: Diagnosis not present

## 2021-05-09 DIAGNOSIS — N1832 Chronic kidney disease, stage 3b: Secondary | ICD-10-CM | POA: Diagnosis not present

## 2021-05-09 DIAGNOSIS — R5383 Other fatigue: Secondary | ICD-10-CM | POA: Diagnosis not present

## 2021-05-09 DIAGNOSIS — E1122 Type 2 diabetes mellitus with diabetic chronic kidney disease: Secondary | ICD-10-CM | POA: Diagnosis not present

## 2021-05-09 DIAGNOSIS — M170 Bilateral primary osteoarthritis of knee: Secondary | ICD-10-CM | POA: Diagnosis not present

## 2021-05-09 DIAGNOSIS — U071 COVID-19: Secondary | ICD-10-CM | POA: Diagnosis not present

## 2021-05-09 DIAGNOSIS — I2729 Other secondary pulmonary hypertension: Secondary | ICD-10-CM | POA: Diagnosis not present

## 2021-05-09 DIAGNOSIS — I5043 Acute on chronic combined systolic (congestive) and diastolic (congestive) heart failure: Secondary | ICD-10-CM | POA: Diagnosis not present

## 2021-05-09 LAB — POCT INR: INR: 8 — AB (ref 2.0–3.0)

## 2021-05-09 NOTE — Progress Notes (Signed)
Home health nurse called in INR result of 8.0 on Mr Roberto [name]. Discussed critical result with Dr Nori Riis. Patient is to hold coumadin today through Thursday. INR is to be repeated on Friday. Discussed dosing and follow up instructions with home health nurse. Also advised her to discuss with patient to go ER if he shows any signs of bruising or bleeding. [name] Jahid Weida

## 2021-05-09 NOTE — Progress Notes (Signed)
Appt was rescheduled for the following day.

## 2021-05-10 ENCOUNTER — Encounter (INDEPENDENT_AMBULATORY_CARE_PROVIDER_SITE_OTHER): Payer: Medicare HMO

## 2021-05-10 DIAGNOSIS — Z0000 Encounter for general adult medical examination without abnormal findings: Secondary | ICD-10-CM

## 2021-05-11 ENCOUNTER — Ambulatory Visit (INDEPENDENT_AMBULATORY_CARE_PROVIDER_SITE_OTHER): Payer: Medicare HMO

## 2021-05-11 VITALS — Ht 69.0 in | Wt 355.0 lb

## 2021-05-11 DIAGNOSIS — Z0000 Encounter for general adult medical examination without abnormal findings: Secondary | ICD-10-CM

## 2021-05-11 NOTE — Progress Notes (Signed)
I have reviewed this visit and agree with the documentation.

## 2021-05-11 NOTE — Patient Instructions (Addendum)
You spoke to Roberto [name], [place] over the phone for your annual wellness visit.  We discussed goals:   Goals       Acknowledge receipt of Advanced Directive package     Weight (lb) < 280 lb (127 kg)        We also discussed recommended health maintenance. Please call our office and schedule a visit. As discussed, you are due for:  Health Maintenance  Topic Date Due   PNEUMOCOCCAL POLYSACCHARIDE VACCINE AGE 61-64 HIGH RISK  Never done   Pneumococcal Vaccine 57-37 Years old (1 - PCV) Never done   FOOT EXAM  Never done   OPHTHALMOLOGY EXAM  Never done   URINE MICROALBUMIN  Never done   Zoster Vaccines- Shingrix (1 of 2) Never done   COVID-19 Vaccine (3 - Moderna risk series) 03/09/2020   INFLUENZA VACCINE  04/25/2021   HEMOGLOBIN A1C  08/05/2021   COLONOSCOPY (Pts 45-54yr Insurance coverage will need to be confirmed)  09/16/2021   TETANUS/TDAP  04/27/2031   Hepatitis C Screening  Completed   HIV Screening  Completed   HPV VACCINES  Aged Out   PCP apt declined- [place] [facility] [place] [facility] for [facility].  Please call u[place]ore[place]ith result.  You are due for Covid Booser.  Please fill out advance directive packet.   Health Maintenance, Male Adopting a healthy lifestyle and getting preventive care are important in promoting health and wellness. Ask your health care provider about: The right schedule for you to have regular tests and exams. Things you can do on your own to prevent diseases and keep yourself healthy. What should I know about diet, weight, and exercise? Eat a healthy diet  Eat a diet that includes plenty of vegetables, fruits, low-fat dairy products, and lean protein. Do not eat a lot of foods that are high in solid fats, added sugars, or sodium.  Maintain a healthy weight Body mass index (BMI) is a measurement that can be used to identify possible weight problems. It estimates body fat based on height and weight. Your health care  provider can help determine your BMI and help you achieve or maintain ahealthy weight. Get regular exercise Get regular exercise. This is one of the most important things you can do for your health. Most adults should: Exercise for at least 150 minutes each week. The exercise should increase your heart rate and make you sweat (moderate-intensity exercise). Do strengthening exercises at least twice a week. This is in addition to the moderate-intensity exercise. Spend less time sitting. Even light physical activity can be beneficial. Watch cholesterol and blood lipids Have your blood tested for lipids and cholesterol at 61years of age, then havethis test every 5 years. You may need to have your cholesterol levels checked more often if: Your lipid or cholesterol levels are high. You are older than 61years of age. You are at high risk for heart disease. What should I know about cancer screening? Many types of cancers can be detected early and may often be prevented. Depending on your health history and family history, you may need to have cancer screening at various ages. This may include screening for: Colorectal cancer. Prostate cancer. Skin cancer. Lung cancer. What should I know about heart disease, diabetes, and high blood pressure? Blood pressure and heart disease High blood pressure causes heart disease and increases the risk of stroke. This is more likely to develop in people  who have high blood pressure readings, are of African descent, or are overweight. Talk with your health care provider about your target blood pressure readings. Have your blood pressure checked: Every 3-5 years if you are 33-7 years of age. Every year if you are 48 years old or older. If you are between the ages of 12 and 55 and are a current or former smoker, ask your health care provider if you should have a one-time screening for abdominal aortic aneurysm (AAA). Diabetes Have regular diabetes screenings.  This checks your fasting blood sugar level. Have the screening done: Once every three years after age 55 if you are at a normal weight and have a low risk for diabetes. More often and at a younger age if you are overweight or have a high risk for diabetes. What should I know about preventing infection? Hepatitis B If you have a higher risk for hepatitis B, you should be screened for this virus. Talk with your health care provider to find out if you are at risk forhepatitis B infection. Hepatitis C Blood testing is recommended for: Everyone born from [birth year] through 1966. Anyone with known risk factors for hepatitis C. Sexually transmitted infections (STIs) You should be screened each year for STIs, including gonorrhea and chlamydia, if: You are sexually active and are younger than 61 years of age. You are older than 61 years of age and your health care provider tells you that you are at risk for this type of infection. Your sexual activity has changed since you were last screened, and you are at increased risk for chlamydia or gonorrhea. Ask your health care provider if you are at risk. Ask your health care provider about whether you are at high risk for HIV. Your health care provider may recommend a prescription medicine to help prevent HIV infection. If you choose to take medicine to prevent HIV, you should first get tested for HIV. You should then be tested every 3 months for as long as you are taking the medicine. Follow these instructions at home: Lifestyle Do not use any products that contain nicotine or tobacco, such as cigarettes, e-cigarettes, and chewing tobacco. If you need help quitting, ask your health care provider. Do not use street drugs. Do not share needles. Ask your health care provider for help if you need support or information about quitting drugs. Alcohol use Do not drink alcohol if your health care provider tells you not to drink. If you drink alcohol: Limit how much you  have to 0-2 drinks a day. Be aware of how much alcohol is in your drink. In the [place] one drink equals one 12 oz bottle of beer (355 mL), one 5 oz glass of wine (148 mL), or one 1 oz glass of hard liquor (44 mL). General instructions Schedule regular health, dental, and eye exams. Stay current with your vaccines. Tell your health care provider if: You often feel depressed. You have ever been abused or do not feel safe at home. Summary Adopting a healthy lifestyle and getting preventive care are important in promoting health and wellness. Follow your health care provider's instructions about healthy diet, exercising, and getting tested or screened for diseases. Follow your health care provider's instructions on monitoring your cholesterol and blood pressure. This information is not intended to replace advice given to you by your health care provider. Make sure you discuss any questions you have with your healthcare provider. Document Revised: 09/04/2018 Document Reviewed: 09/04/2018 Elsevier Patient Education  2022  [place].   Diet Recommendations for Diabetes   1. Eat at least 3 meals and 1-2 snacks per day. Never go more than 4-5 hours while awake without eating. Eat breakfast within the first hour of getting up.   2. Limit starchy foods to TWO per meal and ONE per snack. ONE portion of a starchy  food is equal to the following:   - ONE slice of bread (or its equivalent, such as half of a hamburger bun).   - 1/2 cup of a "scoopable" starchy food such as potatoes or rice.   - 15 grams of Total Carbohydrate as shown on food label.  3. Include at every meal: a protein food, a carb food, and vegetables and/or fruit.   - Obtain twice the volume of vegetables as protein or carbohydrate foods for both lunch and dinner.   - Fresh or frozen vegetables are best.   - Keep frozen vegetables on hand for a quick vegetable serving.       Starchy (carb) foods: Bread, rice, pasta, potatoes, corn,  cereal, grits, crackers, bagels, muffins, all baked goods.  (Fruits, milk, and yogurt also have carbohydrate, but most of these foods will not spike your blood sugar as most starchy foods will.)  A few fruits do cause high blood sugars; use small portions of bananas (limit to 1/2 at a time), grapes, watermelon, oranges, and most tropical fruits.    Protein foods: Meat, fish, poultry, eggs, dairy foods, and beans such as pinto and kidney beans (beans also provide carbohydrate).    Our clinic's number is [phone]. Please call with questions or concerns about what we discussed today.

## 2021-05-11 NOTE — Progress Notes (Signed)
Subjective:   Roberto [name] is a 60 y.o. male who presents for Medicare Annual/Subsequent preventive examination.  The patient consented to a virtual visit. Patient consented to have virtual visit and was identified by name and date of birth. Method of visit: Telephone  Encounter participants: Patient: Roberto [name] - located at Home Nurse/Provider: Dorna Bloom - located at [facility] Others (if applicable): NA  Review of Systems: Defer to PCP.  Cardiac Risk Factors include: advanced age (>74mn, >>59women);diabetes mellitus;obesity (BMI >30kg/m2);sedentary lifestyle  Objective:    Vitals: Ht _0  (1.753 m)   Wt (!) 355 lb (161 kg)   BMI 52.42 kg/m   Body mass index is 52.42 kg/m.  Advanced Directives 05/11/2021 02/28/2021 02/16/2021 02/02/2021 12/10/2020 06/30/2020 10/08/2019  Does Patient Have a Medical Advance Directive? _1  No No  Would patient like information on creating a medical advance directive? Yes (MAU/Ambulatory/Procedural Areas - Information given) No - Patient declined No - Patient declined No - Patient declined No - Patient declined No - Patient declined No - Patient declined   Tobacco Social History   Tobacco Use  Smoking Status Never  Smokeless Tobacco Never     Clinical Intake:  Pre-visit preparation completed: Yes  Pain Score: 5   How often do you need to have someone help you when you read instructions, pamphlets, or other written materials from your doctor or pharmacy?: 2 - Rarely What is the last grade level you completed in school?: High School  Interpreter Needed?: No  Past Medical History:  Diagnosis Date   Asthma    Atrial fibrillation (HCC)    CHF (congestive heart failure) (HCC)    COPD (chronic obstructive pulmonary disease) ([facility]    Diabetes mellitus without complication ([place]    Hypertension    Pulmonary hypertension ([facility]    Past Surgical History:  Procedure Laterality Date   RIGHT HEART CATH Right 2022   SHOULDER  SURGERY     Family History  Problem Relation Age of Onset   Diabetes Mother    Cancer Mother    Diabetes Father    High blood pressure Father    Diabetes Brother    Diabetes Brother    Social History   Socioeconomic History   Marital status: Single    Spouse name: Not on file   Number of children: 2   Years of education: 12th grade   Highest education level: High school graduate  Occupational History   Occupation: retired/disabled    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    Patient is divorced with 2 children.   Patient requires 5L of continuous O2.    Social Determinants of Health   Financial Resource Strain: Low Risk    Difficulty of Paying Living Expenses: Not very hard  Food Insecurity: No Food Insecurity   Worried About Running Out of Food in the Last Year: Never true   Ran Out of Food in the Last Year: Never true  Transportation Needs: No  Transportation Needs   Lack of Transportation (Medical): No   Lack of Transportation (Non-Medical): No  Physical Activity: Inactive   Days of Exercise per Week: 0 days   Minutes of Exercise per Session: 0 min  Stress: No Stress Concern Present   Feeling of Stress : Only a little  Social Connections: Moderately Isolated   Frequency of Communication with Friends and Family: More than three times a week   Frequency of Social Gatherings with Friends and Family: More than three times a week   Attends Religious Services: 1 to 4 times per year   Active Member of [employer] or Organizations: No   Attends Archivist Meetings:  Never   Marital Status: Divorced   Outpatient Encounter Medications as of 05/11/2021  Medication Sig   ACCU-CHEK AVIVA PLUS test strip TEST BLOOD SUGAR TWICE DAILY   Accu-Chek Softclix Lancets lancets USE TO TEST TWO TIMES DAILY   acetaminophen (TYLENOL) 650 MG CR tablet Take 1,300 mg by mouth every 8 (eight) hours as needed for pain.   Alcohol Swabs (B-D SINGLE USE SWABS REGULAR) PADS USE TWO TIMES DAILY   Blood Glucose Monitoring Suppl (ACCU-CHEK AVIVA PLUS) w/Device KIT TEST TWO TIMES DAILY   cholecalciferol (VITAMIN D) 1000 units tablet Take 1 tablet (1,000 Units total) by mouth daily.   diclofenac Sodium (VOLTAREN) 1 % GEL Apply 4 g topically 4 (four) times daily as needed (joint pain).   Dulaglutide (TRULICITY) 0.96 GE/3.6OQ SOPN Inject 0.75 mg into the skin once a week.   Elastic Bandages & Supports (WRIST SUPPORT/ELASTIC/FIRM LG) MISC    gabapentin (NEURONTIN) 100 MG capsule TAKE 2 CAPSULES (200MG) TWICE DAILY   glipiZIDE (GLUCOTROL) 5 MG tablet Take by mouth daily before breakfast.   metolazone (ZAROXOLYN) 2.5 MG tablet Take by mouth.   OXYGEN Inhale 5 L into the lungs continuous.   oxymetazoline (AFRIN) 0.05 % nasal spray 1 spray See admin instructions. 2 sprays into one nostril as needed for nasal congestion   Potassium Citrate 99 MG CAPS Take 1 capsule by mouth daily.   PRESCRIPTION MEDICATION Inhale into the lungs as needed (whenever sleeping - naps or at night). CPAP   torsemide (DEMADEX) 20 MG tablet Take 4 tablets (80 mg total) by mouth daily.   warfarin (COUMADIN) 2.5 MG tablet Take 47m on Monday and Thursday. Take 7.527mon the remaining days of the week.   atorvastatin (LIPITOR) 20 MG tablet Take 1 tablet (20 mg total) by mouth daily. (Patient not taking: Reported on 05/11/2021)   Blood Glucose Calibration (TRUE METRIX LEVEL 1) Low SOLN Use as instructed for blood glucose meter calibration (Patient not taking: Reported on 05/11/2021)   ferrous sulfate 325 (65 FE) MG tablet  Take 325 mg by mouth daily. (Patient not taking: No sig reported)   glucose blood (TRUE METRIX BLOOD GLUCOSE TEST) test strip Use as instructed (Patient not taking: Reported on 05/11/2021)   metFORMIN (GLUCOPHAGE) 1000 MG tablet Take 1 tablet (1,000 mg total) by mouth daily. (Patient not taking: Reported on 05/11/2021)   TRUEplus Lancets 33G MISC Use to check blood glucose up to 4 times daily as directed (Patient not taking: Reported on 05/11/2021)   No facility-administered encounter medications on file as of 05/11/2021.   Activities of Daily Living In your present state of health, do you have any difficulty performing the following activities: 05/11/2021 02/03/2021  Hearing? N N  Vision? N N  Difficulty concentrating or making decisions? N N  Walking or climbing stairs?  Y Y  Dressing or bathing? N N  Doing errands, shopping? N N  Preparing Food and eating ? N -  Using the Toilet? N -  In the past six months, have you accidently leaked urine? N -  Do you have problems with loss of bowel control? N -  Managing your Medications? N -  Managing your Finances? N -  Housekeeping or managing your Housekeeping? N -  Some recent data might be hidden   Patient Care Team: Alcus Dad, MD as PCP - General (Family Medicine) Jerline Pain, MD as PCP - Cardiology (Cardiology)   Assessment:   This is a routine wellness examination for [place].  Exercise Activities and Dietary recommendations Current Exercise Habits: The patient does not participate in regular exercise at present, Exercise limited by: respiratory conditions(s)   Goals       Acknowledge receipt of Advanced Directive package     Weight (lb) < 280 lb (127 kg)        Fall Risk Fall Risk  05/11/2021 02/28/2021 02/16/2021 12/10/2020 08/19/2018  Falls in the past year? 1 0 0 0 0  Number falls in past yr: 0 0 0 0 -  Injury with Fall? 0 0 0 0 -  Risk for fall due to : Impaired mobility - - - -  Follow up Falls prevention discussed -  - - -   Is the patient's home free of loose throw rugs in walkways, pet beds, electrical cords, etc?   yes      Grab bars in the bathroom? yes      Handrails on the stairs?   yes      Adequate lighting?   yes  Patient rating of health (0-10): 5   Depression Screen PHQ 2/9 Scores 05/11/2021 04/08/2021 02/28/2021 02/16/2021  PHQ - 2 Score _0 PHQ- 9 Score _1 Cognitive Function 6CIT Screen 05/11/2021  What Year? 0 points  What month? 0 points  What time? 0 points  Count back from 20 0 points  Months in reverse 0 points  Repeat phrase 0 points  Total Score 0   Immunization History  Administered Date(s) Administered   Moderna Sars-Covid-2 Vaccination 01/13/2020, 02/10/2020   PPD Test 05/09/2017   Screening Tests Health Maintenance  Topic Date Due   PNEUMOCOCCAL POLYSACCHARIDE VACCINE AGE 8-64 HIGH RISK  Never done   Pneumococcal Vaccine 50-74 Years old (1 - PCV) Never done   FOOT EXAM  Never done   OPHTHALMOLOGY EXAM  Never done   URINE MICROALBUMIN  Never done   Zoster Vaccines- Shingrix (1 of 2) Never done   COVID-19 Vaccine (3 - Moderna risk series) 03/09/2020   INFLUENZA VACCINE  04/25/2021   HEMOGLOBIN A1C  08/05/2021   COLONOSCOPY (Pts 45-87yr Insurance coverage will need to be confirmed)  09/16/2021   TETANUS/TDAP  04/27/2031   Hepatitis C Screening  Completed   HIV Screening  Completed   HPV VACCINES  Aged Out   Cancer Screenings: Lung: Low Dose CT Chest recommended if Age 61-80years, 30 pack-year currently smoking OR have quit w/in 15years. Patient does not qualify. Colorectal: UTD- due 09/16/2021  Additional Screenings:  Hepatitis C Screening: Completed  HIV Screening: Completed    Plan:  PCP apt declined- [place] [facility] [place] for INR check.  Please call u[place]ith result.  You are due for Covid Booser.  Please  fill out advance directive packet.   I have personally reviewed and noted the following in  the patient's chart:   Medical and social history Use of alcohol, tobacco or illicit drugs  Current medications and supplements Functional ability and status Nutritional status Physical activity Advanced directives List of other physicians Hospitalizations, surgeries, and ER visits in previous 12 months Vitals Screenings to include cognitive, depression, and falls Referrals and appointments  In addition, I have reviewed and discussed with patient certain preventive protocols, quality metrics, and best practice recommendations. A written personalized care plan for preventive services as well as general preventive health recommendations were provided to patient.  This visit was conducted virtually in the setting of the [place] pandemic.    Dorna Bloom, [facility]  05/11/2021

## 2021-05-13 ENCOUNTER — Ambulatory Visit: Payer: Self-pay | Admitting: *Deleted

## 2021-05-13 DIAGNOSIS — I2729 Other secondary pulmonary hypertension: Secondary | ICD-10-CM | POA: Diagnosis not present

## 2021-05-13 DIAGNOSIS — I130 Hypertensive heart and chronic kidney disease with heart failure and stage 1 through stage 4 chronic kidney disease, or unspecified chronic kidney disease: Secondary | ICD-10-CM | POA: Diagnosis not present

## 2021-05-13 DIAGNOSIS — I5043 Acute on chronic combined systolic (congestive) and diastolic (congestive) heart failure: Secondary | ICD-10-CM | POA: Diagnosis not present

## 2021-05-13 DIAGNOSIS — I50813 Acute on chronic right heart failure: Secondary | ICD-10-CM | POA: Diagnosis not present

## 2021-05-13 DIAGNOSIS — N1832 Chronic kidney disease, stage 3b: Secondary | ICD-10-CM | POA: Diagnosis not present

## 2021-05-13 DIAGNOSIS — U071 COVID-19: Secondary | ICD-10-CM | POA: Diagnosis not present

## 2021-05-13 DIAGNOSIS — M170 Bilateral primary osteoarthritis of knee: Secondary | ICD-10-CM | POA: Diagnosis not present

## 2021-05-13 DIAGNOSIS — R5383 Other fatigue: Secondary | ICD-10-CM | POA: Diagnosis not present

## 2021-05-13 DIAGNOSIS — E1122 Type 2 diabetes mellitus with diabetic chronic kidney disease: Secondary | ICD-10-CM | POA: Diagnosis not present

## 2021-05-13 LAB — POCT INR: INR: 4.8 — AB (ref 2.0–3.0)

## 2021-05-13 NOTE — Progress Notes (Signed)
INR checked by home health nurse. Mr Roberto [name] is still supra therapeutic at 4.8. No bruising or bleeding noted. Continue to hold coumadin through the weekend and repeat INR on Monday per pharmacy. [name] Li Bobo

## 2021-05-16 ENCOUNTER — Ambulatory Visit: Payer: Self-pay | Admitting: *Deleted

## 2021-05-16 DIAGNOSIS — J449 Chronic obstructive pulmonary disease, unspecified: Secondary | ICD-10-CM | POA: Diagnosis not present

## 2021-05-16 DIAGNOSIS — I130 Hypertensive heart and chronic kidney disease with heart failure and stage 1 through stage 4 chronic kidney disease, or unspecified chronic kidney disease: Secondary | ICD-10-CM | POA: Diagnosis not present

## 2021-05-16 DIAGNOSIS — I2729 Other secondary pulmonary hypertension: Secondary | ICD-10-CM | POA: Diagnosis not present

## 2021-05-16 DIAGNOSIS — I5043 Acute on chronic combined systolic (congestive) and diastolic (congestive) heart failure: Secondary | ICD-10-CM | POA: Diagnosis not present

## 2021-05-16 DIAGNOSIS — I50813 Acute on chronic right heart failure: Secondary | ICD-10-CM | POA: Diagnosis not present

## 2021-05-16 DIAGNOSIS — I4821 Permanent atrial fibrillation: Secondary | ICD-10-CM | POA: Diagnosis not present

## 2021-05-16 DIAGNOSIS — E1122 Type 2 diabetes mellitus with diabetic chronic kidney disease: Secondary | ICD-10-CM | POA: Diagnosis not present

## 2021-05-16 DIAGNOSIS — M170 Bilateral primary osteoarthritis of knee: Secondary | ICD-10-CM | POA: Diagnosis not present

## 2021-05-16 DIAGNOSIS — N1832 Chronic kidney disease, stage 3b: Secondary | ICD-10-CM | POA: Diagnosis not present

## 2021-05-16 LAB — POCT INR: INR: 2.1 (ref 2.0–3.0)

## 2021-05-16 NOTE — Progress Notes (Signed)
INR checked by home health nurse. Patient is to take 5 mg today per Dr Ardelia Mems, then follow up with coumadin clinic in [facility] tomorrow. They will take over managing his INR. [name] Alvenia Treese

## 2021-05-18 DIAGNOSIS — J449 Chronic obstructive pulmonary disease, unspecified: Secondary | ICD-10-CM | POA: Diagnosis not present

## 2021-05-18 DIAGNOSIS — I50813 Acute on chronic right heart failure: Secondary | ICD-10-CM | POA: Diagnosis not present

## 2021-05-18 DIAGNOSIS — I2729 Other secondary pulmonary hypertension: Secondary | ICD-10-CM | POA: Diagnosis not present

## 2021-05-18 DIAGNOSIS — N1832 Chronic kidney disease, stage 3b: Secondary | ICD-10-CM | POA: Diagnosis not present

## 2021-05-18 DIAGNOSIS — I5043 Acute on chronic combined systolic (congestive) and diastolic (congestive) heart failure: Secondary | ICD-10-CM | POA: Diagnosis not present

## 2021-05-18 DIAGNOSIS — M170 Bilateral primary osteoarthritis of knee: Secondary | ICD-10-CM | POA: Diagnosis not present

## 2021-05-18 DIAGNOSIS — E1122 Type 2 diabetes mellitus with diabetic chronic kidney disease: Secondary | ICD-10-CM | POA: Diagnosis not present

## 2021-05-18 DIAGNOSIS — I4821 Permanent atrial fibrillation: Secondary | ICD-10-CM | POA: Diagnosis not present

## 2021-05-18 DIAGNOSIS — I130 Hypertensive heart and chronic kidney disease with heart failure and stage 1 through stage 4 chronic kidney disease, or unspecified chronic kidney disease: Secondary | ICD-10-CM | POA: Diagnosis not present

## 2021-05-19 ENCOUNTER — Telehealth: Payer: Self-pay | Admitting: *Deleted

## 2021-05-19 NOTE — Telephone Encounter (Signed)
Patient called in, unsure of what his coumadin dose should be. He was instructed to take 5 mg on Monday, then follow up with coumadin clinic the following day. He calls in today saying that his appointment with the coumadin clinic in [place] isn't until 06/01/21. He took 5 mg Monday and Wednesday of this week and 7.5 mg on Tuesday. I instructed the patient to take 5 mg this evening per pharmacy. The home health nurse is coming out tomorrow to check his INR. We will wait for that result for further dosing instructions. [name] Azaliah Carrero

## 2021-05-20 DIAGNOSIS — J449 Chronic obstructive pulmonary disease, unspecified: Secondary | ICD-10-CM | POA: Diagnosis not present

## 2021-05-20 DIAGNOSIS — I4821 Permanent atrial fibrillation: Secondary | ICD-10-CM | POA: Diagnosis not present

## 2021-05-20 DIAGNOSIS — E1122 Type 2 diabetes mellitus with diabetic chronic kidney disease: Secondary | ICD-10-CM | POA: Diagnosis not present

## 2021-05-20 DIAGNOSIS — I5043 Acute on chronic combined systolic (congestive) and diastolic (congestive) heart failure: Secondary | ICD-10-CM | POA: Diagnosis not present

## 2021-05-20 DIAGNOSIS — M170 Bilateral primary osteoarthritis of knee: Secondary | ICD-10-CM | POA: Diagnosis not present

## 2021-05-20 DIAGNOSIS — I2729 Other secondary pulmonary hypertension: Secondary | ICD-10-CM | POA: Diagnosis not present

## 2021-05-20 DIAGNOSIS — I50813 Acute on chronic right heart failure: Secondary | ICD-10-CM | POA: Diagnosis not present

## 2021-05-20 DIAGNOSIS — N1832 Chronic kidney disease, stage 3b: Secondary | ICD-10-CM | POA: Diagnosis not present

## 2021-05-20 DIAGNOSIS — I130 Hypertensive heart and chronic kidney disease with heart failure and stage 1 through stage 4 chronic kidney disease, or unspecified chronic kidney disease: Secondary | ICD-10-CM | POA: Diagnosis not present

## 2021-05-23 ENCOUNTER — Telehealth: Payer: Self-pay | Admitting: *Deleted

## 2021-05-23 NOTE — Telephone Encounter (Signed)
Patient called in to request coumadin dosing and orders for home health INR check. Last INT was one week ago. I spoke with the home health nurse and she will go out tomorrow to check INR. Advised patient to take 5 mg coumadin tonight per pharmacy. We will wait for INR result tomorrow for further dosing instructions. [name] [name]

## 2021-05-24 ENCOUNTER — Ambulatory Visit (INDEPENDENT_AMBULATORY_CARE_PROVIDER_SITE_OTHER): Payer: Medicare HMO | Admitting: *Deleted

## 2021-05-24 DIAGNOSIS — M170 Bilateral primary osteoarthritis of knee: Secondary | ICD-10-CM | POA: Diagnosis not present

## 2021-05-24 DIAGNOSIS — I50813 Acute on chronic right heart failure: Secondary | ICD-10-CM | POA: Diagnosis not present

## 2021-05-24 DIAGNOSIS — N1832 Chronic kidney disease, stage 3b: Secondary | ICD-10-CM | POA: Diagnosis not present

## 2021-05-24 DIAGNOSIS — I4821 Permanent atrial fibrillation: Secondary | ICD-10-CM | POA: Diagnosis not present

## 2021-05-24 DIAGNOSIS — I2729 Other secondary pulmonary hypertension: Secondary | ICD-10-CM | POA: Diagnosis not present

## 2021-05-24 DIAGNOSIS — I5043 Acute on chronic combined systolic (congestive) and diastolic (congestive) heart failure: Secondary | ICD-10-CM | POA: Diagnosis not present

## 2021-05-24 DIAGNOSIS — J449 Chronic obstructive pulmonary disease, unspecified: Secondary | ICD-10-CM | POA: Diagnosis not present

## 2021-05-24 DIAGNOSIS — I130 Hypertensive heart and chronic kidney disease with heart failure and stage 1 through stage 4 chronic kidney disease, or unspecified chronic kidney disease: Secondary | ICD-10-CM | POA: Diagnosis not present

## 2021-05-24 DIAGNOSIS — E1122 Type 2 diabetes mellitus with diabetic chronic kidney disease: Secondary | ICD-10-CM | POA: Diagnosis not present

## 2021-05-24 LAB — POCT INR: INR: 2.5 (ref 2.0–3.0)

## 2021-05-25 ENCOUNTER — Other Ambulatory Visit: Payer: Self-pay | Admitting: Family Medicine

## 2021-05-25 DIAGNOSIS — I4891 Unspecified atrial fibrillation: Secondary | ICD-10-CM

## 2021-05-26 ENCOUNTER — Other Ambulatory Visit: Payer: Self-pay

## 2021-05-26 DIAGNOSIS — N1832 Chronic kidney disease, stage 3b: Secondary | ICD-10-CM | POA: Diagnosis not present

## 2021-05-26 DIAGNOSIS — I50813 Acute on chronic right heart failure: Secondary | ICD-10-CM | POA: Diagnosis not present

## 2021-05-26 DIAGNOSIS — I4821 Permanent atrial fibrillation: Secondary | ICD-10-CM | POA: Diagnosis not present

## 2021-05-26 DIAGNOSIS — M170 Bilateral primary osteoarthritis of knee: Secondary | ICD-10-CM | POA: Diagnosis not present

## 2021-05-26 DIAGNOSIS — E1122 Type 2 diabetes mellitus with diabetic chronic kidney disease: Secondary | ICD-10-CM | POA: Diagnosis not present

## 2021-05-26 DIAGNOSIS — I130 Hypertensive heart and chronic kidney disease with heart failure and stage 1 through stage 4 chronic kidney disease, or unspecified chronic kidney disease: Secondary | ICD-10-CM | POA: Diagnosis not present

## 2021-05-26 DIAGNOSIS — I5043 Acute on chronic combined systolic (congestive) and diastolic (congestive) heart failure: Secondary | ICD-10-CM | POA: Diagnosis not present

## 2021-05-26 DIAGNOSIS — E1142 Type 2 diabetes mellitus with diabetic polyneuropathy: Secondary | ICD-10-CM

## 2021-05-26 DIAGNOSIS — I2729 Other secondary pulmonary hypertension: Secondary | ICD-10-CM | POA: Diagnosis not present

## 2021-05-26 DIAGNOSIS — J449 Chronic obstructive pulmonary disease, unspecified: Secondary | ICD-10-CM | POA: Diagnosis not present

## 2021-05-26 NOTE — Telephone Encounter (Signed)
Received phone call from Aubrey at [facility] regarding gabapentin rx. They are requesting that provider send 2 week supply to local pharmacy until they are able to get shipment out to patient.   Please advise if this can be sent to the [facility] on [address] in [place], [place].   Talbot Grumbling, RN

## 2021-05-27 MED ORDER — GABAPENTIN 100 MG PO CAPS: ORAL_CAPSULE | ORAL | 0 refills | Status: AC

## 2021-06-01 ENCOUNTER — Ambulatory Visit: Payer: Medicare HMO | Admitting: Cardiology

## 2021-06-02 DIAGNOSIS — I2729 Other secondary pulmonary hypertension: Secondary | ICD-10-CM | POA: Diagnosis not present

## 2021-06-02 DIAGNOSIS — I130 Hypertensive heart and chronic kidney disease with heart failure and stage 1 through stage 4 chronic kidney disease, or unspecified chronic kidney disease: Secondary | ICD-10-CM | POA: Diagnosis not present

## 2021-06-02 DIAGNOSIS — I4821 Permanent atrial fibrillation: Secondary | ICD-10-CM | POA: Diagnosis not present

## 2021-06-02 DIAGNOSIS — I5043 Acute on chronic combined systolic (congestive) and diastolic (congestive) heart failure: Secondary | ICD-10-CM | POA: Diagnosis not present

## 2021-06-02 DIAGNOSIS — M170 Bilateral primary osteoarthritis of knee: Secondary | ICD-10-CM | POA: Diagnosis not present

## 2021-06-02 DIAGNOSIS — I50813 Acute on chronic right heart failure: Secondary | ICD-10-CM | POA: Diagnosis not present

## 2021-06-02 DIAGNOSIS — N1832 Chronic kidney disease, stage 3b: Secondary | ICD-10-CM | POA: Diagnosis not present

## 2021-06-02 DIAGNOSIS — J449 Chronic obstructive pulmonary disease, unspecified: Secondary | ICD-10-CM | POA: Diagnosis not present

## 2021-06-02 DIAGNOSIS — E1122 Type 2 diabetes mellitus with diabetic chronic kidney disease: Secondary | ICD-10-CM | POA: Diagnosis not present

## 2021-06-03 ENCOUNTER — Telehealth: Payer: Self-pay | Admitting: *Deleted

## 2021-06-03 ENCOUNTER — Ambulatory Visit (INDEPENDENT_AMBULATORY_CARE_PROVIDER_SITE_OTHER): Payer: Medicare HMO | Admitting: *Deleted

## 2021-06-03 DIAGNOSIS — E1122 Type 2 diabetes mellitus with diabetic chronic kidney disease: Secondary | ICD-10-CM | POA: Diagnosis not present

## 2021-06-03 DIAGNOSIS — N1832 Chronic kidney disease, stage 3b: Secondary | ICD-10-CM | POA: Diagnosis not present

## 2021-06-03 DIAGNOSIS — M170 Bilateral primary osteoarthritis of knee: Secondary | ICD-10-CM | POA: Diagnosis not present

## 2021-06-03 DIAGNOSIS — J449 Chronic obstructive pulmonary disease, unspecified: Secondary | ICD-10-CM | POA: Diagnosis not present

## 2021-06-03 DIAGNOSIS — I50813 Acute on chronic right heart failure: Secondary | ICD-10-CM | POA: Diagnosis not present

## 2021-06-03 DIAGNOSIS — I2729 Other secondary pulmonary hypertension: Secondary | ICD-10-CM | POA: Diagnosis not present

## 2021-06-03 DIAGNOSIS — I5043 Acute on chronic combined systolic (congestive) and diastolic (congestive) heart failure: Secondary | ICD-10-CM | POA: Diagnosis not present

## 2021-06-03 DIAGNOSIS — I130 Hypertensive heart and chronic kidney disease with heart failure and stage 1 through stage 4 chronic kidney disease, or unspecified chronic kidney disease: Secondary | ICD-10-CM | POA: Diagnosis not present

## 2021-06-03 DIAGNOSIS — I4821 Permanent atrial fibrillation: Secondary | ICD-10-CM | POA: Diagnosis not present

## 2021-06-03 LAB — POCT INR: INR: 2.4 (ref 2.0–3.0)

## 2021-06-03 NOTE — Telephone Encounter (Signed)
Agree with below.   Dorris Singh, MD  Family Medicine Teaching Service

## 2021-06-03 NOTE — Telephone Encounter (Signed)
Called and spoke with Mr Adam's concerning his INR checks. Home health nurse will not be going back out after today and his appointment with cardiology is not until mid October. I told him we need to have it checked again before that appointment, preferably in about two weeks. He stated he is going to attempt to get the cardiology office to check it within the next couple of weeks, or possibly at the hospital in [place]. I told him if he is unable to have someone there check it within the next two weeks to please call me back and I would put him on our coumadin clinic. I stressed the importance of him not waiting until mid October to have his INR repeated as he had an INR of 8.0 very recently. [name] Henretta Quist

## 2021-06-03 NOTE — Progress Notes (Signed)
INR checked by home health nurse today. INR within range, continue current dose. Roberto [name] is establishing care with [facility] cardiology to take over management of his coumadin. His appointment with them is not until mid October. Home health services end today. I will contact Roberto Roberto [name] and ask him to come into our clinic in about two weeks to have his INR checked once more before his appointment with cardiology. Plan discussed with Dr Owens Shark. [name] Sharrell Krawiec

## 2021-06-04 DIAGNOSIS — I5043 Acute on chronic combined systolic (congestive) and diastolic (congestive) heart failure: Secondary | ICD-10-CM | POA: Diagnosis not present

## 2021-06-04 DIAGNOSIS — M170 Bilateral primary osteoarthritis of knee: Secondary | ICD-10-CM | POA: Diagnosis not present

## 2021-06-04 DIAGNOSIS — I4821 Permanent atrial fibrillation: Secondary | ICD-10-CM | POA: Diagnosis not present

## 2021-06-04 DIAGNOSIS — I130 Hypertensive heart and chronic kidney disease with heart failure and stage 1 through stage 4 chronic kidney disease, or unspecified chronic kidney disease: Secondary | ICD-10-CM | POA: Diagnosis not present

## 2021-06-04 DIAGNOSIS — E1122 Type 2 diabetes mellitus with diabetic chronic kidney disease: Secondary | ICD-10-CM | POA: Diagnosis not present

## 2021-06-04 DIAGNOSIS — I50813 Acute on chronic right heart failure: Secondary | ICD-10-CM | POA: Diagnosis not present

## 2021-06-04 DIAGNOSIS — J449 Chronic obstructive pulmonary disease, unspecified: Secondary | ICD-10-CM | POA: Diagnosis not present

## 2021-06-04 DIAGNOSIS — N1832 Chronic kidney disease, stage 3b: Secondary | ICD-10-CM | POA: Diagnosis not present

## 2021-06-04 DIAGNOSIS — I2729 Other secondary pulmonary hypertension: Secondary | ICD-10-CM | POA: Diagnosis not present

## 2021-06-07 DIAGNOSIS — I130 Hypertensive heart and chronic kidney disease with heart failure and stage 1 through stage 4 chronic kidney disease, or unspecified chronic kidney disease: Secondary | ICD-10-CM | POA: Diagnosis not present

## 2021-06-07 DIAGNOSIS — I4821 Permanent atrial fibrillation: Secondary | ICD-10-CM | POA: Diagnosis not present

## 2021-06-07 DIAGNOSIS — E1122 Type 2 diabetes mellitus with diabetic chronic kidney disease: Secondary | ICD-10-CM | POA: Diagnosis not present

## 2021-06-07 DIAGNOSIS — J449 Chronic obstructive pulmonary disease, unspecified: Secondary | ICD-10-CM | POA: Diagnosis not present

## 2021-06-07 DIAGNOSIS — I50813 Acute on chronic right heart failure: Secondary | ICD-10-CM | POA: Diagnosis not present

## 2021-06-07 DIAGNOSIS — I2729 Other secondary pulmonary hypertension: Secondary | ICD-10-CM | POA: Diagnosis not present

## 2021-06-07 DIAGNOSIS — N1832 Chronic kidney disease, stage 3b: Secondary | ICD-10-CM | POA: Diagnosis not present

## 2021-06-07 DIAGNOSIS — I5043 Acute on chronic combined systolic (congestive) and diastolic (congestive) heart failure: Secondary | ICD-10-CM | POA: Diagnosis not present

## 2021-06-07 DIAGNOSIS — M170 Bilateral primary osteoarthritis of knee: Secondary | ICD-10-CM | POA: Diagnosis not present

## 2021-06-09 DIAGNOSIS — I50813 Acute on chronic right heart failure: Secondary | ICD-10-CM | POA: Diagnosis not present

## 2021-06-09 DIAGNOSIS — E1122 Type 2 diabetes mellitus with diabetic chronic kidney disease: Secondary | ICD-10-CM | POA: Diagnosis not present

## 2021-06-09 DIAGNOSIS — I4821 Permanent atrial fibrillation: Secondary | ICD-10-CM | POA: Diagnosis not present

## 2021-06-09 DIAGNOSIS — N1832 Chronic kidney disease, stage 3b: Secondary | ICD-10-CM | POA: Diagnosis not present

## 2021-06-09 DIAGNOSIS — I5043 Acute on chronic combined systolic (congestive) and diastolic (congestive) heart failure: Secondary | ICD-10-CM | POA: Diagnosis not present

## 2021-06-09 DIAGNOSIS — I2729 Other secondary pulmonary hypertension: Secondary | ICD-10-CM | POA: Diagnosis not present

## 2021-06-09 DIAGNOSIS — I130 Hypertensive heart and chronic kidney disease with heart failure and stage 1 through stage 4 chronic kidney disease, or unspecified chronic kidney disease: Secondary | ICD-10-CM | POA: Diagnosis not present

## 2021-06-09 DIAGNOSIS — M170 Bilateral primary osteoarthritis of knee: Secondary | ICD-10-CM | POA: Diagnosis not present

## 2021-06-09 DIAGNOSIS — J449 Chronic obstructive pulmonary disease, unspecified: Secondary | ICD-10-CM | POA: Diagnosis not present

## 2021-06-15 ENCOUNTER — Other Ambulatory Visit: Payer: Self-pay | Admitting: Family Medicine

## 2021-06-24 ENCOUNTER — Other Ambulatory Visit: Payer: Self-pay | Admitting: Nephrology

## 2021-06-24 ENCOUNTER — Other Ambulatory Visit (HOSPITAL_COMMUNITY): Payer: Self-pay | Admitting: Nephrology

## 2021-06-24 DIAGNOSIS — D638 Anemia in other chronic diseases classified elsewhere: Secondary | ICD-10-CM

## 2021-06-24 DIAGNOSIS — E1122 Type 2 diabetes mellitus with diabetic chronic kidney disease: Secondary | ICD-10-CM

## 2021-06-24 DIAGNOSIS — I129 Hypertensive chronic kidney disease with stage 1 through stage 4 chronic kidney disease, or unspecified chronic kidney disease: Secondary | ICD-10-CM

## 2021-06-27 ENCOUNTER — Telehealth: Payer: Self-pay | Admitting: Radiology

## 2021-07-06 ENCOUNTER — Encounter: Payer: Self-pay | Admitting: Cardiology

## 2021-07-06 ENCOUNTER — Telehealth (HOSPITAL_COMMUNITY): Payer: Self-pay | Admitting: Cardiology

## 2021-07-06 ENCOUNTER — Ambulatory Visit: Payer: Medicare HMO | Admitting: Cardiology

## 2021-07-06 ENCOUNTER — Ambulatory Visit (INDEPENDENT_AMBULATORY_CARE_PROVIDER_SITE_OTHER): Payer: Medicare HMO | Admitting: *Deleted

## 2021-07-06 VITALS — BP 104/68 | HR 67 | Ht 69.0 in | Wt 321.4 lb

## 2021-07-06 DIAGNOSIS — Z5181 Encounter for therapeutic drug level monitoring: Secondary | ICD-10-CM | POA: Diagnosis not present

## 2021-07-06 DIAGNOSIS — I272 Pulmonary hypertension, unspecified: Secondary | ICD-10-CM | POA: Diagnosis not present

## 2021-07-06 DIAGNOSIS — I824Y2 Acute embolism and thrombosis of unspecified deep veins of left proximal lower extremity: Secondary | ICD-10-CM

## 2021-07-06 DIAGNOSIS — I4891 Unspecified atrial fibrillation: Secondary | ICD-10-CM

## 2021-07-06 DIAGNOSIS — I82412 Acute embolism and thrombosis of left femoral vein: Secondary | ICD-10-CM

## 2021-07-06 LAB — POCT INR: INR: 2.1 (ref 2.0–3.0)

## 2021-07-06 NOTE — Progress Notes (Signed)
Cardiology Office Note  Date: 07/06/2021   ID: Roberto [name], DOB July 27, 1961, MRN [id number]  PCP:  Wilburt Finlay, MD  Cardiologist:  Rozann Lesches, MD Electrophysiologist:  None   Chief Complaint  Patient presents with   Establish Coumadin follow-up     History of Present Illness: Roberto [name] is a medically complex 60 y.o. male referred for cardiology consultation by Dr. Ileana Roup to establish cardiology follow-up and management in the anticoagulation clinic.  I reviewed extensive records and updated the chart.  Most recently he has been followed through the [facility] cardiology system including testing at [facility].  Records indicate hospitalization in August of this year with anasarca in the setting of known severe pulmonary hypertension and cor pulmonale.  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  He was started on Adempas, but ultimately stopped the medication related to substantial weight gain despite continued use of torsemide.  He required Lasix drip for diuresis and was ultimately stabilized.  Also diagnosed with COVID-19 during that hospital stay and at baseline has rate controlled atrial fibrillation for which he has been taking Coumadin given previous intolerances to Eliquis and Xarelto secondary to recurrent nosebleeds.  Actually, the main reason that he was referred to [place] is because his primary cardiology service does not have an anticoagulation clinic locally and he can apparently not get it checked by his PCP either.  He has decided to switch to our practice going forward to try and consolidate his care.  He states that his last INR was in late September at 2.1.  I reviewed his medications which are noted below.  He does not report any spontaneous bleeding problems on Coumadin.  On the current  diuretic regimen including Demadex and metolazone, he has continued to diurese and states that he feels better.  Legs much less swollen.  He does have chronic hypoxic respiratory failure and is typically on supplemental oxygen via nasal cannula between 3 and 5 L at rest.  His most recent lab work in September showed a BUN of 83, potassium 3.4 and creatinine of 1.99. He is following with Dr. Theador Hawthorne with CKD stage IIIb in the setting of type 2 diabetes mellitus.  I personally reviewed his ECG today which shows rate controlled atrial fibrillation in the 60s with right bundle branch block and diffuse repolarization abnormalities.  He has not had a repeat echocardiogram recently, last study being in June 2021 as noted below.  Past Medical History:  Diagnosis Date   Asthma    Atrial fibrillation (HCC)    CKD (chronic kidney disease) stage 3, GFR 30-59 ml/min (HCC)    COPD (chronic obstructive pulmonary disease) ([place])    COVID-19    Essential hypertension    History of DVT (deep vein thrombosis)    Lung nodule    Nosebleed    Reportedly recurrent on Eliquis and Xarelto   OSA on CPAP    Pulmonary hypertension (HCC)    Respiratory failure with hypoxia (HCC)    Type 2 diabetes mellitus ([place])     Past Surgical History:  Procedure Laterality Date   RIGHT HEART CATH Right 2022   SHOULDER SURGERY      Current Outpatient Medications  Medication Sig Dispense Refill   ACCU-CHEK AVIVA PLUS test strip TEST BLOOD SUGAR TWICE DAILY 200  strip 1   Accu-Chek Softclix Lancets lancets USE TO TEST TWO TIMES DAILY 200 each 3   acetaminophen (TYLENOL) 650 MG CR tablet Take 1,300 mg by mouth every 8 (eight) hours as needed for pain.     Alcohol Swabs (B-D SINGLE USE SWABS REGULAR) PADS USE TWO TIMES DAILY 200 each 3   Blood Glucose Monitoring Suppl (ACCU-CHEK AVIVA PLUS) w/Device KIT TEST TWO TIMES DAILY 1 kit 0   cholecalciferol (VITAMIN D) 1000 units tablet Take 1 tablet (1,000 Units total) by mouth daily.  90 tablet 2   Colchicine 0.6 MG CAPS TAKE 2 CAPSULES BY MOUTH AT FIRST SIGN OF FLARE FOLLOWED BY 1 CAPSULE IN 1-HOUR, THEN 1 CAPSULE BY MOUTH DAILY THEREAFTER     diclofenac Sodium (VOLTAREN) 1 % GEL Apply 4 g topically 4 (four) times daily as needed (joint pain). 100 g 1   Elastic Bandages & Supports (WRIST SUPPORT/ELASTIC/FIRM LG) MISC      ferrous sulfate 325 (65 FE) MG tablet Take 325 mg by mouth daily.     gabapentin (NEURONTIN) 100 MG capsule TAKE 2 CAPSULES (200MG) TWICE DAILY 60 capsule 0   glipiZIDE (GLUCOTROL) 5 MG tablet Take by mouth daily before breakfast.     metolazone (ZAROXOLYN) 2.5 MG tablet Take 2.5 mg by mouth daily.     OXYGEN Inhale 5 L into the lungs continuous.     oxymetazoline (AFRIN) 0.05 % nasal spray 1 spray See admin instructions. 2 sprays into one nostril as needed for nasal congestion     potassium chloride SA (KLOR-CON) 20 MEQ tablet Take 1 tablet by mouth daily.     PRESCRIPTION MEDICATION Inhale into the lungs as needed (whenever sleeping - naps or at night). CPAP     torsemide (DEMADEX) 20 MG tablet Take 60 mg by mouth daily.     warfarin (COUMADIN) 2.5 MG tablet TAKE 2 TABLETS (5MG) ON MONDAY AND THURSDAY. TAKE 3 TABLETS (7.5MG) ON THE REMAINING DAYS OF THE WEEK. 80 tablet 0   atorvastatin (LIPITOR) 20 MG tablet Take 1 tablet (20 mg total) by mouth daily. (Patient not taking: No sig reported) 30 tablet 2   Dulaglutide (TRULICITY) 8.56 DJ/4.9FW SOPN Inject 0.75 mg into the skin once a week. (Patient not taking: Reported on 07/06/2021) 2 mL 1   No current facility-administered medications for this visit.   Allergies:  Bacitracin-neomycin-polymyxin   Social History: The patient  reports that he has never smoked. He has never used smokeless tobacco. He reports that he does not drink alcohol and does not use drugs.   Family History: The patient's family history includes Cancer in his mother; Diabetes in his brother, brother, father, and mother; High blood  pressure in his father.   ROS: Recurrent gout.  No syncope.  Physical Exam: VS:  BP 104/68   Pulse 67   Ht _0  (1.753 m)   Wt (!) 321 lb 6.4 oz (145.8 kg)   SpO2 97%   BMI 47.46 kg/m , BMI Body mass index is 47.46 kg/m.  Wt Readings from Last 3 Encounters:  07/06/21 (!) 321 lb 6.4 oz (145.8 kg)  05/11/21 (!) 355 lb (161 kg)  04/08/21 (!) 357 lb (161.9 kg)    General: Chronically ill-appearing male in motorized scooter wearing oxygen via nasal cannula. HEENT: Conjunctiva and lids normal, wearing a mask. Neck: Supple, difficult to assess JVP. Lungs: Decreased breath sounds without wheezing, nonlabored breathing at rest. Cardiac: Irregularly irregular, no S3, 2/6 apical systolic murmur, no  pericardial rub. Abdomen: Protuberant with pannus. Extremities: Chronic appearing edema with significant stasis changes. Skin: Warm and dry.  Hyperpigmented. Musculoskeletal: No kyphosis. Neuropsychiatric: Alert and oriented x3, affect grossly appropriate.  ECG:  An ECG dated 04/26/2021 was personally reviewed today and demonstrated:  Atrial fibrillation with right bundle branch block.  Recent Labwork: 02/02/2021: B Natriuretic Peptide 458.0 02/03/2021: Hemoglobin 11.3; Magnesium 1.8; Platelets 294 02/28/2021: BUN 73; Creatinine, Ser 1.97; Potassium 3.5; Sodium 138     Component Value Date/Time   CHOL 137 12/10/2020 1618   TRIG 77 12/10/2020 1618   HDL 27 (L) 12/10/2020 1618   CHOLHDL 5.1 (H) 12/10/2020 1618   LDLCALC 95 12/10/2020 1618  September 2022: INR 2.1, potassium 3.4, BUN 83, creatinine 1.99, AST 24, ALT 03 May 2021: Hemoglobin A1c 8%, high-sensitivity troponin I 30s to 50s, hemoglobin 10.5, platelets 363, SARS coronavirus 2 test positive  Other Studies Reviewed Today:  Echocardiogram 03/15/2020: Summary    1. The left ventricle is normal in size with normal wall thickness.    2. The left ventricular systolic function is normal, LVEF is visually  estimated at 65-70%.    3.  The left atrium is mildly to moderately dilated in size.    4. The right ventricle is moderately to severely dilated in size, with  moderately reduced systolic function.    5. There is severe pulmonary hypertension, estimated pulmonary artery  systolic pressure is 98 mmHg.    6. The right atrium is mildly to moderately dilated  in size.   Chest CT 06/14/2020: IMPRESSION:  Stable right lower lobe pulmonary nodule when compared with the  prior exam. Given its long-term stability from 2018 it is felt to be  benign in etiology. No further follow-up is recommended.   Stable mediastinal lymph nodes consistent with a reactive nature.   Stable right adrenal lesion likely representing an adenoma.   No other focal abnormality is noted.   Aortic Atherosclerosis (ICD10-I70.0).   Right heart catheterization 03/04/2021 [facility]): Findings:   Severe pulmonary hypertension  (PA Systolic 95 mm Hg, Diastolic 45 mm  Hg, Mean 60 mm Hg); consistent with mixed pre and post capillary pulmonary  hypertension   Elevated right sided and left sided filling pressures (RA mean 52mHg,  PCWP mean 231mg)   Reduced cardiac output (Fick CO 5.07 L/min, CI 1.93)   Pulmonary angiography technically limited by significant dilated  pulmonary artery, low cardiac output, body habitus, patient motion, and  attempts at minimizing contrast usage due to CKD. However, despite these  limitations, angiogram demonstrates overall normal filling throughout the  pulmonary arteries without evidence of chronic thromboembolic disease.   Pressures  Right atrium Mean 22 mm Hg  Right ventricle Systolic 91 mm Hg, End-diastolic 22 mm Hg  Pulmonary artery  Systolic 95 mm Hg, Diastolic 45 mm Hg, Mean 60 mm Hg  Pulmonary capillary wedge Mean 22 mm Hg   Arterial saturation: 100% (on 5L nasal cannula)  Mixed venous saturation: 54%   Fick cardiac output: 5.07 L/min  Fick cardiac index: 1.93 L/min/m^2   Thermal cardiac  output: 4.68 L/min  Thermal cardiac index: 1.78 L/min/m^2   Systemic vascular resistance (SVR): 1278 dynes*s/cm^5 (Fick); 1385  dynes*s/cm^5 (Thermodilution)  Pulmonary vascular resistance (PVR): 16 Wood units (Fick); 17 Wood units  (Thermodilution)   Pulmonary Angiography   Notably, the angiography was technically limited by significant dilated  pulmonary artery, low cardiac output, body habitus, patient motion, and  attempts at minimizing contrast usage due to  CKD. However, despite these  limitations, angiogram demonstrates overall normal filling throughout the  pulmonary arteries without evidence of chronic thromboembolic disease.   Right Pulmonary Artery  Main Right Pulmonary artery: Normal  Truncus anterior artery: Normal  Interlobar pulmonary artery: Normal  Right Upper Lobe  Anterior segmental artery: Normal  Apical segmental artery: Normal  Posterior segmental artery: Normal  Right Middle Lobe  Medial segmental artery: Normal  Lateral segmental artery: Normal  Right Lower Lobe  Superior segmental artery: Normal  Medial basal segmental artery: Normal  Posterior basal segmental artery: Normal  Lateral basal segmental artery:  Normal  Anterior basal segmental artery:  Normal     Left Pulmonary Artery  Main pulmonary artery: Normal  Left Upper Lobe  Apicoposterior segmental artery: Normal  Anterior segmental artery: Normal  Left Lingula  Superior segmental artery: Normal  Inferior segmental artery: Normal  Left Lower Lobe  Superior segmental artery: Normal  Anteromedial basal segmental artery: Normal  Posterior basal segmental artery: Normal  Lateral basal sub-segmental artery: Normal    Assessment and Plan:  1.  Persistent/permanent atrial fibrillation with CHA2DS2-VASc score of at least 2.  Records indicate prior intolerance of both Eliquis and Xarelto related to recurrent nosebleeds, but apparently tolerating Coumadin.  He is referred to establish in our  anticoagulation clinic.  Check PT/INR with follow-up plan from there.  He is in rate controlled atrial fibrillation by ECG at the present time not on any AV nodal blockers.  2.  Severe pulmonary hypertension, apparently mixed picture (WHO group 1, 2, 3 possibly).  Right heart catheterization and pulmonary artery angiogram results are noted above.  He was treated with Adempas by specialist through the [facility] system, developed substantial fluid overload and had to stop the medication.  He is apparently pending consultation in the pulmonary hypertension clinic at [facility] to discuss nonmedication treatment strategies.  He would like to simplify his cardiology care, we will get him set scheduled in our pulmonary hypertension clinic as well locally who can work in concert with [place] if necessary.  Echocardiogram will be updated.  He has cor pulmonale, reports diuresis and good control of fluid status on present diuretic regimen which includes Demadex and Zaroxolyn with potassium supplement.  3.  OSA on CPAP.  4.  COPD.  5.  Chronic hypoxic respiratory failure, on supplemental oxygen between 3 and 5 L nasal cannula.  6.  Type 2 diabetes mellitus, currently on Glucotrol and Trulicity, taken off metformin due to renal insufficiency.  7.  CKD stage IIIb, now following with Dr. Theador Hawthorne.  Recent creatinine 1.99.  8.  Previous history of DVT.  Recent pulmonary artery angiogram did not show clear evidence of chronic thromboembolic disease as cause of his pulmonary hypertension and cor pulmonale.  Medication Adjustments/Labs and Tests Ordered: Current medicines are reviewed at length with the patient today.  Concerns regarding medicines are outlined above.   Tests Ordered: Orders Placed This Encounter  Procedures   AMB referral to CHF clinic   EKG 12-Lead   ECHOCARDIOGRAM COMPLETE    Medication Changes: No orders of the defined types were placed in this encounter.   Disposition:  Follow up  3  months.  Signed, Satira Sark, MD, [facility] 07/06/2021 3:44 PM    [facility] [place], [place], [place] [address] Phone: [phone]; Fax: [phone]

## 2021-07-06 NOTE — Telephone Encounter (Signed)
[place] left vm to schedule pt, referral sent 07/06/21, please advise

## 2021-07-06 NOTE — Patient Instructions (Signed)
Continue warfarin 2 tablets (86m) daily except 3 tablets (7.544m on Tuesdays and Saturdays Recheck INR in 3 weeks

## 2021-07-06 NOTE — Patient Instructions (Addendum)
Medication Instructions:  Your physician recommends that you continue on your current medications as directed. Please refer to the Current Medication list given to you today.  Labwork: Need INR today  Testing/Procedures: Your physician has requested that you have an echocardiogram. Echocardiography is a painless test that uses sound waves to create images of your heart. It provides your doctor with information about the size and shape of your heart and how well your heart's chambers and valves are working. This procedure takes approximately one hour. There are no restrictions for this procedure.  Follow-Up: Your physician recommends that you schedule a follow-up appointment in: 3 months  Any Other Special Instructions Will Be Listed Below (If Applicable). You have been referred to Anticoagulation Clinic You have been referred to Heart Failure Clinic   If you need a refill on your cardiac medications before your next appointment, please call your pharmacy.

## 2021-07-07 ENCOUNTER — Other Ambulatory Visit: Payer: Self-pay | Admitting: Family Medicine

## 2021-07-07 DIAGNOSIS — E1142 Type 2 diabetes mellitus with diabetic polyneuropathy: Secondary | ICD-10-CM

## 2021-07-08 ENCOUNTER — Other Ambulatory Visit: Payer: Self-pay | Admitting: Family Medicine

## 2021-07-08 DIAGNOSIS — I4891 Unspecified atrial fibrillation: Secondary | ICD-10-CM

## 2021-07-12 NOTE — Telephone Encounter (Signed)
Sent to [place] to schedule

## 2021-07-13 ENCOUNTER — Ambulatory Visit (HOSPITAL_COMMUNITY): Admission: RE | Admit: 2021-07-13 | Payer: Medicare HMO | Source: Ambulatory Visit

## 2021-07-13 NOTE — Telephone Encounter (Signed)
Declined refill request for Coumadin. This is now being managed by his cardiologist in [place].

## 2021-07-20 ENCOUNTER — Ambulatory Visit (HOSPITAL_COMMUNITY)
Admission: RE | Admit: 2021-07-20 | Discharge: 2021-07-20 | Disposition: A | Discharge status: Home / Self Care | Payer: Medicare HMO | Source: Ambulatory Visit | Attending: Nephrology | Admitting: Nephrology

## 2021-07-20 ENCOUNTER — Other Ambulatory Visit: Payer: Self-pay

## 2021-07-20 DIAGNOSIS — I129 Hypertensive chronic kidney disease with stage 1 through stage 4 chronic kidney disease, or unspecified chronic kidney disease: Secondary | ICD-10-CM | POA: Diagnosis present

## 2021-07-20 DIAGNOSIS — E1122 Type 2 diabetes mellitus with diabetic chronic kidney disease: Secondary | ICD-10-CM | POA: Insufficient documentation

## 2021-07-20 DIAGNOSIS — D638 Anemia in other chronic diseases classified elsewhere: Secondary | ICD-10-CM | POA: Diagnosis present

## 2021-07-28 ENCOUNTER — Ambulatory Visit (INDEPENDENT_AMBULATORY_CARE_PROVIDER_SITE_OTHER): Payer: Medicare HMO | Admitting: *Deleted

## 2021-07-28 DIAGNOSIS — I4891 Unspecified atrial fibrillation: Secondary | ICD-10-CM | POA: Diagnosis not present

## 2021-07-28 DIAGNOSIS — I824Y2 Acute embolism and thrombosis of unspecified deep veins of left proximal lower extremity: Secondary | ICD-10-CM

## 2021-07-28 DIAGNOSIS — Z5181 Encounter for therapeutic drug level monitoring: Secondary | ICD-10-CM | POA: Diagnosis not present

## 2021-07-28 LAB — POCT INR: INR: 1.6 — AB (ref 2.0–3.0)

## 2021-07-28 NOTE — Patient Instructions (Signed)
Take warfarin 3 tablets tonight and tomorrow night then increase dose to 2 tablets (45m) daily except 3 tablets (7.541m on Tuesdays, Thursdays and Saturdays Recheck INR in 2 weeks

## 2021-08-03 ENCOUNTER — Other Ambulatory Visit: Payer: Self-pay | Admitting: Family Medicine

## 2021-08-11 ENCOUNTER — Ambulatory Visit (INDEPENDENT_AMBULATORY_CARE_PROVIDER_SITE_OTHER): Payer: Medicare HMO | Admitting: *Deleted

## 2021-08-11 DIAGNOSIS — Z5181 Encounter for therapeutic drug level monitoring: Secondary | ICD-10-CM | POA: Diagnosis not present

## 2021-08-11 DIAGNOSIS — I824Y2 Acute embolism and thrombosis of unspecified deep veins of left proximal lower extremity: Secondary | ICD-10-CM | POA: Diagnosis not present

## 2021-08-11 DIAGNOSIS — I4891 Unspecified atrial fibrillation: Secondary | ICD-10-CM

## 2021-08-11 LAB — POCT INR: INR: 2 (ref 2.0–3.0)

## 2021-08-11 MED ORDER — WARFARIN SODIUM 2.5 MG PO TABS: ORAL_TABLET | ORAL | 3 refills | Status: DC

## 2021-08-11 NOTE — Patient Instructions (Signed)
Continue warfarin 2 tablets (38m) daily except 3 tablets (7.54m on Tuesdays, Thursdays and Saturdays Recheck INR in 3 weeks

## 2021-08-16 ENCOUNTER — Ambulatory Visit (INDEPENDENT_AMBULATORY_CARE_PROVIDER_SITE_OTHER): Payer: Medicare HMO

## 2021-08-16 ENCOUNTER — Telehealth: Payer: Self-pay | Admitting: Cardiology

## 2021-08-16 ENCOUNTER — Encounter: Payer: Self-pay | Admitting: *Deleted

## 2021-08-16 DIAGNOSIS — I272 Pulmonary hypertension, unspecified: Secondary | ICD-10-CM | POA: Diagnosis not present

## 2021-08-16 DIAGNOSIS — Z23 Encounter for immunization: Secondary | ICD-10-CM | POA: Diagnosis not present

## 2021-08-16 LAB — ECHOCARDIOGRAM COMPLETE
Area-P 1/2: 2.05 cm2
S' Lateral: 3.8 cm
Single Plane A4C EF: 66.8 %

## 2021-08-16 MED ORDER — TORSEMIDE 20 MG PO TABS: 60.0000 mg | ORAL_TABLET | Freq: Every day | ORAL | 2 refills | Status: AC

## 2021-08-16 NOTE — Telephone Encounter (Signed)
*  STAT* If patient is at the pharmacy, call can be transferred to refill team.   1. Which medications need to be refilled? (please list name of each medication and dose if known)   torsemide (DEMADEX) 20 MG tablet [[id number]]  Would like to get it written for 4x a day instead of 3  metolazone (ZAROXOLYN) 2.5 MG tablet [[id number]]   2. Which pharmacy/location (including street and city if local pharmacy) is medication to be sent to? [facility]   3. Do they need a 30 day or 90 day supply?  90 day

## 2021-08-17 ENCOUNTER — Telehealth: Payer: Self-pay | Admitting: *Deleted

## 2021-08-17 ENCOUNTER — Telehealth: Payer: Self-pay | Admitting: Family Medicine

## 2021-08-17 NOTE — Telephone Encounter (Signed)
Depends on [employer] for transportation and wanted to cancel HF Clinic appointment [facility] transportation service information given to patient and enrollment form completed and emailed Patient aware to call [phone] to set up transportation

## 2021-08-17 NOTE — Telephone Encounter (Signed)
   Roberto [name] DOB: [birth year] MRN: [id number]   RIDER WAIVER AND RELEASE OF LIABILITY  For purposes of improving physical access to our facilities, Roberto [name] is pleased to partner with third parties to provide Roberto [name] or other authorized individuals the option of convenient, on-demand ground transportation services (the [place]") through use of the technology service that enables users to request on-demand ground transportation from independent third-party providers.  By opting to use and accept these Roberto [name], I, the undersigned, hereby agree on behalf of myself, and on behalf of any minor child using the Roberto [name] for whom I am the parent or legal guardian, as follows:  Roberto [name] provided to me are provided by independent third-party transportation providers who are not Roberto [name] or employees and who are unaffiliated with Roberto [name]. Roberto [name] is neither a transportation carrier nor a common or public carrier. Roberto [name] has no control over the quality or safety of the transportation that occurs as a result of the Roberto [name]. [facility] cannot guarantee that any third-party transportation provider will complete any arranged transportation service. [facility] makes no representation, warranty, or guarantee regarding the reliability, timeliness, quality, safety, suitability, or availability of any of the Transport Services or that they will be error free. I fully understand that traveling by vehicle involves risks and dangers of serious bodily injury, including permanent disability, paralysis, and death. I agree, on behalf of myself and on behalf of any minor child using the Transport Services for whom I am the parent or legal guardian, that the entire risk arising out of my use of the Roberto [name] remains solely with me, to the maximum extent permitted under applicable law. The Roberto [name] are provided "as  is" and "as available." Roberto [name] disclaims all representations and warranties, express, implied or statutory, not expressly set out in these terms, including the implied warranties of merchantability and fitness for a particular purpose. I hereby waive and release Roberto [name], its agents, employees, officers, directors, representatives, insurers, attorneys, assigns, successors, subsidiaries, and affiliates from any and all past, present, or future claims, demands, liabilities, actions, causes of action, or suits of any kind directly or indirectly arising from acceptance and use of the Roberto [name]. I further waive and release Roberto [name] and its affiliates from all present and future liability and responsibility for any injury or death to persons or damages to property caused by or related to the use of the Roberto [name]. I have read this Waiver and Release of Liability, and I understand the terms used in it and their legal significance. This Waiver is freely and voluntarily given with the understanding that my right (as well as the right of any minor child for whom I am the parent or legal guardian using the Roberto [name]) to legal recourse against Roberto [name] in connection with the Roberto [name] is knowingly surrendered in return for use of these services.   I attest that I read the consent document to Consepcion [name], gave Roberto [name] the opportunity to ask questions and answered the questions asked (if any). I affirm that Consepcion [name] then provided consent for he's participation in this program.     Roberto [name]

## 2021-08-17 NOTE — Telephone Encounter (Signed)
-----  Message from Satira Sark, MD sent at 08/16/2021  3:59 PM EST ----- Results reviewed.  Patient with known severe pulmonary hypertension, recent office visit to establish in anticoagulation clinic, and already referred to the advanced heart failure clinic for further management.  Continue with current medications for now.

## 2021-08-17 NOTE — Telephone Encounter (Signed)
Patient informed. Copy sent to PCP

## 2021-08-29 ENCOUNTER — Ambulatory Visit (HOSPITAL_COMMUNITY)
Admission: RE | Admit: 2021-08-29 | Discharge: 2021-08-29 | Disposition: A | Discharge status: Home / Self Care | Payer: Medicare HMO | Source: Ambulatory Visit | Attending: Internal Medicine | Admitting: Internal Medicine

## 2021-08-29 ENCOUNTER — Other Ambulatory Visit: Payer: Self-pay

## 2021-08-29 ENCOUNTER — Encounter (HOSPITAL_COMMUNITY): Payer: Self-pay | Admitting: Internal Medicine

## 2021-08-29 ENCOUNTER — Other Ambulatory Visit (HOSPITAL_COMMUNITY): Payer: Self-pay

## 2021-08-29 VITALS — BP 102/60 | HR 61 | Wt 310.2 lb

## 2021-08-29 DIAGNOSIS — Z6841 Body Mass Index (BMI) 40.0 and over, adult: Secondary | ICD-10-CM | POA: Insufficient documentation

## 2021-08-29 DIAGNOSIS — I071 Rheumatic tricuspid insufficiency: Secondary | ICD-10-CM | POA: Insufficient documentation

## 2021-08-29 DIAGNOSIS — R600 Localized edema: Secondary | ICD-10-CM | POA: Insufficient documentation

## 2021-08-29 DIAGNOSIS — I129 Hypertensive chronic kidney disease with stage 1 through stage 4 chronic kidney disease, or unspecified chronic kidney disease: Secondary | ICD-10-CM | POA: Diagnosis not present

## 2021-08-29 DIAGNOSIS — E1122 Type 2 diabetes mellitus with diabetic chronic kidney disease: Secondary | ICD-10-CM | POA: Insufficient documentation

## 2021-08-29 DIAGNOSIS — J449 Chronic obstructive pulmonary disease, unspecified: Secondary | ICD-10-CM | POA: Insufficient documentation

## 2021-08-29 DIAGNOSIS — Z86718 Personal history of other venous thrombosis and embolism: Secondary | ICD-10-CM | POA: Insufficient documentation

## 2021-08-29 DIAGNOSIS — I482 Chronic atrial fibrillation, unspecified: Secondary | ICD-10-CM

## 2021-08-29 DIAGNOSIS — E662 Morbid (severe) obesity with alveolar hypoventilation: Secondary | ICD-10-CM | POA: Diagnosis not present

## 2021-08-29 DIAGNOSIS — I2781 Cor pulmonale (chronic): Secondary | ICD-10-CM | POA: Diagnosis not present

## 2021-08-29 DIAGNOSIS — I272 Pulmonary hypertension, unspecified: Secondary | ICD-10-CM | POA: Diagnosis present

## 2021-08-29 DIAGNOSIS — N1832 Chronic kidney disease, stage 3b: Secondary | ICD-10-CM | POA: Diagnosis not present

## 2021-08-29 DIAGNOSIS — I5043 Acute on chronic combined systolic (congestive) and diastolic (congestive) heart failure: Secondary | ICD-10-CM

## 2021-08-29 DIAGNOSIS — Z9981 Dependence on supplemental oxygen: Secondary | ICD-10-CM | POA: Diagnosis not present

## 2021-08-29 DIAGNOSIS — J9611 Chronic respiratory failure with hypoxia: Secondary | ICD-10-CM | POA: Diagnosis not present

## 2021-08-29 DIAGNOSIS — Z7901 Long term (current) use of anticoagulants: Secondary | ICD-10-CM | POA: Insufficient documentation

## 2021-08-29 LAB — CBC
HCT: 42.8 % (ref 39.0–52.0)
Hemoglobin: 13.5 g/dL (ref 13.0–17.0)
MCH: 28.7 pg (ref 26.0–34.0)
MCHC: 31.5 g/dL (ref 30.0–36.0)
MCV: 90.9 fL (ref 80.0–100.0)
Platelets: 230 10*3/uL (ref 150–400)
RBC: 4.71 MIL/uL (ref 4.22–5.81)
RDW: 16.8 % — ABNORMAL HIGH (ref 11.5–15.5)
WBC: 4.6 10*3/uL (ref 4.0–10.5)
nRBC: 0 % (ref 0.0–0.2)

## 2021-08-29 LAB — COMPREHENSIVE METABOLIC PANEL
ALT: 10 U/L (ref 0–44)
AST: 21 U/L (ref 15–41)
Albumin: 3.4 g/dL — ABNORMAL LOW (ref 3.5–5.0)
Alkaline Phosphatase: 82 U/L (ref 38–126)
Anion gap: 11 (ref 5–15)
BUN: 79 mg/dL — ABNORMAL HIGH (ref 8–23)
CO2: 32 mmol/L (ref 22–32)
Calcium: 10.5 mg/dL — ABNORMAL HIGH (ref 8.9–10.3)
Chloride: 94 mmol/L — ABNORMAL LOW (ref 98–111)
Creatinine, Ser: 2.37 mg/dL — ABNORMAL HIGH (ref 0.61–1.24)
GFR, Estimated: 30 mL/min — ABNORMAL LOW (ref >60)
Glucose, Bld: 154 mg/dL — ABNORMAL HIGH (ref 70–99)
Potassium: 2.9 mmol/L — ABNORMAL LOW (ref 3.5–5.1)
Sodium: 137 mmol/L (ref 135–145)
Total Bilirubin: 1.1 mg/dL (ref 0.3–1.2)
Total Protein: 7.1 g/dL (ref 6.5–8.1)

## 2021-08-29 LAB — PROTIME-INR
INR: 2 — ABNORMAL HIGH (ref 0.8–1.2)
Prothrombin Time: 23 seconds — ABNORMAL HIGH (ref 11.4–15.2)

## 2021-08-29 LAB — BRAIN NATRIURETIC PEPTIDE: B Natriuretic Peptide: 487.1 pg/mL — ABNORMAL HIGH (ref 0.0–100.0)

## 2021-08-29 NOTE — H&P (View-Only) (Signed)
ADVANCED HF CLINIC CONSULT NOTE  Referring Physician: Rozann Lesches, MD Primary Care: Wilburt Finlay, MD Primary Cardiologist: Rozann Lesches, MD   HPI:  Roberto [name] is a 60 y.o. male referred by Dr. Domenic Polite for further evaluation of pulmonary HTN.   He has h/o morbid obesity, DM2, HTN, OSA on bipap, chronic AF, COPD, DVT/PE, CKD 3b and PAH/cor pulmonale.   Previously followed at [facility]. Has h/o DVTs  VQ scan on 06/22/20 showed wedge-shaped perfusion defects in the right upper, left upper and bilateral lower lobes, which are concerning for possible PE.Same day SPECT CT showed multiple perfusion defects in the bilateral lungs as described above, concerning for PE  RHC in 6/22 at [facility]  RA 22 PA 95/45 (60) PCWP 22 Fick 5.1/1.9 Pulmonary angio by Dr. Denman George - no significant filling defects (limited images)  Admitted to [facility] in 8/22 for RHF in setting of known severe pulmonary hypertension and cor pulmonale. He was started on Adempas, but stopped after 1 week the medication related to substantial weight gain despite continued use of torsemide. Remains on Coumadin given previous intolerances to Eliquis and Xarelto secondary to recurrent nosebleeds.    He saw Dr. Domenic Polite 07/06/21 and referred here. Pending visit at [facility] 10/12/21.   Echo 08/16/21: LVEF 70% with septal flattening RV markedly dilated and HK RVSP 10mHG. Moderate to severe TR, Personally reviewed   Says he feels pretty good. Has lost 90 pounds since 8/22 mostly due to fluid loss. Uses electric scooter to get around for the most part. Going to PT and they are helping him to walk again. Gets SOB with mild activity. Says he hasn't worn Bipap for past few nights because his box spring broke and he is not sleeping at the foot of his bed and can't reach Bipap. Says swelling in his legs is improved. Taking torsemide 60 daily. No further epistaxis. INR being managed by [facility] INR 1.6 -2.1      Review of Systems: [y] = yes, _0  = no   General: Weight gain _1 ; Weight loss [ y]; Anorexia _2 ; Fatigue _3 ; Fever _4 ; Chills _5 ; Weakness _6   Cardiac: Chest pain/pressure _7 ; Resting SOB _8 ; Exertional SOB [ y]; Orthopnea _9 ; Pedal Edema [ y]; Palpitations _10 ; Syncope _11 ; Presyncope _12 ; Paroxysmal nocturnal dyspnea_13   Pulmonary: Cough _14 ; Wheezing_15 ; Hemoptysis_16 ; Sputum _17 ; Snoring _18   GI: Vomiting_19 ; Dysphagia_20 ; Melena_21 ; Hematochezia _22 ; Heartburn_23 ; Abdominal pain _24 ; Constipation _25 ; Diarrhea _26 ; BRBPR _27   GU: Hematuria_28 ; Dysuria _29 ; Nocturia_30   Vascular: Pain in legs with walking _31 ; Pain in feet with lying flat _32 ; Non-healing sores _33 ; Stroke _34 ; TIA _35 ; Slurred speech _36 ;  Neuro: Headaches_37 ; Vertigo_38 ; Seizures_39 ; Paresthesias_40 ;Blurred vision _41 ; Diplopia _42 ; Vision changes _43   Ortho/Skin: Arthritis [ y]; Joint pain _44 y; Muscle pain _45 ; Joint swelling _46 ; Back Pain _47 ; Rash _48   Psych: Depression_49 ; Anxiety_50   Heme: Bleeding problems _51 ; Clotting disorders _52 ; Anemia _53   Endocrine: Diabetes [ y]; Thyroid dysfunction_54    Past Medical History:  Diagnosis Date   Asthma    Atrial fibrillation (HCC)    CKD (chronic kidney disease) stage 3, GFR 30-59 ml/min (HCC)    COPD (chronic obstructive pulmonary disease) ([facility]  COVID-19    Essential hypertension    History of DVT (deep vein thrombosis)    Lung nodule    Nosebleed    Reportedly recurrent on Eliquis and Xarelto   OSA on CPAP    Pulmonary hypertension (HCC)    Respiratory failure with hypoxia (HCC)    Type 2 diabetes mellitus (HCC)     Current Outpatient Medications  Medication Sig Dispense Refill   ACCU-CHEK AVIVA PLUS test strip TEST BLOOD SUGAR TWICE DAILY 200 strip 1   Accu-Chek Softclix Lancets lancets USE TO TEST TWO TIMES DAILY 200 each 3   acetaminophen (TYLENOL) 650 MG CR tablet Take 1,300 mg by mouth every 8 (eight) hours as needed for pain.      Alcohol Swabs (B-D SINGLE USE SWABS REGULAR) PADS USE TWO TIMES DAILY 200 each 3   Blood Glucose Monitoring Suppl (ACCU-CHEK AVIVA PLUS) w/Device KIT TEST TWO TIMES DAILY 1 kit 0   cholecalciferol (VITAMIN D) 1000 units tablet Take 1 tablet (1,000 Units total) by mouth daily. 90 tablet 2   Colchicine 0.6 MG CAPS TAKE 2 CAPSULES BY MOUTH AT FIRST SIGN OF FLARE FOLLOWED BY 1 CAPSULE IN 1-HOUR, THEN 1 CAPSULE BY MOUTH DAILY THEREAFTER     diclofenac Sodium (VOLTAREN) 1 % GEL Apply 4 g topically 4 (four) times daily as needed (joint pain). 100 g 1   Dulaglutide (TRULICITY) 9.09 IO/0.0DI SOPN Inject 0.75 mg into the skin once a week. 2 mL 1   ferrous sulfate 325 (65 FE) MG tablet Take 325 mg by mouth daily.     gabapentin (NEURONTIN) 100 MG capsule TAKE 2 CAPSULES (200MG) TWICE DAILY 60 capsule 0   glipiZIDE (GLUCOTROL) 5 MG tablet Take by mouth daily before breakfast.     metolazone (ZAROXOLYN) 2.5 MG tablet Take 2.5 mg by mouth daily.     OXYGEN Inhale 5 L into the lungs continuous.     oxymetazoline (AFRIN) 0.05 % nasal spray 1 spray See admin instructions. 2 sprays into one nostril as needed for nasal congestion     potassium chloride SA (KLOR-CON) 20 MEQ tablet Take 1 tablet by mouth daily.     PRESCRIPTION MEDICATION Inhale into the lungs as needed (whenever sleeping - naps or at night). CPAP     torsemide (DEMADEX) 20 MG tablet Take 3 tablets (60 mg total) by mouth daily. 270 tablet 2   warfarin (COUMADIN) 2.5 MG tablet Take 2 tablets daily except 3 tablets on Tuesdays, Thursdays and Saturdays or as directed 80 tablet 3   No current facility-administered medications for this encounter.    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    Patient has a few friends there he socializes with.    Patient is divorced with 2 children.   Patient requires 5L of continuous O2.    Social Determinants of Health   Financial Resource Strain: Low Risk    Difficulty of Paying Living Expenses: Not very hard  Food Insecurity: No Food Insecurity   Worried About Charity fundraiser in the Last Year: Never true   Ran Out of Food in the Last Year: Never true  Transportation Needs: No Transportation Needs   Lack of Transportation (Medical): No   Lack of Transportation (Non-Medical): No  Physical Activity: Inactive   Days of Exercise per Week: 0 days   Minutes of Exercise per Session: 0 min  Stress: No Stress Concern Present   Feeling of Stress : Only a little  Social Connections: Moderately Isolated   Frequency of Communication with Friends and Family: More than three times a week   Frequency of Social Gatherings with Friends and Family: More than three times a week   Attends Religious Services: 1 to 4 times per year   Active Member of [employer] or Organizations: No   Attends Music therapist: Never   Marital Status: Divorced  Human resources officer Violence: Not At Risk   Fear of Current or Ex-Partner: No   Emotionally Abused: No   Physically Abused: No   Sexually Abused: No      Family History  Problem Relation Age of Onset   Diabetes Mother    Cancer Mother    Diabetes Father     High blood pressure Father    Diabetes Brother    Diabetes Brother     Vitals:   08/29/21 1439  BP: 102/60  Pulse: 61  SpO2: 96%  Weight: (!) 140.7 kg (310 lb 3.2 oz)    PHYSICAL EXAM: General:  Morbidly obese male in scooter No respiratory difficulty Wearing O2 HEENT: normal Neck: supple. Hard to see JVp due to size mildly elevated  Carotids 2+ bilat; no bruits. No lymphadenopathy or thryomegaly appreciated. Cor: PMI nondisplaced. irregular rate & rhythm. No rubs, gallops or murmurs. Lungs: clear Abdomen: obese soft, nontender, nondistended. No hepatosplenomegaly. No bruits or masses. Good bowel sounds. Extremities: no cyanosis, clubbing, rash, 1-2+ woody edema + compression hose  Neuro: alert & oriented x 3, cranial nerves grossly intact. moves all 4 extremities w/o difficulty. Affect pleasant.  ECG: AF 56 RBBB Personally reviewed   ASSESSMENT & PLAN:  1. PAH/cor pulmonale - suspect primarily WHO Group 3 due to OSA/OHS but previous w/u also suggestive of possible CTEPH. However limited pulmonary angio at [facility] by Dr. Denman George without obvious filling defects.  - Echo 08/16/21: LVEF 70% with septal flattening RV markedly dilated and HK RVSP 40mHG. Moderate to severe TR, Personally reviewed - Volume status now much improved with recent 90 pound diuresis. Continue torsemide. Agree with plan to try to limit metolazone - Will plan repeat RHC and VQ scan. Will also need PFTs - Continue warfarin - stressed need to be complaint BIPAP (will need to see download) - Previously failed Adempas due to volume overload - [facility] - Check labs  2. Chronic AF - rate controlled. - failed DOACs due to epistaxis - continue warfarin  3. Morbid obesity - needs weight loss  4. Chronic hypoxic respiratory failure - multifactorial OSA/OHS/PAH  - continue O2 support  5.  CKD 3b - baseline SCr 1.5-2.0 - recheck today - may benefit from SGLT2i though concern  for perineal hygiene  Total time spent 55 minutes. Over half that time spent discussing above.   Glori Bickers, MD  10:37 PM

## 2021-08-29 NOTE — Patient Instructions (Signed)
Medication Changes:  No change  Lab Work:  Labs done today, your results will be available in MyChart, we will contact you for abnormal readings.   Testing/Procedures:   You are scheduled for a Cardiac Catheterization on Thursday, December 15 with Dr. Glori Bickers.  1. Please arrive at the [facility] (Main Entrance A) at [facility]: [address] [place], [place] [address] at 8:30 AM (This time is two hours before your procedure to ensure your preparation). Free valet parking service is available.   Special note: Every effort is made to have your procedure done on time. Please understand that emergencies sometimes delay scheduled procedures.  2. Diet: Do not eat solid foods after midnight.  The patient may have clear liquids until 5am upon the day of the procedure.  3. Labs: Done 08/29/21  4. Medication instructions in preparation for your procedure:   Contrast Allergy: No   Stop taking, Torsemide (Demadex) Thursday, December 15,   Glipizide  Thursday, December 15,  On the morning of your procedure, take your morning medicines NOT listed above.  You may use sips of water.  5. Plan for one night stay--bring personal belongings. 6. Bring a current list of your medications and current insurance cards. 7. You MUST have a responsible person to drive you home. 8. Someone MUST be with you the first 24 hours after you arrive home or your discharge will be delayed. 9. Please wear clothes that are easy to get on and off and wear slip-on shoes.     Referrals:  none  Special Instructions // Education:  VQ Scan. We will call you to schedule the appointment.  Follow-Up in: 3 months  At the [facility], you and your health needs are our priority. We have a designated team specialized in the treatment of Heart Failure. This Care Team includes your primary Heart Failure Specialized Cardiologist (physician), Advanced Practice Providers (APPs-  Physician Assistants and Nurse Practitioners), and Pharmacist who all work together to provide you with the care you need, when you need it.   You may see any of the following providers on your designated Care Team at your next follow up:  Dr Glori Bickers Dr Haynes Kerns, NP Lyda Jester, [place] [facility], [place] Audry Riles, PharmD   Please be sure to bring in all your medications bottles to every appointment.   Need to Contact us:  If you have any questions or concerns before your next appointment please send [place] a message through [place] or call our office at [phone].    TO LEAVE A MESSAGE FOR THE NURSE SELECT OPTION 2, PLEASE LEAVE A MESSAGE INCLUDING: YOUR NAME DATE OF BIRTH CALL BACK NUMBER REASON FOR CALL**this is important as we prioritize the call backs  YOU WILL RECEIVE A CALL BACK THE SAME DAY AS LONG AS YOU CALL BEFORE 4:00 PM

## 2021-08-29 NOTE — Progress Notes (Signed)
° °ADVANCED HF CLINIC CONSULT NOTE ° °Referring Physician: Samuel McDowell, MD °Primary Care: Kotturi, Vinay K, MD °Primary Cardiologist: Samuel McDowell, MD ° ° °HPI: ° °Roberto [name] is a 60 y.o. male referred by Dr. McDowell for further evaluation of pulmonary HTN.  ° °He has h/o morbid obesity, DM2, HTN, OSA on bipap, chronic AF, COPD, DVT/PE, CKD 3b and PAH/cor pulmonale.  ° °Previously followed at [facility]. Has h/o DVTs ° °VQ scan on 06/22/20 showed wedge-shaped perfusion defects in the right upper, left upper and bilateral lower lobes, which are concerning for possible PE.Same day SPECT CT showed multiple perfusion defects in the bilateral lungs as described above, concerning for PE ° °RHC in 6/22 at [facility] ° °RA 22 °PA 95/45 (60) °PCWP 22 °Fick 5.1/1.9 °Pulmonary angio by Dr. Rossi - no significant filling defects (limited images) ° °Admitted to [facility] in 8/22 for RHF in setting of known severe pulmonary hypertension and cor pulmonale. He was started on Adempas, but stopped after 1 week the medication related to substantial weight gain despite continued use of torsemide. Remains on Coumadin given previous intolerances to Eliquis and Xarelto secondary to recurrent nosebleeds.   ° °He saw Dr. McDowell 07/06/21 and referred here. Pending visit at [facility] 10/12/21.  ° °Echo 08/16/21: LVEF 70% with septal flattening RV markedly dilated and HK RVSP 58mmHG. Moderate to severe TR, Personally reviewed ° ° °Says he feels pretty good. Has lost 90 pounds since 8/22 mostly due to fluid loss. Uses electric scooter to get around for the most part. Going to PT and they are helping him to walk again. Gets SOB with mild activity. Says he hasn't worn Bipap for past few nights because his box spring broke and he is not sleeping at the foot of his bed and can't reach Bipap. Says swelling in his legs is improved. Taking torsemide 60 daily. No further epistaxis. INR being managed by [facility] INR 1.6 -2.1   ° ° ° °Review of Systems: [y] = yes, [ ] = no  ° °General: Weight gain [ ]; Weight loss [ y]; Anorexia [ ]; Fatigue [ ]; Fever [ ]; Chills [ ]; Weakness [ ]  °Cardiac: Chest pain/pressure [ ]; Resting SOB [ ]; Exertional SOB [ y]; Orthopnea [ ]; Pedal Edema [ y]; Palpitations [ ]; Syncope [ ]; Presyncope [ ]; Paroxysmal nocturnal dyspnea[ ]  °Pulmonary: Cough [ ]; Wheezing[ ]; Hemoptysis[ ]; Sputum [ ]; Snoring [ ]  °GI: Vomiting[ ]; Dysphagia[ ]; Melena[ ]; Hematochezia [ ]; Heartburn[ ]; Abdominal pain [ ]; Constipation [ ]; Diarrhea [ ]; BRBPR [ ]  °GU: Hematuria[ ]; Dysuria [ ]; Nocturia[ ]  °Vascular: Pain in legs with walking [ ]; Pain in feet with lying flat [ ]; Non-healing sores [ ]; Stroke [ ]; TIA [ ]; Slurred speech [ ];  °Neuro: Headaches[ ]; Vertigo[ ]; Seizures[ ]; Paresthesias[ ];Blurred vision [ ]; Diplopia [ ]; Vision changes [ ]  °Ortho/Skin: Arthritis [ y]; Joint pain [ ]y; Muscle pain [ ]; Joint swelling [ ]; Back Pain [ ]; Rash [ ]  °Psych: Depression[ ]; Anxiety[ ]  °Heme: Bleeding problems [ ]; Clotting disorders [ ]; Anemia [ ]  °Endocrine: Diabetes [ y]; Thyroid dysfunction[ ] ° ° °Past Medical History:  °Diagnosis Date  ° Asthma   ° Atrial fibrillation (HCC)   ° CKD (chronic kidney disease) stage 3, GFR 30-59 ml/min (HCC)   ° COPD (chronic obstructive pulmonary disease) (HCC)   °   COVID-19   ° Essential hypertension   ° History of DVT (deep vein thrombosis)   ° Lung nodule   ° Nosebleed   ° Reportedly recurrent on Eliquis and Xarelto  ° OSA on CPAP   ° Pulmonary hypertension (HCC)   ° Respiratory failure with hypoxia (HCC)   ° Type 2 diabetes mellitus (HCC)   ° ° °Current Outpatient Medications  °Medication Sig Dispense Refill  ° ACCU-CHEK AVIVA PLUS test strip TEST BLOOD SUGAR TWICE DAILY 200 strip 1  ° Accu-Chek Softclix Lancets lancets USE TO TEST TWO TIMES DAILY 200 each 3  ° acetaminophen (TYLENOL) 650 MG CR tablet Take 1,300 mg by mouth every 8 (eight) hours as needed for pain.    °  Alcohol Swabs (B-D SINGLE USE SWABS REGULAR) PADS USE TWO TIMES DAILY 200 each 3  ° Blood Glucose Monitoring Suppl (ACCU-CHEK AVIVA PLUS) w/Device KIT TEST TWO TIMES DAILY 1 kit 0  ° cholecalciferol (VITAMIN D) 1000 units tablet Take 1 tablet (1,000 Units total) by mouth daily. 90 tablet 2  ° Colchicine 0.6 MG CAPS TAKE 2 CAPSULES BY MOUTH AT FIRST SIGN OF FLARE FOLLOWED BY 1 CAPSULE IN 1-HOUR, THEN 1 CAPSULE BY MOUTH DAILY THEREAFTER    ° diclofenac Sodium (VOLTAREN) 1 % GEL Apply 4 g topically 4 (four) times daily as needed (joint pain). 100 g 1  ° Dulaglutide (TRULICITY) 0.75 MG/0.5ML SOPN Inject 0.75 mg into the skin once a week. 2 mL 1  ° ferrous sulfate 325 (65 FE) MG tablet Take 325 mg by mouth daily.    ° gabapentin (NEURONTIN) 100 MG capsule TAKE 2 CAPSULES (200MG) TWICE DAILY 60 capsule 0  ° glipiZIDE (GLUCOTROL) 5 MG tablet Take by mouth daily before breakfast.    ° metolazone (ZAROXOLYN) 2.5 MG tablet Take 2.5 mg by mouth daily.    ° OXYGEN Inhale 5 L into the lungs continuous.    ° oxymetazoline (AFRIN) 0.05 % nasal spray 1 spray See admin instructions. 2 sprays into one nostril as needed for nasal congestion    ° potassium chloride SA (KLOR-CON) 20 MEQ tablet Take 1 tablet by mouth daily.    ° PRESCRIPTION MEDICATION Inhale into the lungs as needed (whenever sleeping - naps or at night). CPAP    ° torsemide (DEMADEX) 20 MG tablet Take 3 tablets (60 mg total) by mouth daily. 270 tablet 2  ° warfarin (COUMADIN) 2.5 MG tablet Take 2 tablets daily except 3 tablets on Tuesdays, Thursdays and Saturdays or as directed 80 tablet 3  ° °No current facility-administered medications for this encounter.  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   ° Patient has a few friends there he socializes with.   ° Patient is divorced with 2 children.  ° Patient requires 5L of continuous O2.   ° °Social Determinants of Health  ° °Financial Resource Strain: Low Risk   ° Difficulty of Paying Living Expenses: Not very hard  °Food Insecurity: No Food Insecurity  ° Worried About Running Out of Food in the Last Year: Never true  ° Ran Out of Food in the Last Year: Never true  °Transportation Needs: No Transportation Needs  ° Lack of Transportation (Medical): No  ° Lack of Transportation (Non-Medical): No  °Physical Activity: Inactive  ° Days of Exercise per Week: 0 days  ° Minutes of Exercise per Session: 0 min  °Stress: No Stress Concern Present  ° Feeling of Stress : Only a little  °Social Connections: Moderately Isolated  ° Frequency of Communication with Friends and Family: More than three times a week  ° Frequency of Social Gatherings with Friends and Family: More than three times a week  ° Attends Religious Services: 1 to 4 times per year  ° Active Member of Clubs or Organizations: No  ° Attends Club or Organization Meetings: Never  ° Marital Status: Divorced  °Intimate Partner Violence: Not At Risk  ° Fear of Current or Ex-Partner: No  ° Emotionally Abused: No  ° Physically Abused: No  ° Sexually Abused: No  ° ° °  °Family History  °Problem Relation Age of Onset  ° Diabetes Mother   ° Cancer Mother   ° Diabetes Father   °  High blood pressure Father   ° Diabetes Brother   ° Diabetes Brother   ° ° °Vitals:  ° 08/29/21 1439  °BP: 102/60  °Pulse: 61  °SpO2: 96%  °Weight: (!) 140.7 kg (310 lb 3.2 oz)  ° ° °PHYSICAL EXAM: °General:  Morbidly obese male in scooter No respiratory difficulty Wearing O2 °HEENT: normal °Neck: supple. Hard to see JVp due to size mildly elevated  Carotids 2+ bilat; no bruits. No lymphadenopathy or thryomegaly appreciated. °Cor: PMI nondisplaced. irregular rate & rhythm. No rubs, gallops or murmurs. °Lungs: clear °Abdomen: obese soft, nontender, nondistended. No hepatosplenomegaly. No bruits or masses. Good bowel sounds. °Extremities: no cyanosis, clubbing, rash, 1-2+ woody edema + compression hose  °Neuro: alert & oriented x 3, cranial nerves grossly intact. moves all 4 extremities w/o difficulty. Affect pleasant. ° °ECG: AF 56 RBBB Personally reviewed ° ° °ASSESSMENT & PLAN: ° °1. PAH/cor pulmonale °- suspect primarily WHO Group 3 due to OSA/OHS but previous w/u also suggestive of possible CTEPH. However limited pulmonary angio at [facility] by Dr. Rossi without obvious filling defects.  °- Echo 08/16/21: LVEF 70% with septal flattening RV markedly dilated and HK RVSP 58mmHG. Moderate to severe TR, Personally reviewed °- Volume status now much improved with recent 90 pound diuresis. Continue torsemide. Agree with plan to try to limit metolazone °- Will plan repeat RHC and VQ scan. Will also need PFTs °- Continue warfarin °- stressed need to be complaint BIPAP (will need to see download) °- Previously failed Adempas due to volume overload °- Has consult with Dr. Parikh at [facility] in 1/23 °- Check labs ° °2. Chronic AF °- rate controlled. °- failed DOACs due to epistaxis °- continue warfarin ° °3. Morbid obesity °- needs weight loss ° °4. Chronic hypoxic respiratory failure °- multifactorial OSA/OHS/PAH  °- continue O2 support ° °5.   CKD 3b °- baseline SCr 1.5-2.0 °- recheck today °- may benefit from SGLT2i though concern  for perineal hygiene ° °Total time spent 55 minutes. Over half that time spent discussing above.  ° °Keilani Terrance, MD  °10:37 PM ° ° °

## 2021-08-30 ENCOUNTER — Telehealth (HOSPITAL_COMMUNITY): Payer: Self-pay | Admitting: *Deleted

## 2021-08-30 DIAGNOSIS — I272 Pulmonary hypertension, unspecified: Secondary | ICD-10-CM

## 2021-08-30 MED ORDER — POTASSIUM CHLORIDE CRYS ER 20 MEQ PO TBCR: 60.0000 meq | EXTENDED_RELEASE_TABLET | Freq: Every day | ORAL | 3 refills | Status: AC

## 2021-08-30 NOTE — Telephone Encounter (Signed)
Roberto [name], [place]  08/30/2021  4:53 PM EST Back to Top    Spoke with pt he is aware and agreeable with plan. Lab appt scheduled.    Jolaine Artist, MD  08/29/2021  9:58 PM EST     Is he taking his Kcl 20 daily? If so need to increase to 60 daily (3 tabs per day). Have him take 5 tabs of potassium tomorrow. Repeat BMET on Friday

## 2021-08-31 ENCOUNTER — Telehealth: Payer: Self-pay | Admitting: *Deleted

## 2021-08-31 ENCOUNTER — Ambulatory Visit (INDEPENDENT_AMBULATORY_CARE_PROVIDER_SITE_OTHER): Payer: Medicare HMO | Admitting: *Deleted

## 2021-08-31 DIAGNOSIS — I824Y2 Acute embolism and thrombosis of unspecified deep veins of left proximal lower extremity: Secondary | ICD-10-CM

## 2021-08-31 DIAGNOSIS — Z5181 Encounter for therapeutic drug level monitoring: Secondary | ICD-10-CM | POA: Diagnosis not present

## 2021-08-31 LAB — POCT INR: INR: 2.4 (ref 2.0–3.0)

## 2021-08-31 NOTE — Telephone Encounter (Signed)
Called pt.  Instructed him to continue taking warfarin at current dose prior to heart cath.  Dr Haroldine Laws does not want warfarin stopped.  Pt verbalized understanding.

## 2021-08-31 NOTE — Patient Instructions (Signed)
Continue warfarin 2 tablets (27m) daily except 3 tablets (7.581m on Tuesdays, Thursdays and Saturdays Pending Rt heart cath on 12/15.  Instructiions did not say anything about holding warfarin.  Message sent to Dr Bensimhon's nurse for clarification.

## 2021-08-31 NOTE — Telephone Encounter (Signed)
-----  Message from Jerl Mina, RN sent at 08/31/2021  2:31 PM EST ----- Dr. Haroldine Laws wants him to keep taking it and not to hold it. ----- Message ----- From: Malen Gauze, RN Sent: 08/31/2021   2:05 PM EST To: Jerl Mina, RN  Hi.  Seeing pt for INR check today.  See where he is scheduled for heart cath on 12/15.  Instructions did not tell pt to hold warfarin.  Please advise.  Thanks, Lattie Haw

## 2021-09-02 ENCOUNTER — Other Ambulatory Visit: Payer: Self-pay

## 2021-09-02 ENCOUNTER — Telehealth (HOSPITAL_COMMUNITY): Payer: Self-pay | Admitting: *Deleted

## 2021-09-02 ENCOUNTER — Ambulatory Visit (HOSPITAL_COMMUNITY)
Admission: RE | Admit: 2021-09-02 | Discharge: 2021-09-02 | Disposition: A | Discharge status: Home / Self Care | Payer: Medicare HMO | Source: Ambulatory Visit | Attending: Cardiology | Admitting: Cardiology

## 2021-09-02 DIAGNOSIS — I272 Pulmonary hypertension, unspecified: Secondary | ICD-10-CM | POA: Diagnosis not present

## 2021-09-02 LAB — BASIC METABOLIC PANEL
Anion gap: 10 (ref 5–15)
BUN: 73 mg/dL — ABNORMAL HIGH (ref 8–23)
CO2: 33 mmol/L — ABNORMAL HIGH (ref 22–32)
Calcium: 10.7 mg/dL — ABNORMAL HIGH (ref 8.9–10.3)
Chloride: 92 mmol/L — ABNORMAL LOW (ref 98–111)
Creatinine, Ser: 2.31 mg/dL — ABNORMAL HIGH (ref 0.61–1.24)
GFR, Estimated: 31 mL/min — ABNORMAL LOW (ref >60)
Glucose, Bld: 106 mg/dL — ABNORMAL HIGH (ref 70–99)
Potassium: 3.9 mmol/L (ref 3.5–5.1)
Sodium: 135 mmol/L (ref 135–145)

## 2021-09-08 ENCOUNTER — Ambulatory Visit (HOSPITAL_COMMUNITY)
Admission: RE | Admit: 2021-09-08 | Discharge: 2021-09-08 | Disposition: A | Discharge status: Home / Self Care | Payer: Medicare HMO | Source: Ambulatory Visit | Attending: Internal Medicine | Admitting: Internal Medicine

## 2021-09-08 ENCOUNTER — Ambulatory Visit (HOSPITAL_COMMUNITY)
Admission: RE | Admit: 2021-09-08 | Discharge: 2021-09-08 | Disposition: A | Discharge status: Home / Self Care | Payer: Medicare HMO | Attending: Internal Medicine | Admitting: Internal Medicine

## 2021-09-08 ENCOUNTER — Encounter (HOSPITAL_COMMUNITY)
Admission: RE | Disposition: A | Discharge status: Home / Self Care | Payer: Self-pay | Source: Home / Self Care | Attending: Internal Medicine

## 2021-09-08 ENCOUNTER — Telehealth (HOSPITAL_COMMUNITY): Payer: Self-pay | Admitting: Surgery

## 2021-09-08 DIAGNOSIS — N1832 Chronic kidney disease, stage 3b: Secondary | ICD-10-CM | POA: Diagnosis not present

## 2021-09-08 DIAGNOSIS — R001 Bradycardia, unspecified: Secondary | ICD-10-CM | POA: Diagnosis not present

## 2021-09-08 DIAGNOSIS — I272 Pulmonary hypertension, unspecified: Secondary | ICD-10-CM

## 2021-09-08 DIAGNOSIS — J449 Chronic obstructive pulmonary disease, unspecified: Secondary | ICD-10-CM | POA: Diagnosis not present

## 2021-09-08 DIAGNOSIS — G4733 Obstructive sleep apnea (adult) (pediatric): Secondary | ICD-10-CM | POA: Insufficient documentation

## 2021-09-08 DIAGNOSIS — Z6841 Body Mass Index (BMI) 40.0 and over, adult: Secondary | ICD-10-CM | POA: Diagnosis not present

## 2021-09-08 DIAGNOSIS — I2721 Secondary pulmonary arterial hypertension: Secondary | ICD-10-CM | POA: Diagnosis present

## 2021-09-08 DIAGNOSIS — I4891 Unspecified atrial fibrillation: Secondary | ICD-10-CM

## 2021-09-08 DIAGNOSIS — I482 Chronic atrial fibrillation, unspecified: Secondary | ICD-10-CM | POA: Insufficient documentation

## 2021-09-08 DIAGNOSIS — Z7901 Long term (current) use of anticoagulants: Secondary | ICD-10-CM | POA: Diagnosis not present

## 2021-09-08 DIAGNOSIS — E1122 Type 2 diabetes mellitus with diabetic chronic kidney disease: Secondary | ICD-10-CM | POA: Insufficient documentation

## 2021-09-08 DIAGNOSIS — J9611 Chronic respiratory failure with hypoxia: Secondary | ICD-10-CM | POA: Diagnosis not present

## 2021-09-08 DIAGNOSIS — I129 Hypertensive chronic kidney disease with stage 1 through stage 4 chronic kidney disease, or unspecified chronic kidney disease: Secondary | ICD-10-CM | POA: Insufficient documentation

## 2021-09-08 HISTORY — PX: RIGHT HEART CATH: CATH118263

## 2021-09-08 LAB — POCT I-STAT, CHEM 8
BUN: 64 mg/dL — ABNORMAL HIGH (ref 8–23)
Calcium, Ion: 1.24 mmol/L (ref 1.15–1.40)
Chloride: 96 mmol/L — ABNORMAL LOW (ref 98–111)
Creatinine, Ser: 2.2 mg/dL — ABNORMAL HIGH (ref 0.61–1.24)
Glucose, Bld: 88 mg/dL (ref 70–99)
HCT: 45 % (ref 39.0–52.0)
Hemoglobin: 15.3 g/dL (ref 13.0–17.0)
Potassium: 3.1 mmol/L — ABNORMAL LOW (ref 3.5–5.1)
Sodium: 137 mmol/L (ref 135–145)
TCO2: 33 mmol/L — ABNORMAL HIGH (ref 22–32)

## 2021-09-08 LAB — POCT I-STAT EG7
Acid-Base Excess: 2 mmol/L (ref 0.0–2.0)
Acid-Base Excess: 5 mmol/L — ABNORMAL HIGH (ref 0.0–2.0)
Bicarbonate: 28.2 mmol/L — ABNORMAL HIGH (ref 20.0–28.0)
Bicarbonate: 31.9 mmol/L — ABNORMAL HIGH (ref 20.0–28.0)
Calcium, Ion: 1.04 mmol/L — ABNORMAL LOW (ref 1.15–1.40)
Calcium, Ion: 1.32 mmol/L (ref 1.15–1.40)
HCT: 41 % (ref 39.0–52.0)
HCT: 45 % (ref 39.0–52.0)
Hemoglobin: 13.9 g/dL (ref 13.0–17.0)
Hemoglobin: 15.3 g/dL (ref 13.0–17.0)
O2 Saturation: 62 %
O2 Saturation: 65 %
Potassium: 2.5 mmol/L — CL (ref 3.5–5.1)
Potassium: 3.1 mmol/L — ABNORMAL LOW (ref 3.5–5.1)
Sodium: 137 mmol/L (ref 135–145)
Sodium: 143 mmol/L (ref 135–145)
TCO2: 30 mmol/L (ref 22–32)
TCO2: 34 mmol/L — ABNORMAL HIGH (ref 22–32)
pCO2, Ven: 46.8 mmHg (ref 44.0–60.0)
pCO2, Ven: 52.6 mmHg (ref 44.0–60.0)
pH, Ven: 7.388 (ref 7.250–7.430)
pH, Ven: 7.392 (ref 7.250–7.430)
pO2, Ven: 33 mmHg (ref 32.0–45.0)
pO2, Ven: 35 mmHg (ref 32.0–45.0)

## 2021-09-08 LAB — BASIC METABOLIC PANEL
Anion gap: 13 (ref 5–15)
BUN: 65 mg/dL — ABNORMAL HIGH (ref 8–23)
CO2: 31 mmol/L (ref 22–32)
Calcium: 10.4 mg/dL — ABNORMAL HIGH (ref 8.9–10.3)
Chloride: 94 mmol/L — ABNORMAL LOW (ref 98–111)
Creatinine, Ser: 2.36 mg/dL — ABNORMAL HIGH (ref 0.61–1.24)
GFR, Estimated: 31 mL/min — ABNORMAL LOW (ref >60)
Glucose, Bld: 90 mg/dL (ref 70–99)
Potassium: 3.1 mmol/L — ABNORMAL LOW (ref 3.5–5.1)
Sodium: 138 mmol/L (ref 135–145)

## 2021-09-08 LAB — GLUCOSE, CAPILLARY
Glucose-Capillary: 80 mg/dL (ref 70–99)
Glucose-Capillary: 98 mg/dL (ref 70–99)
Glucose-Capillary: 56 mg/dL — ABNORMAL LOW (ref 70–99)
Glucose-Capillary: 72 mg/dL (ref 70–99)

## 2021-09-08 LAB — PROTIME-INR
INR: 2.3 — ABNORMAL HIGH (ref 0.8–1.2)
Prothrombin Time: 25.1 seconds — ABNORMAL HIGH (ref 11.4–15.2)

## 2021-09-08 LAB — MAGNESIUM: Magnesium: 1.7 mg/dL (ref 1.7–2.4)

## 2021-09-08 SURGERY — RIGHT HEART CATH
Anesthesia: LOCAL

## 2021-09-08 MED ORDER — HEPARIN (PORCINE) IN NACL 1000-0.9 UT/500ML-% IV SOLN
INTRAVENOUS | Status: AC
  Filled 2021-09-08: qty 1000

## 2021-09-08 MED ORDER — SODIUM CHLORIDE 0.9 % IV SOLN: 250.0000 mL | INTRAVENOUS | Status: DC | PRN

## 2021-09-08 MED ORDER — SODIUM CHLORIDE 0.9% FLUSH: 3.0000 mL | Freq: Two times a day (BID) | INTRAVENOUS | Status: DC

## 2021-09-08 MED ORDER — LIDOCAINE HCL (PF) 1 % IJ SOLN
INTRAMUSCULAR | Status: DC | PRN
  Administered 2021-09-08: 2 mL via INTRADERMAL

## 2021-09-08 MED ORDER — SODIUM CHLORIDE 0.9% FLUSH: 3.0000 mL | INTRAVENOUS | Status: DC | PRN

## 2021-09-08 MED ORDER — SODIUM CHLORIDE 0.9 % IV SOLN: INTRAVENOUS | Status: DC

## 2021-09-08 MED ORDER — HEPARIN (PORCINE) IN NACL 2000-0.9 UNIT/L-% IV SOLN
INTRAVENOUS | Status: DC | PRN
  Administered 2021-09-08: 1000 mL

## 2021-09-08 MED ORDER — LIDOCAINE HCL (PF) 1 % IJ SOLN
INTRAMUSCULAR | Status: AC
  Filled 2021-09-08: qty 30

## 2021-09-08 MED ORDER — ASPIRIN 81 MG PO CHEW
81.0000 mg | CHEWABLE_TABLET | ORAL | Status: AC
  Administered 2021-09-08: 81 mg via ORAL
  Filled 2021-09-08: qty 1

## 2021-09-08 SURGICAL SUPPLY — 9 items
CATH BALLN WEDGE 5F 110CM (CATHETERS) ×1 IMPLANT
CATH SWAN GANZ 7F STRAIGHT (CATHETERS) ×1 IMPLANT
GLIDESHEATH SLENDER 7FR .021G (SHEATH) ×1 IMPLANT
GUIDEWIRE INQWIRE 1.5J.035X260 (WIRE) IMPLANT
INQWIRE 1.5J .035X260CM (WIRE)
PACK CARDIAC CATHETERIZATION (CUSTOM PROCEDURE TRAY) ×2 IMPLANT
SHEATH GLIDE SLENDER 4/5FR (SHEATH) ×1 IMPLANT
TRANSDUCER W/STOPCOCK (MISCELLANEOUS) ×2 IMPLANT
WIRE EMERALD 3MM-J .025X260CM (WIRE) ×1 IMPLANT

## 2021-09-08 NOTE — Interval H&P Note (Signed)
History and Physical Interval Note:  09/08/2021 10:34 AM  [name]  has presented today for surgery, with the diagnosis of heart failure.  The various methods of treatment have been discussed with the patient and family. After consideration of risks, benefits and other options for treatment, the patient has consented to  Procedure(s): RIGHT HEART CATH (N/A) as a surgical intervention.  The patient's history has been reviewed, patient examined, no change in status, stable for surgery.  I have reviewed the patient's chart and labs.  Questions were answered to the patient's satisfaction.     [name]

## 2021-09-08 NOTE — Telephone Encounter (Signed)
Patient in short stay holding after cardiac cath.  I was asked by Dr. Haroldine Laws to place Zio 14 day home monitor.  Monitor placed and patient given instructions.  Order placed in [place].

## 2021-09-09 ENCOUNTER — Encounter (HOSPITAL_COMMUNITY): Payer: Self-pay | Admitting: Internal Medicine

## 2021-09-10 ENCOUNTER — Other Ambulatory Visit: Payer: Self-pay | Admitting: Family Medicine

## 2021-09-10 DIAGNOSIS — E1142 Type 2 diabetes mellitus with diabetic polyneuropathy: Secondary | ICD-10-CM

## 2021-09-14 ENCOUNTER — Other Ambulatory Visit (HOSPITAL_COMMUNITY): Payer: Self-pay

## 2021-09-14 ENCOUNTER — Telehealth (HOSPITAL_COMMUNITY): Payer: Self-pay | Admitting: Vascular Surgery

## 2021-09-14 DIAGNOSIS — I272 Pulmonary hypertension, unspecified: Secondary | ICD-10-CM

## 2021-09-14 NOTE — Telephone Encounter (Signed)
Left pt message giving VQ scan appt , asked pt to call back to confirm

## 2021-09-15 ENCOUNTER — Telehealth: Payer: Self-pay | Admitting: Cardiology

## 2021-09-15 NOTE — Telephone Encounter (Signed)
Patient is asking that you give him a call back

## 2021-09-15 NOTE — Telephone Encounter (Signed)
Pt called to reschedule appt.  Had INR checked IN ED on 12/15.  INR was 2.3.  Appt moved to 09/27/21 @ 2:30pm.

## 2021-09-22 ENCOUNTER — Other Ambulatory Visit: Payer: Self-pay

## 2021-09-22 ENCOUNTER — Ambulatory Visit (HOSPITAL_COMMUNITY)
Admission: RE | Admit: 2021-09-22 | Discharge: 2021-09-22 | Disposition: A | Discharge status: Home / Self Care | Payer: Medicare HMO | Source: Ambulatory Visit | Attending: Internal Medicine | Admitting: Internal Medicine

## 2021-09-22 DIAGNOSIS — I272 Pulmonary hypertension, unspecified: Secondary | ICD-10-CM | POA: Insufficient documentation

## 2021-09-22 MED ORDER — TECHNETIUM TO 99M ALBUMIN AGGREGATED
4.2000 | Freq: Once | INTRAVENOUS | Status: AC | PRN
  Administered 2021-09-22: 11:00:00 4.2 via INTRAVENOUS

## 2021-09-27 ENCOUNTER — Ambulatory Visit (INDEPENDENT_AMBULATORY_CARE_PROVIDER_SITE_OTHER): Payer: Medicare HMO | Admitting: *Deleted

## 2021-09-27 DIAGNOSIS — I4891 Unspecified atrial fibrillation: Secondary | ICD-10-CM

## 2021-09-27 DIAGNOSIS — Z5181 Encounter for therapeutic drug level monitoring: Secondary | ICD-10-CM

## 2021-09-27 DIAGNOSIS — I824Y2 Acute embolism and thrombosis of unspecified deep veins of left proximal lower extremity: Secondary | ICD-10-CM

## 2021-09-27 LAB — POCT INR: INR: 2.4 (ref 2.0–3.0)

## 2021-09-27 NOTE — Patient Instructions (Signed)
Continue warfarin 2 tablets (28m) daily except 3 tablets (7.584m on Tuesdays, Thursdays and Saturdays Recheck INR in 4 weeks

## 2021-09-29 ENCOUNTER — Telehealth (HOSPITAL_COMMUNITY): Payer: Self-pay | Admitting: *Deleted

## 2021-09-29 NOTE — Telephone Encounter (Signed)
Zio called w/ abnormal report  Afib 39bpm for 60sec  Pg.10 & 11 of report  Strip 10   Routed to [place] and [employer]

## 2021-10-10 ENCOUNTER — Ambulatory Visit: Payer: Medicare HMO | Admitting: Cardiology

## 2021-10-11 ENCOUNTER — Ambulatory Visit: Payer: Medicare HMO | Admitting: Cardiology

## 2021-10-11 NOTE — Progress Notes (Deleted)
Cardiology Office Note  Date: 10/11/2021   ID: Roberto [name], DOB [birth year], MRN [id number]  PCP:  Wilburt Finlay, MD  Cardiologist:  Rozann Lesches, MD Electrophysiologist:  None   No chief complaint on file.   History of Present Illness: Roberto [name] is a 61 y.o. male last seen in October 2022.  He had interval evaluation by Dr. Haroldine Laws for assessment of pulmonary hypertension, I reviewed the chart and testing.  Past Medical History:  Diagnosis Date   Asthma    Atrial fibrillation (HCC)    CKD (chronic kidney disease) stage 3, GFR 30-59 ml/min (HCC)    COPD (chronic obstructive pulmonary disease) ([place])    COVID-19    Essential hypertension    History of DVT (deep vein thrombosis)    Lung nodule    Nosebleed    Reportedly recurrent on Eliquis and Xarelto   OSA on CPAP    Pulmonary hypertension (HCC)    Respiratory failure with hypoxia (HCC)    Type 2 diabetes mellitus ([place])     Past Surgical History:  Procedure Laterality Date   RIGHT HEART CATH Right 2022   RIGHT HEART CATH N/A 09/08/2021   Procedure: RIGHT HEART CATH;  Surgeon: Jolaine Artist, MD;  Location: [facility];  Service: Cardiovascular;  Laterality: N/A;   SHOULDER SURGERY      Current Outpatient Medications  Medication Sig Dispense Refill   ACCU-CHEK AVIVA PLUS test strip TEST BLOOD SUGAR TWICE DAILY 200 strip 1   Accu-Chek Softclix Lancets lancets USE TO TEST TWO TIMES DAILY 200 each 3   acetaminophen (TYLENOL) 500 MG tablet Take 1,000 mg by mouth in the morning.     Alcohol Swabs (B-D SINGLE USE SWABS REGULAR) PADS USE TWO TIMES DAILY 200 each 3   Blood Glucose Monitoring Suppl (ACCU-CHEK AVIVA PLUS) w/Device KIT TEST TWO TIMES DAILY 1 kit 0   cholecalciferol (VITAMIN D) 1000 units tablet Take 1 tablet (1,000 Units total) by mouth daily. 90 tablet 2   Colchicine 0.6 MG CAPS Take 0.6 mg by mouth See admin instructions. Take 1 capsule (0.6 mg) by mouth scheduled every morning,  may take an additional capsule if needed for gout flare ups     diclofenac Sodium (VOLTAREN) 1 % GEL Apply 4 g topically 4 (four) times daily as needed (joint pain). 100 g 1   Dulaglutide (TRULICITY) 6.22 QJ/3.3LK SOPN Inject 0.75 mg into the skin once a week. (Patient taking differently: Inject 0.75 mg into the skin every Wednesday.) 2 mL 1   ferrous sulfate 325 (65 FE) MG tablet Take 325 mg by mouth in the morning.     gabapentin (NEURONTIN) 100 MG capsule TAKE 2 CAPSULES (200MG) TWICE DAILY (Patient taking differently: 200 mg in the morning.) 60 capsule 0   glipiZIDE (GLUCOTROL) 5 MG tablet Take 5 mg by mouth daily before breakfast.     metolazone (ZAROXOLYN) 2.5 MG tablet Take 2.5 mg by mouth every evening.     OXYGEN Inhale 5 L into the lungs continuous.     oxymetazoline (AFRIN) 0.05 % nasal spray 1 spray See admin instructions. 2 sprays into one nostril as needed for nasal congestion     potassium chloride SA (KLOR-CON M) 20 MEQ tablet Take 3 tablets (60 mEq total) by mouth daily. 270 tablet 3   PRESCRIPTION MEDICATION Inhale into the lungs as needed (whenever sleeping - naps or at night). CPAP     torsemide (DEMADEX) 20 MG tablet  Take 3 tablets (60 mg total) by mouth daily. (Patient taking differently: Take 60 mg by mouth every evening.) 270 tablet 2   warfarin (COUMADIN) 2.5 MG tablet Take 2 tablets daily except 3 tablets on Tuesdays, Thursdays and Saturdays or as directed 80 tablet 3   No current facility-administered medications for this visit.   Allergies:  Bacitracin-neomycin-polymyxin and Pneumococcal 13-val conj vacc   Social History: The patient  reports that he has never smoked. He has never used smokeless tobacco. He reports that he does not drink alcohol and does not use drugs.   Family History: The patient's family history includes Cancer in his mother; Diabetes in his brother, brother, father, and mother; High blood pressure in his father.   ROS:  Please see the history of  present illness. Otherwise, complete review of systems is positive for {NONE DEFAULTED:18576}.  All other systems are reviewed and negative.   Physical Exam: VS:  There were no vitals taken for this visit., BMI There is no height or weight on file to calculate BMI.  Wt Readings from Last 3 Encounters:  09/08/21 295 lb (133.8 kg)  08/29/21 (!) 310 lb 3.2 oz (140.7 kg)  07/06/21 (!) 321 lb 6.4 oz (145.8 kg)    General: Patient appears comfortable at rest. HEENT: Conjunctiva and lids normal, oropharynx clear with moist mucosa. Neck: Supple, no elevated JVP or carotid bruits, no thyromegaly. Lungs: Clear to auscultation, nonlabored breathing at rest. Cardiac: Regular rate and rhythm, no S3 or significant systolic murmur, no pericardial rub. Abdomen: Soft, nontender, no hepatomegaly, bowel sounds present, no guarding or rebound. Extremities: No pitting edema, distal pulses 2+. Skin: Warm and dry. Musculoskeletal: No kyphosis. Neuropsychiatric: Alert and oriented x3, affect grossly appropriate.  ECG:  An ECG dated 08/29/2021 was personally reviewed today and demonstrated:  Atrial fibrillation at 56 bpm, right bundle branch block.  Recent Labwork: 08/29/2021: ALT 10; AST 21; B Natriuretic Peptide 487.1; Platelets 230 09/08/2021: BUN 64; Creatinine, Ser 2.20; Hemoglobin 15.3; Magnesium 1.7; Potassium 3.1; Sodium 137     Component Value Date/Time   CHOL 137 12/10/2020 1618   TRIG 77 12/10/2020 1618   HDL 27 (L) 12/10/2020 1618   CHOLHDL 5.1 (H) 12/10/2020 1618   [place] 95 12/10/2020 1618    Other Studies Reviewed Today:  Echocardiogram 08/16/2021:  1. Left ventricular ejection fraction, by estimation, is 70 to 75%. The  left ventricle has hyperdynamic function. The left ventricle has no  regional wall motion abnormalities. Left ventricular diastolic parameters  are indeterminate. There is the  interventricular septum is flattened in systole and diastole, consistent  with right  ventricular pressure and volume overload.   2. Right ventricular systolic function is severely reduced. The right  ventricular size is severely enlarged. There is moderately elevated  pulmonary artery systolic pressure. The estimated right ventricular  systolic pressure is 57.3 mmHg.   3. Right atrial size was severely dilated.   4. A small pericardial effusion is present. The pericardial effusion is  posterior to the left ventricle.   5. The mitral valve is grossly normal. Mild mitral valve regurgitation.   6. Tricuspid valve regurgitation is moderate to severe.   7. The aortic valve was not well visualized. Aortic valve regurgitation  is not visualized.   8. Aortic dilatation noted. There is moderate dilatation of the ascending  aorta, measuring 44 mm.   9. The inferior vena cava is dilated in size with <50% respiratory  variability, suggesting right atrial pressure of 15  mmHg.   Right heart catheterization 09/08/2021: Findings:   RA = 12 RV = 66/13 PA = 70/26 (42) PCW = 8 Fick cardiac output/index = 5.2/2.1 Thermo CO/CI = would not register PVR = 6.6 Ao sat = 97% PA sat = 63%, 63% PAPi = 3.7   Assessment: 1. Moderate PAH with normal PCWP   Plan/Discussion:   Volume status well controlled/low. Can decrease metolazone to M/W/F. Consider selective pulmonary artery vasodilators.    Very bradycardic in cath lab. Will place Zio monitor.   Cardiac monitor January 2023: Patch Wear Time:  7 days and 4 hours (2022-12-15T12:47:14-0500 to 2022-12-22T17:13:42-0500)   1. Continuous AF (100% burden), ranging from 33-98 bpm (avg of 55 bpm). 2. Bundle Branch Block/IVCD was present 3. Rare PVCs . 4. Ventricular Bigeminy and Trigeminy were present.  5. No significant pauses  Assessment and Plan:    Medication Adjustments/Labs and Tests Ordered: Current medicines are reviewed at length with the patient today.  Concerns regarding medicines are outlined above.   Tests  Ordered: No orders of the defined types were placed in this encounter.   Medication Changes: No orders of the defined types were placed in this encounter.   Disposition:  Follow up {follow up:15908}  Signed, Satira Sark, MD, [facility] 10/11/2021 9:12 AM    [facility] [place], [place], [place] [address] Phone: [phone]; Fax: [phone]

## 2021-10-18 ENCOUNTER — Other Ambulatory Visit: Payer: Self-pay

## 2021-10-18 ENCOUNTER — Encounter (INDEPENDENT_AMBULATORY_CARE_PROVIDER_SITE_OTHER): Payer: Medicare HMO | Admitting: Ophthalmology

## 2021-10-18 DIAGNOSIS — E113392 Type 2 diabetes mellitus with moderate nonproliferative diabetic retinopathy without macular edema, left eye: Secondary | ICD-10-CM

## 2021-10-18 DIAGNOSIS — E113311 Type 2 diabetes mellitus with moderate nonproliferative diabetic retinopathy with macular edema, right eye: Secondary | ICD-10-CM

## 2021-10-18 DIAGNOSIS — I10 Essential (primary) hypertension: Secondary | ICD-10-CM

## 2021-10-18 DIAGNOSIS — H35033 Hypertensive retinopathy, bilateral: Secondary | ICD-10-CM | POA: Diagnosis not present

## 2021-10-18 DIAGNOSIS — H43813 Vitreous degeneration, bilateral: Secondary | ICD-10-CM

## 2021-10-18 DIAGNOSIS — H33302 Unspecified retinal break, left eye: Secondary | ICD-10-CM

## 2021-10-25 ENCOUNTER — Ambulatory Visit (INDEPENDENT_AMBULATORY_CARE_PROVIDER_SITE_OTHER): Payer: Medicare HMO | Admitting: *Deleted

## 2021-10-25 DIAGNOSIS — I824Y2 Acute embolism and thrombosis of unspecified deep veins of left proximal lower extremity: Secondary | ICD-10-CM

## 2021-10-25 DIAGNOSIS — I4891 Unspecified atrial fibrillation: Secondary | ICD-10-CM

## 2021-10-25 DIAGNOSIS — Z5181 Encounter for therapeutic drug level monitoring: Secondary | ICD-10-CM

## 2021-10-25 LAB — POCT INR: INR: 2.4 (ref 2.0–3.0)

## 2021-10-25 NOTE — Patient Instructions (Signed)
Continue warfarin 2 tablets (99m) daily except 3 tablets (7.535m on Tuesdays, Thursdays and Saturdays Recheck INR in 6 weeks

## 2021-10-28 ENCOUNTER — Other Ambulatory Visit: Payer: Self-pay | Admitting: Cardiology

## 2021-10-28 DIAGNOSIS — I4891 Unspecified atrial fibrillation: Secondary | ICD-10-CM

## 2021-11-16 ENCOUNTER — Encounter (INDEPENDENT_AMBULATORY_CARE_PROVIDER_SITE_OTHER): Payer: Medicare HMO | Admitting: Ophthalmology

## 2021-11-24 ENCOUNTER — Inpatient Hospital Stay (HOSPITAL_COMMUNITY)
Admission: EM | Admit: 2021-11-24 | Discharge: 2021-12-24 | DRG: 682 | Disposition: E | Payer: Medicare HMO | Attending: Internal Medicine | Admitting: Internal Medicine

## 2021-11-24 ENCOUNTER — Encounter (INDEPENDENT_AMBULATORY_CARE_PROVIDER_SITE_OTHER): Payer: Medicare HMO | Admitting: Ophthalmology

## 2021-11-24 ENCOUNTER — Other Ambulatory Visit: Payer: Self-pay

## 2021-11-24 ENCOUNTER — Emergency Department (HOSPITAL_COMMUNITY): Payer: Medicare HMO

## 2021-11-24 DIAGNOSIS — I482 Chronic atrial fibrillation, unspecified: Secondary | ICD-10-CM | POA: Diagnosis present

## 2021-11-24 DIAGNOSIS — T45515A Adverse effect of anticoagulants, initial encounter: Secondary | ICD-10-CM | POA: Diagnosis present

## 2021-11-24 DIAGNOSIS — Z7901 Long term (current) use of anticoagulants: Secondary | ICD-10-CM

## 2021-11-24 DIAGNOSIS — N179 Acute kidney failure, unspecified: Secondary | ICD-10-CM | POA: Diagnosis not present

## 2021-11-24 DIAGNOSIS — R531 Weakness: Secondary | ICD-10-CM

## 2021-11-24 DIAGNOSIS — Z515 Encounter for palliative care: Secondary | ICD-10-CM

## 2021-11-24 DIAGNOSIS — I081 Rheumatic disorders of both mitral and tricuspid valves: Secondary | ICD-10-CM | POA: Diagnosis present

## 2021-11-24 DIAGNOSIS — J9612 Chronic respiratory failure with hypercapnia: Secondary | ICD-10-CM | POA: Diagnosis present

## 2021-11-24 DIAGNOSIS — Z887 Allergy status to serum and vaccine status: Secondary | ICD-10-CM

## 2021-11-24 DIAGNOSIS — D631 Anemia in chronic kidney disease: Secondary | ICD-10-CM | POA: Diagnosis present

## 2021-11-24 DIAGNOSIS — Z9981 Dependence on supplemental oxygen: Secondary | ICD-10-CM

## 2021-11-24 DIAGNOSIS — R627 Adult failure to thrive: Secondary | ICD-10-CM | POA: Diagnosis present

## 2021-11-24 DIAGNOSIS — Z86711 Personal history of pulmonary embolism: Secondary | ICD-10-CM

## 2021-11-24 DIAGNOSIS — J449 Chronic obstructive pulmonary disease, unspecified: Secondary | ICD-10-CM | POA: Diagnosis present

## 2021-11-24 DIAGNOSIS — I130 Hypertensive heart and chronic kidney disease with heart failure and stage 1 through stage 4 chronic kidney disease, or unspecified chronic kidney disease: Secondary | ICD-10-CM | POA: Diagnosis present

## 2021-11-24 DIAGNOSIS — I50813 Acute on chronic right heart failure: Secondary | ICD-10-CM | POA: Diagnosis present

## 2021-11-24 DIAGNOSIS — I5084 End stage heart failure: Secondary | ICD-10-CM | POA: Diagnosis present

## 2021-11-24 DIAGNOSIS — Z833 Family history of diabetes mellitus: Secondary | ICD-10-CM

## 2021-11-24 DIAGNOSIS — I878 Other specified disorders of veins: Secondary | ICD-10-CM | POA: Diagnosis present

## 2021-11-24 DIAGNOSIS — R778 Other specified abnormalities of plasma proteins: Secondary | ICD-10-CM | POA: Diagnosis present

## 2021-11-24 DIAGNOSIS — K761 Chronic passive congestion of liver: Secondary | ICD-10-CM | POA: Diagnosis present

## 2021-11-24 DIAGNOSIS — R570 Cardiogenic shock: Secondary | ICD-10-CM | POA: Diagnosis present

## 2021-11-24 DIAGNOSIS — R7989 Other specified abnormal findings of blood chemistry: Secondary | ICD-10-CM | POA: Diagnosis present

## 2021-11-24 DIAGNOSIS — I5040 Unspecified combined systolic (congestive) and diastolic (congestive) heart failure: Secondary | ICD-10-CM | POA: Diagnosis present

## 2021-11-24 DIAGNOSIS — R262 Difficulty in walking, not elsewhere classified: Secondary | ICD-10-CM | POA: Diagnosis present

## 2021-11-24 DIAGNOSIS — E871 Hypo-osmolality and hyponatremia: Secondary | ICD-10-CM | POA: Diagnosis present

## 2021-11-24 DIAGNOSIS — N1832 Chronic kidney disease, stage 3b: Secondary | ICD-10-CM | POA: Diagnosis present

## 2021-11-24 DIAGNOSIS — I5082 Biventricular heart failure: Secondary | ICD-10-CM | POA: Diagnosis present

## 2021-11-24 DIAGNOSIS — I2724 Chronic thromboembolic pulmonary hypertension: Secondary | ICD-10-CM | POA: Diagnosis present

## 2021-11-24 DIAGNOSIS — I480 Paroxysmal atrial fibrillation: Secondary | ICD-10-CM | POA: Diagnosis present

## 2021-11-24 DIAGNOSIS — E872 Acidosis, unspecified: Secondary | ICD-10-CM | POA: Diagnosis present

## 2021-11-24 DIAGNOSIS — R197 Diarrhea, unspecified: Secondary | ICD-10-CM | POA: Diagnosis present

## 2021-11-24 DIAGNOSIS — K7200 Acute and subacute hepatic failure without coma: Secondary | ICD-10-CM | POA: Diagnosis not present

## 2021-11-24 DIAGNOSIS — R54 Age-related physical debility: Secondary | ICD-10-CM | POA: Diagnosis present

## 2021-11-24 DIAGNOSIS — J9621 Acute and chronic respiratory failure with hypoxia: Secondary | ICD-10-CM | POA: Diagnosis not present

## 2021-11-24 DIAGNOSIS — Z66 Do not resuscitate: Secondary | ICD-10-CM | POA: Diagnosis not present

## 2021-11-24 DIAGNOSIS — Z20822 Contact with and (suspected) exposure to covid-19: Secondary | ICD-10-CM | POA: Diagnosis present

## 2021-11-24 DIAGNOSIS — D688 Other specified coagulation defects: Secondary | ICD-10-CM | POA: Diagnosis present

## 2021-11-24 DIAGNOSIS — E662 Morbid (severe) obesity with alveolar hypoventilation: Secondary | ICD-10-CM | POA: Diagnosis present

## 2021-11-24 DIAGNOSIS — R6881 Early satiety: Secondary | ICD-10-CM | POA: Diagnosis present

## 2021-11-24 DIAGNOSIS — E1142 Type 2 diabetes mellitus with diabetic polyneuropathy: Secondary | ICD-10-CM | POA: Diagnosis present

## 2021-11-24 DIAGNOSIS — R109 Unspecified abdominal pain: Secondary | ICD-10-CM

## 2021-11-24 DIAGNOSIS — I2781 Cor pulmonale (chronic): Secondary | ICD-10-CM | POA: Diagnosis present

## 2021-11-24 DIAGNOSIS — Z6841 Body Mass Index (BMI) 40.0 and over, adult: Secondary | ICD-10-CM

## 2021-11-24 DIAGNOSIS — Z86718 Personal history of other venous thrombosis and embolism: Secondary | ICD-10-CM

## 2021-11-24 DIAGNOSIS — Z79899 Other long term (current) drug therapy: Secondary | ICD-10-CM

## 2021-11-24 DIAGNOSIS — Z95828 Presence of other vascular implants and grafts: Secondary | ICD-10-CM

## 2021-11-24 DIAGNOSIS — R112 Nausea with vomiting, unspecified: Secondary | ICD-10-CM | POA: Diagnosis present

## 2021-11-24 DIAGNOSIS — E1122 Type 2 diabetes mellitus with diabetic chronic kidney disease: Secondary | ICD-10-CM | POA: Diagnosis present

## 2021-11-24 DIAGNOSIS — Z888 Allergy status to other drugs, medicaments and biological substances status: Secondary | ICD-10-CM

## 2021-11-24 DIAGNOSIS — E1165 Type 2 diabetes mellitus with hyperglycemia: Secondary | ICD-10-CM | POA: Diagnosis not present

## 2021-11-24 DIAGNOSIS — I2721 Secondary pulmonary arterial hypertension: Secondary | ICD-10-CM | POA: Diagnosis present

## 2021-11-24 LAB — CBC
HCT: 44.8 % (ref 39.0–52.0)
Hemoglobin: 15.2 g/dL (ref 13.0–17.0)
MCH: 31.5 pg (ref 26.0–34.0)
MCHC: 33.9 g/dL (ref 30.0–36.0)
MCV: 92.8 fL (ref 80.0–100.0)
Platelets: 115 10*3/uL — ABNORMAL LOW (ref 150–400)
RBC: 4.83 MIL/uL (ref 4.22–5.81)
RDW: 18.8 % — ABNORMAL HIGH (ref 11.5–15.5)
WBC: 6.9 10*3/uL (ref 4.0–10.5)
nRBC: 1 % — ABNORMAL HIGH (ref 0.0–0.2)

## 2021-11-24 LAB — LIPASE, BLOOD: Lipase: 28 U/L (ref 11–51)

## 2021-11-24 LAB — BASIC METABOLIC PANEL
Anion gap: 18 — ABNORMAL HIGH (ref 5–15)
BUN: 86 mg/dL — ABNORMAL HIGH (ref 8–23)
CO2: 23 mmol/L (ref 22–32)
Calcium: 9 mg/dL (ref 8.9–10.3)
Chloride: 88 mmol/L — ABNORMAL LOW (ref 98–111)
Creatinine, Ser: 3.1 mg/dL — ABNORMAL HIGH (ref 0.61–1.24)
GFR, Estimated: 22 mL/min — ABNORMAL LOW (ref >60)
Glucose, Bld: 99 mg/dL (ref 70–99)
Potassium: 3.5 mmol/L (ref 3.5–5.1)
Sodium: 129 mmol/L — ABNORMAL LOW (ref 135–145)

## 2021-11-24 LAB — HEPATIC FUNCTION PANEL
ALT: 73 U/L — ABNORMAL HIGH (ref 0–44)
AST: 109 U/L — ABNORMAL HIGH (ref 15–41)
Albumin: 3.9 g/dL (ref 3.5–5.0)
Alkaline Phosphatase: 119 U/L (ref 38–126)
Bilirubin, Direct: 2.2 mg/dL — ABNORMAL HIGH (ref 0.0–0.2)
Indirect Bilirubin: 1.8 mg/dL — ABNORMAL HIGH (ref 0.3–0.9)
Total Bilirubin: 4 mg/dL — ABNORMAL HIGH (ref 0.3–1.2)
Total Protein: 7.3 g/dL (ref 6.5–8.1)

## 2021-11-24 LAB — PROTIME-INR
INR: 5.3 (ref 0.8–1.2)
Prothrombin Time: 48.3 seconds — ABNORMAL HIGH (ref 11.4–15.2)

## 2021-11-24 LAB — MAGNESIUM: Magnesium: 1.2 mg/dL — ABNORMAL LOW (ref 1.7–2.4)

## 2021-11-24 LAB — AMMONIA: Ammonia: 47 umol/L — ABNORMAL HIGH (ref 9–35)

## 2021-11-24 MED ORDER — SODIUM CHLORIDE 0.9 % IV BOLUS
500.0000 mL | Freq: Once | INTRAVENOUS | Status: AC
  Administered 2021-11-24: 500 mL via INTRAVENOUS

## 2021-11-24 MED ORDER — MAGNESIUM SULFATE 2 GM/50ML IV SOLN
2.0000 g | Freq: Once | INTRAVENOUS | Status: AC
Start: 2021-11-24 — End: 2021-11-25
  Administered 2021-11-24: 2 g via INTRAVENOUS
  Filled 2021-11-24: qty 50

## 2021-11-24 MED ORDER — SODIUM CHLORIDE 0.9 % IV BOLUS: 1000.0000 mL | Freq: Once | INTRAVENOUS | Status: DC

## 2021-11-24 NOTE — ED Notes (Signed)
Pt brought to room from [facility], pt was covered in diarrhea. Pt cleaned up, changed into gown, and placed on monitor. ?

## 2021-11-24 NOTE — ED Triage Notes (Signed)
Pt with generalized weakness x 1 week. Started with pain to tailbone and has progressed to more immobility. Wears 5L home O2.  ?

## 2021-11-24 NOTE — ED Provider Notes (Signed)
[facility] EMERGENCY DEPARTMENT  Provider Note  CSN: [id number] Arrival date & time: 12/08/2021 1247  History Chief Complaint  Patient presents with   Weakness    Roberto [name] is a 61 y.o. male who presents today with weakness.  He states that he is having difficulty ambulating at home.  Normally he is able to walk with a walker.  However recently has been able to get himself out of bed.  He has fallen and unable to get himself up, requiring EMS to come get him off the floor.  He states his legs routinely give out on him.  He also reports having watery diarrhea and vomiting.   Home Medications Prior to Admission medications   Medication Sig Start Date End Date Taking? Authorizing Provider  Accu-Chek Softclix Lancets lancets USE TO TEST TWO TIMES DAILY 07/10/20  Yes Alcus Dad, MD  acetaminophen (TYLENOL) 650 MG CR tablet Take 1,300 mg by mouth in the morning and at bedtime.   Yes [provider]  Alcohol Swabs (B-D SINGLE USE SWABS REGULAR) PADS USE TWO TIMES DAILY 07/10/20  Yes Alcus Dad, MD  Blood Glucose Monitoring Suppl (ACCU-CHEK AVIVA PLUS) w/Device KIT TEST TWO TIMES DAILY 08/06/18  Yes Sherene Sires, DO  Colchicine 0.6 MG CAPS Take 0.6 mg by mouth in the morning and at bedtime. 05/26/21  Yes [provider]  diclofenac Sodium (VOLTAREN) 1 % GEL Apply 4 g topically 4 (four) times daily as needed (joint pain). 02/06/21  Yes Barton Dubois, MD  Dulaglutide (TRULICITY) 8.31 DV/7.6HY SOPN Inject 0.75 mg into the skin once a week. Patient taking differently: Inject 0.75 mg into the skin every Friday. 02/28/21  Yes Darrelyn Hillock N, DO  ferrous sulfate 325 (65 FE) MG tablet Take 325 mg by mouth in the morning.   Yes [provider]  gabapentin (NEURONTIN) 100 MG capsule TAKE 2 CAPSULES (200MG) TWICE DAILY Patient taking differently: 200 mg 2 (two) times daily. 05/27/21  Yes Alcus Dad, MD  metolazone (ZAROXOLYN) 2.5 MG tablet Take  2.5 mg by mouth daily as needed (blood pressure 125 and over). 03/14/21  Yes [provider]  OXYGEN Inhale 5 L into the lungs continuous.   Yes [provider]  oxymetazoline (AFRIN) 0.05 % nasal spray Place 1-2 sprays into both nostrils 2 (two) times daily as needed for congestion.   Yes [provider]  Phenylephrine HCl (SINEX REGULAR NA) Place 2 sprays into both nostrils daily as needed (nasal congestion).   Yes [provider]  potassium chloride SA (KLOR-CON M) 20 MEQ tablet Take 3 tablets (60 mEq total) by mouth daily. 08/30/21  Yes Bensimhon, Shaune Pascal, MD  PRESCRIPTION MEDICATION Inhale into the lungs as needed (whenever sleeping - naps or at night). CPAP   Yes [provider]  torsemide (DEMADEX) 20 MG tablet Take 3 tablets (60 mg total) by mouth daily. Patient taking differently: Take 60 mg by mouth every evening. 08/16/21  Yes Satira Sark, MD  warfarin (COUMADIN) 2.5 MG tablet TAKE 2 TABLETS DAILY EXCEPT 3 TABLETS ON TUESDAYS, THURSDAYS AND SATURDAYS OR AS DIRECTED Patient taking differently: Take 5-7.5 mg by mouth See admin instructions. 7.5 mg Tuesday,Thursday,Saturday 5 mg Monday,Wednesday, Friday,sunday 10/28/21  Yes Satira Sark, MD  ACCU-CHEK AVIVA PLUS test strip TEST BLOOD SUGAR TWICE DAILY 02/01/21   Alcus Dad, MD  cholecalciferol (VITAMIN D) 1000 units tablet Take 1 tablet (1,000 Units total) by mouth daily. Patient not taking: Reported on 11/29/2021  03/08/18   Carlyle Dolly, MD     Allergies    Bacitracin-neomycin-polymyxin and Pneumococcal 13-val conj vacc   Review of Systems   Review of Systems  Constitutional:  Negative for chills and fever.  HENT:  Negative for ear pain and sore throat.   Eyes:  Negative for pain and visual disturbance.  Respiratory:  Negative for cough and shortness of breath.   Cardiovascular:  Negative for chest pain and palpitations.  Gastrointestinal:  Positive for diarrhea,  nausea and vomiting. Negative for abdominal pain.  Genitourinary:  Negative for dysuria and hematuria.  Musculoskeletal:  Negative for arthralgias and back pain.  Skin:  Negative for color change and rash.  Neurological:  Positive for weakness. Negative for seizures and syncope.  All other systems reviewed and are negative. Please see HPI for pertinent positives and negatives  Physical Exam BP 107/79    Pulse (!) 102    Temp 98 F (36.7 C) (Oral)    Resp (!) 21    Wt 133.8 kg    SpO2 93%    BMI 43.56 kg/m   Physical Exam Vitals and nursing note reviewed.  Constitutional:      General: He is not in acute distress.    Appearance: He is well-developed. He is obese. He is ill-appearing.  HENT:     Head: Normocephalic and atraumatic.  Eyes:     Conjunctiva/sclera: Conjunctivae normal.  Cardiovascular:     Rate and Rhythm: Regular rhythm. Tachycardia present.     Heart sounds: No murmur heard. Pulmonary:     Effort: Pulmonary effort is normal. No respiratory distress.     Breath sounds: Normal breath sounds.  Abdominal:     Palpations: Abdomen is soft.     Tenderness: There is no abdominal tenderness.  Musculoskeletal:        General: No swelling.     Cervical back: Neck supple.  Skin:    General: Skin is warm and dry.     Capillary Refill: Capillary refill takes less than 2 seconds.  Neurological:     Mental Status: He is alert.  Psychiatric:        Mood and Affect: Mood normal.    ED Results / Procedures / Treatments   EKG EKG Interpretation  Date/Time:  Thursday November 24 2021 13:23:04 EST Ventricular Rate:  108 PR Interval:    QRS Duration: 132 QT Interval:  370 QTC Calculation: 495 R Axis:   94 Text Interpretation: Atrial fibrillation with rapid ventricular response Right bundle branch block T wave abnormality, consider inferolateral ischemia Abnormal ECG When compared with ECG of 29-Aug-2021 14:52, PREVIOUS ECG IS PRESENT Confirmed by Octaviano Glow [phone]) on  12/09/2021 9:06:33 PM  Procedures Procedures  Medications Ordered in the ED Medications  gabapentin (NEURONTIN) capsule 200 mg (200 mg Oral Given 11/25/21 1100)  insulin aspart (novoLOG) injection 0-9 Units (0 Units Subcutaneous Not Given 11/25/21 1254)  milrinone (PRIMACOR) 20 MG/100 ML (0.2 mg/mL) infusion (0.25 mcg/kg/min  133.8 kg Intravenous New Bag/Given 11/25/21 1435)  furosemide (LASIX) 200 mg in dextrose 5 % 100 mL (2 mg/mL) infusion (12 mg/hr Intravenous New Bag/Given 11/25/21 1438)  sodium chloride 0.9 % bolus 500 mL (0 mLs Intravenous Stopped 11/25/21 0049)  magnesium sulfate IVPB 2 g 50 mL (0 g Intravenous Stopped 11/25/21 0049)  magnesium sulfate IVPB 2 g 50 mL (0 g Intravenous Stopped 11/25/21 0934)  furosemide (LASIX) injection 80 mg (80 mg Intravenous Given 11/25/21 1410)     ED  Course   Clinical Course as of 11/25/21 1708  Thu Nov 24, 2021  2115 This is a 61 year old male with a history of congestive heart failure, fatty liver, presenting from home with generalized weakness.  Patient reports for the past 7 days he had a difficulty getting up from his bed.  He lives by himself and has poor ambulatory function due to obesity at baseline.  He wears 5 L nasal cannula at baseline for congestive heart failure.  He denies any worsening orthopnea.  He reports that he feels that he is dehydrated has not been drinking much water and that "my urine is really dark".  On exam the patient has no acute respiratory distress.  He does not have any focal abdominal tenderness.  Labs are notable for elevated BUN and creatinine suggestive of dehydration with anion gap of 18.  He will be given a small fluid bolus.  We are also pending additional labs, anticipate admission for dehydration.  Lower suspicion for ACS or pulmonary embolism at this time.  His EKG shows chronic atrial fibrillation, for which she is already anticoagulated with Coumadin.  We are awaiting the INR level [MT]    Clinical Course User  Index [MT] Trifan, Carola Rhine, MD     MDM   This patient presents to the ED for concern of weakness, this involves an extensive number of treatment options, and is a complaint that carries with it a high risk of complications and morbidity.  The differential diagnosis includes metabolic abnormality, infection, deconditioning.   Additional history obtained: Records reviewed previous admission documents, Care Everywhere/External Records, and Primary Care Documents  Lab Tests: I Ordered, and personally interpreted labs.  The pertinent results include: AKI with elevated creatinine and BUN.  GFR has decreased to previous.  Mild elevated anion gap.  Mild elevated lactate.  Bilirubin was elevated, liver function test were both elevated.  Imaging Studies ordered: I ordered imaging studies including X-ray chest   I independently visualized and interpreted imaging which showed vascular congestion.  No acute findings. I agree with the radiologist interpretation  EKG (personally reviewed and interpreted): EKG is reassuring.  No new STEMI, ischemia, arrhythmias.   Medical Decision Making: Patient presented with generalized weakness.  He has had a functional decline at home.  He is unable to get himself out of bed or ambulate in his house.  His legs have been giving out on him routinely.  Etiology is unclear at this time.  He does have worsening kidney function, signs of a possible liver injury with elevated bilirubin and transaminases.  Patient has had dark urine recently, question if this is related to dehydration.  Given the patient's elevated liver function test, signs of AKI, and decreased functional bili at home we will plan for admission as I do not feel patient is safe to discharge home at this time.  He is on his baseline oxygen at this time.  He was given small IV fluid bolus, as well as IV magnesium here in the emergency department.  Complexity of problems addressed: Patients presentation is  most consistent with  acute presentation with potential threat to life or bodily function  Disposition: After consideration of the diagnostic results and the patients response to treatment,  I feel that the patent would benefit from admission to the hospital .   Patient seen in conjunction with my attending, Dr. Langston Masker.    Final Clinical Impression(s) / ED Diagnoses Final diagnoses:  Weakness  Hyperbilirubinemia  AKI (acute kidney  injury) [facility])    Rx / [place] Orders ED Discharge Orders     None         Jacelyn Pi, MD 11/25/21 1708    Wyvonnia Dusky, MD 11/26/21 [phone]

## 2021-11-24 NOTE — ED Provider Triage Note (Signed)
Emergency Medicine Provider Triage Evaluation Note ? ?Roberto [name] , a 61 y.o. male  was evaluated in triage.  Pt complains of weakness to his legs.  Normally able to walk some distances, but now having trouble.  Goes to PT.  Denies leg pain.  Hx DVT/VTEs, diabetes mellitus, CHF, and chronic respiratory failure.  On Warfarin.  Pt also has 2 wounds on face he's been picking.  ? ?Review of Systems  ?Positive: Weakness ?Negative: Fever, loss of bowel/urinary function ? ?Physical Exam  ?BP 107/74 (BP Location: Left Arm)   Pulse (!) 107   Temp 99.2 ?F (37.3 ?C) (Oral)   Resp (!) 26   SpO2 93%  ?Gen:   Awake, no distress  ?Resp:  Normal effort, on 5 L O2 via [place] ?MSK:   Moves extremities without difficulty ?Other:  Sensation in tact RLE and LLE; open wounds between brow bones and near right eye; mild cardiac lift observed ? ?Medical Decision Making  ?Medically screening exam initiated at 1:59 PM.  Appropriate orders placed.  Roberto [name] was informed that the remainder of the evaluation will be completed by another provider, this initial triage assessment does not replace that evaluation, and the importance of remaining in the ED until their evaluation is complete. ? ?Labs and EKG ordered ?  ?Roberto Rome, PA-C ?03/40/35 1413 ? ?

## 2021-11-25 ENCOUNTER — Inpatient Hospital Stay (HOSPITAL_COMMUNITY): Payer: Medicare HMO

## 2021-11-25 ENCOUNTER — Encounter (HOSPITAL_COMMUNITY): Payer: Self-pay | Admitting: Internal Medicine

## 2021-11-25 ENCOUNTER — Inpatient Hospital Stay: Payer: Self-pay

## 2021-11-25 ENCOUNTER — Observation Stay (HOSPITAL_COMMUNITY): Payer: Medicare HMO

## 2021-11-25 DIAGNOSIS — R570 Cardiogenic shock: Secondary | ICD-10-CM | POA: Diagnosis present

## 2021-11-25 DIAGNOSIS — R112 Nausea with vomiting, unspecified: Secondary | ICD-10-CM | POA: Diagnosis not present

## 2021-11-25 DIAGNOSIS — Z66 Do not resuscitate: Secondary | ICD-10-CM | POA: Diagnosis not present

## 2021-11-25 DIAGNOSIS — E871 Hypo-osmolality and hyponatremia: Secondary | ICD-10-CM | POA: Diagnosis present

## 2021-11-25 DIAGNOSIS — E1165 Type 2 diabetes mellitus with hyperglycemia: Secondary | ICD-10-CM | POA: Diagnosis not present

## 2021-11-25 DIAGNOSIS — K7200 Acute and subacute hepatic failure without coma: Secondary | ICD-10-CM | POA: Diagnosis not present

## 2021-11-25 DIAGNOSIS — Z7189 Other specified counseling: Secondary | ICD-10-CM

## 2021-11-25 DIAGNOSIS — R197 Diarrhea, unspecified: Secondary | ICD-10-CM | POA: Diagnosis present

## 2021-11-25 DIAGNOSIS — I272 Pulmonary hypertension, unspecified: Secondary | ICD-10-CM | POA: Diagnosis not present

## 2021-11-25 DIAGNOSIS — I482 Chronic atrial fibrillation, unspecified: Secondary | ICD-10-CM | POA: Diagnosis present

## 2021-11-25 DIAGNOSIS — I50810 Right heart failure, unspecified: Secondary | ICD-10-CM

## 2021-11-25 DIAGNOSIS — N179 Acute kidney failure, unspecified: Secondary | ICD-10-CM | POA: Diagnosis present

## 2021-11-25 DIAGNOSIS — I130 Hypertensive heart and chronic kidney disease with heart failure and stage 1 through stage 4 chronic kidney disease, or unspecified chronic kidney disease: Secondary | ICD-10-CM | POA: Diagnosis present

## 2021-11-25 DIAGNOSIS — Z515 Encounter for palliative care: Secondary | ICD-10-CM | POA: Diagnosis not present

## 2021-11-25 DIAGNOSIS — E1142 Type 2 diabetes mellitus with diabetic polyneuropathy: Secondary | ICD-10-CM | POA: Diagnosis present

## 2021-11-25 DIAGNOSIS — I5032 Chronic diastolic (congestive) heart failure: Secondary | ICD-10-CM

## 2021-11-25 DIAGNOSIS — R7989 Other specified abnormal findings of blood chemistry: Secondary | ICD-10-CM | POA: Diagnosis not present

## 2021-11-25 DIAGNOSIS — D631 Anemia in chronic kidney disease: Secondary | ICD-10-CM | POA: Diagnosis present

## 2021-11-25 DIAGNOSIS — R778 Other specified abnormalities of plasma proteins: Secondary | ICD-10-CM | POA: Diagnosis present

## 2021-11-25 DIAGNOSIS — Z20822 Contact with and (suspected) exposure to covid-19: Secondary | ICD-10-CM | POA: Diagnosis present

## 2021-11-25 DIAGNOSIS — E662 Morbid (severe) obesity with alveolar hypoventilation: Secondary | ICD-10-CM | POA: Diagnosis present

## 2021-11-25 DIAGNOSIS — I081 Rheumatic disorders of both mitral and tricuspid valves: Secondary | ICD-10-CM | POA: Diagnosis present

## 2021-11-25 DIAGNOSIS — J449 Chronic obstructive pulmonary disease, unspecified: Secondary | ICD-10-CM | POA: Diagnosis present

## 2021-11-25 DIAGNOSIS — Z6841 Body Mass Index (BMI) 40.0 and over, adult: Secondary | ICD-10-CM | POA: Diagnosis not present

## 2021-11-25 DIAGNOSIS — J9612 Chronic respiratory failure with hypercapnia: Secondary | ICD-10-CM | POA: Diagnosis present

## 2021-11-25 DIAGNOSIS — E872 Acidosis, unspecified: Secondary | ICD-10-CM | POA: Diagnosis present

## 2021-11-25 DIAGNOSIS — R531 Weakness: Secondary | ICD-10-CM | POA: Diagnosis not present

## 2021-11-25 DIAGNOSIS — K761 Chronic passive congestion of liver: Secondary | ICD-10-CM | POA: Diagnosis present

## 2021-11-25 DIAGNOSIS — I5041 Acute combined systolic (congestive) and diastolic (congestive) heart failure: Secondary | ICD-10-CM

## 2021-11-25 DIAGNOSIS — I2721 Secondary pulmonary arterial hypertension: Secondary | ICD-10-CM | POA: Diagnosis present

## 2021-11-25 DIAGNOSIS — J9621 Acute and chronic respiratory failure with hypoxia: Secondary | ICD-10-CM | POA: Diagnosis not present

## 2021-11-25 DIAGNOSIS — N1832 Chronic kidney disease, stage 3b: Secondary | ICD-10-CM | POA: Diagnosis present

## 2021-11-25 DIAGNOSIS — D688 Other specified coagulation defects: Secondary | ICD-10-CM | POA: Diagnosis present

## 2021-11-25 LAB — CBC WITH DIFFERENTIAL/PLATELET
Abs Immature Granulocytes: 0.06 10*3/uL (ref 0.00–0.07)
Basophils Absolute: 0.1 10*3/uL (ref 0.0–0.1)
Basophils Relative: 1 %
Eosinophils Absolute: 0.1 10*3/uL (ref 0.0–0.5)
Eosinophils Relative: 1 %
HCT: 45.3 % (ref 39.0–52.0)
Hemoglobin: 15.2 g/dL (ref 13.0–17.0)
Immature Granulocytes: 1 %
Lymphocytes Relative: 6 %
Lymphs Abs: 0.4 10*3/uL — ABNORMAL LOW (ref 0.7–4.0)
MCH: 31.5 pg (ref 26.0–34.0)
MCHC: 33.6 g/dL (ref 30.0–36.0)
MCV: 93.8 fL (ref 80.0–100.0)
Monocytes Absolute: 1.1 10*3/uL — ABNORMAL HIGH (ref 0.1–1.0)
Monocytes Relative: 17 %
Neutro Abs: 5 10*3/uL (ref 1.7–7.7)
Neutrophils Relative %: 74 %
Platelets: 100 10*3/uL — ABNORMAL LOW (ref 150–400)
RBC: 4.83 MIL/uL (ref 4.22–5.81)
RDW: 19.5 % — ABNORMAL HIGH (ref 11.5–15.5)
WBC: 6.7 10*3/uL (ref 4.0–10.5)
nRBC: 0.9 % — ABNORMAL HIGH (ref 0.0–0.2)

## 2021-11-25 LAB — LACTIC ACID, PLASMA
Lactic Acid, Venous: 2.1 mmol/L (ref 0.5–1.9)
Lactic Acid, Venous: 2.4 mmol/L (ref 0.5–1.9)

## 2021-11-25 LAB — HEPATIC FUNCTION PANEL
ALT: 67 U/L — ABNORMAL HIGH (ref 0–44)
AST: 95 U/L — ABNORMAL HIGH (ref 15–41)
Albumin: 3.5 g/dL (ref 3.5–5.0)
Alkaline Phosphatase: 105 U/L (ref 38–126)
Bilirubin, Direct: 2.3 mg/dL — ABNORMAL HIGH (ref 0.0–0.2)
Indirect Bilirubin: 2.2 mg/dL — ABNORMAL HIGH (ref 0.3–0.9)
Total Bilirubin: 4.5 mg/dL — ABNORMAL HIGH (ref 0.3–1.2)
Total Protein: 6.5 g/dL (ref 6.5–8.1)

## 2021-11-25 LAB — BRAIN NATRIURETIC PEPTIDE: B Natriuretic Peptide: 413.8 pg/mL — ABNORMAL HIGH (ref 0.0–100.0)

## 2021-11-25 LAB — BASIC METABOLIC PANEL
Anion gap: 19 — ABNORMAL HIGH (ref 5–15)
BUN: 93 mg/dL — ABNORMAL HIGH (ref 8–23)
CO2: 19 mmol/L — ABNORMAL LOW (ref 22–32)
Calcium: 8.6 mg/dL — ABNORMAL LOW (ref 8.9–10.3)
Chloride: 89 mmol/L — ABNORMAL LOW (ref 98–111)
Creatinine, Ser: 3.23 mg/dL — ABNORMAL HIGH (ref 0.61–1.24)
GFR, Estimated: 21 mL/min — ABNORMAL LOW (ref >60)
Glucose, Bld: 106 mg/dL — ABNORMAL HIGH (ref 70–99)
Potassium: 3.9 mmol/L (ref 3.5–5.1)
Sodium: 127 mmol/L — ABNORMAL LOW (ref 135–145)

## 2021-11-25 LAB — ECHOCARDIOGRAM COMPLETE
Area-P 1/2: 4.6 cm2
MV M vel: 4.65 m/s
MV Peak grad: 86.5 mmHg
Radius: 0.9 cm
S' Lateral: 1.9 cm
Single Plane A4C EF: 74.2 %
Weight: 4720 oz

## 2021-11-25 LAB — CBG MONITORING, ED
Glucose-Capillary: 103 mg/dL — ABNORMAL HIGH (ref 70–99)
Glucose-Capillary: 107 mg/dL — ABNORMAL HIGH (ref 70–99)
Glucose-Capillary: 136 mg/dL — ABNORMAL HIGH (ref 70–99)

## 2021-11-25 LAB — HEPATITIS PANEL, ACUTE
HCV Ab: NONREACTIVE
Hep A IgM: NONREACTIVE
Hep B C IgM: NONREACTIVE
Hepatitis B Surface Ag: NONREACTIVE

## 2021-11-25 LAB — RESP PANEL BY RT-PCR (FLU A&B, COVID) ARPGX2
Influenza A by PCR: NEGATIVE
Influenza B by PCR: NEGATIVE
SARS Coronavirus 2 by RT PCR: NEGATIVE

## 2021-11-25 LAB — HIV ANTIBODY (ROUTINE TESTING W REFLEX): HIV Screen 4th Generation wRfx: NONREACTIVE

## 2021-11-25 LAB — TROPONIN I (HIGH SENSITIVITY)
Troponin I (High Sensitivity): 111 ng/L (ref <18)
Troponin I (High Sensitivity): 103 ng/L (ref <18)

## 2021-11-25 LAB — HEMOGLOBIN A1C
Hgb A1c MFr Bld: 5.5 % (ref 4.8–5.6)
Mean Plasma Glucose: 111.15 mg/dL

## 2021-11-25 LAB — MAGNESIUM: Magnesium: 1.3 mg/dL — ABNORMAL LOW (ref 1.7–2.4)

## 2021-11-25 MED ORDER — SODIUM CHLORIDE 0.9 % IV SOLN
INTRAVENOUS | Status: DC
Start: 2021-11-25 — End: 2021-11-25

## 2021-11-25 MED ORDER — FUROSEMIDE 10 MG/ML IJ SOLN
12.0000 mg/h | INTRAVENOUS | Status: DC
  Administered 2021-11-25 – 2021-11-26 (×2): 12 mg/h via INTRAVENOUS
  Filled 2021-11-25 (×2): qty 20

## 2021-11-25 MED ORDER — MAGNESIUM SULFATE 2 GM/50ML IV SOLN
2.0000 g | Freq: Once | INTRAVENOUS | Status: AC
  Administered 2021-11-25: 2 g via INTRAVENOUS
  Filled 2021-11-25: qty 50

## 2021-11-25 MED ORDER — COLCHICINE 0.6 MG PO TABS
0.6000 mg | ORAL_TABLET | Freq: Two times a day (BID) | ORAL | Status: DC
  Filled 2021-11-25: qty 1

## 2021-11-25 MED ORDER — FUROSEMIDE 10 MG/ML IJ SOLN
80.0000 mg | Freq: Once | INTRAMUSCULAR | Status: AC
  Administered 2021-11-25: 80 mg via INTRAVENOUS
  Filled 2021-11-25: qty 8

## 2021-11-25 MED ORDER — MILRINONE LACTATE IN DEXTROSE 20-5 MG/100ML-% IV SOLN
0.2500 ug/kg/min | INTRAVENOUS | Status: DC
  Administered 2021-11-25 – 2021-11-26 (×2): 0.25 ug/kg/min via INTRAVENOUS
  Filled 2021-11-25 (×2): qty 100

## 2021-11-25 MED ORDER — INSULIN ASPART 100 UNIT/ML IJ SOLN
0.0000 [IU] | Freq: Three times a day (TID) | INTRAMUSCULAR | Status: DC
  Administered 2021-11-26: 2 [IU] via SUBCUTANEOUS
  Administered 2021-11-26: 1 [IU] via SUBCUTANEOUS
  Administered 2021-11-27 (×2): 2 [IU] via SUBCUTANEOUS

## 2021-11-25 MED ORDER — GABAPENTIN 100 MG PO CAPS
200.0000 mg | ORAL_CAPSULE | Freq: Two times a day (BID) | ORAL | Status: DC
  Administered 2021-11-25 – 2021-11-27 (×5): 200 mg via ORAL
  Filled 2021-11-25 (×5): qty 2

## 2021-11-25 MED ORDER — MAGNESIUM SULFATE 4 GM/100ML IV SOLN
4.0000 g | Freq: Once | INTRAVENOUS | Status: DC
  Filled 2021-11-25: qty 100

## 2021-11-25 NOTE — Progress Notes (Signed)
ED RN stated that pt is waiting for transporter to go to unit. ?

## 2021-11-25 NOTE — ED Notes (Signed)
Room n ot clean 4e rn to call when the bed is  ready ?

## 2021-11-25 NOTE — Progress Notes (Signed)
61 year old gentleman with history of severe pulmonary artery hypertension, obesity hypoventilation syndrome and sleep apnea on CPAP at night, chronic hypoxemic respiratory failure on 5 L oxygen, CKD stage IIIb with baseline creatinine 1.5-2, paroxysmal A-fib, history of pulmonary embolism, type 2 diabetes on Trulicity presented to the ER with progressive weakness for about a week, diarrhea, low back pain and extreme debility.  In the emergency room blood pressure stable.  Mildly elevated LFTs with a bilirubin of 4, creatinine 3.1.  COVID-negative.  Chest x-ray with vascular congestion.  INR 5.3. ? ?Patient seen and examined.  Admitted early morning hours by nighttime hospitalist.  As above patient presents with severe pulmonary artery hypertension, extreme debility and exacerbation of right-sided heart failure. ?Lumbar spine MRI was without any acute findings. ?He was complaining of diarrhea and dry tongue, was given 500 mL of normal saline. ?Patient will need advanced heart failure therapy, may need vasopressor support and treatment.  Consulted CHF team. ?Will need goal of care discussion, will consult palliative care. ?Mobilize with PT/OT when able. ?Has watery diarrhea, less likely C. difficile.  GI pathogen panel was sent. ?Replace magnesium, receiving total 4 g today.  Recheck levels tomorrow morning. ? ?Same-day admission.  No charge visit. ?

## 2021-11-25 NOTE — ED Notes (Signed)
Called RT about pt's Sa02 and to assess other 02 needs ?

## 2021-11-25 NOTE — H&P (Signed)
History and Physical    Kline Bulthuis LOV:[id number] DOB: [birth year] DOA: 12/06/2021  PCP: Wilburt Finlay, MD  Patient coming from: Home.  Chief Complaint: Weakness.  HPI: Roberto [name] is a 61 y.o. male with history of pulmonary artery hypertension, obesity hypoventilation, chronic respiratory failure on 5 L oxygen, chronic disease stage III baseline creatinine around 1.5, atrial fibrillation history of PE, diabetes mellitus has been feeling increasingly weak over the last 1 week.  Has been experiencing some low back pain.  Patient also has knee pain did get cortisone shots on both his knee about 4 days ago.  Patient states about 4 days ago he was finding it difficult to get up from the commode and had to crawl on the floor to call EMS.  His weakness has progressed now.  Denies any tingling or numbness or any incontinence of urine.  Has had some low back pain.  Denies fever chills.  Yesterday morning patient drank some Gatorade following which she had multiple episodes of watery diarrhea and vomiting.  Denies any abdominal pain fever or chills.  In the ER labs show elevated LFTs at AST of 109 ALT of 73 total bilirubin of 4.  Lactic acid is 2.1.  Creatinine has worsened to 3.1 baseline is around 1.5 in December 2022.  Patient was given fluid bolus in the ER has received 500 cc normal saline.  COVID test is negative.  UA is pending.  Chest x-ray shows vascular congestion.  INR 5.3.  Patient admitted for generalized weakness with acute on chronic kidney disease with elevated LFTs.  ED Course: In the ER  Review of Systems: As per HPI, rest all negative.   Past Medical History:  Diagnosis Date   Asthma    Atrial fibrillation (HCC)    CKD (chronic kidney disease) stage 3, GFR 30-59 ml/min (HCC)    COPD (chronic obstructive pulmonary disease) ([place])    COVID-19    Essential hypertension    History of DVT (deep vein thrombosis)    Lung nodule    Nosebleed    Reportedly recurrent on Eliquis  and Xarelto   OSA on CPAP    Pulmonary hypertension (HCC)    Respiratory failure with hypoxia (HCC)    Type 2 diabetes mellitus ([place])     Past Surgical History:  Procedure Laterality Date   RIGHT HEART CATH Right 2022   RIGHT HEART CATH N/A 09/08/2021   Procedure: RIGHT HEART CATH;  Surgeon: Jolaine Artist, MD;  Location: [facility];  Service: Cardiovascular;  Laterality: N/A;   SHOULDER SURGERY       reports that he has never smoked. He has never used smokeless tobacco. He reports that he does not drink alcohol and does not use drugs.  Allergies  Allergen Reactions   Bacitracin-Neomycin-Polymyxin Itching and Rash   Pneumococcal 13-Val Conj Vacc Other (See Comments)    "Locked my shoulder up" "Locked my shoulder up"    Family History  Problem Relation Age of Onset   Diabetes Mother    Cancer Mother    Diabetes Father    High blood pressure Father    Diabetes Brother    Diabetes Brother     Prior to Admission medications   Medication Sig Start Date End Date Taking? Authorizing Provider  Accu-Chek Softclix Lancets lancets USE TO TEST TWO TIMES DAILY 07/10/20  Yes Alcus Dad, MD  acetaminophen (TYLENOL) 650 MG CR tablet Take 1,300 mg by mouth in the morning and at bedtime.  Yes [provider]  Alcohol Swabs (B-D SINGLE USE SWABS REGULAR) PADS USE TWO TIMES DAILY 07/10/20  Yes Alcus Dad, MD  Blood Glucose Monitoring Suppl (ACCU-CHEK AVIVA PLUS) w/Device KIT TEST TWO TIMES DAILY 08/06/18  Yes Sherene Sires, DO  Colchicine 0.6 MG CAPS Take 0.6 mg by mouth in the morning and at bedtime. 05/26/21  Yes [provider]  diclofenac Sodium (VOLTAREN) 1 % GEL Apply 4 g topically 4 (four) times daily as needed (joint pain). 02/06/21  Yes Barton Dubois, MD  Dulaglutide (TRULICITY) 2.63 ZC/5.8IF SOPN Inject 0.75 mg into the skin once a week. Patient taking differently: Inject 0.75 mg into the skin every Friday. 02/28/21  Yes Darrelyn Hillock N, DO   ferrous sulfate 325 (65 FE) MG tablet Take 325 mg by mouth in the morning.   Yes [provider]  gabapentin (NEURONTIN) 100 MG capsule TAKE 2 CAPSULES (200MG) TWICE DAILY Patient taking differently: 200 mg 2 (two) times daily. 05/27/21  Yes Alcus Dad, MD  metolazone (ZAROXOLYN) 2.5 MG tablet Take 2.5 mg by mouth daily as needed (blood pressure 125 and over). 03/14/21  Yes [provider]  OXYGEN Inhale 5 L into the lungs continuous.   Yes [provider]  oxymetazoline (AFRIN) 0.05 % nasal spray Place 1-2 sprays into both nostrils 2 (two) times daily as needed for congestion.   Yes [provider]  Phenylephrine HCl (SINEX REGULAR NA) Place 2 sprays into both nostrils daily as needed (nasal congestion).   Yes [provider]  potassium chloride SA (KLOR-CON M) 20 MEQ tablet Take 3 tablets (60 mEq total) by mouth daily. 08/30/21  Yes Bensimhon, Shaune Pascal, MD  PRESCRIPTION MEDICATION Inhale into the lungs as needed (whenever sleeping - naps or at night). CPAP   Yes [provider]  torsemide (DEMADEX) 20 MG tablet Take 3 tablets (60 mg total) by mouth daily. Patient taking differently: Take 60 mg by mouth every evening. 08/16/21  Yes Satira Sark, MD  warfarin (COUMADIN) 2.5 MG tablet TAKE 2 TABLETS DAILY EXCEPT 3 TABLETS ON TUESDAYS, THURSDAYS AND SATURDAYS OR AS DIRECTED Patient taking differently: Take 5-7.5 mg by mouth See admin instructions. 7.5 mg Tuesday,Thursday,Saturday 5 mg Monday,Wednesday, Friday,sunday 10/28/21  Yes Satira Sark, MD  ACCU-CHEK AVIVA PLUS test strip TEST BLOOD SUGAR TWICE DAILY 02/01/21   Alcus Dad, MD  cholecalciferol (VITAMIN D) 1000 units tablet Take 1 tablet (1,000 Units total) by mouth daily. Patient not taking: Reported on 12/14/2021 03/08/18   Carlyle Dolly, MD    Physical Exam: Constitutional: Moderately built and nourished. Vitals:   12/07/2021 2230 11/26/2021 2245 11/30/2021 2315 11/25/21  0200  BP: (!) 142/82 117/82 123/82   Pulse: 78 71 77 81  Resp: 13 17 (!) 24 (!) 21  Temp:      TempSrc:      SpO2: 100% 99% 100% 96%   Eyes: Anicteric no pallor. ENMT: No discharge from the ears eyes nose and mouth. Neck: No mass felt.  No neck rigidity no JVD appreciated. Respiratory: No rhonchi or crepitations. Cardiovascular: S1-S2 heard. Abdomen: Soft nontender bowel sound present. Musculoskeletal: Bilateral lower extremity edema present. Skin: Chronic skin changes of the both lower extremities extending up to the mid calf. Neurologic: Alert awake oriented time place and person.  Moves all extremities. Psychiatric: Appears normal.  Normal affect.   Labs on Admission: I have personally reviewed following labs and imaging studies  CBC: Recent Labs  Lab 12/02/2021 1347  WBC  6.9  HGB 15.2  HCT 44.8  MCV 92.8  PLT 631*   Basic Metabolic Panel: Recent Labs  Lab 12/02/2021 1347 12/16/2021 2152  NA 129*  --   K 3.5  --   CL 88*  --   CO2 23  --   GLUCOSE 99  --   BUN 86*  --   CREATININE 3.10*  --   CALCIUM 9.0  --   MG  --  1.2*   GFR: CrCl cannot be calculated (Unknown ideal weight.). Liver Function Tests: Recent Labs  Lab 12/03/2021 2152  AST 109*  ALT 73*  ALKPHOS 119  BILITOT 4.0*  PROT 7.3  ALBUMIN 3.9   Recent Labs  Lab 11/23/2021 2152  LIPASE 28   Recent Labs  Lab 12/06/2021 2154  AMMONIA 47*   Coagulation Profile: Recent Labs  Lab 12/06/2021 2152  INR 5.3*   Cardiac Enzymes: No results for input(s): CKTOTAL, CKMB, CKMBINDEX, TROPONINI in the last 168 hours. BNP (last 3 results) No results for input(s): PROBNP in the last 8760 hours. HbA1C: No results for input(s): HGBA1C in the last 72 hours. CBG: No results for input(s): GLUCAP in the last 168 hours. Lipid Profile: No results for input(s): CHOL, HDL, LDLCALC, TRIG, CHOLHDL, LDLDIRECT in the last 72 hours. Thyroid Function Tests: No results for input(s): TSH, T4TOTAL, FREET4, T3FREE,  THYROIDAB in the last 72 hours. Anemia Panel: No results for input(s): VITAMINB12, FOLATE, FERRITIN, TIBC, IRON, RETICCTPCT in the last 72 hours. Urine analysis: No results found for: COLORURINE, APPEARANCEUR, [place], [place], GLUCOSEU, HGBUR, BILIRUBINUR, KETONESUR, PROTEINUR, UROBILINOGEN, NITRITE, LEUKOCYTESUR Sepsis Labs: _0 (procalcitonin:4,lacticidven:4) ) Recent Results (from the past 240 hour(s))  Resp Panel by RT-PCR (Flu A&B, Covid)     Status: None   Collection Time: 12/05/2021 11:50 PM   Specimen: Nasopharyngeal(NP) swabs in vial transport medium  Result Value Ref Range Status   SARS Coronavirus 2 by RT PCR NEGATIVE NEGATIVE Final    Comment: (NOTE) SARS-CoV-2 target nucleic acids are NOT DETECTED.  The SARS-CoV-2 RNA is generally detectable in upper respiratory specimens during the acute phase of infection. The lowest concentration of SARS-CoV-2 viral copies this assay can detect is 138 copies/mL. A negative result does not preclude SARS-Cov-2 infection and should not be used as the sole basis for treatment or other patient management decisions. A negative result may occur with  improper specimen collection/handling, submission of specimen other than nasopharyngeal swab, presence of viral mutation(s) within the areas targeted by this assay, and inadequate number of viral copies(<138 copies/mL). A negative result must be combined with clinical observations, patient history, and epidemiological information. The expected result is Negative.  Fact Sheet for Patients:  EntrepreneurPulse.com.au  Fact Sheet for Healthcare Providers:  IncredibleEmployment.be  This test is no t yet approved or cleared by the [place] FDA and  has been authorized for detection and/or diagnosis of SARS-CoV-2 by FDA under an Emergency Use Authorization (EUA). This EUA will remain  in effect (meaning this test can be used) for the duration of  the COVID-19 declaration under Section 564(b)(1) of the Act, 21 U.S.C.section 360bbb-3(b)(1), unless the authorization is terminated  or revoked sooner.       Influenza A by PCR NEGATIVE NEGATIVE Final   Influenza B by PCR NEGATIVE NEGATIVE Final    Comment: (NOTE) The Xpert Xpress SARS-CoV-2/FLU/RSV plus assay is intended as an aid in the diagnosis of influenza from Nasopharyngeal swab specimens and should not be used as a sole basis for treatment.  Nasal washings and aspirates are unacceptable for Xpert Xpress SARS-CoV-2/FLU/RSV testing.  Fact Sheet for Patients: EntrepreneurPulse.com.au  Fact Sheet for Healthcare Providers: IncredibleEmployment.be  This test is not yet approved or cleared by the [place] FDA and has been authorized for detection and/or diagnosis of SARS-CoV-2 by FDA under an Emergency Use Authorization (EUA). This EUA will remain in effect (meaning this test can be used) for the duration of the COVID-19 declaration under Section 564(b)(1) of the Act, 21 U.S.C. section 360bbb-3(b)(1), unless the authorization is terminated or revoked.  Performed at [facility], [place] [address] [place], [place] [address]      Radiological Exams on Admission: DG Chest Portable 1 View  Result Date: 11/25/2021 CLINICAL DATA:  Generalized weakness for 1 week, short of breath EXAM: PORTABLE CHEST 1 VIEW COMPARISON:  09/22/2021 FINDINGS: 2 frontal views of the chest demonstrate marked enlargement the cardiac silhouette. Chronic central vascular prominence without airspace disease, effusion, or pneumothorax. Left costophrenic angle is excluded by collimation. No acute bony abnormalities. IMPRESSION: 1. Enlarged cardiac silhouette. 2. Chronic central vascular congestion without airspace disease. Electronically Signed   By: Randa Ngo M.D.   On: 12/09/2021 21:28    EKG: Independently reviewed.  A-fib rate around 106  bpm.  Assessment/Plan Principal Problem:   Nausea vomiting and diarrhea Active Problems:   Combined congestive systolic and diastolic heart failure (HCC)   Type 2 diabetes mellitus with diabetic polyneuropathy, without long-term current use of insulin (HCC)   Morbid obesity with body mass index of 50.0-59.9 in adult [facility])   Warfarin anticoagulation   Weakness   Elevated LFTs   Nausea & vomiting    Nausea vomiting and diarrhea could be from gastroenteritis.  Check GI pathogen panel.  Patient states he has not taken any antibiotics recently. Acute on chronic kidney disease stage III creatinine has worsened from baseline of around 1.5 in December 2022 and it is around 3.1 now.  Likely could have been worsened from the recent diarrhea.  We will hold diuretics for for now.  Closely monitor intake output metabolic panel. Elevated LFTs cause not clear.  Check acute hepatitis panel and right upper quadrant sonogram.  Follow LFTs.  Could also be from CHF. Lower extremity weakness could be from deconditioning.  However since patient is complaining of low back pain we will get MRI of the L-spine.  CT head is pending.  Physical therapy consult. Paroxysmal atrial fibrillation was in rapid ventricular rate initially but improved.  We will continue to monitor.  On Coumadin for anticoagulation.  INR is supratherapeutic will be monitored by pharmacy. Chronic respiratory failure with patient being on 5 L oxygen secondary to pulmonary artery hypertension usually takes torsemide and metolazone.  Presently on hold due to worsening renal function.  Will need cardiology input.  Patient had followed with [facility] and patient states they have sent a medication for him which has still not arrived. Obesity hypoventilation on BiPAP at bedtime. Hypomagnesemia likely from diarrhea replace and recheck. Diabetes mellitus type 2 we will keep patient on sliding scale coverage.  Since patient has worsening renal  function with generalized weakness diarrhea multiple comorbidities will need inpatient status.    DVT prophylaxis: Coumadin. Code Status: Full code. Family Communication: Discussed with patient. Disposition Plan: To be determined. Consults called: Likely will need cardiology input.  If creatinine worsening will need nephrology input. Admission status: Inpatient.   Rise Patience MD [facility] Pager [phone].  If 7PM-7AM, please contact  night-coverage [email or web address] Password [email or web address]  11/25/2021, 2:52 AM

## 2021-11-25 NOTE — ED Notes (Signed)
Patient transported to MRI ?

## 2021-11-25 NOTE — Progress Notes (Signed)
ANTICOAGULATION CONSULT NOTE - Initial Consult ? ?Pharmacy Consult for warfarin ?Indication: atrial fibrillation and DVT ? ?Allergies  ?Allergen Reactions  ? Bacitracin-Neomycin-Polymyxin Itching and Rash  ? Pneumococcal 13-Val Conj Vacc Other (See Comments)  ?  "Locked my shoulder up" ?"Locked my shoulder up"  ? ? ?Patient Measurements: ?  ? ?Vital Signs: ?Temp: 98.5 ?F (36.9 ?C) (03/02 1724) ?BP: 123/82 (03/02 2315) ?Pulse Rate: 81 (03/03 0200) ? ?Labs: ?Recent Labs  ?  12/16/2021 ?1347 12/07/2021 ?2152 12/02/2021 ?2314 11/25/21 ?0151  ?HGB 15.2  --   --   --   ?HCT 44.8  --   --   --   ?PLT 115*  --   --   --   ?LABPROT  --  48.3*  --   --   ?INR  --  5.3*  --   --   ?CREATININE 3.10*  --   --   --   ?TROPONINIHS  --   --  111* 103*  ? ? ?CrCl cannot be calculated (Unknown ideal weight.). ? ? ?Medical History: ?Past Medical History:  ?Diagnosis Date  ? Asthma   ? Atrial fibrillation ([place])   ? CKD (chronic kidney disease) stage 3, GFR 30-59 ml/min (HCC)   ? COPD (chronic obstructive pulmonary disease) ([place])   ? COVID-19   ? Essential hypertension   ? History of DVT (deep vein thrombosis)   ? Lung nodule   ? Nosebleed   ? Reportedly recurrent on Eliquis and Xarelto  ? OSA on CPAP   ? Pulmonary hypertension ([place])   ? Respiratory failure with hypoxia ([place])   ? Type 2 diabetes mellitus ([place])   ? ? ?Assessment: ?61 year old male with history of DVT and afib on chronic warfarin. INR is elevated at 5.3. CBC within normal limits. No bleeding issues noted.  ? ?Prior to admit warfarin dose is 53m daily except 7.555mon Tuesday, Thursday, and Saturdays.  ? ?Goal of Therapy:  ?INR 2-3 ?Monitor platelets by anticoagulation protocol: Yes ?  ?Plan:  ?Hold warfarin today ?Recheck INR daily ? ?FrErin HearingharmD., BCPS ?Clinical Pharmacist ?11/25/2021 2:59 AM ? ?

## 2021-11-25 NOTE — Progress Notes (Signed)
Pt. Refused cpap. Pt. States he cannot tolerate the mask. Pt. States he wears the nasal prongs at home. Pt. Decided to keep his cannula on. RN aware. ?

## 2021-11-25 NOTE — Progress Notes (Signed)
Consent obtained for PICC placement. ED RN said that  pt will be transferred to [place] shortly. ?

## 2021-11-25 NOTE — Progress Notes (Signed)
?  Echocardiogram ?2D Echocardiogram has been performed. ? ?Roberto [name] ?11/25/2021, 3:38 PM ?

## 2021-11-25 NOTE — ED Notes (Signed)
The bed is ready on 4e ?

## 2021-11-25 NOTE — Progress Notes (Signed)
PT Cancellation Note ? ?Patient Details ?Name: Roberto [name] ?MRN: [id number] ?DOB: [birth year] ? ? ?Cancelled Treatment:    Reason Eval/Treat Not Completed: Patient at procedure or test/unavailable.  Gone to MRI, will retry at another time. ? ? ?Ramond Dial ?11/25/2021, 9:37 AM ? ?Mee Hives, PT PhD ?Acute Rehab Dept. Number: [facility] [phone] and [place] [phone] ? ?

## 2021-11-25 NOTE — ED Notes (Addendum)
Placed pt on bariatric hosp bed w/air mattress ?

## 2021-11-25 NOTE — Hospital Course (Signed)
61 year old gentleman with history of severe pulmonary artery hypertension, obesity hypoventilation syndrome and sleep apnea on CPAP at night, chronic hypoxemic respiratory failure on 5 L oxygen, CKD stage IIIb with baseline creatinine 1.5-2, paroxysmal A-fib, history of pulmonary embolism, type 2 diabetes on Trulicity presented to the ER with progressive weakness for about a week, diarrhea, low back pain and extreme debility.  In the emergency room blood pressure stable.  Mildly elevated LFTs with a bilirubin of 4, creatinine 3.1.  COVID-negative.  Chest x-ray with vascular congestion.  INR 5.3. ?

## 2021-11-25 NOTE — ED Notes (Signed)
I had secretary page admitting dr regarding low bp. Waiting on call back. ?

## 2021-11-25 NOTE — Progress Notes (Signed)
Followed up with ED RN on pt's transfer but RN unavailable. ?

## 2021-11-25 NOTE — Consult Note (Addendum)
Advanced Heart Failure Team Consult Note   Primary Physician: Wilburt Finlay, MD PCP-Cardiologist:  Rozann Lesches, MD AHF: Dr. Haroldine Laws   Reason for Consultation: Acute on Chronic Right Sided Heart Failure, Pulmonary HTN  HPI:    Roberto [name] is seen today for evaluation of acute on chronic right sided heart failure and pulmonary HTN at the request of Dr. Sloan Leiter, Internal Medicine.   He has h/o morbid obesity, DM2, HTN, OSA on bipap, chronic AF, COPD, DVT/PE, CKD 3b and PAH/cor pulmonale.    Previously followed at [facility]. Has h/o DVTs  VQ scan on 06/22/20 showed wedge-shaped perfusion defects in the right upper, left upper and bilateral lower lobes, which are concerning for possible PE.Same day SPECT CT showed multiple perfusion defects in the bilateral lungs as described above, concerning for PE   RHC in 6/22 at [facility]   RA 22 PA 95/45 (60) PCWP 22 Fick 5.1/1.9 Pulmonary angio by Dr. Denman George - no significant filling defects (limited images)   Admitted to [facility] in 8/22 for RHF in setting of known severe pulmonary hypertension and cor pulmonale. He was started on Adempas, but stopped after 1 week the medication related to substantial weight gain despite continued use of torsemide. Remains on Coumadin given previous intolerances to Eliquis and Xarelto secondary to recurrent nosebleeds.     He saw Dr. Domenic Polite 07/06/21 and referred to our [facility], while also awaiting visit at [facility] 10/12/21.   Had first visit w/ Dr. Haroldine Laws 11/22. Echo was done and showed LVEF 70% with septal flattening RV markedly dilated and HK RVSP 69mHG. Moderate to severe TR. Volume status was ok. He was continued on torsemide and set up for RHC.   [place] showed Moderate PAH with normal PCWP:  RA = 12 RV = 66/13 PA = 70/26 (42) PCW = 8 Fick cardiac output/index = 5.2/2.1 Thermo CO/CI = would not register PVR = 6.6 Ao sat = 97% PA sat = 63%, 63% PAPi = 3.7  [facility]. He was started sildenafil 20 mg TID w/ plans to also consider Tyvaso in the future. They also had concern for possible component of WHO group 1 PH given exam findings. Serology testing sent. ANA was positive. Unable to locate other lab results. [facility]   He presented to the ED today w/ complaints of severe weakness, low back pain and inability to walk. He was able to get to the toilet but was unable to get up. Had to crawl to phone to call EMS. Reports full compliance w/ meds, supp home O2 and CPAP w/o change in baseline breathing status. Denies CP. Has had recent diarrhea and vomiting.   ED w/u showed elevated LFTs, AST of 109 ,ALT of 73 total bilirubin of 4.5.  INR 5.3. Lactic acid  2.1. AKI w/ SCr elevated at 3.1 (prior baseline ~1.5). CXR showed vascular congestion. BNP 413. COVID and flu negative. Given 500 cc bolus of NaCl and started on gtt at 75 mL/hr. TRH admitting.   He is markedly fluid overloaded on exam. Complains early satiety and decreased appetite. He claims he has not yet started sildenafil that was ordered by [facility]. He says he has been waiting to receive through mail order pharmacy. AHF team consulted for assistance w/ RV failure and PH.    Cardiac Studies  RHC 08/2021 Findings:   RA = 12 RV = 66/13 PA = 70/26 (  42) PCW = 8 Fick cardiac output/index = 5.2/2.1 Thermo CO/CI = would not register PVR = 6.6 Ao sat = 97% PA sat = 63%, 63% PAPi = 3.7   Assessment: 1. Moderate PAH with normal PCWP  Echo 07/2021 left ventricular ejection fraction, by estimation, is 70 to 75%. The left ventricle has hyperdynamic function. The left ventricle has no regional wall motion abnormalities. Left ventricular diastolic parameters are indeterminate. There is the interventricular septum is flattened in systole and diastole, consistent with right ventricular pressure and volume overload. 1. Right ventricular systolic function is  severely reduced. The right ventricular size is severely enlarged. There is moderately elevated pulmonary artery systolic pressure. The estimated right ventricular systolic pressure is 14.7 mmHg. 2. 3. Right atrial size was severely dilated. A small pericardial effusion is present. The pericardial effusion is posterior to the left ventricle. 4. 5. The mitral valve is grossly normal. Mild mitral valve regurgitation. 6. Tricuspid valve regurgitation is moderate to severe. 7. The aortic valve was not well visualized. Aortic valve regurgitation is not visualized.     Review of Systems: [y] = yes, _0  = no   General: Weight gain _1 ; Weight loss _2 ; Anorexia [Y ]; Fatigue [ Y]; Fever _3 ; Chills _4 ; Weakness [ Y]  Cardiac: Chest pain/pressure _5 ; Resting SOB _6 ; Exertional SOB [ Y]; Orthopnea _7 ; Pedal Edema _8 ; Palpitations _9 ; Syncope _10 ; Presyncope _11 ; Paroxysmal nocturnal dyspnea_12   Pulmonary: Cough _13 ; Wheezing_14 ; Hemoptysis_15 ; Sputum _16 ; Snoring _17   GI: Vomiting[ Y]; Dysphagia_18 ; Melena_19 ; Hematochezia _20 ; Heartburn_21 ; Abdominal pain [Y]; Constipation _22 ; Diarrhea [Y ]; BRBPR _23   GU: Hematuria_24 ; Dysuria _25 ; Nocturia_26   Vascular: Pain in legs with walking [ Y]; Pain in feet with lying flat _27 ; Non-healing sores _28 ; Stroke _29 ; TIA _30 ; Slurred speech _31 ;  Neuro: Headaches_32 ; Vertigo_33 ; Seizures_34 ; Paresthesias_35 ;Blurred vision _36 ; Diplopia _37 ; Vision changes _38   Ortho/Skin: Arthritis _39 ; Joint pain _40 ; Muscle pain _41 ; Joint swelling _42 ; Back Pain [Y ]; Rash _43   Psych: Depression_44 ; Anxiety_45   Heme: Bleeding problems _46 ; Clotting disorders _47 ; Anemia _48   Endocrine: Diabetes _49 ; Thyroid dysfunction_50   Home Medications Prior to Admission medications   Medication Sig Start Date End Date Taking? Authorizing Provider  Accu-Chek Softclix Lancets lancets USE TO TEST TWO TIMES DAILY 07/10/20  Yes Alcus Dad, MD  acetaminophen (TYLENOL) 650  MG CR tablet Take 1,300 mg by mouth in the morning and at bedtime.   Yes [provider]  Alcohol Swabs (B-D SINGLE USE SWABS REGULAR) PADS USE TWO TIMES DAILY 07/10/20  Yes Alcus Dad, MD  Blood Glucose Monitoring Suppl (ACCU-CHEK AVIVA PLUS) w/Device KIT TEST TWO TIMES DAILY 08/06/18  Yes Sherene Sires, DO  Colchicine 0.6 MG CAPS Take 0.6 mg by mouth in the morning and at bedtime. 05/26/21  Yes [provider]  diclofenac Sodium (VOLTAREN) 1 % GEL Apply 4 g topically 4 (four) times daily as needed (joint pain). 02/06/21  Yes Barton Dubois, MD  Dulaglutide (TRULICITY) 8.29 FA/2.1HY SOPN Inject 0.75 mg into the skin once a week. Patient taking differently: Inject 0.75 mg into the skin every Friday. 02/28/21  Yes Darrelyn Hillock N, DO  ferrous sulfate 325 (65 FE) MG tablet Take 325 mg by mouth in  the morning.   Yes [provider]  gabapentin (NEURONTIN) 100 MG capsule TAKE 2 CAPSULES (200MG) TWICE DAILY Patient taking differently: 200 mg 2 (two) times daily. 05/27/21  Yes Alcus Dad, MD  metolazone (ZAROXOLYN) 2.5 MG tablet Take 2.5 mg by mouth daily as needed (blood pressure 125 and over). 03/14/21  Yes [provider]  OXYGEN Inhale 5 L into the lungs continuous.   Yes [provider]  oxymetazoline (AFRIN) 0.05 % nasal spray Place 1-2 sprays into both nostrils 2 (two) times daily as needed for congestion.   Yes [provider]  Phenylephrine HCl (SINEX REGULAR NA) Place 2 sprays into both nostrils daily as needed (nasal congestion).   Yes [provider]  potassium chloride SA (KLOR-CON M) 20 MEQ tablet Take 3 tablets (60 mEq total) by mouth daily. 08/30/21  Yes Bensimhon, Shaune Pascal, MD  PRESCRIPTION MEDICATION Inhale into the lungs as needed (whenever sleeping - naps or at night). CPAP   Yes [provider]  torsemide (DEMADEX) 20 MG tablet Take 3 tablets (60 mg total) by mouth daily. Patient taking differently: Take 60 mg  by mouth every evening. 08/16/21  Yes Satira Sark, MD  warfarin (COUMADIN) 2.5 MG tablet TAKE 2 TABLETS DAILY EXCEPT 3 TABLETS ON TUESDAYS, THURSDAYS AND SATURDAYS OR AS DIRECTED Patient taking differently: Take 5-7.5 mg by mouth See admin instructions. 7.5 mg Tuesday,Thursday,Saturday 5 mg Monday,Wednesday, Friday,sunday 10/28/21  Yes Satira Sark, MD  ACCU-CHEK AVIVA PLUS test strip TEST BLOOD SUGAR TWICE DAILY 02/01/21   Alcus Dad, MD  cholecalciferol (VITAMIN D) 1000 units tablet Take 1 tablet (1,000 Units total) by mouth daily. Patient not taking: Reported on 12/19/2021 03/08/18   Carlyle Dolly, MD    Past Medical History: Past Medical History:  Diagnosis Date   Asthma    Atrial fibrillation ([place])    CKD (chronic kidney disease) stage 3, GFR 30-59 ml/min (HCC)    COPD (chronic obstructive pulmonary disease) ([place])    COVID-19    Essential hypertension    History of DVT (deep vein thrombosis)    Lung nodule    Nosebleed    Reportedly recurrent on Eliquis and Xarelto   OSA on CPAP    Pulmonary hypertension (HCC)    Respiratory failure with hypoxia ([place])    Type 2 diabetes mellitus ([place])     Past Surgical History: Past Surgical History:  Procedure Laterality Date   RIGHT HEART CATH Right 2022   RIGHT HEART CATH N/A 09/08/2021   Procedure: RIGHT HEART CATH;  Surgeon: Jolaine Artist, MD;  Location: [place] [facility];  Service: Cardiovascular;  Laterality: N/A;   SHOULDER SURGERY      Family History: Family History  Problem Relation Age of Onset   Diabetes Mother    Cancer Mother    Diabetes Father    High blood pressure Father    Diabetes Brother    Diabetes Brother     Social History: Social History   Socioeconomic History   Marital status: Single    Spouse name: Not on file   Number of children: 2   Years of education: 12th grade   Highest education level: High school graduate  Occupational History   Occupation: retired/disabled     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    Patient has a few friends there he socializes with.    Patient is divorced with 2 children.   Patient requires 5L of continuous O2.    Social Determinants of Health   Financial Resource Strain: Low Risk    Difficulty of Paying Living Expenses: Not very hard  Food Insecurity: No Food Insecurity   Worried About Charity fundraiser in the Last Year: Never true   Ran Out of Food in the Last Year: Never true  Transportation Needs: No Transportation Needs   Lack of Transportation (Medical): No   Lack of Transportation (Non-Medical): No  Physical Activity: Inactive   Days of Exercise per Week: 0 days   Minutes of Exercise per Session: 0 min  Stress: No Stress Concern Present   Feeling of Stress : Only a little  Social Connections: Moderately Isolated   Frequency of Communication with Friends and Family: More than three times a week   Frequency of Social Gatherings with Friends and Family: More than three times a week   Attends Religious Services: 1 to 4 times per year   Active Member of [employer] or Organizations: No   Attends Archivist Meetings: Never   Marital Status: Divorced    Allergies:  Allergies  Allergen Reactions   Bacitracin-Neomycin-Polymyxin Itching and Rash   Pneumococcal 13-Val Conj Vacc Other (See Comments)    "Locked my shoulder up" "Locked my shoulder up"    Objective:    Vital Signs:   Temp:  [97.7 F (36.5 C)-99.2 F (37.3 C)] 98 F (36.7 C) (03/03 0816) Pulse Rate:  [71-107] 83 (03/03 1045) Resp:  [13-34] 27 (03/03 1045) BP: (100-142)/(69-84) 109/70  (03/03 1045) SpO2:  [91 %-100 %] 91 % (03/03 1045)    Weight change: There were no vitals filed for this visit.  Intake/Output:   Intake/Output Summary (Last 24 hours) at 11/25/2021 1114 Last data filed at 11/25/2021 0934 Gross per 24 hour  Intake 50 ml  Output --  Net 50 ml      Physical Exam    General:  chronically ill appearing, morbidly obese, No resp difficulty, on nasal canula  HEENT: normal Neck: supple. JVP elevated to ear . Carotids 2+ bilat; no bruits. No lymphadenopathy or thyromegaly appreciated. Cor: PMI nondisplaced. Irregularly irregular rhythm. 3/6 TR murmur  Lungs: decreased BS at the bases bilaterally  Abdomen: obese, soft, nontender, nondistended. No hepatosplenomegaly. No bruits or masses. Good bowel sounds. Extremities: no cyanosis, clubbing, rash, 2+ b/l edema, b/l chronic venous stasis dermatitis, distal extremities cool  Neuro: alert & orientedx3, cranial nerves grossly intact. moves all 4 extremities w/o difficulty. Affect pleasant   Telemetry   Atrial fibrillation, low 100s, occasional PVCs   EKG    Atrial fibrillation 108 bpm, personally reviewed  Labs   Basic Metabolic Panel: Recent Labs  Lab 12/14/2021 1347 12/23/2021 2152 11/25/21 0536  NA 129*  --  127*  K 3.5  --  3.9  CL 88*  --  89*  CO2 23  --  19*  GLUCOSE 99  --  106*  BUN 86*  --  93*  CREATININE 3.10*  --  3.23*  CALCIUM 9.0  --  8.6*  MG  --  1.2* 1.3*  Liver Function Tests: Recent Labs  Lab 12/14/2021 2152 11/25/21 0536  AST 109* 95*  ALT 73* 67*  ALKPHOS 119 105  BILITOT 4.0* 4.5*  PROT 7.3 6.5  ALBUMIN 3.9 3.5   Recent Labs  Lab 11/28/2021 2152  LIPASE 28   Recent Labs  Lab 12/02/2021 2154  AMMONIA 47*    CBC: Recent Labs  Lab 12/06/2021 1347 11/25/21 0536  WBC 6.9 6.7  NEUTROABS  --  5.0  HGB 15.2 15.2  HCT 44.8 45.3  MCV 92.8 93.8  PLT 115* 100*    Cardiac Enzymes: No results for input(s): CKTOTAL, CKMB, CKMBINDEX, TROPONINI in the last 168  hours.  BNP: BNP (last 3 results) Recent Labs    02/02/21 1730 08/29/21 1522 11/23/2021 2314  BNP 458.0* 487.1* 413.8*    ProBNP (last 3 results) No results for input(s): PROBNP in the last 8760 hours.   CBG: Recent Labs  Lab 11/25/21 0815  GLUCAP 103*    Coagulation Studies: Recent Labs    12/17/2021 2152  LABPROT 48.3*  INR 5.3*     Imaging   CT HEAD WO CONTRAST (5MM)  Result Date: 11/25/2021 CLINICAL DATA:  Increasing weakness EXAM: CT HEAD WITHOUT CONTRAST TECHNIQUE: Contiguous axial images were obtained from the base of the skull through the vertex without intravenous contrast. RADIATION DOSE REDUCTION: This exam was performed according to the departmental dose-optimization program which includes automated exposure control, adjustment of the mA and/or kV according to patient size and/or use of iterative reconstruction technique. COMPARISON:  None. FINDINGS: Brain: There is no evidence of acute intracranial hemorrhage, extra-axial fluid collection, or acute infarct Background parenchymal volume is normal. The ventricles are normal in size. Gray-white differentiation is preserved. There is no mass lesion. There is no midline shift. Vascular: No hyperdense vessel or unexpected calcification. Skull: Normal. Negative for fracture or focal lesion. Sinuses/Orbits: There is mild mucosal thickening in the paranasal sinuses. The globes and orbits are unremarkable. Other: None. IMPRESSION: No acute intracranial pathology. Electronically Signed   By: Valetta Mole M.D.   On: 11/25/2021 08:14   MR LUMBAR SPINE WO CONTRAST  Result Date: 11/25/2021 CLINICAL DATA:  61 year old male with weakness, myelopathy. EXAM: MRI LUMBAR SPINE WITHOUT CONTRAST TECHNIQUE: Multiplanar, multisequence MR imaging of the lumbar spine was performed. No intravenous contrast was administered. COMPARISON:  None. FINDINGS: Segmentation: Lumbar segmentation appears to be normal and will be designated as such for this  report. Alignment: Preserved lumbar lordosis. No significant spondylolisthesis or scoliosis. Vertebrae: No marrow edema or evidence of acute osseous abnormality. Mild degenerative endplate marrow signal changes primarily at L3. Background bone marrow signal within normal limits. Occasional benign vertebral body hemangiomas. Intact visible sacrum and SI joints. Conus medullaris and cauda equina: Conus extends to the T12-L1 level. No lower spinal cord or conus signal abnormality. Fairly capacious spinal canal. Normal cauda equina nerve roots. Paraspinal and other soft tissues: Polycystic renal changes bilaterally. Otherwise negative visible abdominal viscera. Somewhat featureless appearance of the distal colon in the pelvis. Partially visible distended urinary bladder. Disc levels: T11-T12: Negative. T12-L1:  Negative. L1-L2:  Negative. L2-L3:  Minor disc desiccation and disc bulging.  No stenosis. L3-L4: Disc desiccation and disc space loss. Probable trace vacuum disc. Mild circumferential disc bulge. Mild facet and ligament flavum hypertrophy. Trace degenerative facet joint fluid. But no convincing spinal stenosis or neural impingement at this level. L4-L5: Disc desiccation, mild disc bulge, posterior element hypertrophy. Trace degenerative facet joint fluid. No convincing stenosis. L5-S1: Mild disc  desiccation with a broad-based central disc protrusion best seen on series 8, image 31 and series 5, image 10. Mild to moderate facet hypertrophy, but capacious spinal canal with no spinal or lateral recess stenosis. No foraminal stenosis. IMPRESSION: 1. Mild for age lumbar spine degeneration, L3-L4 through L5-S1. Small disc herniation at the latter but no convincing lumbar spinal stenosis or neural impingement. Normal appearance of the conus and cauda equina nerve roots. 2. Polycystic renal disease. Somewhat featureless appearance of the visible distal colon in the pelvis. Query acute or chronic colitis. Electronically  Signed   By: Genevie Ann M.D.   On: 11/25/2021 10:32   DG Chest Portable 1 View  Result Date: 12/14/2021 CLINICAL DATA:  Generalized weakness for 1 week, short of breath EXAM: PORTABLE CHEST 1 VIEW COMPARISON:  09/22/2021 FINDINGS: 2 frontal views of the chest demonstrate marked enlargement the cardiac silhouette. Chronic central vascular prominence without airspace disease, effusion, or pneumothorax. Left costophrenic angle is excluded by collimation. No acute bony abnormalities. IMPRESSION: 1. Enlarged cardiac silhouette. 2. Chronic central vascular congestion without airspace disease. Electronically Signed   By: Randa Ngo M.D.   On: 12/10/2021 21:28   US Abdomen Limited RUQ (LIVER/GB)  Result Date: 11/25/2021 CLINICAL DATA:  Abdominal pain. EXAM: ULTRASOUND ABDOMEN LIMITED RIGHT UPPER QUADRANT COMPARISON:  07/20/2021. FINDINGS: Gallbladder: Multiple stones are present in the gallbladder. There is no gallbladder wall thickening or pericholecystic fluid. No sonographic Murphy sign noted by sonographer. Common bile duct: Diameter: 5.1 mm.  No intrahepatic biliary ductal dilatation. Liver: No focal lesion identified. The liver has a nodular contour, suggesting underlying cirrhosis. Within normal limits in parenchymal echogenicity. Portal vein is patent on color Doppler imaging with normal direction of blood flow towards the liver. Other: No free fluid. IMPRESSION: 1. Cholelithiasis without acute cholecystitis. 2. Nodular contour of the liver suggesting underlying cirrhosis. Electronically Signed   By: Brett Fairy M.D.   On: 11/25/2021 03:40     Medications:     Current Medications:  gabapentin  200 mg Oral BID   insulin aspart  0-9 Units Subcutaneous TID WC    Infusions:  sodium chloride 75 mL/hr at 11/25/21 1105      Patient Profile   61 y/o male w/ morbid obesity, Cor Pulmonale secondary to [place], h/o DVT ? CTEPH, on coumadin, chronic afib and stage IIIb CKD, admitted w/ acute on chronic RV  failure w/ low output.   Assessment/Plan   Acute on Chronic RV Failure/Cor Pulmonale w/ Low Output  - secondary to severe PH - Echo 08/16/21: LVEF 70% with septal flattening RV markedly dilated and HK RVSP 69mHG. Moderate to severe TR - RHC 12/22 showed moderate PAH with normal PCWP, PA = 70/26 (42), PVR 6.6. FICK CI 2.1.  PAPi = 3.7 - Now w/ a/c failure w/ marked fluid overload w/ shock/ lactic acidosis and evidence of hepatic and renal congestion w/ elevated LFTs and AKI. Lactic acid 2.4. Tbili 4.5  - Stop IVFs  - Diurese w/ IV Lasix, 80 mg bid  - Start Milrinone for RV support and lowering of PA pressures - Place Unna boots  - Start with PICC line placement.  - Will also need to engage palliative care to discuss goals of care. Suspect end-stage RV failure   2. Pulmonary HTN - suspect primarily WHO Group 3 due to OSA/OHS but previous w/u also suggestive of possible CTEPH. [facility] George[facility] obvious filling defects.  - RHC 12/22:  c/w moderate PAH with normal PCWP, PA = 70/26 (42), PVR 6.6 - Previously failed Adempas due to volume overload - Had consult with Dr. Christa See at [facility] in 1/23. Plan was to start sildenafil but hasn't received from mail order pharmacy yet. - [facility] also suspicious for possible WHO Group 1 component given rheumatoid findings on exam. It appears ANA was positive but I don't see where any other testing was done - continue supp O2 and CPAP qhs   3. AKI on Stage IIIb CKD  - baseline SCr 1.5-2.0 - 3.23 on admit  - cardiorenal, in setting of low output HF  - support output w/ milrinone, IV Lasix for renal venous decongestion   4. Elevated LFTs - AST 109 - ALT 73  - Tbil 4.5  - suspect 2/2 hepatic congestion from a/c RV failure. Tx plan per above  - mentation appears clear currently, monitor for encephalopathy   5. Hypervolemic Hyponatremia  - Na 127  - diurese w/ IV Lasix  - Tolvaptan if drop <125   6. Tricuspid  Regurgitation  - mod-severe to TTE 11/22 - functional in setting of severely dilated RV   7. Chronic Afib  - V-rates ~ 100 - holding coumadin w/ elevated INR, cover with heparin gtt when INR trends down.   8. Elevated HS Trop  - low level, 111>>103 - suspect demand ischemia   9. Hypomagnesemia - Mg severely low, 1.3 - Aggressive repletion w/ IV Mg - follow closely   10. GI - nausea/vomiting - suspect 2/2 a/c RV failure, low output  - management per above   11. Coagulopathy  - INR 5.3 - plts 100 K  - in setting of RV failure/ liver injury and critical illness  - H/H stable, monitor for bleeding - hold coumadin   12. Morbid Obesity There is no height or weight on file to calculate BMI.  13. COPD - on chronic home O2  - do not suspect acute exacerbation   14. OSA - CPAP QHS    Length of Stay: 0  Brittainy Simmons, PA-C  11/25/2021, 11:14 AM  Advanced Heart Failure Team Pager [phone] (M-F; 7a - 5p)  Please contact [facility] for night-coverage after hours (4p -7a ) and weekends on [email or web address]  Patient seen with PA, agree with the above note.   61 y.o. with history of PAH and severe RV failure on home oxygen.  PAH is likely multifactorial with suspected CTEPH as well as likely OHS/OSA and possible group 1 component (elevated ANA).  Patient is obese and primarily limited to power chair, minimally active at home.  Reports marked fatigue and difficulty moving around for the last week, also with watery diarrhea.   Noted to have AKI with creatinine 3.23 and lactate 2.4, he was initially given IV fluid in the ER.  BP stable and in NSR.  He is wearing 5L home oxygen when his his home regimen.   General: NAD Neck: JVP 16+, no thyromegaly or thyroid nodule.  Lungs: Clear to auscultation bilaterally with normal respiratory effort. CV: RV heave.  Heart regular S1/S2, no S3/S4, 2/6 HSM LLSB.  1+ chronic edema to knees.  No carotid bruit.  Normal pedal pulses.  Abdomen:  Soft, nontender, no hepatosplenomegaly, no distention.  Skin: Intact without lesions or rashes.  Neurologic: Alert and oriented x 3.  Psych: Normal affect. Extremities: No clubbing or cyanosis.  HEENT: Normal.   Suspect end-stage RV failure with long-standing severe pulmonary arterial hypertension. PH postulated to be  due to OHS/OSA, CTEPH, and possibly group 1 component with elevated ANA. He never started on sildenafil (had been recommended after seeing [facility] CTEPH program). 11/22 echo showed a severely dilated and dysfunctional RV with D-shaped septum and moderate-severe TR.  Preserved LV function.  He is minimally active, gets around in a power chair.  Now with markedly volume overload, elevated lactate suggestive of cardiogenic shock and creatinine up to 3.2 from baseline 2.2-2.3. He is volume overloaded on exam.  - Suspect poor long-term prognosis, will need to involve palliative care service while he is here.  - Place PICC to follow CVP/co-ox => not HD candidate with severe RV failure.   - Will start empiric milrinone 0.25 mcg/kg/min for RV support.  - Needs extensive diuresis, will give Lasix 80 mg IV bolus then 12 mg/hr.  Hopefully diuresis/decreased renal venous pressure and milrinone for improved cardiac output will help renal function.  - Continue CPAP at night.  - I will repeat echo.  - Unna boots.  - If he does not respond to initial therapy, consider RHC with Swan placement for guidance.   Chronic atrial fibrillation.  INR 5 today, hold warfarin and start heparin gtt when INR has trended down.   Loralie Champagne 11/25/2021 2:06 PM

## 2021-11-25 NOTE — Consult Note (Addendum)
Palliative Care Consult Note                                  Date: 11/25/2021   Patient Name: Roberto [name]  DOB: 1960-10-16  MRN: [id number]  Age / Sex: 61 y.o., male  PCP: Roberto Finlay, MD Referring Physician: Barb Merino, MD  Reason for Consultation: Establishing goals of care  HPI/Patient Profile: 61 y.o. male  with past medical history of severe pulmonary artery hypertension, obesity hypoventilation syndrome and sleep apnea on CPAP at night, chronic hypoxemic respiratory failure on 5 L oxygen, CKD stage IIIb with baseline creatinine 1.5-2, paroxysmal A-fib, history of pulmonary embolism, type 2 diabetes on Trulicity.  He presented to the ER progressive weakness, diarrhea, extreme debility.  In the ER noted creatinine of 3.1 (baseline closer to 1.5), elevated LFTs.  He was admitted on 12/12/2021 with AKI on CKD (likely cardiorenal), elevated LFTs (likely hepatic congestion from heart failure), deconditioning, chronic respiratory failure, obesity hypoventilation, nausea/vomiting/diarrhea (likely due to heart failure ?  Gastric/intestinal congestion).  PMT was consulted for goals of care conversation  Past Medical History:  Diagnosis Date   Asthma    Atrial fibrillation ([place])    CKD (chronic kidney disease) stage 3, GFR 30-59 ml/min (HCC)    COPD (chronic obstructive pulmonary disease) ([place])    COVID-19    Essential hypertension    History of DVT (deep vein thrombosis)    Lung nodule    Nosebleed    Reportedly recurrent on Eliquis and Xarelto   OSA on CPAP    Pulmonary hypertension (HCC)    Respiratory failure with hypoxia (HCC)    Type 2 diabetes mellitus (HCC)     Subjective:   This NP Roberto [name] reviewed medical records, received report from team, assessed the patient and then meet at the patient's bedside to discuss diagnosis, prognosis, GOC, EOL wishes disposition and options.  I met with the patient at the bedside.  No  family was present at the bedside.   Concept of Palliative Care was introduced as specialized medical care for people and their families living with serious illness.  If focuses on providing relief from the symptoms and stress of a serious illness.  The goal is to improve quality of life for both the patient and the family. Values and goals of care important to patient and family were attempted to be elicited.  Created space and opportunity for patient  and family to explore thoughts and feelings regarding current medical situation   Natural trajectory and current clinical status were discussed. Questions and concerns addressed. Patient  encouraged to call with questions or concerns.    Patient/Family Understanding of Illness: He understands he has lung disease and that there are likely limited options.  He also knows the doctor told him that heart failure treatment may worsen his kidneys so it is a "balancing act".  We further discussed his issues including acute on chronic RV failure/cor pulmonale with decreased output.  We discussed how progressive heart failure can worsen kidney function.  We discussed that his heart failure is likely at least in part due to pulmonary hypertension which is moderate the severe.  We discussed that these are all progressive diseases and he has had worsening recently.  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  We discussed that there are likely limited, if any, viable treatment options for his multiple chronic conditions with acute exacerbations.  Life Review: He notes 2 daughters.  His 1 daughter Roberto [name] is close to him.  His other daughter he says "seems like she cannot be bothered to help".  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  Patient Values: Life, trying  to live as long as possible  Goals: Full scope of care, any available treatment options to live.  Related to CODE STATUS "even if there is a small chance that it helps, I want it done  Today's Discussion: In addition to life review and discussion about his ongoing chronic and now acute health issues as described above, we further delved into the progressive nature of his disease.  We discussed the interplay of organ systems and how they all are meant to work together and when 1 starts failing it can cause failure and others.  We discussed that his worsening heart failure likely contributed to by his lung disease is responsible for his worsening kidney failure (AKI) and elevated LFTs.  We discussed that it is a balancing act because the medications needed to help remove fluid and improve his heart function even just a little bit run the risk of worsening his kidney disease which can then worsen his fluid retention and heart failure.  He seems to understand that he is very sick.  He does admit, without suggestion, that he likely has limited options.  We discussed that things that bring him joy are "life".  He states that he wants to try to live as long as possible.  He is open and accepting of any and all available medical options.  He states that he hopes he will not have to go on dialysis.  We had a very frank discussion that with all his health issues, even if he needed dialysis it may not be an option and that would be up to the kidney doctors on whether he could tolerate dialysis, because it is hard on your heart as well.  I provided emotional general support through therapeutic listening, empathy, sharing of stories, and other techniques.  Answered all questions and addressed all concerns to the best my ability.  Review of Systems  Constitutional:  Positive for fatigue.  Respiratory:  Positive for shortness of breath.   Gastrointestinal:  Positive for diarrhea, nausea and vomiting.   Objective:    Primary Diagnoses: Present on Admission:  Nausea vomiting and diarrhea  Elevated LFTs  Type 2 diabetes mellitus with diabetic polyneuropathy, without long-term current use of insulin (HCC)  Combined congestive systolic and diastolic heart failure (HCC)  Nausea & vomiting  ARF (acute renal failure) ([place])   Physical Exam Vitals and nursing note reviewed.  Constitutional:      General: He is not in acute distress.    Appearance: He is morbidly obese. He is ill-appearing.  Cardiovascular:     Rate and Rhythm: Tachycardia present.  Pulmonary:     Effort: Pulmonary effort is normal. No respiratory distress.     Comments: On high flow [place] Abdominal:     General: Abdomen is protuberant.     Palpations: Abdomen is soft.  Skin:    General: Skin is warm and dry.  Neurological:     General: No focal deficit present.     Mental Status: He  is alert.  Psychiatric:        Mood and Affect: Mood normal.        Behavior: Behavior normal.    Vital Signs:  BP 120/84    Pulse 99    Temp 98 F (36.7 C) (Oral)    Resp (!) 32    Wt 133.8 kg    SpO2 94%    BMI 43.56 kg/m   Palliative Assessment/Data: 30%    Advanced Care Planning:   Primary Decision Maker: PATIENT  Code Status/Advance Care Planning: Full code  A discussion was had today regarding advanced directives. Concepts specific to code status, artifical feeding and hydration, continued IV antibiotics and rehospitalization was had.  The difference between a aggressive medical intervention path and a palliative comfort care path for this patient at this time was had.   Decisions/Changes to ACP: No decisions/changes today  Assessment & Plan:   Impression: 61 year old male with multiple chronic comorbidities including pulmonary hypertension, CKD, chronic RV failure now with acute exacerbations of heart failure, cor pulmonale, decreased output, AKI on CKD, elevated LFTs, nausea/vomiting.  All of these are likely interplay amongst  each other primarily driven by worsening heart failure.  There is seem to be limited viable treatment options for him.  He does understand that he is very sick.  At this point plan is to admit him on Lasix drip, milrinone to try to attempt some better cardiac output.  He is aware that there is a risk of worsening kidney function with some of these medications.  Overall very poor prognosis.  However, he remains steadfast in wanting full code/full scope despite realistic goals of care conversation.  SUMMARY OF RECOMMENDATIONS   Continue full code/full scope Time for outcomes Continue GOC discussions as clinical care evolves PMT will continue to follow  Symptom Management:  Per primary team PMT is available to assist as needed  Prognosis:  Unable to determine  Discharge Planning:  To Be Determined   Discussed with: Medical team, nursing team, patient    Thank you for allowing [place] to participate in the care of Roberto [name] PMT will continue to support holistically.  Time Total: 90 min  Greater than 50%  of this time was spent counseling and coordinating care related to the above assessment and plan.  Signed by: Roberto Field, NP Palliative Medicine Team  Team Phone # [phone] (Nights/Weekends)  11/25/2021, 3:20 PM

## 2021-11-26 ENCOUNTER — Inpatient Hospital Stay (HOSPITAL_COMMUNITY): Payer: Medicare HMO

## 2021-11-26 DIAGNOSIS — R570 Cardiogenic shock: Secondary | ICD-10-CM | POA: Diagnosis not present

## 2021-11-26 DIAGNOSIS — R197 Diarrhea, unspecified: Secondary | ICD-10-CM

## 2021-11-26 DIAGNOSIS — R112 Nausea with vomiting, unspecified: Secondary | ICD-10-CM

## 2021-11-26 LAB — URINALYSIS, ROUTINE W REFLEX MICROSCOPIC
Bilirubin Urine: NEGATIVE
Glucose, UA: NEGATIVE mg/dL
Hgb urine dipstick: NEGATIVE
Ketones, ur: NEGATIVE mg/dL
Leukocytes,Ua: NEGATIVE
Nitrite: NEGATIVE
Protein, ur: NEGATIVE mg/dL
Specific Gravity, Urine: 1.015 (ref 1.005–1.030)
pH: 5 (ref 5.0–8.0)

## 2021-11-26 LAB — POCT I-STAT 7, (LYTES, BLD GAS, ICA,H+H)
Acid-base deficit: 1 mmol/L (ref 0.0–2.0)
Bicarbonate: 22.7 mmol/L (ref 20.0–28.0)
Calcium, Ion: 1.03 mmol/L — ABNORMAL LOW (ref 1.15–1.40)
HCT: 41 % (ref 39.0–52.0)
Hemoglobin: 13.9 g/dL (ref 13.0–17.0)
O2 Saturation: 89 %
Patient temperature: 98.6
Potassium: 3.9 mmol/L (ref 3.5–5.1)
Sodium: 122 mmol/L — ABNORMAL LOW (ref 135–145)
TCO2: 24 mmol/L (ref 22–32)
pCO2 arterial: 34 mmHg (ref 32–48)
pH, Arterial: 7.433 (ref 7.35–7.45)
pO2, Arterial: 55 mmHg — ABNORMAL LOW (ref 83–108)

## 2021-11-26 LAB — GASTROINTESTINAL PANEL BY PCR, STOOL (REPLACES STOOL CULTURE)

## 2021-11-26 LAB — COMPREHENSIVE METABOLIC PANEL
ALT: 63 U/L — ABNORMAL HIGH (ref 0–44)
AST: 86 U/L — ABNORMAL HIGH (ref 15–41)
Albumin: 3 g/dL — ABNORMAL LOW (ref 3.5–5.0)
Alkaline Phosphatase: 100 U/L (ref 38–126)
Anion gap: 20 — ABNORMAL HIGH (ref 5–15)
BUN: 104 mg/dL — ABNORMAL HIGH (ref 8–23)
CO2: 24 mmol/L (ref 22–32)
Calcium: 8.4 mg/dL — ABNORMAL LOW (ref 8.9–10.3)
Chloride: 86 mmol/L — ABNORMAL LOW (ref 98–111)
Creatinine, Ser: 3.64 mg/dL — ABNORMAL HIGH (ref 0.61–1.24)
GFR, Estimated: 18 mL/min — ABNORMAL LOW (ref >60)
Glucose, Bld: 125 mg/dL — ABNORMAL HIGH (ref 70–99)
Potassium: 3.7 mmol/L (ref 3.5–5.1)
Sodium: 130 mmol/L — ABNORMAL LOW (ref 135–145)
Total Bilirubin: 4.7 mg/dL — ABNORMAL HIGH (ref 0.3–1.2)
Total Protein: 6 g/dL — ABNORMAL LOW (ref 6.5–8.1)

## 2021-11-26 LAB — CBC
HCT: 36 % — ABNORMAL LOW (ref 39.0–52.0)
Hemoglobin: 11.9 g/dL — ABNORMAL LOW (ref 13.0–17.0)
MCH: 30.7 pg (ref 26.0–34.0)
MCHC: 33.1 g/dL (ref 30.0–36.0)
MCV: 93 fL (ref 80.0–100.0)
Platelets: 97 10*3/uL — ABNORMAL LOW (ref 150–400)
RBC: 3.87 MIL/uL — ABNORMAL LOW (ref 4.22–5.81)
RDW: 19.2 % — ABNORMAL HIGH (ref 11.5–15.5)
WBC: 6.8 10*3/uL (ref 4.0–10.5)
nRBC: 1.5 % — ABNORMAL HIGH (ref 0.0–0.2)

## 2021-11-26 LAB — COOXEMETRY PANEL
Carboxyhemoglobin: 1.4 % (ref 0.5–1.5)
Methemoglobin: <0.7 % (ref 0.0–1.5)
O2 Saturation: 60.5 %
Total hemoglobin: 13.2 g/dL (ref 12.0–16.0)

## 2021-11-26 LAB — GLUCOSE, CAPILLARY
Glucose-Capillary: 113 mg/dL — ABNORMAL HIGH (ref 70–99)
Glucose-Capillary: 180 mg/dL — ABNORMAL HIGH (ref 70–99)
Glucose-Capillary: 151 mg/dL — ABNORMAL HIGH (ref 70–99)

## 2021-11-26 LAB — PROTIME-INR
INR: >10 (ref 0.8–1.2)
Prothrombin Time: >90 seconds — ABNORMAL HIGH (ref 11.4–15.2)

## 2021-11-26 LAB — PHOSPHORUS: Phosphorus: 4.6 mg/dL (ref 2.5–4.6)

## 2021-11-26 LAB — LACTIC ACID, PLASMA
Lactic Acid, Venous: 1.8 mmol/L (ref 0.5–1.9)
Lactic Acid, Venous: 1.7 mmol/L (ref 0.5–1.9)
Lactic Acid, Venous: 1.9 mmol/L (ref 0.5–1.9)

## 2021-11-26 LAB — MRSA NEXT GEN BY PCR, NASAL: MRSA by PCR Next Gen: NOT DETECTED

## 2021-11-26 LAB — SODIUM, URINE, RANDOM: Sodium, Ur: <10 mmol/L

## 2021-11-26 LAB — MAGNESIUM: Magnesium: 1.6 mg/dL — ABNORMAL LOW (ref 1.7–2.4)

## 2021-11-26 LAB — PROCALCITONIN: Procalcitonin: 6.14 ng/mL

## 2021-11-26 LAB — CREATININE, URINE, RANDOM: Creatinine, Urine: 165.97 mg/dL

## 2021-11-26 MED ORDER — PHYTONADIONE 5 MG PO TABS
5.0000 mg | ORAL_TABLET | Freq: Once | ORAL | Status: AC
  Administered 2021-11-26: 5 mg via ORAL
  Filled 2021-11-26: qty 1

## 2021-11-26 MED ORDER — DOBUTAMINE IN D5W 4-5 MG/ML-% IV SOLN
2.5000 ug/kg/min | INTRAVENOUS | Status: DC
  Administered 2021-11-26: 2.5 ug/kg/min via INTRAVENOUS
  Filled 2021-11-26: qty 250

## 2021-11-26 MED ORDER — NOREPINEPHRINE 4 MG/250ML-% IV SOLN
0.0000 ug/min | INTRAVENOUS | Status: DC
  Administered 2021-11-26: 5 ug/min via INTRAVENOUS
  Administered 2021-11-26: 30 ug/min via INTRAVENOUS
  Filled 2021-11-26 (×2): qty 250

## 2021-11-26 MED ORDER — MAGNESIUM SULFATE 2 GM/50ML IV SOLN
2.0000 g | Freq: Once | INTRAVENOUS | Status: AC
  Administered 2021-11-26: 2 g via INTRAVENOUS
  Filled 2021-11-26: qty 50

## 2021-11-26 MED ORDER — MAGNESIUM SULFATE IN D5W 1-5 GM/100ML-% IV SOLN
1.0000 g | Freq: Once | INTRAVENOUS | Status: DC
Start: 2021-11-26 — End: 2021-11-26
  Filled 2021-11-26: qty 100

## 2021-11-26 MED ORDER — SODIUM CHLORIDE 0.9% FLUSH: 10.0000 mL | INTRAVENOUS | Status: DC | PRN

## 2021-11-26 MED ORDER — NOREPINEPHRINE 16 MG/250ML-% IV SOLN
0.0000 ug/min | INTRAVENOUS | Status: DC
  Administered 2021-11-26: 30 ug/min via INTRAVENOUS
  Administered 2021-11-26 – 2021-11-27 (×3): 40 ug/min via INTRAVENOUS
  Filled 2021-11-26 (×4): qty 250

## 2021-11-26 MED ORDER — SODIUM CHLORIDE 0.9% FLUSH
10.0000 mL | Freq: Two times a day (BID) | INTRAVENOUS | Status: DC
  Administered 2021-11-26 (×3): 10 mL

## 2021-11-26 MED ORDER — PHYTONADIONE 1 MG/0.5 ML ORAL SOLUTION
5.0000 mg | Freq: Once | ORAL | Status: DC
  Filled 2021-11-26: qty 2.5

## 2021-11-26 MED ORDER — SODIUM CHLORIDE 0.9 % IV SOLN: INTRAVENOUS | Status: DC | PRN

## 2021-11-26 MED ORDER — VASOPRESSIN 20 UNITS/100 ML INFUSION FOR SHOCK
0.0000 [IU]/min | INTRAVENOUS | Status: DC
  Administered 2021-11-26 – 2021-11-27 (×3): 0.03 [IU]/min via INTRAVENOUS
  Filled 2021-11-26 (×3): qty 100

## 2021-11-26 MED ORDER — CHLORHEXIDINE GLUCONATE CLOTH 2 % EX PADS
6.0000 | MEDICATED_PAD | Freq: Every day | CUTANEOUS | Status: DC
  Administered 2021-11-26 – 2021-11-27 (×3): 6 via TOPICAL

## 2021-11-26 NOTE — Evaluation (Signed)
Physical Therapy Evaluation Patient Details Name: Roberto [name] MRN: [id number] DOB: Mar 29, 1961 Today's Date: 11/26/2021  History of Present Illness  Pt is a 61 y.o. male who presented 11/25/2021 with increasing weakness and low back pain. Pt admitted with AKI on CKD, elevated LFTs, and acute on chornic CHF. Lumbar spine MRI was without any acute findings. CT negative for acute intracranial abnormalities. PMH: morbid obesity, DM2, HTN, OSA on bipap, chronic AF, COPD, DVT/PE, CKD 3b and PAH/cor pulmonale.   Clinical Impression  Pt presents with condition above and deficits mentioned below, see PT Problem List. PTA, he was mod I with a rollator for short household distances and an Transport planner for community mobility. Pt lives alone with limited support in a bottom-floor 1-level apartment with a level entry. Pt is currently limited by lethargy and low BP today, see General Comments below. Due to lightheadedness and low BP sitting EOB when legs were not moving (improved with leg AROM), deferred from transfers OOB today for safety. Pt requiring modA for bed mobility secondary to deficits in overall strength, balance, and activity tolerance. Pt would benefit from short-term rehab at a SNF prior to returning home. However, if pt declines SNF then recommend max HH services. Will continue to follow acutely.     Recommendations for follow up therapy are one component of a multi-disciplinary discharge planning process, led by the attending physician.  Recommendations may be updated based on patient status, additional functional criteria and insurance authorization.  Follow Up Recommendations Skilled nursing-short term rehab (<3 hours/day) (if goes home instead then max [facility] services)    Assistance Recommended at Discharge Frequent or constant Supervision/Assistance  Patient can return home with the following  A lot of help with walking and/or transfers;A lot of help with bathing/dressing/bathroom;Assistance  with cooking/housework;Assist for transportation;Help with stairs or ramp for entrance    Equipment Recommendations BSC/3in1;Hospital bed (bariatric all; air mattress)  Recommendations for Other Services  OT consult    Functional Status Assessment Patient has had a recent decline in their functional status and demonstrates the ability to make significant improvements in function in a reasonable and predictable amount of time.     Precautions / Restrictions Precautions Precautions: Fall;Other (comment) Precaution Comments: monitor BP (low) and SpO2; on 5L O2 baseline Restrictions Weight Bearing Restrictions: No      Mobility  Bed Mobility Overal bed mobility: Needs Assistance Bed Mobility: Supine to Sit, Sit to Supine     Supine to sit: Mod assist, HOB elevated Sit to supine: Max assist   General bed mobility comments: Pt slowly initiating legs to L EOB, needing asisstance to complete legs off EOB and modA to ascend trunk using bed rails. MaxA to lift legs and pivot body back to supine.    Transfers                   General transfer comment: deferred due to low BP    Ambulation/Gait               General Gait Details: deferred due to low BP  Stairs            Wheelchair Mobility    Modified Rankin (Stroke Patients Only)       Balance Overall balance assessment: Needs assistance Sitting-balance support: No upper extremity supported, Feet supported Sitting balance-Leahy Scale: Fair Sitting balance - Comments: Static sitting EOB with min guard for safety, inferior gaze       Standing balance comment: deferred due to  low BP                             Pertinent Vitals/Pain Pain Assessment Pain Assessment: Faces Faces Pain Scale: Hurts a little bit Pain Location: generalized with mobility Pain Descriptors / Indicators: Grimacing Pain Intervention(s): Limited activity within patient's tolerance, Monitored during session,  Repositioned    Home Living Family/patient expects to be discharged to:: Private residence Living Arrangements: Alone Available Help at Discharge: Family;Available PRN/intermittently (one daughter works and one he does not have contact with) Type of Home: Apartment (bottom floor) Home Access: Level entry       Home Layout: One level Home Equipment: [place] (4 wheels);Cane - single point;Shower seat;Grab bars - tub/shower;Electric scooter;Other (comment) (has step to get up over tub)      Prior Function Prior Level of Function : Independent/Modified Independent             Mobility Comments: Pt uses rollator inside home for short distances. Uses electric scooter outside home. ADLs Comments: Does all cleaning, cooking, showering, and dressing mod I, primarily in sitting.     Hand Dominance        Extremity/Trunk Assessment   Upper Extremity Assessment Upper Extremity Assessment: Defer to OT evaluation    Lower Extremity Assessment Lower Extremity Assessment: Generalized weakness (edema noted and functional weakness; hx of peripheral neuropathy)    Cervical / Trunk Assessment Cervical / Trunk Assessment: Other exceptions Cervical / Trunk Exceptions: increased body habitus  Communication   Communication: Other (comment) (soft spoken)  Cognition Arousal/Alertness: Lethargic Behavior During Therapy: Flat affect Overall Cognitive Status: No family/caregiver present to determine baseline cognitive functioning                                 General Comments: Pt slow to respond, but very lethargic with low BP today. Likely at his baseline, appearing to answer and respond to all questions and cues appropriately.        General Comments General comments (skin integrity, edema, etc.): BP 70s/40-50s supine in bed, 67/54 sitting EOB initially, 91/64 sitting EOB with LAQs, 78/58 sitting EOB once not moving legs anymore, 89/57 sitting EOB with leg movement again,  74/52 supine end of session; lightheadedness noted EOB    Exercises General Exercises - Lower Extremity Ankle Circles/Pumps: AROM, Both, 10 reps, Seated Long Arc Quad: AROM, Strengthening, Both, 10 reps, Seated Hip ABduction/ADduction: AROM, Strengthening, Both, 10 reps, Seated   Assessment/Plan    PT Assessment Patient needs continued PT services  PT Problem List Decreased strength;Decreased activity tolerance;Decreased balance;Decreased mobility;Cardiopulmonary status limiting activity;Impaired sensation;Obesity       PT Treatment Interventions DME instruction;Gait training;Functional mobility training;Therapeutic activities;Therapeutic exercise;Balance training;Neuromuscular re-education;Patient/family education;Wheelchair mobility training    PT Goals (Current goals can be found in the Care Plan section)  Acute Rehab PT Goals Patient Stated Goal: to go home PT Goal Formulation: With patient Time For Goal Achievement: 12/10/21 Potential to Achieve Goals: Good    Frequency Min 3X/week     Co-evaluation               AM-PAC PT "6 Clicks" Mobility  Outcome Measure Help needed turning from your back to your side while in a flat bed without using bedrails?: A Lot Help needed moving from lying on your back to sitting on the side of a flat bed without using bedrails?: A Lot  Help needed moving to and from a bed to a chair (including a wheelchair)?: Total Help needed standing up from a chair using your arms (e.g., wheelchair or bedside chair)?: Total Help needed to walk in hospital room?: Total Help needed climbing 3-5 steps with a railing? : Total 6 Click Score: 8    End of Session Equipment Utilized During Treatment: Oxygen Activity Tolerance: Patient limited by lethargy;Other (comment) (limited by low BP) Patient left: in bed;with call bell/phone within reach Nurse Communication: Mobility status;Other (comment) (BP) PT Visit Diagnosis: Muscle weakness (generalized)  (M62.81);Difficulty in walking, not elsewhere classified (R26.2);Dizziness and giddiness (R42)    Time: 3435-6861 PT Time Calculation (min) (ACUTE ONLY): 39 min   Charges:   PT Evaluation $PT Eval Moderate Complexity: 1 Mod PT Treatments $Therapeutic Exercise: 8-22 mins $Therapeutic Activity: 8-22 mins        Moishe Spice, PT, DPT Acute Rehabilitation Services  Pager: [phone] Office: [phone]   Orvan Falconer 11/26/2021, 10:41 AM

## 2021-11-26 NOTE — Progress Notes (Signed)
PCCM Brief progress note ? ?Called to bedside by Elink.  Bedside nurse asking for addition of third line vasopressor.  [place] MD would like CODE STATUS discussion with patient. ? ?Reviewed PCCM, heart failure, palliative care notes. ? ?Patient GCS 15, RASS 0, CAM-ICU NEG ?On Levophed, vasopressin, dobutamine ? ?Introduced myself to Roberto [name].  Notified him that he was continuing to get sicker and developed worsening shock.  Explained that we are adding medications to help support his blood pressure however this intervention did not fix the fundamental issues going on with his heart.  Explained that the situation CPR would likely do more harm than good.  Also discussed that intubation would not improve the fundamental issues that he was having. ? ?Roberto [name] expressed that at this point he was really just wanting to see his daughter in the morning.  He would like to talk to him about his estate, papers, and have discussions.  He felt like he was breathing okay at this time.  He expressed that he would not want to be endotracheally intubated.  However he expressed that if he were to go into cardiac arrest he would like [place] to try and resuscitate him with chest compressions.  [name] RN present for this conversation. ? ?Once again reaffirmed with the patient that he would not like to be intubated. He agreed. ? ?Plan ?-Continue current medical support, CPR in case of cardiac arrest ?-Will place DNI orders ?-If patient declines from respiratory standpoint utilize BiPAP ?-Continue palliative care discussions in the morning ? ?[employer] MSN, APRN, AGACNP-BC ?[facility]  ?11/26/2021 , 9:48 PM ? ?Please see [email or web address] for pager details ? ?If no response, please call [phone] ?After hours, please call Elink at [phone] ? ?

## 2021-11-26 NOTE — Progress Notes (Signed)
PROGRESS NOTE    Roberto [name]  PPI:[id number] DOB: 12-Jun-1961 DOA: 12/10/2021 PCP: Wilburt Finlay, MD    Brief Narrative:  61 year old gentleman with history of severe pulmonary artery hypertension, obesity hypoventilation syndrome and sleep apnea on CPAP at night, chronic hypoxemic respiratory failure on 5 L oxygen, CKD stage IIIb with baseline creatinine 1.5-2, paroxysmal A-fib, history of pulmonary embolism, type 2 diabetes on Trulicity presented to the ER with progressive weakness for about a week, diarrhea, low back pain and extreme debility.  In the emergency room blood pressure stable.  Mildly elevated LFTs with a bilirubin of 4, creatinine 3.1.  COVID-negative.  Chest x-ray with vascular congestion.  INR 5.3.   Assessment & Plan:   Acute on chronic right-sided heart failure with cardiogenic shock/cor pulmonale/pulmonary hypertension: Severe pulmonary hypertension. Currently remains on Lasix bolus and subsequent Lasix infusion. Was on milrinone, changed to dobutamine today with low blood pressures. Strict intake and output monitoring.  Will insert Foley catheter as he is not producing any urine. Followed by advanced heart failure team, poor prognosis.  Seen by palliative care but patient willing to continue aggressive treatment.  Acute kidney injury on chronic kidney disease stage IIIb: Baseline creatinine about 1.5-2.  Consistent with cardiorenal syndrome.  Currently remains on Lasix and dobutamine.  No urine output overnight. Place Foley catheter to measure strict urine output.  He is not a candidate for dialysis given severe right-sided heart failure.  Abnormal LFTs: Mildly elevated transaminases and bilirubin consistent with congestive hepatopathy.  Levels remained stable.  We will continue to monitor.  Chronic atrial fibrillation: Currently rate controlled.  Supratherapeutic INR.  INR pending from today.  Chronic hypoxemic and hypercapnic respiratory failure /obstructive  sleep apnea: On CPAP.  Hypomagnesemia: Replacing with IV magnesium.   DVT prophylaxis:   Warfarin.   Code Status: Full code. Family Communication: None.  I offered to call patient's daughter, he asked me not to call her. Disposition Plan: Status is: Inpatient Remains inpatient appropriate because: Active treatment for cardiogenic shock.   Consultants:  Cardiology Palliative care  Procedures:  None  Antimicrobials:  None   Subjective: Patient seen and examined.  Still feels very dry, he is thirsty.  Tells me he had noted a single drop of urine overnight.  Blood pressures remain low on Lasix and milrinone.  Denies any chest pain.  Objective: Vitals:   11/26/21 0951 11/26/21 0953 11/26/21 1001 11/26/21 1008  BP: (!) 78/58 (!) 89/57 (!) 74/52 (!) 99/54  Pulse: 100 (!) 101 99 98  Resp: _0 Temp:      TempSrc:      SpO2: 93% 93% 92% 94%  Weight:        Intake/Output Summary (Last 24 hours) at 11/26/2021 1129 Last data filed at 11/25/2021 2337 Gross per 24 hour  Intake 267.44 ml  Output --  Net 267.44 ml   Filed Weights   11/25/21 1300  Weight: 133.8 kg    Examination:  General exam: Morbidly obese.  Frail and debilitated.  Chronically sick looking.  Anxious in mild distress.  Currently on 5 L oxygen. He does have some open skin lesions with scratching. Respiratory system: Clear to auscultation. Respiratory effort normal.  No added sounds. Cardiovascular system: S1 & S2 heard, RRR.  Both legs on Unna boot. Gastrointestinal system: Distended.  Nontender.  Obese and pendulous.   Central nervous system: Alert and oriented. No focal neurological deficits. Extremities: Symmetric 5 x 5 power. Skin: No rashes, lesions  or ulcers Psychiatry: Judgement and insight appear normal.  Appropriately anxious.    Data Reviewed: I have personally reviewed following labs and imaging studies  CBC: Recent Labs  Lab 12/20/2021 1347 11/25/21 0536  WBC 6.9 6.7  NEUTROABS   --  5.0  HGB 15.2 15.2  HCT 44.8 45.3  MCV 92.8 93.8  PLT 115* 710*   Basic Metabolic Panel: Recent Labs  Lab 12/07/2021 1347 12/07/2021 2152 11/25/21 0536 11/26/21 0505  NA 129*  --  127* 130*  K 3.5  --  3.9 3.7  CL 88*  --  89* 86*  CO2 23  --  19* 24  GLUCOSE 99  --  106* 125*  BUN 86*  --  93* 104*  CREATININE 3.10*  --  3.23* 3.64*  CALCIUM 9.0  --  8.6* 8.4*  MG  --  1.2* 1.3* 1.6*  PHOS  --   --   --  4.6   GFR: Estimated Creatinine Clearance: 28.9 mL/min (A) (by C-G formula based on SCr of 3.64 mg/dL (H)). Liver Function Tests: Recent Labs  Lab 12/21/2021 2152 11/25/21 0536 11/26/21 0505  AST 109* 95* 86*  ALT 73* 67* 63*  ALKPHOS 119 105 100  BILITOT 4.0* 4.5* 4.7*  PROT 7.3 6.5 6.0*  ALBUMIN 3.9 3.5 3.0*   Recent Labs  Lab 11/30/2021 2152  LIPASE 28   Recent Labs  Lab 12/01/2021 2154  AMMONIA 47*   Coagulation Profile: Recent Labs  Lab 12/05/2021 2152  INR 5.3*   Cardiac Enzymes: No results for input(s): CKTOTAL, CKMB, CKMBINDEX, TROPONINI in the last 168 hours. BNP (last 3 results) No results for input(s): PROBNP in the last 8760 hours. HbA1C: Recent Labs    11/25/21 0536  HGBA1C 5.5   CBG: Recent Labs  Lab 11/25/21 0815 11/25/21 1253 11/25/21 1717  GLUCAP 103* 107* 136*   Lipid Profile: No results for input(s): CHOL, HDL, LDLCALC, TRIG, CHOLHDL, LDLDIRECT in the last 72 hours. Thyroid Function Tests: No results for input(s): TSH, T4TOTAL, FREET4, T3FREE, THYROIDAB in the last 72 hours. Anemia Panel: No results for input(s): VITAMINB12, FOLATE, FERRITIN, TIBC, IRON, RETICCTPCT in the last 72 hours. Sepsis Labs: Recent Labs  Lab 11/23/2021 2314 11/25/21 0536  LATICACIDVEN 2.1* 2.4*    Recent Results (from the past 240 hour(s))  Resp Panel by RT-PCR (Flu A&B, Covid)     Status: None   Collection Time: 12/17/2021 11:50 PM   Specimen: Nasopharyngeal(NP) swabs in vial transport medium  Result Value Ref Range Status   SARS Coronavirus  2 by RT PCR NEGATIVE NEGATIVE Final    Comment: (NOTE) SARS-CoV-2 target nucleic acids are NOT DETECTED.  The SARS-CoV-2 RNA is generally detectable in upper respiratory specimens during the acute phase of infection. The lowest concentration of SARS-CoV-2 viral copies this assay can detect is 138 copies/mL. A negative result does not preclude SARS-Cov-2 infection and should not be used as the sole basis for treatment or other patient management decisions. A negative result may occur with  improper specimen collection/handling, submission of specimen other than nasopharyngeal swab, presence of viral mutation(s) within the areas targeted by this assay, and inadequate number of viral copies(<138 copies/mL). A negative result must be combined with clinical observations, patient history, and epidemiological information. The expected result is Negative.  Fact Sheet for Patients:  EntrepreneurPulse.com.au  Fact Sheet for Healthcare Providers:  IncredibleEmployment.be  This test is no t yet approved or cleared by the [place] FDA and  has been  authorized for detection and/or diagnosis of SARS-CoV-2 by FDA under an Emergency Use Authorization (EUA). This EUA will remain  in effect (meaning this test can be used) for the duration of the COVID-19 declaration under Section 564(b)(1) of the Act, 21 U.S.C.section 360bbb-3(b)(1), unless the authorization is terminated  or revoked sooner.       Influenza A by PCR NEGATIVE NEGATIVE Final   Influenza B by PCR NEGATIVE NEGATIVE Final    Comment: (NOTE) The Xpert Xpress SARS-CoV-2/FLU/RSV plus assay is intended as an aid in the diagnosis of influenza from Nasopharyngeal swab specimens and should not be used as a sole basis for treatment. Nasal washings and aspirates are unacceptable for Xpert Xpress SARS-CoV-2/FLU/RSV testing.  Fact Sheet for Patients: EntrepreneurPulse.com.au  Fact  Sheet for Healthcare Providers: IncredibleEmployment.be  This test is not yet approved or cleared by the [place] FDA and has been authorized for detection and/or diagnosis of SARS-CoV-2 by FDA under an Emergency Use Authorization (EUA). This EUA will remain in effect (meaning this test can be used) for the duration of the COVID-19 declaration under Section 564(b)(1) of the Act, 21 U.S.C. section 360bbb-3(b)(1), unless the authorization is terminated or revoked.  Performed at [facility], [place] [address] [place], [place] [address]          Radiology Studies: CT HEAD WO CONTRAST (5MM)  Result Date: 11/25/2021 CLINICAL DATA:  Increasing weakness EXAM: CT HEAD WITHOUT CONTRAST TECHNIQUE: Contiguous axial images were obtained from the base of the skull through the vertex without intravenous contrast. RADIATION DOSE REDUCTION: This exam was performed according to the departmental dose-optimization program which includes automated exposure control, adjustment of the mA and/or kV according to patient size and/or use of iterative reconstruction technique. COMPARISON:  None. FINDINGS: Brain: There is no evidence of acute intracranial hemorrhage, extra-axial fluid collection, or acute infarct Background parenchymal volume is normal. The ventricles are normal in size. Gray-white differentiation is preserved. There is no mass lesion. There is no midline shift. Vascular: No hyperdense vessel or unexpected calcification. Skull: Normal. Negative for fracture or focal lesion. Sinuses/Orbits: There is mild mucosal thickening in the paranasal sinuses. The globes and orbits are unremarkable. Other: None. IMPRESSION: No acute intracranial pathology. Electronically Signed   By: Valetta Mole M.D.   On: 11/25/2021 08:14   MR LUMBAR SPINE WO CONTRAST  Result Date: 11/25/2021 CLINICAL DATA:  61 year old male with weakness, myelopathy. EXAM: MRI LUMBAR SPINE WITHOUT CONTRAST TECHNIQUE:  Multiplanar, multisequence MR imaging of the lumbar spine was performed. No intravenous contrast was administered. COMPARISON:  None. FINDINGS: Segmentation: Lumbar segmentation appears to be normal and will be designated as such for this report. Alignment: Preserved lumbar lordosis. No significant spondylolisthesis or scoliosis. Vertebrae: No marrow edema or evidence of acute osseous abnormality. Mild degenerative endplate marrow signal changes primarily at L3. Background bone marrow signal within normal limits. Occasional benign vertebral body hemangiomas. Intact visible sacrum and SI joints. Conus medullaris and cauda equina: Conus extends to the T12-L1 level. No lower spinal cord or conus signal abnormality. Fairly capacious spinal canal. Normal cauda equina nerve roots. Paraspinal and other soft tissues: Polycystic renal changes bilaterally. Otherwise negative visible abdominal viscera. Somewhat featureless appearance of the distal colon in the pelvis. Partially visible distended urinary bladder. Disc levels: T11-T12: Negative. T12-L1:  Negative. L1-L2:  Negative. L2-L3:  Minor disc desiccation and disc bulging.  No stenosis. L3-L4: Disc desiccation and disc space loss. Probable trace vacuum disc. Mild circumferential disc bulge. Mild facet and  ligament flavum hypertrophy. Trace degenerative facet joint fluid. But no convincing spinal stenosis or neural impingement at this level. L4-L5: Disc desiccation, mild disc bulge, posterior element hypertrophy. Trace degenerative facet joint fluid. No convincing stenosis. L5-S1: Mild disc desiccation with a broad-based central disc protrusion best seen on series 8, image 31 and series 5, image 10. Mild to moderate facet hypertrophy, but capacious spinal canal with no spinal or lateral recess stenosis. No foraminal stenosis. IMPRESSION: 1. Mild for age lumbar spine degeneration, L3-L4 through L5-S1. Small disc herniation at the latter but no convincing lumbar spinal  stenosis or neural impingement. Normal appearance of the conus and cauda equina nerve roots. 2. Polycystic renal disease. Somewhat featureless appearance of the visible distal colon in the pelvis. Query acute or chronic colitis. Electronically Signed   By: Genevie Ann M.D.   On: 11/25/2021 10:32   DG CHEST PORT 1 VIEW  Result Date: 11/26/2021 CLINICAL DATA:  PICC placement. EXAM: PORTABLE CHEST 1 VIEW COMPARISON:  Radiograph 12/13/2021 FINDINGS: Right upper extremity PICC tip overlies the mid lower SVC. No pneumothorax. Unchanged cardiomegaly. Increasing vascular congestion. Increasing patchy bibasilar opacities. Left costophrenic angle is excluded from the field of view. No large pleural effusion. IMPRESSION: 1. Tip of the right upper extremity PICC in the mid lower SVC. 2. Unchanged cardiomegaly. Increasing vascular congestion. Increasing patchy bibasilar opacities may be atelectasis or basilar edema. Electronically Signed   By: Keith Rake M.D.   On: 11/26/2021 01:58   DG Chest Portable 1 View  Result Date: 12/20/2021 CLINICAL DATA:  Generalized weakness for 1 week, short of breath EXAM: PORTABLE CHEST 1 VIEW COMPARISON:  09/22/2021 FINDINGS: 2 frontal views of the chest demonstrate marked enlargement the cardiac silhouette. Chronic central vascular prominence without airspace disease, effusion, or pneumothorax. Left costophrenic angle is excluded by collimation. No acute bony abnormalities. IMPRESSION: 1. Enlarged cardiac silhouette. 2. Chronic central vascular congestion without airspace disease. Electronically Signed   By: Randa Ngo M.D.   On: 12/04/2021 21:28   ECHOCARDIOGRAM COMPLETE  Result Date: 11/25/2021    ECHOCARDIOGRAM REPORT   Patient Name:   CRESTON Krauss Date of Exam: 11/25/2021 Medical Rec #:  [id number]      Height:       69.0 in Accession #:    [id number]     Weight:       295.0 lb Date of Birth:  08/25/1961      BSA:          2.437 m Patient Age:    78 years       BP:            110/73 mmHg Patient Gender: M              HR:           97 bpm. Exam Location:  Inpatient Procedure: 2D Echo, Cardiac Doppler and Color Doppler Indications:     Z49.44 Chronic diastolic (congestive) heart failure  History:         Patient has prior history of Echocardiogram examinations, most                  recent 08/16/2021. CHF, Abnormal ECG, COPD,                  Arrythmias:Bradycardia, Signs/Symptoms:Dyspnea and Shortness of                  Breath; Risk Factors:Diabetes and Hypertension. Cor pulmonale.  Sonographer:  Roseanna Rainbow RDCS Referring Phys:  [place] Diagnosing Phys: Jenkins Rouge MD  Sonographer Comments: Technically difficult study due to poor echo windows and patient is morbidly obese. Image acquisition challenging due to patient body habitus. Patient immobile in bariatric bed. IMPRESSIONS  1. Left ventricular ejection fraction, by estimation, is 60 to 65%. The left ventricle has normal function. The left ventricle has no regional wall motion abnormalities. Left ventricular diastolic parameters are indeterminate.  2. Severe RV failure with both systolic and diastolic flattening of the septum . Right ventricular systolic function is severely reduced. The right ventricular size is severely enlarged. There is severely elevated pulmonary artery systolic pressure.  3. Right atrial size was severely dilated.  4. The pericardial effusion is posterior to the left ventricle.  5. MR is eccentric and likely moderate range Difficult to characterize given massive RA/RV enlargement . The mitral valve is normal in structure. Moderate mitral valve regurgitation. No evidence of mitral stenosis.  6. Tricuspid valve regurgitation is moderate.  7. The aortic valve is tricuspid. There is mild calcification of the aortic valve. Aortic valve regurgitation is not visualized. Aortic valve sclerosis is present, with no evidence of aortic valve stenosis.  8. Aortic dilatation noted. There is moderate  dilatation of the ascending aorta, measuring 44 mm.  9. The inferior vena cava is dilated in size with <50% respiratory variability, suggesting right atrial pressure of 15 mmHg. FINDINGS  Left Ventricle: Left ventricular ejection fraction, by estimation, is 60 to 65%. The left ventricle has normal function. The left ventricle has no regional wall motion abnormalities. The left ventricular internal cavity size was normal in size. There is  no left ventricular hypertrophy. Left ventricular diastolic parameters are indeterminate. Right Ventricle: Severe RV failure with both systolic and diastolic flattening of the septum. The right ventricular size is severely enlarged. No increase in right ventricular wall thickness. Right ventricular systolic function is severely reduced. There  is severely elevated pulmonary artery systolic pressure. The tricuspid regurgitant velocity is 4.42 m/s, and with an assumed right atrial pressure of 20 mmHg, the estimated right ventricular systolic pressure is 76.7 mmHg. Left Atrium: Left atrial size was normal in size. Right Atrium: Right atrial size was severely dilated. Pericardium: There is no evidence of pericardial effusion. The pericardial effusion is posterior to the left ventricle. Mitral Valve: MR is eccentric and likely moderate range Difficult to characterize given massive RA/RV enlargement. The mitral valve is normal in structure. Moderate mitral valve regurgitation. No evidence of mitral valve stenosis. Tricuspid Valve: The tricuspid valve is normal in structure. Tricuspid valve regurgitation is moderate . No evidence of tricuspid stenosis. Aortic Valve: The aortic valve is tricuspid. There is mild calcification of the aortic valve. Aortic valve regurgitation is not visualized. Aortic valve sclerosis is present, with no evidence of aortic valve stenosis. Pulmonic Valve: The pulmonic valve was normal in structure. Pulmonic valve regurgitation is not visualized. No evidence of  pulmonic stenosis. Aorta: Aortic dilatation noted. There is moderate dilatation of the ascending aorta, measuring 44 mm. Venous: The inferior vena cava is dilated in size with less than 50% respiratory variability, suggesting right atrial pressure of 15 mmHg. IAS/Shunts: No atrial level shunt detected by color flow Doppler.  LEFT VENTRICLE PLAX 2D LVIDd:         3.40 cm     Diastology LVIDs:         1.90 cm     LV e' medial:    6.45 cm/s LV  PW:         1.80 cm     LV E/e' medial:  13.6 LV IVS:        1.60 cm     LV e' lateral:   6.53 cm/s LVOT diam:     2.50 cm     LV E/e' lateral: 13.4 LV SV:         54 LV SV Index:   22 LVOT Area:     4.91 cm  LV Volumes (MOD) LV vol d, MOD A4C: 39.7 ml LV vol s, MOD A4C: 10.3 ml LV SV MOD A4C:     39.7 ml RIGHT VENTRICLE            IVC RV S prime:     9.15 cm/s  IVC diam: 2.50 cm TAPSE (M-mode): 0.6 cm LEFT ATRIUM             Index        RIGHT ATRIUM           Index LA diam:        5.30 cm 2.17 cm/m   RA Area:     49.80 cm LA Vol (A2C):   57.9 ml 23.76 ml/m  RA Volume:   214.00 ml 87.81 ml/m LA Vol (A4C):   31.5 ml 12.93 ml/m LA Biplane Vol: 39.8 ml 16.33 ml/m  AORTIC VALVE LVOT Vmax:   79.90 cm/s LVOT Vmean:  49.300 cm/s LVOT VTI:    0.110 m  AORTA Ao Root diam: 3.60 cm Ao Asc diam:  4.40 cm MITRAL VALVE                  TRICUSPID VALVE MV Area (PHT): 4.60 cm       TR Peak grad:   78.1 mmHg MV Decel Time: 165 msec       TR Vmax:        442.00 cm/s MR Peak grad:    86.5 mmHg MR Mean grad:    48.0 mmHg    SHUNTS MR Vmax:         465.00 cm/s  Systemic VTI:  0.11 m MR Vmean:        296.0 cm/s   Systemic Diam: 2.50 cm MR PISA:         5.09 cm MR PISA Eff ROA: 31 mm MR PISA Radius:  0.90 cm MV E velocity: 87.50 cm/s MV A velocity: 65.50 cm/s MV E/A ratio:  1.34 Jenkins Rouge MD Electronically signed by Jenkins Rouge MD Signature Date/Time: 11/25/2021/3:47:55 PM    Final (Updated)    [place] EKG SITE RITE  Result Date: 11/25/2021 If Site Rite image not attached, placement could  not be confirmed due to current cardiac rhythm.  US Abdomen Limited RUQ (LIVER/GB)  Result Date: 11/25/2021 CLINICAL DATA:  Abdominal pain. EXAM: ULTRASOUND ABDOMEN LIMITED RIGHT UPPER QUADRANT COMPARISON:  07/20/2021. FINDINGS: Gallbladder: Multiple stones are present in the gallbladder. There is no gallbladder wall thickening or pericholecystic fluid. No sonographic Murphy sign noted by sonographer. Common bile duct: Diameter: 5.1 mm.  No intrahepatic biliary ductal dilatation. Liver: No focal lesion identified. The liver has a nodular contour, suggesting underlying cirrhosis. Within normal limits in parenchymal echogenicity. Portal vein is patent on color Doppler imaging with normal direction of blood flow towards the liver. Other: No free fluid. IMPRESSION: 1. Cholelithiasis without acute cholecystitis. 2. Nodular contour of the liver suggesting underlying cirrhosis. Electronically Signed   By: Brett Fairy M.D.   On:  11/25/2021 03:40        Scheduled Meds:  Chlorhexidine Gluconate Cloth  6 each Topical Daily   gabapentin  200 mg Oral BID   insulin aspart  0-9 Units Subcutaneous TID WC   sodium chloride flush  10-40 mL Intracatheter Q12H   Continuous Infusions:  DOBUTamine 2.5 mcg/kg/min (11/26/21 1107)   furosemide (LASIX) 200 mg in dextrose 5% 100 mL (80m/mL) infusion 12 mg/hr (11/26/21 0748)     LOS: 1 day    Time spent: 35 minutes    KBarb Merino MD [facility] Pager [phone]

## 2021-11-26 NOTE — Progress Notes (Addendum)
eLink Physician-Brief Progress Note ?Patient Name: Roberto [name] ?DOB: [birth year] ?MRN: [id number] ? ? ?Date of Service ? 11/26/2021  ?HPI/Events of Note ? RN asking for third pressors. On Levo and dobutamine _0 .5 Failed milrinone and lasix gtt- made BP low. ? ?Advance   right heart heart failure, poor prognosis per Heart failure team, on 15 lit HFNC, alert and oriented per bed side RN discussion. Palliative care consult in AM. Full code so far. ? ?CxR CHF/volume overload Cr > 3. Not a CVVH candidate also.   ?  ?eICU Interventions ?  ?- start Vasopressin 0.03 for now. Will ask bed side CCM team for code status discussion.   ? ? ? ?Intervention Category ?Intermediate Interventions: Hypotension - evaluation and management ? ?Elmer Sow ?11/26/2021, 8:36 PM ? ?4 AM ?pt is tired of wearing CPAP mask. O2 sat on HFNC 15L is 88-92. Bedside wondering if pt can get order for heated high flow. ?now DNI, CPR for cardiac arrest. ?COPD/OSA. ?- HFNC for now, patient is aware of consequence of not wearing CPAP ? ?6:27 ?K still > 9. Was > 10 on 4 th, got 5 mg Vit K. ?Hg > 12. No active bleeding. PE hx. Warf on hold. ?Once stable on elevated INR, would consider eliquis.  ? ?Discussed with bed side RN.  ? ? ? ?

## 2021-11-26 NOTE — Progress Notes (Signed)
Milrinone stopped due to multiple BP's with systolic in 15'I.  Cardiology PA notified.  Will monitor closely.   ?

## 2021-11-26 NOTE — Progress Notes (Signed)
Pt BP becoming increasingly hypotensive and lethargic.  MD in room and new orders to transfer Pt to ICU.  Rapid response RN notified and in route to bedside to assist with transfer.   ? 11/26/21 1205  ?Vitals  ?BP (!) 53/40  ?MAP (mmHg) (!) 47  ?BP Location Left Arm  ?BP Method Automatic  ?Patient Position (if appropriate) Lying  ?Pulse Rate 95  ?Pulse Rate Source Monitor  ?ECG Heart Rate 96  ?Resp (!) 29  ?Level of Consciousness  ?Level of Consciousness Alert  ?Oxygen Therapy  ?SpO2 91 %  ?O2 Device HFNC  ?O2 Flow Rate (L/min) 10 L/min  ?Invasive Hemodynamic Monitoring  ?CVP (mmHg) 5 mmHg  ?Pain Assessment  ?Pain Scale 0-10  ?Pain Score 0  ?MEWS Score  ?MEWS Temp 0  ?MEWS Systolic 3  ?MEWS Pulse 0  ?MEWS RR 2  ?MEWS LOC 0  ?MEWS Score 5  ?MEWS Score Color Red  ?Provider Notification  ?Provider Name/Title McMclean MD  ?Date Provider Notified 11/26/21  ?Time Provider Notified 1205  ?Notification Type Face-to-face  ?Notification Reason Change in status  ?Provider response At bedside  ?Date of Provider Response 11/26/21  ?Time of Provider Response 1205 ?(MD in room rounding)  ?Rapid Response Notification  ?Name of Rapid Response RN Notified Kelli Churn RN  ?Date Rapid Response Notified 11/26/21  ?Time Rapid Response Notified 1207  ? ? ?

## 2021-11-26 NOTE — Progress Notes (Signed)
Pt. Arrived from ED, GCS-15 with infusions running in PIV rt arm. PICC nurse aware of pt. Status. CCMD notified. MD orders carried out. Will continue to observe closely. ?

## 2021-11-26 NOTE — Progress Notes (Signed)
Orthopedic Tech Progress Note ?Patient Details:  ?Roberto [name] ?[birth year] ?[id number] ? ?Ortho Devices ?Type of Ortho Device: Unna boot ?Ortho Device/Splint Location: bi-lateral ?Ortho Device/Splint Interventions: Ordered, Application, Adjustment ?  ?Post Interventions ?Patient Tolerated: Well ?Instructions Provided: Care of device, Adjustment of device ? ?Roberto [name] ?11/26/2021, 2:41 AM ? ?

## 2021-11-26 NOTE — Progress Notes (Signed)
Peripherally Inserted Central Catheter Placement ? ?The IV Nurse has discussed with the patient and/or persons authorized to consent for the patient, the purpose of this procedure and the potential benefits and risks involved with this procedure.  The benefits include less needle sticks, lab draws from the catheter, and the patient may be discharged home with the catheter. Risks include, but not limited to, infection, bleeding, blood clot (thrombus formation), and puncture of an artery; nerve damage and irregular heartbeat and possibility to perform a PICC exchange if needed/ordered by physician.  Alternatives to this procedure were also discussed.  Bard Power PICC patient education guide, fact sheet on infection prevention and patient information card has been provided to patient /or left at bedside.   ? ?PICC Placement Documentation  ?PICC Double Lumen 49/20/10 Right Cephalic 43 cm 0 cm (Active)  ?Indication for Insertion or Continuance of Line Vasoactive infusions 11/26/21 0203  ?Exposed Catheter (cm) 0 cm 11/26/21 0203  ?Site Assessment Clean, Dry, Intact 11/26/21 0203  ?Lumen #1 Status Flushed;Blood return noted;Saline locked 11/26/21 0203  ?Lumen #2 Status Flushed;Blood return noted;Saline locked 11/26/21 0203  ?Dressing Type Transparent;Securing device 11/26/21 0203  ?Dressing Status Clean, Dry, Intact;Antimicrobial disc in place 11/26/21 0203  ?Safety Lock Not Applicable 04/05/18 7588  ?Line Care Connections checked and tightened 11/26/21 0203  ?Line Adjustment (NICU/IV Team Only) No 11/26/21 0203  ?Dressing Intervention New dressing 11/26/21 0203  ?Dressing Change Due 12/03/21 11/26/21 0203  ? ? ? ? ? ?Eldra Word, Cathlyn Parsons ?11/26/2021, 2:55 AM ? ?

## 2021-11-26 NOTE — Progress Notes (Signed)
Advanced Heart Failure Rounding Note  PCP-Cardiologist: Rozann Lesches, MD   Subjective:    Minimal UOP, 300 cc in bladder on scan however.  He was on milrinone 0.25 overnight, BP started to drop this morning.  Milrinone stopped and dobutamine 2.5 started.  SBP now down to 60s and patient is more lethargic.    CVP 14-15 on my read this morning, co-ox 61%.  BUN up to 104 with creatinine 3.23=>3.64.   Diarrhea lessened.   Echo: EF 60-65%, severe RV failure with severe RV enlargement, moderate MR, moderate TR, IVC dilated, PASP 98 mmHg.    Objective:   Weight Range: 133.8 kg Body mass index is 43.56 kg/m.   Vital Signs:   Temp:  [97.8 F (36.6 C)-98.7 F (37.1 C)] 97.8 F (36.6 C) (03/04 0822) Pulse Rate:  [67-116] 98 (03/04 1008) Resp:  [18-36] 20 (03/04 1008) BP: (73-120)/(49-84) 99/54 (03/04 1008) SpO2:  [89 %-95 %] 94 % (03/04 1008) Weight:  [133.8 kg] 133.8 kg (03/03 1300)    Weight change: Filed Weights   11/25/21 1300  Weight: 133.8 kg    Intake/Output:   Intake/Output Summary (Last 24 hours) at 11/26/2021 1214 Last data filed at 11/25/2021 2337 Gross per 24 hour  Intake 267.44 ml  Output --  Net 267.44 ml      Physical Exam    General:  Well appearing. No resp difficulty HEENT: Normal Neck: Supple. JVP 14 cm. Carotids 2+ bilat; no bruits. No lymphadenopathy or thyromegaly appreciated. Cor: PMI nondisplaced. Regular rate & rhythm. No rubs, gallops or murmurs. Lungs: Clear Abdomen: Soft, nontender, nondistended. No hepatosplenomegaly. No bruits or masses. Good bowel sounds. Extremities: No cyanosis, clubbing, rash. Trace ankle edema.  Neuro: Alert & orientedx3, cranial nerves grossly intact. moves all 4 extremities w/o difficulty. Affect pleasant   Telemetry   Atrial fibrillation 90s-100s, personally reviewed  Labs    CBC Recent Labs    11/29/2021 1347 11/25/21 0536  WBC 6.9 6.7  NEUTROABS  --  5.0  HGB 15.2 15.2  HCT 44.8 45.3  MCV  92.8 93.8  PLT 115* 242*   Basic Metabolic Panel Recent Labs    11/25/21 0536 11/26/21 0505  NA 127* 130*  K 3.9 3.7  CL 89* 86*  CO2 19* 24  GLUCOSE 106* 125*  BUN 93* 104*  CREATININE 3.23* 3.64*  CALCIUM 8.6* 8.4*  MG 1.3* 1.6*  PHOS  --  4.6   Liver Function Tests Recent Labs    11/25/21 0536 11/26/21 0505  AST 95* 86*  ALT 67* 63*  ALKPHOS 105 100  BILITOT 4.5* 4.7*  PROT 6.5 6.0*  ALBUMIN 3.5 3.0*   Recent Labs    11/30/2021 2152  LIPASE 28   Cardiac Enzymes No results for input(s): CKTOTAL, CKMB, CKMBINDEX, TROPONINI in the last 72 hours.  BNP: BNP (last 3 results) Recent Labs    02/02/21 1730 08/29/21 1522 11/28/2021 2314  BNP 458.0* 487.1* 413.8*    ProBNP (last 3 results) No results for input(s): PROBNP in the last 8760 hours.   D-Dimer No results for input(s): DDIMER in the last 72 hours. Hemoglobin A1C Recent Labs    11/25/21 0536  HGBA1C 5.5   Fasting Lipid Panel No results for input(s): CHOL, HDL, LDLCALC, TRIG, CHOLHDL, LDLDIRECT in the last 72 hours. Thyroid Function Tests No results for input(s): TSH, T4TOTAL, T3FREE, THYROIDAB in the last 72 hours.  Invalid input(s): FREET3  Other results:   Imaging    DG  CHEST PORT 1 VIEW  Result Date: 11/26/2021 CLINICAL DATA:  PICC placement. EXAM: PORTABLE CHEST 1 VIEW COMPARISON:  Radiograph 12/12/2021 FINDINGS: Right upper extremity PICC tip overlies the mid lower SVC. No pneumothorax. Unchanged cardiomegaly. Increasing vascular congestion. Increasing patchy bibasilar opacities. Left costophrenic angle is excluded from the field of view. No large pleural effusion. IMPRESSION: 1. Tip of the right upper extremity PICC in the mid lower SVC. 2. Unchanged cardiomegaly. Increasing vascular congestion. Increasing patchy bibasilar opacities may be atelectasis or basilar edema. Electronically Signed   By: Keith Rake M.D.   On: 11/26/2021 01:58   ECHOCARDIOGRAM COMPLETE  Result Date:  11/25/2021    ECHOCARDIOGRAM REPORT   Patient Name:   Roberto [name] Date of Exam: 11/25/2021 Medical Rec #:  [id number]      Height:       69.0 in Accession #:    [id number]     Weight:       295.0 lb Date of Birth:  03-Aug-1961      BSA:          2.437 m Patient Age:    61 years       BP:           110/73 mmHg Patient Gender: M              HR:           97 bpm. Exam Location:  Inpatient Procedure: 2D Echo, Cardiac Doppler and Color Doppler Indications:     W73.71 Chronic diastolic (congestive) heart failure  History:         Patient has prior history of Echocardiogram examinations, most                  recent 08/16/2021. CHF, Abnormal ECG, COPD,                  Arrythmias:Bradycardia, Signs/Symptoms:Dyspnea and Shortness of                  Breath; Risk Factors:Diabetes and Hypertension. Cor pulmonale.  Sonographer:     Roseanna Rainbow RDCS Referring Phys:  [place] Diagnosing Phys: Jenkins Rouge MD  Sonographer Comments: Technically difficult study due to poor echo windows and patient is morbidly obese. Image acquisition challenging due to patient body habitus. Patient immobile in bariatric bed. IMPRESSIONS  1. Left ventricular ejection fraction, by estimation, is 60 to 65%. The left ventricle has normal function. The left ventricle has no regional wall motion abnormalities. Left ventricular diastolic parameters are indeterminate.  2. Severe RV failure with both systolic and diastolic flattening of the septum . Right ventricular systolic function is severely reduced. The right ventricular size is severely enlarged. There is severely elevated pulmonary artery systolic pressure.  3. Right atrial size was severely dilated.  4. The pericardial effusion is posterior to the left ventricle.  5. MR is eccentric and likely moderate range Difficult to characterize given massive RA/RV enlargement . The mitral valve is normal in structure. Moderate mitral valve regurgitation. No evidence of mitral stenosis.  6. Tricuspid  valve regurgitation is moderate.  7. The aortic valve is tricuspid. There is mild calcification of the aortic valve. Aortic valve regurgitation is not visualized. Aortic valve sclerosis is present, with no evidence of aortic valve stenosis.  8. Aortic dilatation noted. There is moderate dilatation of the ascending aorta, measuring 44 mm.  9. The inferior vena cava is dilated in size with <50% respiratory variability, suggesting right atrial  pressure of 15 mmHg. FINDINGS  Left Ventricle: Left ventricular ejection fraction, by estimation, is 60 to 65%. The left ventricle has normal function. The left ventricle has no regional wall motion abnormalities. The left ventricular internal cavity size was normal in size. There is  no left ventricular hypertrophy. Left ventricular diastolic parameters are indeterminate. Right Ventricle: Severe RV failure with both systolic and diastolic flattening of the septum. The right ventricular size is severely enlarged. No increase in right ventricular wall thickness. Right ventricular systolic function is severely reduced. There  is severely elevated pulmonary artery systolic pressure. The tricuspid regurgitant velocity is 4.42 m/s, and with an assumed right atrial pressure of 20 mmHg, the estimated right ventricular systolic pressure is 00.9 mmHg. Left Atrium: Left atrial size was normal in size. Right Atrium: Right atrial size was severely dilated. Pericardium: There is no evidence of pericardial effusion. The pericardial effusion is posterior to the left ventricle. Mitral Valve: MR is eccentric and likely moderate range Difficult to characterize given massive RA/RV enlargement. The mitral valve is normal in structure. Moderate mitral valve regurgitation. No evidence of mitral valve stenosis. Tricuspid Valve: The tricuspid valve is normal in structure. Tricuspid valve regurgitation is moderate . No evidence of tricuspid stenosis. Aortic Valve: The aortic valve is tricuspid. There is  mild calcification of the aortic valve. Aortic valve regurgitation is not visualized. Aortic valve sclerosis is present, with no evidence of aortic valve stenosis. Pulmonic Valve: The pulmonic valve was normal in structure. Pulmonic valve regurgitation is not visualized. No evidence of pulmonic stenosis. Aorta: Aortic dilatation noted. There is moderate dilatation of the ascending aorta, measuring 44 mm. Venous: The inferior vena cava is dilated in size with less than 50% respiratory variability, suggesting right atrial pressure of 15 mmHg. IAS/Shunts: No atrial level shunt detected by color flow Doppler.  LEFT VENTRICLE PLAX 2D LVIDd:         3.40 cm     Diastology LVIDs:         1.90 cm     LV e' medial:    6.45 cm/s LV PW:         1.80 cm     LV E/e' medial:  13.6 LV IVS:        1.60 cm     LV e' lateral:   6.53 cm/s LVOT diam:     2.50 cm     LV E/e' lateral: 13.4 LV SV:         54 LV SV Index:   22 LVOT Area:     4.91 cm  LV Volumes (MOD) LV vol d, MOD A4C: 39.7 ml LV vol s, MOD A4C: 10.3 ml LV SV MOD A4C:     39.7 ml RIGHT VENTRICLE            IVC RV S prime:     9.15 cm/s  IVC diam: 2.50 cm TAPSE (M-mode): 0.6 cm LEFT ATRIUM             Index        RIGHT ATRIUM           Index LA diam:        5.30 cm 2.17 cm/m   RA Area:     49.80 cm LA Vol (A2C):   57.9 ml 23.76 ml/m  RA Volume:   214.00 ml 87.81 ml/m LA Vol (A4C):   31.5 ml 12.93 ml/m LA Biplane Vol: 39.8 ml 16.33 ml/m  AORTIC VALVE LVOT Vmax:  79.90 cm/s LVOT Vmean:  49.300 cm/s LVOT VTI:    0.110 m  AORTA Ao Root diam: 3.60 cm Ao Asc diam:  4.40 cm MITRAL VALVE                  TRICUSPID VALVE MV Area (PHT): 4.60 cm       TR Peak grad:   78.1 mmHg MV Decel Time: 165 msec       TR Vmax:        442.00 cm/s MR Peak grad:    86.5 mmHg MR Mean grad:    48.0 mmHg    SHUNTS MR Vmax:         465.00 cm/s  Systemic VTI:  0.11 m MR Vmean:        296.0 cm/s   Systemic Diam: 2.50 cm MR PISA:         5.09 cm MR PISA Eff ROA: 31 mm MR PISA Radius:  0.90 cm  MV E velocity: 87.50 cm/s MV A velocity: 65.50 cm/s MV E/A ratio:  1.34 Jenkins Rouge MD Electronically signed by Jenkins Rouge MD Signature Date/Time: 11/25/2021/3:47:55 PM    Final (Updated)    [place] EKG SITE RITE  Result Date: 11/25/2021 If Site Rite image not attached, placement could not be confirmed due to current cardiac rhythm.    Medications:     Scheduled Medications:  Chlorhexidine Gluconate Cloth  6 each Topical Daily   gabapentin  200 mg Oral BID   insulin aspart  0-9 Units Subcutaneous TID WC   sodium chloride flush  10-40 mL Intracatheter Q12H    Infusions:  sodium chloride     DOBUTamine 2.5 mcg/kg/min (11/26/21 1107)   norepinephrine (LEVOPHED) Adult infusion      PRN Medications: Place/Maintain arterial line **AND** sodium chloride, sodium chloride flush    Assessment/Plan   1. End-stage RV failure with long-standing severe pulmonary arterial hypertension => cardiogenic shock: PH postulated to be due to OHS/OSA, CTEPH, and possibly group 1 component with elevated ANA. He never started on sildenafil (had been recommended after seeing [facility] CTEPH program). 11/22 echo showed a severely dilated and dysfunctional RV with D-shaped septum and moderate-severe TR.  Preserved LV function.  Echo this admission with EF 60-65%, severe RV failure with severe RV enlargement, moderate MR, moderate TR, IVC dilated, PASP 98 mmHg. He is minimally active, gets around in a power chair.  Admitted with volume overload, elevated lactate suggestive of cardiogenic shock and creatinine up to 3.2 from baseline 2.2-2.3. BP initially stable and started on milrinone and Lasix gtt.  However, became progressive hypotensive this morning.  Now on dobutamine 2.5.  Minimal UOP, creatinine up to 3.64 with BUN > 100. Now lethargic. Co-ox 61%, CVP 15 this morning.  - Poor prognosis => he met with palliative care yesterday, wanted full scope of care/full code.   - Will need arterial line.  - Continue dobutamine 2.5  and add norepinephrine titrated for MAP > 65.   - Hold Lasix with current hypotension.  - With worsening renal failure, will need to consider CVVH as he wants full scope of care but think that he would not tolerate RRT long-term with severe RV dysfunction and severe PAH.  - Send procalcitonin and blood cultures though think this is more likely cardiogenic shock versus septic.  - Send lactate.  2. AKI on CKD stage 3: Creatinine up to 3.64 with BUN 104.  Minimal UOP.  CVP 14-15.  IVC dilated on echo. Progressive  renal failure in setting of RV failure.   - Will consult nephrology, but think he would tolerate HD poorly.  3. Chronic atrial fibrillation: Chronic.  Warfarin at home, INR markedly high here. Rate is reasonably controlled 90s.  - Holding warfarin.  4. Coagulopathy: INR > 10 (5 yesterday).  No overt bleeding, hgb 13 off co-ox. - Send formal CBC  - Vitamin K 5 mg po x 1 now.  5. Hyponatremia: Mild, Na 130.  6. Elevated LFTs: Mild, likely congestive hepatopathy.   He will transfer to CCU, consulting CCM.  Poor prognosis with end stage RV failure and AKI.  Palliative care has seen, will need ongoing discussions.  Updated his daughter.   CRITICAL CARE Performed by: Loralie Champagne  Total critical care time: 45 minutes  Critical care time was exclusive of separately billable procedures and treating other patients.  Critical care was necessary to treat or prevent imminent or life-threatening deterioration.  Critical care was time spent personally by me on the following activities: development of treatment plan with patient and/or surrogate as well as nursing, discussions with consultants, evaluation of patient's response to treatment, examination of patient, obtaining history from patient or surrogate, ordering and performing treatments and interventions, ordering and review of laboratory studies, ordering and review of radiographic studies, pulse oximetry and re-evaluation of patient's  condition.    Length of Stay: 1  Loralie Champagne, MD  11/26/2021, 12:14 PM  Advanced Heart Failure Team Pager [phone] (M-F; 7a - 5p)  Please contact [facility] for night-coverage after hours (5p -7a ) and weekends on [email or web address]

## 2021-11-26 NOTE — Progress Notes (Addendum)
Pt transferred to [facility] room 9.  All Pt's belongings sent with.  Pt's scooter picked up by security for storage.   ?

## 2021-11-26 NOTE — Significant Event (Signed)
Rapid Response Event Note  ? ?Reason for Call :  ?Need for Levophed gtt ? ?Initial Focused Assessment:  ?He is generally lethargic,  He is able to help turn but is weak.   ?Multiple skin abrasions open and  healing on his face and body.   ?BP 72/40  HR 101  R 28  O2 sat 87-89% on 10L [place] ?Dobutamine infusing ? ? ?Interventions:  ?Labs drawn via PICC ?Levophed started at 49mg, titrated up to 20 mcg ? ?Transported to 2H09 ?ICU staff at bedsided ? ?Plan of Care:  ? ? ? ?Event Summary:  ? ?MD Notified:  Dr MAundra Dubinat bedside ?Call Time: 1210 ?Arrival Time: [id number]?End Time: 1330 ? ?LRaliegh Ip RN ?

## 2021-11-26 NOTE — Consult Note (Signed)
Consepcion [name] ?Admit Date: 12/17/2021 ?11/26/2021 ?Rexene Agent ?Requesting Physician:  Einar Crow MD ? ?Reason for Consult:  AoCKD, AoC R sided CHF exacerbation, pulmonary HTN ?HPI:  ?67M admit 3/2 after presenting with N/V, weakness, fatigue. PMH notable for long standing severe PAH and resultant RV failure, which cardiology feels is end stage. Also with hx/o AFib, DM2.   Eval by AHF during this admission shows AoC exacerbation, hypervolemia.  Pt placed on inotropes, diuresis attempted but little UOP.  Trend in SCr below, showing progressive and accelerating increase in SCr.  Not clear how much UOP he has had.  ? ?Today patient hypotensive, transferred to [facility].  Increasing pressor requirement.  Palliative meeting with patient yesterday, full scope of care is goal of patient.   ? ?Has had mild transaminitis, felt to be congestive hepatopathy.  Here SNa 130, stable, K 3.7.   ? ? ? ?Creatinine, Ser (mg/dL)  ?Date Value  ?11/26/2021 3.64 (H)  ?11/25/2021 3.23 (H)  ?12/05/2021 3.10 (H)  ?09/08/2021 2.20 (H)  ?09/08/2021 2.36 (H)  ?09/02/2021 2.31 (H)  ?08/29/2021 2.37 (H)  ?02/28/2021 1.97 (H)  ?02/16/2021 1.96 (H)  ?02/06/2021 1.95 (H)  ?] ?I/Os: ?I/O last 3 completed shifts: ?In: 317.4 [I.V.:267.4; IV Piggyback:50] ?Out: -   ? ?ROS ?NSAIDS: no exposure ?IV Contrast no exposure ?TMP/SMX no exposure ?Hypotension present currently ? ?Balance of 12 systems is negative w/ exceptions as above ? ?PMH  ?Past Medical History:  ?Diagnosis Date  ? Asthma   ? Atrial fibrillation ([place])   ? CKD (chronic kidney disease) stage 3, GFR 30-59 ml/min (HCC)   ? COPD (chronic obstructive pulmonary disease) ([place])   ? COVID-19   ? Essential hypertension   ? History of DVT (deep vein thrombosis)   ? Lung nodule   ? Nosebleed   ? Reportedly recurrent on Eliquis and Xarelto  ? OSA on CPAP   ? Pulmonary hypertension ([place])   ? Respiratory failure with hypoxia ([place])   ? Type 2 diabetes mellitus ([place])   ? ?PSH  ?Past Surgical History:  ?Procedure  Laterality Date  ? RIGHT HEART CATH Right 2022  ? RIGHT HEART CATH N/A 09/08/2021  ? Procedure: RIGHT HEART CATH;  Surgeon: Jolaine Artist, MD;  Location: [facility];  Service: Cardiovascular;  Laterality: N/A;  ? SHOULDER SURGERY    ? ?FH  ?Family History  ?Problem Relation Age of Onset  ? Diabetes Mother   ? Cancer Mother   ? Diabetes Father   ? High blood pressure Father   ? Diabetes Brother   ? Diabetes Brother   ? ?SH  reports that he has never smoked. He has never used smokeless tobacco. He reports that he does not drink alcohol and does not use drugs. ?Allergies  ?Allergies  ?Allergen Reactions  ? Bacitracin-Neomycin-Polymyxin Itching and Rash  ? Pneumococcal 13-Val Conj Vacc Other (See Comments)  ?  "Locked my shoulder up" ?"Locked my shoulder up"  ? ?Home medications ?Prior to Admission medications   ?Medication Sig Start Date End Date Taking? Authorizing Provider  ?Accu-Chek Softclix Lancets lancets USE TO TEST TWO TIMES DAILY 07/10/20  Yes Alcus Dad, MD  ?acetaminophen (TYLENOL) 650 MG CR tablet Take 1,300 mg by mouth in the morning and at bedtime.   Yes [provider]  ?Alcohol Swabs (B-D SINGLE USE SWABS REGULAR) PADS USE TWO TIMES DAILY 07/10/20  Yes Alcus Dad, MD  ?Blood Glucose Monitoring Suppl (ACCU-CHEK AVIVA PLUS) w/Device KIT TEST TWO TIMES  DAILY 08/06/18  Yes Sherene Sires, DO  ?Colchicine 0.6 MG CAPS Take 0.6 mg by mouth in the morning and at bedtime. 05/26/21  Yes [provider]  ?diclofenac Sodium (VOLTAREN) 1 % GEL Apply 4 g topically 4 (four) times daily as needed (joint pain). 02/06/21  Yes Barton Dubois, MD  ?Dulaglutide (TRULICITY) 3.61 WE/3.1VQ SOPN Inject 0.75 mg into the skin once a week. ?Patient taking differently: Inject 0.75 mg into the skin every Friday. 02/28/21  Yes Darrelyn Hillock N, DO  ?ferrous sulfate 325 (65 FE) MG tablet Take 325 mg by mouth in the morning.   Yes [provider]  ?gabapentin (NEURONTIN) 100 MG capsule TAKE  2 CAPSULES (200MG) TWICE DAILY ?Patient taking differently: 200 mg 2 (two) times daily. 05/27/21  Yes Alcus Dad, MD  ?metolazone (ZAROXOLYN) 2.5 MG tablet Take 2.5 mg by mouth daily as needed (blood pressure 125 and over). 03/14/21  Yes [provider]  ?OXYGEN Inhale 5 L into the lungs continuous.   Yes [provider]  ?oxymetazoline (AFRIN) 0.05 % nasal spray Place 1-2 sprays into both nostrils 2 (two) times daily as needed for congestion.   Yes [provider]  ?Phenylephrine HCl (SINEX REGULAR NA) Place 2 sprays into both nostrils daily as needed (nasal congestion).   Yes [provider]  ?potassium chloride SA (KLOR-CON M) 20 MEQ tablet Take 3 tablets (60 mEq total) by mouth daily. 08/30/21  Yes Bensimhon, Shaune Pascal, MD  ?PRESCRIPTION MEDICATION Inhale into the lungs as needed (whenever sleeping - naps or at night). CPAP   Yes [provider]  ?torsemide (DEMADEX) 20 MG tablet Take 3 tablets (60 mg total) by mouth daily. ?Patient taking differently: Take 60 mg by mouth every evening. 08/16/21  Yes Satira Sark, MD  ?warfarin (COUMADIN) 2.5 MG tablet TAKE 2 TABLETS DAILY EXCEPT 3 TABLETS ON TUESDAYS, THURSDAYS AND SATURDAYS OR AS DIRECTED ?Patient taking differently: Take 5-7.5 mg by mouth See admin instructions. 7.5 mg Tuesday,Thursday,Saturday ?5 mg Monday,Wednesday, Friday,sunday 10/28/21  Yes Satira Sark, MD  ?ACCU-CHEK AVIVA PLUS test strip TEST BLOOD SUGAR TWICE DAILY 02/01/21   Alcus Dad, MD  ?cholecalciferol (VITAMIN D) 1000 units tablet Take 1 tablet (1,000 Units total) by mouth daily. ?Patient not taking: Reported on 11/28/2021 03/08/18   Carlyle Dolly, MD  ? ? ?Current Medications ?Scheduled Meds: ? Chlorhexidine Gluconate Cloth  6 each Topical Daily  ? gabapentin  200 mg Oral BID  ? insulin aspart  0-9 Units Subcutaneous TID WC  ? phytonadione  5 mg Oral Once  ? sodium chloride flush  10-40 mL Intracatheter Q12H  ? ?Continuous  Infusions: ? sodium chloride    ? DOBUTamine 2.5 mcg/kg/min (11/26/21 1107)  ? magnesium sulfate bolus IVPB    ? norepinephrine (LEVOPHED) Adult infusion 5 mcg/min (11/26/21 1244)  ? ?PRN Meds:.Place/Maintain arterial line **AND** sodium chloride, sodium chloride flush ? ?CBC ?Recent Labs  ?Lab 11/28/2021 ?1347 11/25/21 ?0086 11/26/21 ?1247  ?WBC 6.9 6.7 6.8  ?NEUTROABS  --  5.0  --   ?HGB 15.2 15.2 11.9*  ?HCT 44.8 45.3 36.0*  ?MCV 92.8 93.8 93.0  ?PLT 115* 100* 97*  ? ?Basic Metabolic Panel ?Recent Labs  ?Lab 11/26/2021 ?1347 11/25/21 ?7619 11/26/21 ?0505  ?NA 129* 127* 130*  ?K 3.5 3.9 3.7  ?CL 88* 89* 86*  ?CO2 23 19* 24  ?GLUCOSE 99 106* 125*  ?BUN 86* 93* 104*  ?CREATININE 3.10* 3.23* 3.64*  ?CALCIUM 9.0 8.6* 8.4*  ?  PHOS  --   --  4.6  ? ? ?Physical Exam   ?Blood pressure (!) 99/54, pulse 98, temperature 97.8 ?F (36.6 ?C), temperature source Oral, resp. rate 20, weight 133.8 kg, SpO2 94 %. ?GEN: ill appearing ?ENT: NCAT ?EYES: EOMI ?CV: Regular  ?PULM: Coarse bs b/l ?ABD: hypoactive, protuberant ?SKIN: 1+ LEE, chronic venous changes ?Nonfocal, moves all extremites ? ? ?Assessment ?87M progressive AKI 2/2 cardiorenal syndrome failure, acute decompensated RV failure in shock ? ?AKI, cardiorenal 2/2 #2 ?Decompensated RV failure 2/2 severe end stage pulmonary hypertension; cardiogenic shock ?Congestive hepatopathy ?Mild hyponatremia 2/2 #2 ? ?Plan ?Given the severity and irreversibility of his heart failure he would not tolerate outpatient hemodialysis.  Nor do I think that CRRT should be recommended given the lack of a long-term plan and unlikely improvement with use.  There is real risk of harming him with dialysis.  I have recommended the patient that no dialysis be performed.  This is something he wants to consider, we will continue to discuss. ? ? ?Rexene Agent  ?11/26/2021, 1:07 PM ? ? ?  ?

## 2021-11-26 NOTE — Progress Notes (Signed)
Patient unsure if he can tolerate rmask from CPAP, patient currently on 5L nasal cannula tolerating well. RT will continue to monitor patient.  ?

## 2021-11-26 NOTE — Consult Note (Signed)
NAME:  Roberto [name], MRN:  [id number], DOB:  October 04, 1960, LOS: 1 ADMISSION DATE:  12/20/2021, CONSULTATION DATE:  11/26/21 REFERRING MD:  CHF team, CHIEF COMPLAINT:  FTT   History of Present Illness:  61 year old man with hx of end stage multifactorial cor pulmonale (chronic hypoxemia, OSA on CPAP, OHS, PE, mitral regurgitation) who presented with weakness, diarrhea, failure to thrive found to be in decompensated RV failure.  Attempted to diurese with inotropes and lasix but unfortunately not much progress, worsening BUN/Cr, and Bps are dropping.  Lengthy conversations with family by CHF team but patient would like continued aggressive care.  He is currently lethargic and unable to provide much history.  To be transported to CCU with PCCM taking over as primary.  Pertinent  Medical History  CKD3 COPD HTN DVT/PE on warfarin OSA on CPAP DM2 Cor pulmonale  Significant Hospital Events: Including procedures, antibiotic start and stop dates in addition to other pertinent events   3/3 admitted 3/4 deterioration  Interim History / Subjective:  Somnolent  Objective   Blood pressure (!) 99/54, pulse 98, temperature 97.8 F (36.6 C), temperature source Oral, resp. rate 20, weight 133.8 kg, SpO2 94 %. CVP:  [10 mmHg-20 mmHg] 12 mmHg      Intake/Output Summary (Last 24 hours) at 11/26/2021 1244 Last data filed at 11/25/2021 2337 Gross per 24 hour  Intake 267.44 ml  Output --  Net 267.44 ml   Filed Weights   11/25/21 1300  Weight: 133.8 kg    Examination: General: ill appearing man laying in bed HENT: MM dry, malampatti 4 Lungs: Crackles bases, poor inspiratory effort Cardiovascular: + displaced hyperdynamic PMI, ext slightly mottled Abdomen: protuberant, hypoactive BS Extremities: chronic venous stasis changes with 1+ pitting edema superimposed Neuro: moves all 4 ext to command Psych: RASS -1  Coox 60% BUN 104, Cr 3.6 Sodium 130 AST 86 ALT 63 Lactate 2.4 CXR pulmonary  edema, cardiomegaly question retrocardiac infiltrate CT head benign  Resolved Hospital Problem list   N/A  Assessment & Plan:  Acute decompensated multifactorial cor pulmonale with cardiogenic shock, cardiorenal syndrome.  Severe pulm HTN PVR 6.6 with history details extensively by Dr. Christa See at [facility] on 10/12/21  Shock liver, coagulopathy related to warfarin use and shock liver  T2DM, OSA on CPAP, HTN, COPD (although PFTs relatively preserved 1/18), DVT/PE, CKD, chronic AF (hx of cardioversion)   - ICU transfer - Levophed titrated to MAP 65 - Inotropes and pulmonary vasodilators per CHF team - Nephrology to see.  Not long term HD candidate, unclear if we are offering short trial of CRRT, defer to CHF and nephrology discussions - Warfarin on hold, getting vitamin K - Intubation would be extremely dangerous with his cardiac status, would try to avoid at all costs  Best Practice (right click and "Reselect all SmartList Selections" daily)   Diet/type: NPO DVT prophylaxis: Coumadin GI prophylaxis: N/A Lines: Central line Foley:  Yes, and it is still needed Code Status:  full code Last date of multidisciplinary goals of care discussion [3/3 continue current level of care]  Labs   CBC: Recent Labs  Lab 12/15/2021 1347 11/25/21 0536  WBC 6.9 6.7  NEUTROABS  --  5.0  HGB 15.2 15.2  HCT 44.8 45.3  MCV 92.8 93.8  PLT 115* 100*    Basic Metabolic Panel: Recent Labs  Lab 12/20/2021 1347 12/15/2021 2152 11/25/21 0536 11/26/21 0505  NA 129*  --  127* 130*  K 3.5  --  3.9 3.7  CL 88*  --  89* 86*  CO2 23  --  19* 24  GLUCOSE 99  --  106* 125*  BUN 86*  --  93* 104*  CREATININE 3.10*  --  3.23* 3.64*  CALCIUM 9.0  --  8.6* 8.4*  MG  --  1.2* 1.3* 1.6*  PHOS  --   --   --  4.6   GFR: Estimated Creatinine Clearance: 28.9 mL/min (A) (by C-G formula based on SCr of 3.64 mg/dL (H)). Recent Labs  Lab 12/18/2021 1347 12/14/2021 2314 11/25/21 0536  WBC 6.9  --  6.7  LATICACIDVEN   --  2.1* 2.4*    Liver Function Tests: Recent Labs  Lab 12/09/2021 2152 11/25/21 0536 11/26/21 0505  AST 109* 95* 86*  ALT 73* 67* 63*  ALKPHOS 119 105 100  BILITOT 4.0* 4.5* 4.7*  PROT 7.3 6.5 6.0*  ALBUMIN 3.9 3.5 3.0*   Recent Labs  Lab 11/23/2021 2152  LIPASE 28   Recent Labs  Lab 12/12/2021 2154  AMMONIA 47*    ABG    Component Value Date/Time   HCO3 28.2 (H) 09/08/2021 1104   TCO2 33 (H) 09/08/2021 1132   O2SAT 60.5 11/26/2021 0505     Coagulation Profile: Recent Labs  Lab 12/02/2021 2152 11/26/21 1106  INR 5.3* >10.0*    Cardiac Enzymes: No results for input(s): CKTOTAL, CKMB, CKMBINDEX, TROPONINI in the last 168 hours.  HbA1C: HbA1c, POC (controlled diabetic range)  Date/Time Value Ref Range Status  06/30/2020 03:19 PM 7.4 (A) 0.0 - 7.0 % Final  10/08/2019 04:10 PM 7.8 (A) 0.0 - 7.0 % Final   Hgb A1c MFr Bld  Date/Time Value Ref Range Status  11/25/2021 05:36 AM 5.5 4.8 - 5.6 % Final    Comment:    (NOTE) Pre diabetes:          5.7%-6.4%  Diabetes:              >6.4%  Glycemic control for   <7.0% adults with diabetes   02/02/2021 08:32 PM 8.7 (H) 4.8 - 5.6 % Final    Comment:    (NOTE) Pre diabetes:          5.7%-6.4%  Diabetes:              >6.4%  Glycemic control for   <7.0% adults with diabetes     CBG: Recent Labs  Lab 11/25/21 0815 11/25/21 1253 11/25/21 1717 11/26/21 1226  GLUCAP 103* 107* 136* 113*    Review of Systems:   Limited due to somnolence Denies pain Denies breathing trouble +fatigue, malaise, anorxia, LE and abdominal bloating  Past Medical History:  He,  has a past medical history of Asthma, Atrial fibrillation ([place]), CKD (chronic kidney disease) stage 3, GFR 30-59 ml/min (HCC), COPD (chronic obstructive pulmonary disease) ([place]), COVID-19, Essential hypertension, History of DVT (deep vein thrombosis), Lung nodule, Nosebleed, OSA on CPAP, Pulmonary hypertension ([place]), Respiratory failure with hypoxia ([place]),  and Type 2 diabetes mellitus ([place]).   Surgical History:   Past Surgical History:  Procedure Laterality Date   RIGHT HEART CATH Right 2022   RIGHT HEART CATH N/A 09/08/2021   Procedure: RIGHT HEART CATH;  Surgeon: Jolaine Artist, MD;  Location: [facility];  Service: Cardiovascular;  Laterality: N/A;   SHOULDER SURGERY       Social History:   reports that he has never smoked. He has never used smokeless tobacco. He reports that he does not drink  alcohol and does not use drugs.   Family History:  His family history includes Cancer in his mother; Diabetes in his brother, brother, father, and mother; High blood pressure in his father.   Allergies Allergies  Allergen Reactions   Bacitracin-Neomycin-Polymyxin Itching and Rash   Pneumococcal 13-Val Conj Vacc Other (See Comments)    "Locked my shoulder up" "Locked my shoulder up"     Home Medications  Prior to Admission medications   Medication Sig Start Date End Date Taking? Authorizing Provider  Accu-Chek Softclix Lancets lancets USE TO TEST TWO TIMES DAILY 07/10/20  Yes Alcus Dad, MD  acetaminophen (TYLENOL) 650 MG CR tablet Take 1,300 mg by mouth in the morning and at bedtime.   Yes [provider]  Alcohol Swabs (B-D SINGLE USE SWABS REGULAR) PADS USE TWO TIMES DAILY 07/10/20  Yes Alcus Dad, MD  Blood Glucose Monitoring Suppl (ACCU-CHEK AVIVA PLUS) w/Device KIT TEST TWO TIMES DAILY 08/06/18  Yes Sherene Sires, DO  Colchicine 0.6 MG CAPS Take 0.6 mg by mouth in the morning and at bedtime. 05/26/21  Yes [provider]  diclofenac Sodium (VOLTAREN) 1 % GEL Apply 4 g topically 4 (four) times daily as needed (joint pain). 02/06/21  Yes Barton Dubois, MD  Dulaglutide (TRULICITY) 6.72 CN/4.7SJ SOPN Inject 0.75 mg into the skin once a week. Patient taking differently: Inject 0.75 mg into the skin every Friday. 02/28/21  Yes Darrelyn Hillock N, DO  ferrous sulfate 325 (65 FE) MG tablet Take 325 mg by  mouth in the morning.   Yes [provider]  gabapentin (NEURONTIN) 100 MG capsule TAKE 2 CAPSULES (200MG) TWICE DAILY Patient taking differently: 200 mg 2 (two) times daily. 05/27/21  Yes Alcus Dad, MD  metolazone (ZAROXOLYN) 2.5 MG tablet Take 2.5 mg by mouth daily as needed (blood pressure 125 and over). 03/14/21  Yes [provider]  OXYGEN Inhale 5 L into the lungs continuous.   Yes [provider]  oxymetazoline (AFRIN) 0.05 % nasal spray Place 1-2 sprays into both nostrils 2 (two) times daily as needed for congestion.   Yes [provider]  Phenylephrine HCl (SINEX REGULAR NA) Place 2 sprays into both nostrils daily as needed (nasal congestion).   Yes [provider]  potassium chloride SA (KLOR-CON M) 20 MEQ tablet Take 3 tablets (60 mEq total) by mouth daily. 08/30/21  Yes Bensimhon, Shaune Pascal, MD  PRESCRIPTION MEDICATION Inhale into the lungs as needed (whenever sleeping - naps or at night). CPAP   Yes [provider]  torsemide (DEMADEX) 20 MG tablet Take 3 tablets (60 mg total) by mouth daily. Patient taking differently: Take 60 mg by mouth every evening. 08/16/21  Yes Satira Sark, MD  warfarin (COUMADIN) 2.5 MG tablet TAKE 2 TABLETS DAILY EXCEPT 3 TABLETS ON TUESDAYS, THURSDAYS AND SATURDAYS OR AS DIRECTED Patient taking differently: Take 5-7.5 mg by mouth See admin instructions. 7.5 mg Tuesday,Thursday,Saturday 5 mg Monday,Wednesday, Friday,sunday 10/28/21  Yes Satira Sark, MD  ACCU-CHEK AVIVA PLUS test strip TEST BLOOD SUGAR TWICE DAILY 02/01/21   Alcus Dad, MD  cholecalciferol (VITAMIN D) 1000 units tablet Take 1 tablet (1,000 Units total) by mouth daily. Patient not taking: Reported on 11/25/2021 03/08/18   Carlyle Dolly, MD     Critical care time: 35 minutes

## 2021-11-26 NOTE — Procedures (Signed)
Arterial Catheter Insertion Procedure Note ? ?Xiong Haidar  ?[id number]  ?09-Oct-1960 ? ?Date:11/26/21  ?Time:2:52 PM  ? ? ?Provider Performing: Ander Purpura  ? ? ?Procedure: Insertion of Arterial Line [phone]) without US guidance ? ?Indication(s) ?Blood pressure monitoring and/or need for frequent ABGs ? ?Consent ?Unable to obtain consent due to emergent nature of procedure. ? ?Anesthesia ?None ? ? ?Time Out ?Verified patient identification, verified procedure, site/side was marked, verified correct patient position, special equipment/implants available, medications/allergies/relevant history reviewed, required imaging and test results available. ? ? ?Sterile Technique ?Maximal sterile technique including full sterile barrier drape, hand hygiene, sterile gown, sterile gloves, mask, hair covering, sterile ultrasound probe cover (if used). ? ? ?Procedure Description ?Area of catheter insertion was cleaned with chlorhexidine and draped in sterile fashion. Without real-time ultrasound guidance an arterial catheter was placed into the left radial artery.  Appropriate arterial tracings confirmed on monitor.   ? ? ?Complications/Tolerance ?None; patient tolerated the procedure well. ? ? ?EBL ?Minimal ? ? ?Specimen(s) ?None ? ?

## 2021-11-27 DIAGNOSIS — R570 Cardiogenic shock: Secondary | ICD-10-CM | POA: Diagnosis not present

## 2021-11-27 DIAGNOSIS — I50810 Right heart failure, unspecified: Secondary | ICD-10-CM | POA: Diagnosis not present

## 2021-11-27 DIAGNOSIS — R112 Nausea with vomiting, unspecified: Secondary | ICD-10-CM | POA: Diagnosis not present

## 2021-11-27 DIAGNOSIS — R197 Diarrhea, unspecified: Secondary | ICD-10-CM | POA: Diagnosis not present

## 2021-11-27 DIAGNOSIS — N179 Acute kidney failure, unspecified: Principal | ICD-10-CM

## 2021-11-27 DIAGNOSIS — Z515 Encounter for palliative care: Secondary | ICD-10-CM

## 2021-11-27 LAB — COMPREHENSIVE METABOLIC PANEL
ALT: 58 U/L — ABNORMAL HIGH (ref 0–44)
AST: 74 U/L — ABNORMAL HIGH (ref 15–41)
Albumin: 2.8 g/dL — ABNORMAL LOW (ref 3.5–5.0)
Alkaline Phosphatase: 97 U/L (ref 38–126)
Anion gap: 18 — ABNORMAL HIGH (ref 5–15)
BUN: 108 mg/dL — ABNORMAL HIGH (ref 8–23)
CO2: 21 mmol/L — ABNORMAL LOW (ref 22–32)
Calcium: 8 mg/dL — ABNORMAL LOW (ref 8.9–10.3)
Chloride: 85 mmol/L — ABNORMAL LOW (ref 98–111)
Creatinine, Ser: 3.76 mg/dL — ABNORMAL HIGH (ref 0.61–1.24)
GFR, Estimated: 17 mL/min — ABNORMAL LOW (ref >60)
Glucose, Bld: 240 mg/dL — ABNORMAL HIGH (ref 70–99)
Potassium: 3.6 mmol/L (ref 3.5–5.1)
Sodium: 124 mmol/L — ABNORMAL LOW (ref 135–145)
Total Bilirubin: 5.7 mg/dL — ABNORMAL HIGH (ref 0.3–1.2)
Total Protein: 5.8 g/dL — ABNORMAL LOW (ref 6.5–8.1)

## 2021-11-27 LAB — CBC
HCT: 36.5 % — ABNORMAL LOW (ref 39.0–52.0)
Hemoglobin: 12.2 g/dL — ABNORMAL LOW (ref 13.0–17.0)
MCH: 30.7 pg (ref 26.0–34.0)
MCHC: 33.4 g/dL (ref 30.0–36.0)
MCV: 91.7 fL (ref 80.0–100.0)
Platelets: 106 10*3/uL — ABNORMAL LOW (ref 150–400)
RBC: 3.98 MIL/uL — ABNORMAL LOW (ref 4.22–5.81)
RDW: 19.2 % — ABNORMAL HIGH (ref 11.5–15.5)
WBC: 8.2 10*3/uL (ref 4.0–10.5)
nRBC: 1.1 % — ABNORMAL HIGH (ref 0.0–0.2)

## 2021-11-27 LAB — GLUCOSE, CAPILLARY
Glucose-Capillary: 159 mg/dL — ABNORMAL HIGH (ref 70–99)
Glucose-Capillary: 166 mg/dL — ABNORMAL HIGH (ref 70–99)
Glucose-Capillary: 131 mg/dL — ABNORMAL HIGH (ref 70–99)

## 2021-11-27 LAB — COOXEMETRY PANEL
Carboxyhemoglobin: 1.1 % (ref 0.5–1.5)
Methemoglobin: <0.7 % (ref 0.0–1.5)
O2 Saturation: 66.2 %
Total hemoglobin: 12.6 g/dL (ref 12.0–16.0)

## 2021-11-27 LAB — PROTIME-INR
INR: 9.4 (ref 0.8–1.2)
Prothrombin Time: 75.8 seconds — ABNORMAL HIGH (ref 11.4–15.2)

## 2021-11-27 LAB — MAGNESIUM: Magnesium: 2 mg/dL (ref 1.7–2.4)

## 2021-11-27 MED ORDER — MORPHINE 100MG IN NS 100ML (1MG/ML) PREMIX INFUSION
0.0000 mg/h | INTRAVENOUS | Status: DC
  Administered 2021-11-27: 5 mg/h via INTRAVENOUS
  Filled 2021-11-27: qty 100

## 2021-11-27 MED ORDER — MORPHINE BOLUS VIA INFUSION
5.0000 mg | INTRAVENOUS | Status: DC | PRN
  Administered 2021-11-27 (×4): 5 mg via INTRAVENOUS
  Filled 2021-11-27: qty 5

## 2021-11-27 MED ORDER — ACETAMINOPHEN 650 MG RE SUPP: 650.0000 mg | Freq: Four times a day (QID) | RECTAL | Status: DC | PRN

## 2021-11-27 MED ORDER — HALOPERIDOL LACTATE 5 MG/ML IJ SOLN
INTRAMUSCULAR | Status: AC
  Administered 2021-11-27: 5 mg via INTRAVENOUS
  Filled 2021-11-27: qty 1

## 2021-11-27 MED ORDER — MORPHINE SULFATE (PF) 2 MG/ML IV SOLN
2.0000 mg | INTRAVENOUS | Status: DC | PRN
  Administered 2021-11-27: 2 mg via INTRAVENOUS
  Filled 2021-11-27: qty 1

## 2021-11-27 MED ORDER — DIPHENHYDRAMINE HCL 50 MG/ML IJ SOLN: 25.0000 mg | INTRAMUSCULAR | Status: DC | PRN

## 2021-11-27 MED ORDER — GLYCOPYRROLATE 0.2 MG/ML IJ SOLN: 0.2000 mg | INTRAMUSCULAR | Status: DC | PRN

## 2021-11-27 MED ORDER — PHYTONADIONE 5 MG PO TABS
10.0000 mg | ORAL_TABLET | ORAL | Status: AC
  Administered 2021-11-27: 10 mg via ORAL
  Filled 2021-11-27: qty 2

## 2021-11-27 MED ORDER — ACETAMINOPHEN 325 MG PO TABS: 650.0000 mg | ORAL_TABLET | Freq: Four times a day (QID) | ORAL | Status: DC | PRN

## 2021-11-27 MED ORDER — DEXMEDETOMIDINE HCL IN NACL 400 MCG/100ML IV SOLN
0.4000 ug/kg/h | INTRAVENOUS | Status: DC
  Administered 2021-11-27 (×2): 0.5 ug/kg/h via INTRAVENOUS
  Administered 2021-11-27: 1.2 ug/kg/h via INTRAVENOUS
  Filled 2021-11-27 (×3): qty 100

## 2021-11-27 MED ORDER — GLYCOPYRROLATE 1 MG PO TABS
1.0000 mg | ORAL_TABLET | ORAL | Status: DC | PRN
Start: 2021-11-27 — End: 2021-11-28
  Filled 2021-11-27: qty 1

## 2021-11-27 MED ORDER — HALOPERIDOL LACTATE 5 MG/ML IJ SOLN: 5.0000 mg | Freq: Once | INTRAMUSCULAR | Status: AC

## 2021-11-27 MED ORDER — HALOPERIDOL LACTATE 5 MG/ML IJ SOLN
2.5000 mg | INTRAMUSCULAR | Status: DC | PRN
  Administered 2021-11-27: 5 mg via INTRAVENOUS
  Filled 2021-11-27: qty 1

## 2021-11-27 MED ORDER — DEXTROSE 5 % IV SOLN
120.0000 mg | Freq: Once | INTRAVENOUS | Status: DC
Start: 2021-11-27 — End: 2021-11-27
  Filled 2021-11-27: qty 12

## 2021-11-27 MED ORDER — LORAZEPAM 2 MG/ML IJ SOLN
2.0000 mg | INTRAMUSCULAR | Status: DC | PRN
  Administered 2021-11-27: 2 mg via INTRAVENOUS
  Filled 2021-11-27: qty 1

## 2021-11-27 MED ORDER — POLYVINYL ALCOHOL 1.4 % OP SOLN
1.0000 [drp] | Freq: Four times a day (QID) | OPHTHALMIC | Status: DC | PRN
  Filled 2021-11-27: qty 15

## 2021-11-27 MED ORDER — FUROSEMIDE 10 MG/ML IJ SOLN
120.0000 mg | Freq: Once | INTRAMUSCULAR | Status: AC
  Administered 2021-11-27: 120 mg via INTRAVENOUS
  Filled 2021-11-27: qty 12

## 2021-11-27 MED ORDER — GLYCOPYRROLATE 0.2 MG/ML IJ SOLN
0.2000 mg | INTRAMUSCULAR | Status: DC | PRN
  Administered 2021-11-27: 0.2 mg via INTRAVENOUS
  Filled 2021-11-27: qty 1

## 2021-11-29 ENCOUNTER — Encounter (HOSPITAL_COMMUNITY): Payer: Medicare HMO | Admitting: Internal Medicine

## 2021-12-01 ENCOUNTER — Ambulatory Visit: Payer: Medicare HMO | Admitting: Internal Medicine

## 2021-12-01 LAB — CULTURE, BLOOD (ROUTINE X 2)
Culture: NO GROWTH
Culture: NO GROWTH

## 2021-12-24 NOTE — Progress Notes (Signed)
Brief Palliative Medicine Progress Note: ? ?Chart review performed - unfortunately, patient has continued to decline despite full aggressive medical interventions. Noted [place] meeting was held with patient's family earlier today and plan is for transition to full comfort care this afternoon once family arrives. ? ?Spoke with Eliseo Gum, NP who feels there are no PMT needs at this time, but will reach out if any needs do arise.  ? ?PMT will continue to follow peripherally. If there are any imminent needs please call the service directly. ? ?Thank you for allowing PMT to assist in the care of this patient. ? ?[employer] Tamala Julian, FNP-BC ?Palliative Medicine Team ?Team Phone: [phone] ?NO CHARGE ? ?

## 2021-12-24 NOTE — Progress Notes (Signed)
Pt placed on [place] per MD order. Pt placed on 25L / 80%, Sp02 96%. Pt tolerating well at this time ?

## 2021-12-24 NOTE — Progress Notes (Signed)
? ? Advanced Heart Failure Rounding Note ? ?PCP-Cardiologist: Rozann Lesches, MD  ? ?Subjective:   ? ?UOP 779 cc over the last day.  He is currently on dobutamine 2.5, NE 40, vasopressin 0.03. Co-ox 66%.  CVP > 20.  ? ?BUN up to 108 with creatinine 3.23=>3.64 => 3.76. Afebrile, normal WBCs.  ? ?He is confused and lethargic this morning.  ? ?Echo: EF 60-65%, severe RV failure with severe RV enlargement, moderate MR, moderate TR, IVC dilated, PASP 98 mmHg.  ? ? ?Objective:   ?Weight Range: ?132.9 kg ?Body mass index is 43.27 kg/m?.  ? ?Vital Signs:   ?Temp:  [98 ?F (36.7 ?C)-98.3 ?F (36.8 ?C)] 98.3 ?F (36.8 ?C) (03/05 0725) ?Pulse Rate:  [72-118] 93 (03/05 0630) ?Resp:  [15-32] 15 (03/05 0600) ?BP: (53-112)/(38-86) 90/74 (03/05 0600) ?SpO2:  [89 %-100 %] 95 % (03/05 1000) ?Arterial Line BP: (73-144)/(37-99) 117/70 (03/05 0630) ?FiO2 (%):  [80 %] 80 % (03/05 0800) ?Weight:  [132.9 kg-165 kg] 132.9 kg (03/05 0500) ?Last BM Date : 11/26/21 ? ?Weight change: ?Filed Weights  ? 11/26/21 1400 11/26/21 2230 2021/12/11 0500  ?Weight: (S) (!) 165 kg 132.9 kg 132.9 kg  ? ? ?Intake/Output:  ? ?Intake/Output Summary (Last 24 hours) at 2021/12/11 1008 ?Last data filed at 2021/12/11 0444 ?Gross per 24 hour  ?Intake 1892.08 ml  ?Output 779 ml  ?Net 1113.08 ml  ?  ? ? ?Physical Exam  ?  ?General: Drowsy, confused ?Neck: JVP 16+, no thyromegaly or thyroid nodule.  ?Lungs: Decreased at bases.  ?CV: Nondisplaced PMI.  Heart regular S1/S2, no S3/S4, no murmur.  1+ ankle edema.  ?Abdomen: Soft, nontender, no hepatosplenomegaly, no distention.  ?Skin: Intact without lesions or rashes.  ?Neurologic:Confused, drowsy ?Extremities: No clubbing or cyanosis.  ?HEENT: Normal.  ? ? ?Telemetry  ? ?Atrial fibrillation 90s, personally reviewed ? ?Labs  ?  ?CBC ?Recent Labs  ?  11/25/21 ?3009 11/26/21 ?1247 11/26/21 ?2252 2021-12-11 ?0445  ?WBC 6.7 6.8  --  8.2  ?NEUTROABS 5.0  --   --   --   ?HGB 15.2 11.9* 13.9 12.2*  ?HCT 45.3 36.0* 41.0 36.5*  ?MCV 93.8  93.0  --  91.7  ?PLT 100* 97*  --  106*  ? ?Basic Metabolic Panel ?Recent Labs  ?  11/26/21 ?0505 11/26/21 ?2252 December 11, 2021 ?0445  ?NA 130* 122* 124*  ?K 3.7 3.9 3.6  ?CL 86*  --  85*  ?CO2 24  --  21*  ?GLUCOSE 125*  --  240*  ?BUN 104*  --  108*  ?CREATININE 3.64*  --  3.76*  ?CALCIUM 8.4*  --  8.0*  ?MG 1.6*  --  2.0  ?PHOS 4.6  --   --   ? ?Liver Function Tests ?Recent Labs  ?  11/26/21 ?0505 12/11/21 ?0445  ?AST 86* 74*  ?ALT 63* 58*  ?ALKPHOS 100 97  ?BILITOT 4.7* 5.7*  ?PROT 6.0* 5.8*  ?ALBUMIN 3.0* 2.8*  ? ?Recent Labs  ?  11/30/2021 ?2152  ?LIPASE 28  ? ?Cardiac Enzymes ?No results for input(s): CKTOTAL, CKMB, CKMBINDEX, TROPONINI in the last 72 hours. ? ?BNP: ?BNP (last 3 results) ?Recent Labs  ?  02/02/21 ?1730 08/29/21 ?1522 12/08/2021 ?2314  ?BNP 458.0* 487.1* 413.8*  ? ? ?ProBNP (last 3 results) ?No results for input(s): PROBNP in the last 8760 hours. ? ? ?D-Dimer ?No results for input(s): DDIMER in the last 72 hours. ?Hemoglobin A1C ?Recent Labs  ?  11/25/21 ?2330  ?HGBA1C  5.5  ? ?Fasting Lipid Panel ?No results for input(s): CHOL, HDL, LDLCALC, TRIG, CHOLHDL, LDLDIRECT in the last 72 hours. ?Thyroid Function Tests ?No results for input(s): TSH, T4TOTAL, T3FREE, THYROIDAB in the last 72 hours. ? ?Invalid input(s): FREET3 ? ?Other results: ? ? ?Imaging  ? ? ?No results found. ? ? ?Medications:   ? ? ?Scheduled Medications: ? Chlorhexidine Gluconate Cloth  6 each Topical Daily  ? gabapentin  200 mg Oral BID  ? haloperidol lactate      ? haloperidol lactate  5 mg Intravenous Once  ? insulin aspart  0-9 Units Subcutaneous TID WC  ? sodium chloride flush  10-40 mL Intracatheter Q12H  ? ? ?Infusions: ? sodium chloride    ? dexmedetomidine (PRECEDEX) IV infusion    ? DOBUTamine 2.5 mcg/kg/min (December 21, 2021 0400)  ? furosemide    ? norepinephrine (LEVOPHED) Adult infusion 40 mcg/min (12/21/2021 0819)  ? vasopressin 0.03 Units/min (12-21-2021 0701)  ? ? ?PRN Medications: ?Place/Maintain arterial line **AND** sodium  chloride, sodium chloride flush ? ? ? ?Assessment/Plan  ? ?1. End-stage RV failure with long-standing severe pulmonary arterial hypertension => cardiogenic shock: PH postulated to be due to OHS/OSA, CTEPH, and possibly group 1 component with elevated ANA. He never started on sildenafil (had been recommended after seeing [facility] CTEPH program). 11/22 echo showed a severely dilated and dysfunctional RV with D-shaped septum and moderate-severe TR.  Preserved LV function.  Echo this admission with EF 60-65%, severe RV failure with severe RV enlargement, moderate MR, moderate TR, IVC dilated, PASP 98 mmHg. He is minimally active, gets around in a power chair.  Admitted with volume overload, elevated lactate suggestive of cardiogenic shock and creatinine up to 3.2 from baseline 2.2-2.3. BP initially stable and started on milrinone and Lasix gtt.  However, became progressive hypotensive on 3/4.  Now in CCU on dobutamine 2.5, NE 40, vasopressin 0.03.   Poor UOP, creatinine up to 3.76 with BUN > 100. Now lethargic and confused. Co-ox 66%, CVP >20 this morning.  ?- Very poor prognosis with end stage RV failure, not candidate for any form of HD.  Currently DNI, family meeting later today. Would recommend hospice/comfort care at this point.   ?- Has lost arterial line, will not replace.  ?- Continue current pressors/dobutamine titrated for MAP > 65.   ?- Will challenge with Lasix 120 mg IV x 1.  ?- PCT was 6.1 though afebrile with normal WBCs.  If family opts for further aggressive care, will need abx.  ?2. AKI on CKD stage 3: Creatinine up to 3.76 with BUN 108.  Minimal UOP.  CVP >20.  IVC dilated on echo. Progressive renal failure in setting of RV failure.   ?- Seen by nephrology, not candidate for any form of HD.  ?3. Chronic atrial fibrillation: Chronic.  Warfarin at home, INR markedly high here. Rate is reasonably controlled 90s.  ?- Holding warfarin.  ?4. Coagulopathy: INR coming down at 9.4, had vitamin K yesterday.  No  overt bleeding, hgb 12.2.  ?5. Hyponatremia: Na 124. ?- Fluid restrict ?- Not HD candidate.  ?6. Elevated LFTs: Mild, likely congestive hepatopathy.  ? ?Will need further discussions with family today, think comfort care appropriate.  ? ?CRITICAL CARE ?Performed by: Loralie Champagne ? ?Total critical care time: 40 minutes ? ?Critical care time was exclusive of separately billable procedures and treating other patients. ? ?Critical care was necessary to treat or prevent imminent or life-threatening deterioration. ? ?Critical care was time spent personally  by me on the following activities: development of treatment plan with patient and/or surrogate as well as nursing, discussions with consultants, evaluation of patient's response to treatment, examination of patient, obtaining history from patient or surrogate, ordering and performing treatments and interventions, ordering and review of laboratory studies, ordering and review of radiographic studies, pulse oximetry and re-evaluation of patient's condition. ? ?  ?Length of Stay: 2 ? ?Loralie Champagne, MD  ?12-24-21, 10:08 AM ? ?Advanced Heart Failure Team ?Pager [phone] (M-F; 7a - 5p)  ?Please contact [facility] for night-coverage after hours (5p -7a ) and weekends on [email or web address] ?  ?

## 2021-12-24 NOTE — Death Summary Note (Signed)
DEATH SUMMARY   Patient Details  Name: Roberto [name] MRN: [id number] DOB: Oct 25, 1960  Admission/Discharge Information   Admit Date:  2021/12/21  Date of Death: Date of Death: 12-24-21  Time of Death: Time of Death: 2118-11-26  Length of Stay: 2  Referring Physician: Wilburt Finlay, MD   Reason(s) for Hospitalization  End stage cor pulmonale Acute on chronic hypoxemic respiratory failure Acute cardiorenal syndrome on top of CKD3 COPD HTN DVT/PE on warfarin Chronic afib Congestive hepatitis OSA on CPAP DM2   Brief Hospital Course (including significant findings, care, treatment, and services provided and events leading to death)  61 year old man with hx of end stage multifactorial cor pulmonale (chronic hypoxemia, OSA on CPAP, OHS, PE, mitral regurgitation) who presented with weakness, diarrhea, failure to thrive found to be in decompensated RV failure.   Attempted to diurese with inotropes and lasix but unfortunately not much progress, worsening BUN/Cr, and Bps are dropping.   After family meetings regarding Mr. Ashmead' wishes at end of life, decision was made to not pursue further aggressive therapy.  He was transitioned to comfort-based care and passed peacefully with family at bedside.  Pertinent Labs and Studies  Significant Diagnostic Studies CT HEAD WO CONTRAST (5MM)  Result Date: 11/25/2021 CLINICAL DATA:  Increasing weakness EXAM: CT HEAD WITHOUT CONTRAST TECHNIQUE: Contiguous axial images were obtained from the base of the skull through the vertex without intravenous contrast. RADIATION DOSE REDUCTION: This exam was performed according to the departmental dose-optimization program which includes automated exposure control, adjustment of the mA and/or kV according to patient size and/or use of iterative reconstruction technique. COMPARISON:  None. FINDINGS: Brain: There is no evidence of acute intracranial hemorrhage, extra-axial fluid collection, or acute infarct Background  parenchymal volume is normal. The ventricles are normal in size. Gray-white differentiation is preserved. There is no mass lesion. There is no midline shift. Vascular: No hyperdense vessel or unexpected calcification. Skull: Normal. Negative for fracture or focal lesion. Sinuses/Orbits: There is mild mucosal thickening in the paranasal sinuses. The globes and orbits are unremarkable. Other: None. IMPRESSION: No acute intracranial pathology. Electronically Signed   By: Valetta Mole M.D.   On: 11/25/2021 08:14   MR LUMBAR SPINE WO CONTRAST  Result Date: 11/25/2021 CLINICAL DATA:  61 year old male with weakness, myelopathy. EXAM: MRI LUMBAR SPINE WITHOUT CONTRAST TECHNIQUE: Multiplanar, multisequence MR imaging of the lumbar spine was performed. No intravenous contrast was administered. COMPARISON:  None. FINDINGS: Segmentation: Lumbar segmentation appears to be normal and will be designated as such for this report. Alignment: Preserved lumbar lordosis. No significant spondylolisthesis or scoliosis. Vertebrae: No marrow edema or evidence of acute osseous abnormality. Mild degenerative endplate marrow signal changes primarily at L3. Background bone marrow signal within normal limits. Occasional benign vertebral body hemangiomas. Intact visible sacrum and SI joints. Conus medullaris and cauda equina: Conus extends to the T12-L1 level. No lower spinal cord or conus signal abnormality. Fairly capacious spinal canal. Normal cauda equina nerve roots. Paraspinal and other soft tissues: Polycystic renal changes bilaterally. Otherwise negative visible abdominal viscera. Somewhat featureless appearance of the distal colon in the pelvis. Partially visible distended urinary bladder. Disc levels: T11-T12: Negative. T12-L1:  Negative. L1-L2:  Negative. L2-L3:  Minor disc desiccation and disc bulging.  No stenosis. L3-L4: Disc desiccation and disc space loss. Probable trace vacuum disc. Mild circumferential disc bulge. Mild facet  and ligament flavum hypertrophy. Trace degenerative facet joint fluid. But no convincing spinal stenosis or neural impingement at this level. L4-L5: Disc  desiccation, mild disc bulge, posterior element hypertrophy. Trace degenerative facet joint fluid. No convincing stenosis. L5-S1: Mild disc desiccation with a broad-based central disc protrusion best seen on series 8, image 31 and series 5, image 10. Mild to moderate facet hypertrophy, but capacious spinal canal with no spinal or lateral recess stenosis. No foraminal stenosis. IMPRESSION: 1. Mild for age lumbar spine degeneration, L3-L4 through L5-S1. Small disc herniation at the latter but no convincing lumbar spinal stenosis or neural impingement. Normal appearance of the conus and cauda equina nerve roots. 2. Polycystic renal disease. Somewhat featureless appearance of the visible distal colon in the pelvis. Query acute or chronic colitis. Electronically Signed   By: Genevie Ann M.D.   On: 11/25/2021 10:32   DG CHEST PORT 1 VIEW  Result Date: 11/26/2021 CLINICAL DATA:  PICC placement. EXAM: PORTABLE CHEST 1 VIEW COMPARISON:  Radiograph 12/05/2021 FINDINGS: Right upper extremity PICC tip overlies the mid lower SVC. No pneumothorax. Unchanged cardiomegaly. Increasing vascular congestion. Increasing patchy bibasilar opacities. Left costophrenic angle is excluded from the field of view. No large pleural effusion. IMPRESSION: 1. Tip of the right upper extremity PICC in the mid lower SVC. 2. Unchanged cardiomegaly. Increasing vascular congestion. Increasing patchy bibasilar opacities may be atelectasis or basilar edema. Electronically Signed   By: Keith Rake M.D.   On: 11/26/2021 01:58   DG Chest Portable 1 View  Result Date: 11/30/2021 CLINICAL DATA:  Generalized weakness for 1 week, short of breath EXAM: PORTABLE CHEST 1 VIEW COMPARISON:  09/22/2021 FINDINGS: 2 frontal views of the chest demonstrate marked enlargement the cardiac silhouette. Chronic central  vascular prominence without airspace disease, effusion, or pneumothorax. Left costophrenic angle is excluded by collimation. No acute bony abnormalities. IMPRESSION: 1. Enlarged cardiac silhouette. 2. Chronic central vascular congestion without airspace disease. Electronically Signed   By: Randa Ngo M.D.   On: 12/06/2021 21:28   ECHOCARDIOGRAM COMPLETE  Result Date: 11/25/2021    ECHOCARDIOGRAM REPORT   Patient Name:   MERLE Bence Date of Exam: 11/25/2021 Medical Rec #:  [id number]      Height:       69.0 in Accession #:    [id number]     Weight:       295.0 lb Date of Birth:  Jul 13, 1961      BSA:          2.437 m Patient Age:    70 years       BP:           110/73 mmHg Patient Gender: M              HR:           97 bpm. Exam Location:  Inpatient Procedure: 2D Echo, Cardiac Doppler and Color Doppler Indications:     X90.24 Chronic diastolic (congestive) heart failure  History:         Patient has prior history of Echocardiogram examinations, most                  recent 08/16/2021. CHF, Abnormal ECG, COPD,                  Arrythmias:Bradycardia, Signs/Symptoms:Dyspnea and Shortness of                  Breath; Risk Factors:Diabetes and Hypertension. Cor pulmonale.  Sonographer:     Roseanna Rainbow RDCS Referring Phys:  Kila Diagnosing Phys: Jenkins Rouge MD  Sonographer Comments: Technically difficult study  due to poor echo windows and patient is morbidly obese. Image acquisition challenging due to patient body habitus. Patient immobile in bariatric bed. IMPRESSIONS  1. Left ventricular ejection fraction, by estimation, is 60 to 65%. The left ventricle has normal function. The left ventricle has no regional wall motion abnormalities. Left ventricular diastolic parameters are indeterminate.  2. Severe RV failure with both systolic and diastolic flattening of the septum . Right ventricular systolic function is severely reduced. The right ventricular size is severely enlarged. There is severely  elevated pulmonary artery systolic pressure.  3. Right atrial size was severely dilated.  4. The pericardial effusion is posterior to the left ventricle.  5. MR is eccentric and likely moderate range Difficult to characterize given massive RA/RV enlargement . The mitral valve is normal in structure. Moderate mitral valve regurgitation. No evidence of mitral stenosis.  6. Tricuspid valve regurgitation is moderate.  7. The aortic valve is tricuspid. There is mild calcification of the aortic valve. Aortic valve regurgitation is not visualized. Aortic valve sclerosis is present, with no evidence of aortic valve stenosis.  8. Aortic dilatation noted. There is moderate dilatation of the ascending aorta, measuring 44 mm.  9. The inferior vena cava is dilated in size with <50% respiratory variability, suggesting right atrial pressure of 15 mmHg. FINDINGS  Left Ventricle: Left ventricular ejection fraction, by estimation, is 60 to 65%. The left ventricle has normal function. The left ventricle has no regional wall motion abnormalities. The left ventricular internal cavity size was normal in size. There is  no left ventricular hypertrophy. Left ventricular diastolic parameters are indeterminate. Right Ventricle: Severe RV failure with both systolic and diastolic flattening of the septum. The right ventricular size is severely enlarged. No increase in right ventricular wall thickness. Right ventricular systolic function is severely reduced. There  is severely elevated pulmonary artery systolic pressure. The tricuspid regurgitant velocity is 4.42 m/s, and with an assumed right atrial pressure of 20 mmHg, the estimated right ventricular systolic pressure is 62.9 mmHg. Left Atrium: Left atrial size was normal in size. Right Atrium: Right atrial size was severely dilated. Pericardium: There is no evidence of pericardial effusion. The pericardial effusion is posterior to the left ventricle. Mitral Valve: MR is eccentric and likely  moderate range Difficult to characterize given massive RA/RV enlargement. The mitral valve is normal in structure. Moderate mitral valve regurgitation. No evidence of mitral valve stenosis. Tricuspid Valve: The tricuspid valve is normal in structure. Tricuspid valve regurgitation is moderate . No evidence of tricuspid stenosis. Aortic Valve: The aortic valve is tricuspid. There is mild calcification of the aortic valve. Aortic valve regurgitation is not visualized. Aortic valve sclerosis is present, with no evidence of aortic valve stenosis. Pulmonic Valve: The pulmonic valve was normal in structure. Pulmonic valve regurgitation is not visualized. No evidence of pulmonic stenosis. Aorta: Aortic dilatation noted. There is moderate dilatation of the ascending aorta, measuring 44 mm. Venous: The inferior vena cava is dilated in size with less than 50% respiratory variability, suggesting right atrial pressure of 15 mmHg. IAS/Shunts: No atrial level shunt detected by color flow Doppler.  LEFT VENTRICLE PLAX 2D LVIDd:         3.40 cm     Diastology LVIDs:         1.90 cm     LV e' medial:    6.45 cm/s LV PW:         1.80 cm     LV E/e' medial:  13.6 LV  IVS:        1.60 cm     LV e' lateral:   6.53 cm/s LVOT diam:     2.50 cm     LV E/e' lateral: 13.4 LV SV:         54 LV SV Index:   22 LVOT Area:     4.91 cm  LV Volumes (MOD) LV vol d, MOD A4C: 39.7 ml LV vol s, MOD A4C: 10.3 ml LV SV MOD A4C:     39.7 ml RIGHT VENTRICLE            IVC RV S prime:     9.15 cm/s  IVC diam: 2.50 cm TAPSE (M-mode): 0.6 cm LEFT ATRIUM             Index        RIGHT ATRIUM           Index LA diam:        5.30 cm 2.17 cm/m   RA Area:     49.80 cm LA Vol (A2C):   57.9 ml 23.76 ml/m  RA Volume:   214.00 ml 87.81 ml/m LA Vol (A4C):   31.5 ml 12.93 ml/m LA Biplane Vol: 39.8 ml 16.33 ml/m  AORTIC VALVE LVOT Vmax:   79.90 cm/s LVOT Vmean:  49.300 cm/s LVOT VTI:    0.110 m  AORTA Ao Root diam: 3.60 cm Ao Asc diam:  4.40 cm MITRAL VALVE                   TRICUSPID VALVE MV Area (PHT): 4.60 cm       TR Peak grad:   78.1 mmHg MV Decel Time: 165 msec       TR Vmax:        442.00 cm/s MR Peak grad:    86.5 mmHg MR Mean grad:    48.0 mmHg    SHUNTS MR Vmax:         465.00 cm/s  Systemic VTI:  0.11 m MR Vmean:        296.0 cm/s   Systemic Diam: 2.50 cm MR PISA:         5.09 cm MR PISA Eff ROA: 31 mm MR PISA Radius:  0.90 cm MV E velocity: 87.50 cm/s MV A velocity: 65.50 cm/s MV E/A ratio:  1.34 Jenkins Rouge MD Electronically signed by Jenkins Rouge MD Signature Date/Time: 11/25/2021/3:47:55 PM    Final (Updated)    [place] EKG SITE RITE  Result Date: 11/25/2021 If Site Rite image not attached, placement could not be confirmed due to current cardiac rhythm.  US Abdomen Limited RUQ (LIVER/GB)  Result Date: 11/25/2021 CLINICAL DATA:  Abdominal pain. EXAM: ULTRASOUND ABDOMEN LIMITED RIGHT UPPER QUADRANT COMPARISON:  07/20/2021. FINDINGS: Gallbladder: Multiple stones are present in the gallbladder. There is no gallbladder wall thickening or pericholecystic fluid. No sonographic Murphy sign noted by sonographer. Common bile duct: Diameter: 5.1 mm.  No intrahepatic biliary ductal dilatation. Liver: No focal lesion identified. The liver has a nodular contour, suggesting underlying cirrhosis. Within normal limits in parenchymal echogenicity. Portal vein is patent on color Doppler imaging with normal direction of blood flow towards the liver. Other: No free fluid. IMPRESSION: 1. Cholelithiasis without acute cholecystitis. 2. Nodular contour of the liver suggesting underlying cirrhosis. Electronically Signed   By: Brett Fairy M.D.   On: 11/25/2021 03:40    Microbiology Recent Results (from the past 240 hour(s))  Resp Panel by RT-PCR (Flu A&B, Covid)  Status: None   Collection Time: 11/25/2021 11:50 PM   Specimen: Nasopharyngeal(NP) swabs in vial transport medium  Result Value Ref Range Status   SARS Coronavirus 2 by RT PCR NEGATIVE NEGATIVE Final    Comment:  (NOTE) SARS-CoV-2 target nucleic acids are NOT DETECTED.  The SARS-CoV-2 RNA is generally detectable in upper respiratory specimens during the acute phase of infection. The lowest concentration of SARS-CoV-2 viral copies this assay can detect is 138 copies/mL. A negative result does not preclude SARS-Cov-2 infection and should not be used as the sole basis for treatment or other patient management decisions. A negative result may occur with  improper specimen collection/handling, submission of specimen other than nasopharyngeal swab, presence of viral mutation(s) within the areas targeted by this assay, and inadequate number of viral copies(<138 copies/mL). A negative result must be combined with clinical observations, patient history, and epidemiological information. The expected result is Negative.  Fact Sheet for Patients:  EntrepreneurPulse.com.au  Fact Sheet for Healthcare Providers:  IncredibleEmployment.be  This test is no t yet approved or cleared by the [place] FDA and  has been authorized for detection and/or diagnosis of SARS-CoV-2 by FDA under an Emergency Use Authorization (EUA). This EUA will remain  in effect (meaning this test can be used) for the duration of the COVID-19 declaration under Section 564(b)(1) of the Act, 21 U.S.C.section 360bbb-3(b)(1), unless the authorization is terminated  or revoked sooner.       Influenza A by PCR NEGATIVE NEGATIVE Final   Influenza B by PCR NEGATIVE NEGATIVE Final    Comment: (NOTE) The Xpert Xpress SARS-CoV-2/FLU/RSV plus assay is intended as an aid in the diagnosis of influenza from Nasopharyngeal swab specimens and should not be used as a sole basis for treatment. Nasal washings and aspirates are unacceptable for Xpert Xpress SARS-CoV-2/FLU/RSV testing.  Fact Sheet for Patients: EntrepreneurPulse.com.au  Fact Sheet for Healthcare  Providers: IncredibleEmployment.be  This test is not yet approved or cleared by the [place] FDA and has been authorized for detection and/or diagnosis of SARS-CoV-2 by FDA under an Emergency Use Authorization (EUA). This EUA will remain in effect (meaning this test can be used) for the duration of the COVID-19 declaration under Section 564(b)(1) of the Act, 21 U.S.C. section 360bbb-3(b)(1), unless the authorization is terminated or revoked.  Performed at [facility], [place] [address], [place],  [address]   Gastrointestinal Panel by PCR , Stool     Status: None   Collection Time: 11/25/21 11:11 AM   Specimen: Stool  Result Value Ref Range Status   Campylobacter species NOT DETECTED NOT DETECTED Final   Plesimonas shigelloides NOT DETECTED NOT DETECTED Final   Salmonella species NOT DETECTED NOT DETECTED Final   Yersinia enterocolitica NOT DETECTED NOT DETECTED Final   Vibrio species NOT DETECTED NOT DETECTED Final   Vibrio cholerae NOT DETECTED NOT DETECTED Final   Enteroaggregative E coli (EAEC) NOT DETECTED NOT DETECTED Final   Enteropathogenic E coli (EPEC) NOT DETECTED NOT DETECTED Final   Enterotoxigenic E coli (ETEC) NOT DETECTED NOT DETECTED Final   Shiga like toxin producing E coli (STEC) NOT DETECTED NOT DETECTED Final   Shigella/Enteroinvasive E coli (EIEC) NOT DETECTED NOT DETECTED Final   Cryptosporidium NOT DETECTED NOT DETECTED Final   Cyclospora cayetanensis NOT DETECTED NOT DETECTED Final   Entamoeba histolytica NOT DETECTED NOT DETECTED Final   Giardia lamblia NOT DETECTED NOT DETECTED Final   Adenovirus F40/41 NOT DETECTED NOT DETECTED Final   Astrovirus NOT  DETECTED NOT DETECTED Final   Norovirus GI/GII NOT DETECTED NOT DETECTED Final   Rotavirus A NOT DETECTED NOT DETECTED Final   Sapovirus (I, II, IV, and V) NOT DETECTED NOT DETECTED Final    Comment: Performed at [facility], [address] [place], [place]  [address]  MRSA Next Gen by PCR, Nasal     Status: None   Collection Time: 11/26/21  5:04 PM   Specimen: Nasal Mucosa; Nasal Swab  Result Value Ref Range Status   MRSA by PCR Next Gen NOT DETECTED NOT DETECTED Final    Comment: (NOTE) The GeneXpert MRSA Assay (FDA approved for NASAL specimens only), is one component of a comprehensive MRSA colonization surveillance program. It is not intended to diagnose MRSA infection nor to guide or monitor treatment for MRSA infections. Test performance is not FDA approved in patients less than 59 years old. Performed at [facility], [place] [address] [place], [place] [address]   Culture, blood (routine x 2)     Status: None (Preliminary result)   Collection Time: 11/26/21  5:19 PM   Specimen: BLOOD LEFT HAND  Result Value Ref Range Status   Specimen Description BLOOD LEFT HAND  Final   Special Requests   Final    BOTTLES DRAWN AEROBIC ONLY Blood Culture results may not be optimal due to an inadequate volume of blood received in culture bottles   Culture   Final    NO GROWTH 4 DAYS Performed at [facility], [place] [address], [place], [place] [address]    Report Status PENDING  Incomplete  Culture, blood (routine x 2)     Status: None (Preliminary result)   Collection Time: 11/26/21  5:34 PM   Specimen: BLOOD LEFT HAND  Result Value Ref Range Status   Specimen Description BLOOD LEFT HAND  Final   Special Requests   Final    BOTTLES DRAWN AEROBIC ONLY Blood Culture results may not be optimal due to an inadequate volume of blood received in culture bottles   Culture   Final    NO GROWTH 4 DAYS Performed at [facility], [place] [address], [place], [place] [address]    Report Status PENDING  Incomplete    Lab Basic Metabolic Panel: Recent Labs  Lab 12/23/2021 1347 12/08/2021 2152 11/25/21 0536 11/26/21 0505 11/26/21 2252 2021-12-16 0445  NA 129*  --  127* 130* 122* 124*  K 3.5  --  3.9 3.7 3.9 3.6  CL 88*  --  89* 86*  --  85*   CO2 23  --  19* 24  --  21*  GLUCOSE 99  --  106* 125*  --  240*  BUN 86*  --  93* 104*  --  108*  CREATININE 3.10*  --  3.23* 3.64*  --  3.76*  CALCIUM 9.0  --  8.6* 8.4*  --  8.0*  MG  --  1.2* 1.3* 1.6*  --  2.0  PHOS  --   --   --  4.6  --   --    Liver Function Tests: Recent Labs  Lab 12/19/2021 2152 11/25/21 0536 11/26/21 0505 Dec 16, 2021 0445  AST 109* 95* 86* 74*  ALT 73* 67* 63* 58*  ALKPHOS 119 105 100 97  BILITOT 4.0* 4.5* 4.7* 5.7*  PROT 7.3 6.5 6.0* 5.8*  ALBUMIN 3.9 3.5 3.0* 2.8*   Recent Labs  Lab 12/16/2021 2152  LIPASE 28   Recent Labs  Lab 12/17/2021 2154  AMMONIA 47*   CBC: Recent Labs  Lab 11/23/2021 1347 11/25/21 0536 11/26/21 1247 11/26/21 2252 12-12-21 0445  WBC 6.9 6.7 6.8  --  8.2  NEUTROABS  --  5.0  --   --   --   HGB 15.2 15.2 11.9* 13.9 12.2*  HCT 44.8 45.3 36.0* 41.0 36.5*  MCV 92.8 93.8 93.0  --  91.7  PLT 115* 100* 97*  --  106*   Cardiac Enzymes: No results for input(s): CKTOTAL, CKMB, CKMBINDEX, TROPONINI in the last 168 hours. Sepsis Labs: Recent Labs  Lab 12/09/2021 1347 12/08/2021 2314 11/25/21 0536 11/26/21 1247 11/26/21 1341 11/26/21 1706 December 12, 2021 0445  PROCALCITON  --   --   --  6.14  --   --   --   WBC 6.9  --  6.7 6.8  --   --  8.2  LATICACIDVEN  --    < > 2.4* 1.8 1.7 1.9  --    < > = values in this interval not displayed.      Candee Furbish 11/30/2021, 5:26 PM

## 2021-12-24 NOTE — Progress Notes (Signed)
OT Cancellation Note ? ?Patient Details ?Name: Roberto [name] ?MRN: [id number] ?DOB: 11-18-1960 ? ? ?Cancelled Treatment:    Reason Eval/Treat Not Completed: Medical issues which prohibited therapy.  Events noted.  Will reattempt as medically appropriate. ? ?Jolonda Gomm C., OTR/L ?Acute Rehabilitation Services ?Pager [phone] ?Office [phone] ? ? ?[name] ?December 04, 2021, 8:45 AM ?

## 2021-12-24 NOTE — Progress Notes (Signed)
? ?  NAME:  Roberto [name], MRN:  [id number], DOB:  1961/06/05, LOS: 2 ?ADMISSION DATE:  11/25/2021, CONSULTATION DATE:  11/26/21 ?REFERRING MD:  CHF team, CHIEF COMPLAINT:  FTT  ? ?History of Present Illness:  ?61 year old man with hx of end stage multifactorial cor pulmonale (chronic hypoxemia, OSA on CPAP, OHS, PE, mitral regurgitation) who presented with weakness, diarrhea, failure to thrive found to be in decompensated RV failure. ? ?Attempted to diurese with inotropes and lasix but unfortunately not much progress, worsening BUN/Cr, and Bps are dropping.  Lengthy conversations with family by CHF team but patient would like continued aggressive care.  He is currently lethargic and unable to provide much history. ? ?To be transported to CCU with PCCM taking over as primary. ? ?Pertinent  Medical History  ?CKD3 ?COPD ?HTN ?DVT/PE on warfarin ?OSA on CPAP ?DM2 ?Cor pulmonale ? ?Significant Hospital Events: ?Including procedures, antibiotic start and stop dates in addition to other pertinent events   ?3/3 admitted ?3/4 deterioration ? ?Interim History / Subjective:  ?Agitated this AM. ?Remains on pressors. ?Denies pain or breathlessness. ? ?Objective   ?Blood pressure 90/74, pulse 93, temperature 98.3 ?F (36.8 ?C), resp. rate 15, height _0  (1.753 m), weight 132.9 kg, SpO2 92 %. ?CVP:  [5 mmHg-23 mmHg] 15 mmHg  ?FiO2 (%):  [80 %] 80 %  ? ?Intake/Output Summary (Last 24 hours) at 2021-11-29 0919 ?Last data filed at 2021/11/29 0444 ?Gross per 24 hour  ?Intake 1892.08 ml  ?Output 779 ml  ?Net 1113.08 ml  ? ? ?Filed Weights  ? 11/26/21 1400 11/26/21 2230 November 29, 2021 0500  ?Weight: (S) (!) 165 kg 132.9 kg 132.9 kg  ? ? ?Examination: ?Ill appearing man trying to get out of bed ?MM dry, Trachea midline ?Breath sounds diminished at bases ?+SEM, +edema ?Seems to be oriented but very restless ? ?Bili up ?Na down ?Renal function worsening ?CBC stable ? ?Resolved Hospital Problem list   ?N/A ? ?Assessment & Plan:  ?Acute decompensated  multifactorial cor pulmonale with cardiogenic shock, cardiorenal syndrome. ? ?Severe pulm HTN PVR 6.6 with history details extensively by Dr. Christa See at [facility] on 10/12/21 ? ?Shock liver, coagulopathy related to warfarin use and shock liver ? ?T2DM, OSA on CPAP, HTN, COPD (although PFTs relatively preserved 1/18), DVT/PE, CKD, chronic AF (hx of cardioversion)  ? ?- Continue pressors titrated to MAP 65 ?- Family meeting noon today as patient has reached terminal decline with HD not being a viable option ?- can try haldol to help his agitation ? ?Best Practice (right click and "Reselect all SmartList Selections" daily)  ? ?Diet/type: renal ?DVT prophylaxis: Coumadin ?GI prophylaxis: N/A ?Lines: Central line ?Foley:  Yes, and it is still needed ?Code Status:  full code ?Last date of multidisciplinary goals of care discussion [today] ? ? ?Patient critically ill due to shock ?Interventions to address this today pressor titration, family discussions ?Risk of deterioration without these interventions is high ? ?I personally spent 31 minutes providing critical care not including any separately billable procedures ? ?Erskine Emery MD ?[facility]monary Critical Care ? ?Prefer epic messenger for cross cover needs ?If after hours, please call E-link ? ?

## 2021-12-24 NOTE — Progress Notes (Signed)
Comfort measures begun after okay from patient's daughter. See MAR for details.  ?

## 2021-12-24 NOTE — Progress Notes (Signed)
RT NOTES: A-line not drawing back, no waveform. Looks as if patient has pulled the catheter. A-line removed per Dr Tamala Julian.  ?

## 2021-12-24 NOTE — Progress Notes (Signed)
Admit: 12/12/2021 ?LOS: 2 ? ?47M progressive AKI 2/2 cardiorenal syndrome failure, acute decompensated RV failure in shock ? ?Subjective:  ?2 pressors and inotrope ?UOP 0.8L ? SCr 3.9, increasing, hyponatremia to 124 ?Family meeting today 12pm ?He acknolwedged today my recommendation not to do any form of dialysis; doesn't object ? ?03/04 0701 - 03/05 0700 ?In: 2132.1 [P.O.:897; I.V.:1154.4; IV Piggyback:80.7] ?Out: 276 [DYJWL:295] ? ?Filed Weights  ? 11/26/21 1400 11/26/21 2230 December 03, 2021 0500  ?Weight: (S) (!) 165 kg 132.9 kg 132.9 kg  ? ? ?Scheduled Meds: ? Chlorhexidine Gluconate Cloth  6 each Topical Daily  ? gabapentin  200 mg Oral BID  ? haloperidol lactate      ? haloperidol lactate  5 mg Intravenous Once  ? insulin aspart  0-9 Units Subcutaneous TID WC  ? sodium chloride flush  10-40 mL Intracatheter Q12H  ? ?Continuous Infusions: ? sodium chloride    ? DOBUTamine 2.5 mcg/kg/min (12-03-2021 0400)  ? norepinephrine (LEVOPHED) Adult infusion 40 mcg/min (December 03, 2021 0819)  ? vasopressin 0.03 Units/min (Dec 03, 2021 0701)  ? ?PRN Meds:.Place/Maintain arterial line **AND** sodium chloride, sodium chloride flush ? ?Current Labs: reviewed  ? ? ?Physical Exam:  Blood pressure 90/74, pulse 93, temperature 98.3 ?F (36.8 ?C), resp. rate 15, height _0  (1.753 m), weight 132.9 kg, SpO2 92 %. ?GEN: ill appearing ?ENT: NCAT ?EYES: EOMI ?CV: Regular  ?PULM: Coarse bs b/l ?ABD: hypoactive, protuberant ?SKIN: 1+ LEE, chronic venous changes ?Nonfocal, moves all extremites ? ?A ?AKI, cardiorenal 2/2 #2; oliguric ?Decompensated RV failure 2/2 severe end stage pulmonary hypertension; cardiogenic shock ?Congestive hepatopathy ?Hyponatremia 2/2 #2 ? ?P ?He is not a candidate for any form of RRT including CRRT given problem #2 ?He does not object to this recommendation and family meeting is today at 12pm ?Cont supporitive care ?Anticipate transition to comfort measures ? ?Pearson Grippe MD ?12-03-2021, 8:53 AM ? ?Recent Labs  ?Lab 11/25/21 ?7473  11/26/21 ?0505 11/26/21 ?2252 12/03/2021 ?0445  ?NA 127* 130* 122* 124*  ?K 3.9 3.7 3.9 3.6  ?CL 89* 86*  --  85*  ?CO2 19* 24  --  21*  ?GLUCOSE 106* 125*  --  240*  ?BUN 93* 104*  --  108*  ?CREATININE 3.23* 3.64*  --  3.76*  ?CALCIUM 8.6* 8.4*  --  8.0*  ?PHOS  --  4.6  --   --   ? ?Recent Labs  ?Lab 11/25/21 ?4037 11/26/21 ?1247 11/26/21 ?2252 12-03-2021 ?0445  ?WBC 6.7 6.8  --  8.2  ?NEUTROABS 5.0  --   --   --   ?HGB 15.2 11.9* 13.9 12.2*  ?HCT 45.3 36.0* 41.0 36.5*  ?MCV 93.8 93.0  --  91.7  ?PLT 100* 97*  --  106*  ? ? ? ? ? ? ? ? ? ?  ?

## 2021-12-24 NOTE — Plan of Care (Addendum)
?  Addendum: ?Plan to transition to comfort care 2021/12/10 afternoon when family is ready -- a few local family members would like to come to the hospital to visit prior to weaning pressors and extubation  ?Until that time,  ?No uptitration of pressors ?No labs, imaging  ?DNR ? ? ?Interdisciplinary Goals of Care Family Meeting ? ? ?Date carried out: 2021-12-10 ? ?Location of the meeting: Bedside ? ?Member's involved: Nurse Practitioner and Family Member or next of kin ? ?Durable Power of Attorney or acting medical decision maker: Daughter [name], Roberto [name]'s husband Roberto [name], and [name] (via phone)    ? ?Discussion: We discussed goals of care for Roberto [name]. Discussed end stage RV failure, severe PAH, and progressive shock with multisystem organ failure. ?Compassionately shared that the patient is actively dying and likelihood of dying even with support of current measures. Discussed poor utility of measures such as CPR in response to cardiac arrest. Recommended transition to comfort care.  ? ?Sister Roberto [name] was shocked by this news and asked for Roberto [name] to continue talking with me, and call her back.  ? ?Daughter [name] is in agreement with DNR code status and transition to comfort care, but wants to speak with Roberto [name] regarding timing of transition to comfort care. ? ?We plan to discuss timing after Roberto [name] and Roberto [name] talk again, and ended our meeting with prayerful support for Roberto [name] and his family. ? ?Will place consult for spiritual care to visit with family. ? ? ?Code status: Full DNR ? ?Disposition: Continue current acute care -- anticipate upcoming transition to comfort care. ? ?Time spent for the meeting: 30 min ? ? ? ?Cristal Generous, NP ?10-Dec-2021, 1:07 PM ?  ?

## 2021-12-24 DEATH — deceased

## 2022-01-09 ENCOUNTER — Ambulatory Visit: Payer: Medicare HMO | Admitting: Internal Medicine

## 2022-01-10 ENCOUNTER — Ambulatory Visit: Payer: Medicare HMO | Admitting: Cardiology
